# Patient Record
Sex: Female | Born: 1965 | Race: White | Marital: Married
Health system: Southern US, Community
[De-identification: ages and names within clinical notes are randomized; demographics above are authoritative.]

## PROBLEM LIST (undated history)

## (undated) ENCOUNTER — Inpatient Hospital Stay: Admission: EM | Payer: Self-pay | Source: Home / Self Care

## (undated) DIAGNOSIS — M1990 Unspecified osteoarthritis, unspecified site: Secondary | ICD-10-CM

## (undated) DIAGNOSIS — E0500 Thyrotoxicosis with diffuse goiter without thyrotoxic crisis or storm: Secondary | ICD-10-CM

## (undated) DIAGNOSIS — R112 Nausea with vomiting, unspecified: Secondary | ICD-10-CM

## (undated) DIAGNOSIS — F1120 Opioid dependence, uncomplicated: Secondary | ICD-10-CM

## (undated) DIAGNOSIS — G43909 Migraine, unspecified, not intractable, without status migrainosus: Secondary | ICD-10-CM

## (undated) DIAGNOSIS — G709 Myoneural disorder, unspecified: Secondary | ICD-10-CM

## (undated) DIAGNOSIS — F1910 Other psychoactive substance abuse, uncomplicated: Secondary | ICD-10-CM

## (undated) DIAGNOSIS — Z9889 Other specified postprocedural states: Secondary | ICD-10-CM

## (undated) DIAGNOSIS — F1190 Opioid use, unspecified, uncomplicated: Secondary | ICD-10-CM

## (undated) DIAGNOSIS — G8929 Other chronic pain: Secondary | ICD-10-CM

## (undated) DIAGNOSIS — F329 Major depressive disorder, single episode, unspecified: Secondary | ICD-10-CM

## (undated) DIAGNOSIS — C159 Malignant neoplasm of esophagus, unspecified: Secondary | ICD-10-CM

## (undated) DIAGNOSIS — K859 Acute pancreatitis without necrosis or infection, unspecified: Secondary | ICD-10-CM

## (undated) DIAGNOSIS — F32A Depression, unspecified: Secondary | ICD-10-CM

## (undated) DIAGNOSIS — G35 Multiple sclerosis: Secondary | ICD-10-CM

## (undated) DIAGNOSIS — F4310 Post-traumatic stress disorder, unspecified: Secondary | ICD-10-CM

## (undated) DIAGNOSIS — F419 Anxiety disorder, unspecified: Secondary | ICD-10-CM

## (undated) DIAGNOSIS — K589 Irritable bowel syndrome without diarrhea: Secondary | ICD-10-CM

## (undated) HISTORY — DX: Other psychoactive substance abuse, uncomplicated: F19.10

## (undated) HISTORY — PX: HERNIA REPAIR: SHX51

## (undated) HISTORY — DX: Myoneural disorder, unspecified: G70.9

## (undated) HISTORY — PX: CHOLECYSTECTOMY: SHX55

## (undated) HISTORY — PX: SPHINCTEROTOMY: SHX5279

## (undated) NOTE — Progress Notes (Signed)
Formatting of this note is different from the original.  [facility]     Referring Physician: [name]    Date of Consultation: 11/26/2018    Chief Complaint   Patient presents with   ? Follow-up     LOV 11/05/18. F/u to testing- Echo 11/21/2018, MoMe finished 11/25/2018. Pt is asymtomatic of CP.      Assessment and Plan     1. Precordial pain, atypical   Echocardiogram was negative for structural heart disease  Likely non cardiac , resolved   CT calcium score was ordered for risk stratification, not done   Consider stress test if symptoms worsen     2. Palpitations   .  Consider beta blockers/ ILR if symptoms escalate   .  Defer thyroid / obstructive sleep apnea workup to PCP  .  Holter monitor was negative for significant tachyarrhythmias     3. Obesity BMI >30  Encouraged weight loss and increased exercise.   Likely OSA ; respectfully deferred to PCP     4. HTN   Salt cessation discussed   Would advise lisinopril 10 mg   Goal SBP less than 130     5. Familial hyperlipidemia  Premature events in her father   LDL 181  ; no personal vascular events or tendon xanthomas  Goal LDL less than 70 ; likely familial hyperlipidemia  Homozygous     6. Labs   Chem 7 / lft with Dr Addison Bailey 1 month   Lipids with Dr Addison Bailey 3 months     Previous Cardiovascular Testing     Echocardiogram  11/21/18  1. Estimated ejection fraction 60-65%.  2. Normal right ventricular systolic function.  3. Trace to mild mitral regurgitation.  4. Trace to mild tricuspid regurgitation.  5. Normal RVSP    Holter 1/20   The patient was monitored by way of Mobile Cardiac Telemetry for a period of 7 days to assess Precordial Pain. Throughout the monitoring period the patient exhibited Sinus Rhythm. The patient activated the symptoms button 8 times, however, no symptoms diary was given. Symptoms correlated with Sinus Rhythm and Sinus Tachycardia. The minimum/average/maximum heart rates were 58/80/139 BPM. Total PVC Burden was  <1%.    History of Present Illness     59 yr old with considerable anxiety , HTN , atypical CP , LDL 181 obesity     Testing discussed at length     .  At baseline she has a reasonably good exercise tolerance,, however she is not as active as she was last year; NYHA functional class 1-11  With occasional shortness of breath    .  Recent ER visit with chest pain ; left-sided with some radiation down the arm and, nonexertional, pain lasts for hours ; s likely musculoskeletal pain    Father MI at 57 he was a smoker ; GM a fib     Never smoked     Weight : gained 10 lbs      EKG sinus rsr v1 v2 short pr no ST depressions    Chest PA 10/26/18:  No plain film evidence of acute cardiopulmonary disease.     Past Medical History     History reviewed. No pertinent past medical history.  Past Surgical History     History reviewed. No pertinent surgical history.  Social History     Social History     Tobacco Use   ? Smoking status: Never Smoker   ? Smokeless tobacco:  Never Used   Substance Use Topics   ? Alcohol use: Yes     Comment: socially   ? Drug use: Never     Family History     Family History   Problem Relation Age of Onset   ? Hypertension Mother    ? Hyperlipidemia Mother    ? Immunodeficiency Mother    ? Heart attack Father 50        & 44   ? Coronary artery disease Father    ? Hypertension Father    ? Coronary artery disease Maternal Grandfather    ? Heart attack Maternal Grandfather    ? Stroke Neg Hx      Allergies     No Known Allergies  Medications     Current Outpatient Medications   Medication Sig Dispense Refill   ? ALPRAZolam (XANAX) 0.5 mg tablet Take 0.5 mg by mouth as needed.     ? citalopram hydrobromide (CELEXA) 20 mg tablet Take 20 mg by mouth 2 (two) times daily.     ? Multiple Vitamin (MULTIVITAMIN) tablet Take 1 tablet by mouth daily.     ? lisinopril (PRINIVIL,ZESTRIL) 10 mg tablet Take one tablet (10 mg dose) by mouth daily. 30 tablet 5   ? rosuvastatin calcium (CRESTOR) 20 mg tablet Take one  tablet (20 mg dose) by mouth at bedtime. 30 tablet 5     No current facility-administered medications for this visit.      Review of Systems     General: Not Present- Fatigue, Weight Gain and Weight Loss.  Respiratory: Not Present- Bloody sputum, Cough, and Wheezing.  Cardiovascular: Not Present- Calf, thigh or buttock pain with walking, Chest Pain, Difficulty Breathing Lying Down, Difficulty Breathing On Exertion, Edema, Awakening Short of Breath and Palpitations.  Gastrointestinal: Not Present- Bloody Stool, Constipation, Diarrhea, Hematemesis, Indigestion, Nausea and Vomiting.  Neurological: Not Present- Dizziness, Syncope and Weakness.  Musculoskeletal:  No arthritic symptoms  Genitourinary: Negative for dysuria    Otherwise 10 point review of systems is negative.    Physical Exam     Vitals:    11/26/18 0959   BP: 140/90   BP Location: Left arm   Patient Position: Sitting   Pulse: 68   Weight: 203 lb (92.1 kg)   Height: 5\' 5"  (1.651 m)     Body mass index is 33.78 kg/m.    General:  Patient appears their stated age, well-nourished.  Alert and in no apparent distress.  Eyes: No conjunctivitis, no purulent discharge, no lid lag  ENT:  Hearing grossly intact, Nares patent bilaterally, Lips moist, color appropriate for race.  Respiratory: Clear to auscultation throughout. Respiratory effort unlabored, chest expansion symmetric.    Cardio: Regular rate and rhythm. Normal S1/S2 No carotid bruits, no JVD.   Extremities: warm, pulses 2+, no peripheral edema  GI: Soft, nondistended, nontender.  No guarding or rebound.  Skin: Color appropriate for race, Skin warm, dry, and intact  Psychiatric: Good insight and judgment, oriented to person, place, and time    Labs     Lipid Panel No results found for: CHOL, TRIG, HDL  CBC No results found for: WBC, HGB, HCT, MCV, PLT   BMP: No results found for: NA, K, CL, CO2, BUN, GLU  INR No results found for: INR, PROTIME     EKG     I have personally reviewed and interpreted the  resting electrocardiogram.     Follow-Up     Follow up in  about 1 year (around 11/27/2019).    Warmest Regards,  Higinio Roger, MD  [facility]  Electronically signed by Higinio Roger, MD at 11/26/2018 10:34 AM EST

---

## 1998-05-08 ENCOUNTER — Emergency Department (HOSPITAL_COMMUNITY): Admission: EM | Admit: 1998-05-08 | Discharge: 1998-05-08 | Payer: Self-pay | Admitting: Emergency Medicine

## 1998-06-17 ENCOUNTER — Ambulatory Visit (HOSPITAL_COMMUNITY): Admission: RE | Admit: 1998-06-17 | Discharge: 1998-06-17 | Payer: Self-pay | Admitting: Neurology

## 1998-10-26 ENCOUNTER — Ambulatory Visit (HOSPITAL_COMMUNITY): Admission: RE | Admit: 1998-10-26 | Discharge: 1998-10-26 | Payer: Self-pay | Admitting: Obstetrics and Gynecology

## 1998-10-26 ENCOUNTER — Emergency Department (HOSPITAL_COMMUNITY): Admission: EM | Admit: 1998-10-26 | Discharge: 1998-10-26 | Payer: Self-pay | Admitting: Emergency Medicine

## 1998-10-27 ENCOUNTER — Ambulatory Visit (HOSPITAL_COMMUNITY): Admission: RE | Admit: 1998-10-27 | Discharge: 1998-10-27 | Payer: Self-pay | Admitting: Emergency Medicine

## 1998-10-27 ENCOUNTER — Encounter: Payer: Self-pay | Admitting: Emergency Medicine

## 1998-11-12 ENCOUNTER — Ambulatory Visit (HOSPITAL_COMMUNITY): Admission: RE | Admit: 1998-11-12 | Discharge: 1998-11-12 | Payer: Self-pay | Admitting: Gastroenterology

## 1998-11-12 ENCOUNTER — Encounter: Payer: Self-pay | Admitting: Gastroenterology

## 1999-05-24 ENCOUNTER — Encounter: Admission: RE | Admit: 1999-05-24 | Discharge: 1999-08-22 | Payer: Self-pay

## 1999-10-07 ENCOUNTER — Encounter: Payer: Self-pay | Admitting: *Deleted

## 1999-10-07 ENCOUNTER — Ambulatory Visit (HOSPITAL_COMMUNITY): Admission: RE | Admit: 1999-10-07 | Discharge: 1999-10-07 | Payer: Self-pay | Admitting: *Deleted

## 1999-10-19 ENCOUNTER — Inpatient Hospital Stay (HOSPITAL_COMMUNITY): Admission: EM | Admit: 1999-10-19 | Discharge: 1999-10-21 | Payer: Self-pay | Admitting: Emergency Medicine

## 1999-10-19 ENCOUNTER — Encounter: Payer: Self-pay | Admitting: Emergency Medicine

## 1999-11-27 ENCOUNTER — Inpatient Hospital Stay
Admission: RE | Admit: 1999-11-27 | Disposition: A | Discharge status: Home / Self Care | Payer: Self-pay | Source: Ambulatory Visit | Admitting: Independent Medical Examiner

## 1999-12-17 ENCOUNTER — Emergency Department (HOSPITAL_COMMUNITY): Admission: EM | Admit: 1999-12-17 | Discharge: 1999-12-17 | Payer: Self-pay | Admitting: Emergency Medicine

## 2000-02-01 ENCOUNTER — Emergency Department (HOSPITAL_COMMUNITY): Admission: EM | Admit: 2000-02-01 | Discharge: 2000-02-01 | Payer: Self-pay | Admitting: Emergency Medicine

## 2000-02-01 ENCOUNTER — Encounter: Payer: Self-pay | Admitting: Emergency Medicine

## 2000-02-03 ENCOUNTER — Emergency Department (HOSPITAL_COMMUNITY): Admission: EM | Admit: 2000-02-03 | Discharge: 2000-02-03 | Payer: Self-pay | Admitting: Emergency Medicine

## 2000-02-03 ENCOUNTER — Encounter: Payer: Self-pay | Admitting: Emergency Medicine

## 2000-02-04 ENCOUNTER — Encounter: Payer: Self-pay | Admitting: Emergency Medicine

## 2000-02-04 ENCOUNTER — Inpatient Hospital Stay (HOSPITAL_COMMUNITY): Admission: EM | Admit: 2000-02-04 | Discharge: 2000-02-09 | Payer: Self-pay | Admitting: Emergency Medicine

## 2000-02-06 ENCOUNTER — Encounter: Payer: Self-pay | Admitting: Gastroenterology

## 2000-02-09 ENCOUNTER — Encounter: Payer: Self-pay | Admitting: Gastroenterology

## 2000-03-13 ENCOUNTER — Encounter: Payer: Self-pay | Admitting: *Deleted

## 2000-03-13 ENCOUNTER — Ambulatory Visit (HOSPITAL_COMMUNITY): Admission: RE | Admit: 2000-03-13 | Discharge: 2000-03-13 | Payer: Self-pay | Admitting: *Deleted

## 2000-03-30 ENCOUNTER — Observation Stay (HOSPITAL_COMMUNITY): Admission: EM | Admit: 2000-03-30 | Discharge: 2000-03-30 | Payer: Self-pay | Admitting: Specialist

## 2001-10-05 ENCOUNTER — Emergency Department (HOSPITAL_COMMUNITY): Admission: EM | Admit: 2001-10-05 | Discharge: 2001-10-06 | Payer: Self-pay | Admitting: *Deleted

## 2001-10-07 ENCOUNTER — Emergency Department (HOSPITAL_COMMUNITY): Admission: EM | Admit: 2001-10-07 | Discharge: 2001-10-07 | Payer: Self-pay | Admitting: Emergency Medicine

## 2001-10-08 ENCOUNTER — Encounter: Payer: Self-pay | Admitting: Emergency Medicine

## 2001-11-13 ENCOUNTER — Encounter: Payer: Self-pay | Admitting: Gastroenterology

## 2001-11-13 ENCOUNTER — Encounter: Admission: RE | Admit: 2001-11-13 | Discharge: 2001-11-13 | Payer: Self-pay | Admitting: Gastroenterology

## 2001-12-18 ENCOUNTER — Emergency Department (HOSPITAL_COMMUNITY): Admission: EM | Admit: 2001-12-18 | Discharge: 2001-12-18 | Payer: Self-pay

## 2002-10-15 ENCOUNTER — Inpatient Hospital Stay
Admission: RE | Admit: 2002-10-15 | Disposition: A | Discharge status: Home / Self Care | Payer: Self-pay | Source: Ambulatory Visit | Admitting: Independent Medical Examiner

## 2003-02-20 ENCOUNTER — Emergency Department (HOSPITAL_COMMUNITY): Admission: EM | Admit: 2003-02-20 | Discharge: 2003-02-20 | Payer: Self-pay

## 2004-02-05 ENCOUNTER — Emergency Department (HOSPITAL_COMMUNITY): Admission: AD | Admit: 2004-02-05 | Discharge: 2004-02-05 | Payer: Self-pay | Admitting: Family Medicine

## 2004-02-05 ENCOUNTER — Emergency Department (HOSPITAL_COMMUNITY): Admission: EM | Admit: 2004-02-05 | Discharge: 2004-02-05 | Payer: Self-pay | Admitting: Emergency Medicine

## 2004-02-28 ENCOUNTER — Emergency Department (HOSPITAL_COMMUNITY): Admission: EM | Admit: 2004-02-28 | Discharge: 2004-02-28 | Payer: Self-pay | Admitting: Emergency Medicine

## 2004-02-29 ENCOUNTER — Emergency Department (HOSPITAL_COMMUNITY): Admission: EM | Admit: 2004-02-29 | Discharge: 2004-02-29 | Payer: Self-pay | Admitting: *Deleted

## 2004-06-09 ENCOUNTER — Emergency Department (HOSPITAL_COMMUNITY): Admission: EM | Admit: 2004-06-09 | Discharge: 2004-06-10 | Payer: Self-pay | Admitting: Emergency Medicine

## 2005-04-18 ENCOUNTER — Emergency Department (HOSPITAL_COMMUNITY): Admission: EM | Admit: 2005-04-18 | Discharge: 2005-04-18 | Payer: Self-pay | Admitting: Family Medicine

## 2005-04-20 ENCOUNTER — Emergency Department (HOSPITAL_COMMUNITY): Admission: EM | Admit: 2005-04-20 | Discharge: 2005-04-20 | Payer: Self-pay | Admitting: Family Medicine

## 2005-04-22 ENCOUNTER — Emergency Department (HOSPITAL_COMMUNITY): Admission: EM | Admit: 2005-04-22 | Discharge: 2005-04-22 | Payer: Self-pay | Admitting: Emergency Medicine

## 2005-04-22 ENCOUNTER — Ambulatory Visit (HOSPITAL_COMMUNITY): Admission: RE | Admit: 2005-04-22 | Discharge: 2005-04-22 | Payer: Self-pay | Admitting: Family Medicine

## 2005-05-04 ENCOUNTER — Emergency Department (HOSPITAL_COMMUNITY): Admission: EM | Admit: 2005-05-04 | Discharge: 2005-05-04 | Payer: Self-pay | Admitting: Emergency Medicine

## 2005-05-13 ENCOUNTER — Emergency Department (HOSPITAL_COMMUNITY): Admission: EM | Admit: 2005-05-13 | Discharge: 2005-05-13 | Payer: Self-pay | Admitting: Emergency Medicine

## 2005-05-16 ENCOUNTER — Emergency Department (HOSPITAL_COMMUNITY): Admission: EM | Admit: 2005-05-16 | Discharge: 2005-05-17 | Payer: Self-pay | Admitting: Emergency Medicine

## 2005-05-22 ENCOUNTER — Emergency Department (HOSPITAL_COMMUNITY): Admission: EM | Admit: 2005-05-22 | Discharge: 2005-05-22 | Payer: Self-pay | Admitting: Family Medicine

## 2005-05-29 ENCOUNTER — Emergency Department (HOSPITAL_COMMUNITY): Admission: EM | Admit: 2005-05-29 | Discharge: 2005-05-29 | Payer: Self-pay | Admitting: Emergency Medicine

## 2005-06-12 ENCOUNTER — Emergency Department (HOSPITAL_COMMUNITY): Admission: EM | Admit: 2005-06-12 | Discharge: 2005-06-12 | Payer: Self-pay | Admitting: Family Medicine

## 2005-06-16 ENCOUNTER — Emergency Department (HOSPITAL_COMMUNITY): Admission: EM | Admit: 2005-06-16 | Discharge: 2005-06-16 | Payer: Self-pay | Admitting: Emergency Medicine

## 2005-06-20 ENCOUNTER — Emergency Department (HOSPITAL_COMMUNITY): Admission: EM | Admit: 2005-06-20 | Discharge: 2005-06-21 | Payer: Self-pay | Admitting: Emergency Medicine

## 2005-06-22 ENCOUNTER — Emergency Department (HOSPITAL_COMMUNITY): Admission: EM | Admit: 2005-06-22 | Discharge: 2005-06-22 | Payer: Self-pay | Admitting: Family Medicine

## 2005-06-26 ENCOUNTER — Encounter: Admission: RE | Admit: 2005-06-26 | Discharge: 2005-06-26 | Payer: Self-pay | Admitting: Gastroenterology

## 2005-06-30 ENCOUNTER — Emergency Department (HOSPITAL_COMMUNITY): Admission: EM | Admit: 2005-06-30 | Discharge: 2005-06-30 | Payer: Self-pay | Admitting: Emergency Medicine

## 2005-07-04 ENCOUNTER — Ambulatory Visit (HOSPITAL_COMMUNITY): Admission: RE | Admit: 2005-07-04 | Discharge: 2005-07-04 | Payer: Self-pay | Admitting: Gastroenterology

## 2005-07-09 ENCOUNTER — Inpatient Hospital Stay (HOSPITAL_COMMUNITY): Admission: EM | Admit: 2005-07-09 | Discharge: 2005-07-10 | Payer: Self-pay | Admitting: Family Medicine

## 2005-07-11 ENCOUNTER — Inpatient Hospital Stay (HOSPITAL_COMMUNITY): Admission: AD | Admit: 2005-07-11 | Discharge: 2005-07-22 | Payer: Self-pay | Admitting: Gastroenterology

## 2005-07-29 ENCOUNTER — Emergency Department (HOSPITAL_COMMUNITY): Admission: EM | Admit: 2005-07-29 | Discharge: 2005-07-29 | Payer: Self-pay | Admitting: Emergency Medicine

## 2005-08-02 ENCOUNTER — Encounter: Admission: RE | Admit: 2005-08-02 | Discharge: 2005-08-02 | Payer: Self-pay | Admitting: Gastroenterology

## 2005-08-07 ENCOUNTER — Emergency Department (HOSPITAL_COMMUNITY): Admission: EM | Admit: 2005-08-07 | Discharge: 2005-08-07 | Payer: Self-pay | Admitting: Family Medicine

## 2005-08-08 ENCOUNTER — Emergency Department (HOSPITAL_COMMUNITY): Admission: EM | Admit: 2005-08-08 | Discharge: 2005-08-09 | Payer: Self-pay | Admitting: Emergency Medicine

## 2005-09-08 ENCOUNTER — Emergency Department (HOSPITAL_COMMUNITY): Admission: EM | Admit: 2005-09-08 | Discharge: 2005-09-08 | Payer: Self-pay | Admitting: Family Medicine

## 2005-09-09 ENCOUNTER — Emergency Department (HOSPITAL_COMMUNITY): Admission: EM | Admit: 2005-09-09 | Discharge: 2005-09-09 | Payer: Self-pay | Admitting: Emergency Medicine

## 2005-10-05 ENCOUNTER — Emergency Department (HOSPITAL_COMMUNITY): Admission: EM | Admit: 2005-10-05 | Discharge: 2005-10-05 | Payer: Self-pay | Admitting: Family Medicine

## 2005-10-10 ENCOUNTER — Emergency Department (HOSPITAL_COMMUNITY): Admission: EM | Admit: 2005-10-10 | Discharge: 2005-10-10 | Payer: Self-pay | Admitting: Emergency Medicine

## 2005-11-03 ENCOUNTER — Emergency Department (HOSPITAL_COMMUNITY): Admission: EM | Admit: 2005-11-03 | Discharge: 2005-11-04 | Payer: Self-pay | Admitting: Emergency Medicine

## 2005-11-06 ENCOUNTER — Emergency Department (HOSPITAL_COMMUNITY): Admission: EM | Admit: 2005-11-06 | Discharge: 2005-11-07 | Payer: Self-pay | Admitting: Emergency Medicine

## 2005-12-15 ENCOUNTER — Encounter: Admission: RE | Admit: 2005-12-15 | Discharge: 2005-12-15 | Payer: Self-pay | Admitting: *Deleted

## 2005-12-29 ENCOUNTER — Encounter: Admission: RE | Admit: 2005-12-29 | Discharge: 2005-12-29 | Payer: Self-pay | Admitting: Internal Medicine

## 2006-02-04 ENCOUNTER — Emergency Department (HOSPITAL_COMMUNITY): Admission: EM | Admit: 2006-02-04 | Discharge: 2006-02-04 | Payer: Self-pay | Admitting: Family Medicine

## 2006-02-19 ENCOUNTER — Ambulatory Visit (HOSPITAL_COMMUNITY): Admission: RE | Admit: 2006-02-19 | Discharge: 2006-02-19 | Payer: Self-pay | Admitting: Anesthesiology

## 2006-02-22 ENCOUNTER — Emergency Department (HOSPITAL_COMMUNITY): Admission: EM | Admit: 2006-02-22 | Discharge: 2006-02-22 | Payer: Self-pay | Admitting: Emergency Medicine

## 2006-02-28 ENCOUNTER — Emergency Department (HOSPITAL_COMMUNITY): Admission: EM | Admit: 2006-02-28 | Discharge: 2006-03-01 | Payer: Self-pay | Admitting: Emergency Medicine

## 2006-03-03 ENCOUNTER — Emergency Department (HOSPITAL_COMMUNITY): Admission: EM | Admit: 2006-03-03 | Discharge: 2006-03-04 | Payer: Self-pay | Admitting: Emergency Medicine

## 2006-03-05 ENCOUNTER — Inpatient Hospital Stay (HOSPITAL_COMMUNITY): Admission: EM | Admit: 2006-03-05 | Discharge: 2006-03-08 | Payer: Self-pay | Admitting: Emergency Medicine

## 2006-03-25 ENCOUNTER — Emergency Department (HOSPITAL_COMMUNITY): Admission: EM | Admit: 2006-03-25 | Discharge: 2006-03-25 | Payer: Self-pay | Admitting: Family Medicine

## 2006-03-27 ENCOUNTER — Emergency Department (HOSPITAL_COMMUNITY): Admission: EM | Admit: 2006-03-27 | Discharge: 2006-03-27 | Payer: Self-pay | Admitting: Family Medicine

## 2006-04-13 ENCOUNTER — Emergency Department (HOSPITAL_COMMUNITY): Admission: EM | Admit: 2006-04-13 | Discharge: 2006-04-14 | Payer: Self-pay | Admitting: Emergency Medicine

## 2006-06-11 ENCOUNTER — Other Ambulatory Visit: Admission: RE | Admit: 2006-06-11 | Discharge: 2006-06-11 | Payer: Self-pay | Admitting: Obstetrics and Gynecology

## 2006-07-28 ENCOUNTER — Emergency Department (HOSPITAL_COMMUNITY): Admission: EM | Admit: 2006-07-28 | Discharge: 2006-07-28 | Payer: Self-pay | Admitting: Family Medicine

## 2006-07-28 ENCOUNTER — Emergency Department (HOSPITAL_COMMUNITY): Admission: EM | Admit: 2006-07-28 | Discharge: 2006-07-29 | Payer: Self-pay | Admitting: Emergency Medicine

## 2006-07-29 ENCOUNTER — Emergency Department (HOSPITAL_COMMUNITY): Admission: EM | Admit: 2006-07-29 | Discharge: 2006-07-30 | Payer: Self-pay | Admitting: Emergency Medicine

## 2006-07-29 ENCOUNTER — Emergency Department (HOSPITAL_COMMUNITY): Admission: EM | Admit: 2006-07-29 | Discharge: 2006-07-29 | Payer: Self-pay | Admitting: Family Medicine

## 2006-09-10 ENCOUNTER — Emergency Department (HOSPITAL_COMMUNITY): Admission: EM | Admit: 2006-09-10 | Discharge: 2006-09-10 | Payer: Self-pay | Admitting: Emergency Medicine

## 2006-11-26 ENCOUNTER — Emergency Department (HOSPITAL_COMMUNITY): Admission: EM | Admit: 2006-11-26 | Discharge: 2006-11-26 | Payer: Self-pay | Admitting: Emergency Medicine

## 2006-12-08 ENCOUNTER — Emergency Department (HOSPITAL_COMMUNITY): Admission: EM | Admit: 2006-12-08 | Discharge: 2006-12-08 | Payer: Self-pay | Admitting: Emergency Medicine

## 2006-12-08 ENCOUNTER — Emergency Department (HOSPITAL_COMMUNITY): Admission: EM | Admit: 2006-12-08 | Discharge: 2006-12-09 | Payer: Self-pay | Admitting: Emergency Medicine

## 2006-12-28 ENCOUNTER — Emergency Department (HOSPITAL_COMMUNITY): Admission: EM | Admit: 2006-12-28 | Discharge: 2006-12-28 | Payer: Self-pay | Admitting: Emergency Medicine

## 2007-01-05 ENCOUNTER — Emergency Department (HOSPITAL_COMMUNITY): Admission: EM | Admit: 2007-01-05 | Discharge: 2007-01-05 | Payer: Self-pay | Admitting: Emergency Medicine

## 2007-01-09 ENCOUNTER — Inpatient Hospital Stay (HOSPITAL_COMMUNITY): Admission: AD | Admit: 2007-01-09 | Discharge: 2007-01-16 | Payer: Self-pay | Admitting: Gastroenterology

## 2007-01-21 ENCOUNTER — Ambulatory Visit: Payer: Self-pay | Admitting: Internal Medicine

## 2007-02-19 ENCOUNTER — Emergency Department (HOSPITAL_COMMUNITY): Admission: EM | Admit: 2007-02-19 | Discharge: 2007-02-20 | Payer: Self-pay | Admitting: Emergency Medicine

## 2007-03-12 ENCOUNTER — Ambulatory Visit (HOSPITAL_COMMUNITY): Admission: RE | Admit: 2007-03-12 | Discharge: 2007-03-12 | Payer: Self-pay | Admitting: Obstetrics and Gynecology

## 2007-04-18 ENCOUNTER — Emergency Department (HOSPITAL_COMMUNITY): Admission: EM | Admit: 2007-04-18 | Discharge: 2007-04-18 | Payer: Self-pay | Admitting: Emergency Medicine

## 2007-06-13 ENCOUNTER — Ambulatory Visit (HOSPITAL_COMMUNITY): Admission: RE | Admit: 2007-06-13 | Discharge: 2007-06-13 | Payer: Self-pay | Admitting: Obstetrics and Gynecology

## 2007-06-18 ENCOUNTER — Emergency Department (HOSPITAL_COMMUNITY): Admission: EM | Admit: 2007-06-18 | Discharge: 2007-06-19 | Payer: Self-pay | Admitting: Emergency Medicine

## 2007-06-20 ENCOUNTER — Emergency Department (HOSPITAL_COMMUNITY): Admission: EM | Admit: 2007-06-20 | Discharge: 2007-06-21 | Payer: Self-pay | Admitting: Emergency Medicine

## 2007-06-26 ENCOUNTER — Emergency Department (HOSPITAL_COMMUNITY): Admission: EM | Admit: 2007-06-26 | Discharge: 2007-06-26 | Payer: Self-pay | Admitting: Emergency Medicine

## 2007-07-15 ENCOUNTER — Emergency Department (HOSPITAL_COMMUNITY): Admission: EM | Admit: 2007-07-15 | Discharge: 2007-07-16 | Payer: Self-pay | Admitting: Emergency Medicine

## 2007-07-18 ENCOUNTER — Emergency Department (HOSPITAL_COMMUNITY): Admission: EM | Admit: 2007-07-18 | Discharge: 2007-07-19 | Payer: Self-pay | Admitting: Emergency Medicine

## 2007-08-06 ENCOUNTER — Emergency Department (HOSPITAL_COMMUNITY): Admission: EM | Admit: 2007-08-06 | Discharge: 2007-08-07 | Payer: Self-pay | Admitting: Emergency Medicine

## 2007-09-16 ENCOUNTER — Encounter: Admission: RE | Admit: 2007-09-16 | Discharge: 2007-10-18 | Payer: Self-pay | Admitting: Orthopedic Surgery

## 2007-09-23 ENCOUNTER — Emergency Department (HOSPITAL_COMMUNITY): Admission: EM | Admit: 2007-09-23 | Discharge: 2007-09-23 | Payer: Self-pay | Admitting: Emergency Medicine

## 2007-09-27 ENCOUNTER — Emergency Department (HOSPITAL_COMMUNITY): Admission: EM | Admit: 2007-09-27 | Discharge: 2007-09-27 | Payer: Self-pay | Admitting: Emergency Medicine

## 2007-11-30 ENCOUNTER — Emergency Department (HOSPITAL_COMMUNITY): Admission: EM | Admit: 2007-11-30 | Discharge: 2007-11-30 | Payer: Self-pay | Admitting: General Surgery

## 2007-12-20 ENCOUNTER — Inpatient Hospital Stay (HOSPITAL_COMMUNITY): Admission: EM | Admit: 2007-12-20 | Discharge: 2007-12-20 | Payer: Self-pay | Admitting: Internal Medicine

## 2007-12-22 ENCOUNTER — Emergency Department (HOSPITAL_COMMUNITY): Admission: EM | Admit: 2007-12-22 | Discharge: 2007-12-22 | Payer: Self-pay | Admitting: Emergency Medicine

## 2007-12-26 ENCOUNTER — Emergency Department (HOSPITAL_COMMUNITY): Admission: EM | Admit: 2007-12-26 | Discharge: 2007-12-26 | Payer: Self-pay | Admitting: Emergency Medicine

## 2008-03-31 ENCOUNTER — Emergency Department (HOSPITAL_COMMUNITY): Admission: EM | Admit: 2008-03-31 | Discharge: 2008-03-31 | Payer: Self-pay | Admitting: Emergency Medicine

## 2008-05-04 ENCOUNTER — Emergency Department (HOSPITAL_COMMUNITY): Admission: EM | Admit: 2008-05-04 | Discharge: 2008-05-04 | Payer: Self-pay | Admitting: Family Medicine

## 2008-05-05 ENCOUNTER — Emergency Department (HOSPITAL_COMMUNITY): Admission: EM | Admit: 2008-05-05 | Discharge: 2008-05-05 | Payer: Self-pay | Admitting: Internal Medicine

## 2008-05-10 ENCOUNTER — Emergency Department (HOSPITAL_COMMUNITY): Admission: EM | Admit: 2008-05-10 | Discharge: 2008-05-10 | Payer: Self-pay | Admitting: Emergency Medicine

## 2008-08-24 ENCOUNTER — Emergency Department (HOSPITAL_COMMUNITY): Admission: EM | Admit: 2008-08-24 | Discharge: 2008-08-24 | Payer: Self-pay | Admitting: Emergency Medicine

## 2008-11-02 ENCOUNTER — Emergency Department (HOSPITAL_COMMUNITY): Admission: EM | Admit: 2008-11-02 | Discharge: 2008-11-02 | Payer: Self-pay | Admitting: Emergency Medicine

## 2008-11-04 ENCOUNTER — Emergency Department (HOSPITAL_COMMUNITY): Admission: EM | Admit: 2008-11-04 | Discharge: 2008-11-05 | Payer: Self-pay | Admitting: Emergency Medicine

## 2008-11-07 ENCOUNTER — Emergency Department (HOSPITAL_COMMUNITY): Admission: EM | Admit: 2008-11-07 | Discharge: 2008-11-07 | Payer: Self-pay | Admitting: Psychology

## 2008-11-30 ENCOUNTER — Encounter: Admission: RE | Admit: 2008-11-30 | Discharge: 2009-01-01 | Payer: Self-pay | Admitting: Anesthesiology

## 2009-03-09 ENCOUNTER — Emergency Department (HOSPITAL_COMMUNITY): Admission: EM | Admit: 2009-03-09 | Discharge: 2009-03-09 | Payer: Self-pay | Admitting: Emergency Medicine

## 2009-03-10 ENCOUNTER — Emergency Department (HOSPITAL_COMMUNITY): Admission: EM | Admit: 2009-03-10 | Discharge: 2009-03-10 | Payer: Self-pay | Admitting: Emergency Medicine

## 2009-05-06 ENCOUNTER — Emergency Department (HOSPITAL_COMMUNITY): Admission: EM | Admit: 2009-05-06 | Discharge: 2009-05-06 | Payer: Self-pay | Admitting: Emergency Medicine

## 2009-07-04 ENCOUNTER — Emergency Department (HOSPITAL_COMMUNITY): Admission: EM | Admit: 2009-07-04 | Discharge: 2009-07-04 | Payer: Self-pay | Admitting: Emergency Medicine

## 2009-08-25 ENCOUNTER — Ambulatory Visit (HOSPITAL_COMMUNITY): Admission: RE | Admit: 2009-08-25 | Discharge: 2009-08-25 | Payer: Self-pay | Admitting: Obstetrics

## 2009-09-12 ENCOUNTER — Emergency Department (HOSPITAL_COMMUNITY): Admission: EM | Admit: 2009-09-12 | Discharge: 2009-09-13 | Payer: Self-pay | Admitting: Emergency Medicine

## 2009-09-15 ENCOUNTER — Inpatient Hospital Stay (HOSPITAL_COMMUNITY): Admission: EM | Admit: 2009-09-15 | Discharge: 2009-09-19 | Payer: Self-pay | Admitting: Emergency Medicine

## 2009-09-28 ENCOUNTER — Observation Stay (HOSPITAL_COMMUNITY): Admission: EM | Admit: 2009-09-28 | Discharge: 2009-09-28 | Payer: Self-pay | Admitting: Emergency Medicine

## 2009-10-07 ENCOUNTER — Emergency Department (HOSPITAL_COMMUNITY): Admission: EM | Admit: 2009-10-07 | Discharge: 2009-10-07 | Payer: Self-pay | Admitting: Emergency Medicine

## 2009-10-08 ENCOUNTER — Emergency Department (HOSPITAL_COMMUNITY): Admission: EM | Admit: 2009-10-08 | Discharge: 2009-10-09 | Payer: Self-pay | Admitting: Emergency Medicine

## 2009-10-18 ENCOUNTER — Emergency Department (HOSPITAL_COMMUNITY): Admission: EM | Admit: 2009-10-18 | Discharge: 2009-10-18 | Payer: Self-pay | Admitting: Emergency Medicine

## 2009-10-21 ENCOUNTER — Observation Stay (HOSPITAL_COMMUNITY): Admission: EM | Admit: 2009-10-21 | Discharge: 2009-10-23 | Payer: Self-pay | Admitting: Emergency Medicine

## 2009-10-21 ENCOUNTER — Ambulatory Visit: Payer: Self-pay | Admitting: Internal Medicine

## 2009-10-26 ENCOUNTER — Emergency Department (HOSPITAL_COMMUNITY): Admission: EM | Admit: 2009-10-26 | Discharge: 2009-10-26 | Payer: Self-pay | Admitting: Emergency Medicine

## 2009-11-07 ENCOUNTER — Emergency Department (HOSPITAL_COMMUNITY): Admission: EM | Admit: 2009-11-07 | Discharge: 2009-11-07 | Payer: Self-pay | Admitting: Emergency Medicine

## 2009-11-08 ENCOUNTER — Emergency Department (HOSPITAL_COMMUNITY): Admission: EM | Admit: 2009-11-08 | Discharge: 2009-11-08 | Payer: Self-pay | Admitting: Emergency Medicine

## 2009-11-10 ENCOUNTER — Emergency Department (HOSPITAL_COMMUNITY): Admission: EM | Admit: 2009-11-10 | Discharge: 2009-11-10 | Payer: Self-pay | Admitting: Emergency Medicine

## 2009-11-13 ENCOUNTER — Encounter: Payer: Self-pay | Admitting: Internal Medicine

## 2009-11-13 ENCOUNTER — Inpatient Hospital Stay (HOSPITAL_COMMUNITY): Admission: RE | Admit: 2009-11-13 | Discharge: 2009-11-20 | Payer: Self-pay | Admitting: Psychiatry

## 2009-11-14 ENCOUNTER — Ambulatory Visit: Payer: Self-pay | Admitting: Psychiatry

## 2009-11-17 ENCOUNTER — Encounter: Payer: Self-pay | Admitting: Psychiatry

## 2009-12-17 ENCOUNTER — Observation Stay (HOSPITAL_COMMUNITY): Admission: EM | Admit: 2009-12-17 | Discharge: 2009-12-17 | Payer: Self-pay | Admitting: Emergency Medicine

## 2010-01-06 ENCOUNTER — Emergency Department (HOSPITAL_COMMUNITY): Admission: EM | Admit: 2010-01-06 | Discharge: 2010-01-07 | Payer: Self-pay | Admitting: Emergency Medicine

## 2010-01-14 ENCOUNTER — Emergency Department (HOSPITAL_COMMUNITY): Admission: EM | Admit: 2010-01-14 | Discharge: 2010-01-15 | Payer: Self-pay | Admitting: Emergency Medicine

## 2010-01-16 ENCOUNTER — Emergency Department (HOSPITAL_COMMUNITY): Admission: EM | Admit: 2010-01-16 | Discharge: 2010-01-17 | Payer: Self-pay | Admitting: Emergency Medicine

## 2010-01-19 ENCOUNTER — Emergency Department (HOSPITAL_COMMUNITY): Admission: EM | Admit: 2010-01-19 | Discharge: 2010-01-19 | Payer: Self-pay | Admitting: Emergency Medicine

## 2010-02-03 ENCOUNTER — Emergency Department (HOSPITAL_COMMUNITY): Admission: EM | Admit: 2010-02-03 | Discharge: 2010-02-03 | Payer: Self-pay | Admitting: Emergency Medicine

## 2010-02-08 ENCOUNTER — Emergency Department (HOSPITAL_COMMUNITY): Admission: EM | Admit: 2010-02-08 | Discharge: 2010-02-09 | Payer: Self-pay | Admitting: Emergency Medicine

## 2010-03-02 ENCOUNTER — Emergency Department (HOSPITAL_COMMUNITY): Admission: EM | Admit: 2010-03-02 | Discharge: 2010-03-02 | Payer: Self-pay | Admitting: Emergency Medicine

## 2010-03-03 ENCOUNTER — Emergency Department (HOSPITAL_COMMUNITY): Admission: EM | Admit: 2010-03-03 | Discharge: 2010-03-04 | Payer: Self-pay | Admitting: Emergency Medicine

## 2010-03-04 ENCOUNTER — Emergency Department (HOSPITAL_COMMUNITY): Admission: EM | Admit: 2010-03-04 | Discharge: 2010-03-04 | Payer: Self-pay | Admitting: Emergency Medicine

## 2010-03-10 ENCOUNTER — Emergency Department (HOSPITAL_COMMUNITY): Admission: EM | Admit: 2010-03-10 | Discharge: 2010-03-10 | Payer: Self-pay | Admitting: Emergency Medicine

## 2010-03-14 ENCOUNTER — Emergency Department (HOSPITAL_COMMUNITY): Admission: EM | Admit: 2010-03-14 | Discharge: 2010-03-14 | Payer: Self-pay | Admitting: Emergency Medicine

## 2010-03-15 ENCOUNTER — Emergency Department (HOSPITAL_COMMUNITY): Admission: EM | Admit: 2010-03-15 | Discharge: 2010-03-15 | Payer: Self-pay | Admitting: Emergency Medicine

## 2010-04-02 ENCOUNTER — Emergency Department (HOSPITAL_COMMUNITY): Admission: EM | Admit: 2010-04-02 | Discharge: 2010-04-02 | Payer: Self-pay | Admitting: Emergency Medicine

## 2010-04-03 ENCOUNTER — Emergency Department (HOSPITAL_COMMUNITY): Admission: EM | Admit: 2010-04-03 | Discharge: 2010-04-04 | Payer: Self-pay | Admitting: Emergency Medicine

## 2010-04-04 ENCOUNTER — Emergency Department (HOSPITAL_COMMUNITY): Admission: EM | Admit: 2010-04-04 | Discharge: 2010-04-04 | Payer: Self-pay | Admitting: Emergency Medicine

## 2010-04-06 ENCOUNTER — Emergency Department (HOSPITAL_COMMUNITY): Admission: EM | Admit: 2010-04-06 | Discharge: 2010-04-06 | Payer: Self-pay | Admitting: Emergency Medicine

## 2010-04-07 ENCOUNTER — Inpatient Hospital Stay (HOSPITAL_COMMUNITY): Admission: EM | Admit: 2010-04-07 | Discharge: 2010-04-11 | Payer: Self-pay | Admitting: Gastroenterology

## 2010-04-07 ENCOUNTER — Emergency Department (HOSPITAL_COMMUNITY): Admission: EM | Admit: 2010-04-07 | Discharge: 2010-04-07 | Payer: Self-pay | Admitting: Emergency Medicine

## 2010-04-09 ENCOUNTER — Ambulatory Visit: Payer: Self-pay | Admitting: Gastroenterology

## 2010-04-27 ENCOUNTER — Emergency Department (HOSPITAL_COMMUNITY): Admission: EM | Admit: 2010-04-27 | Discharge: 2010-04-27 | Payer: Self-pay | Admitting: Emergency Medicine

## 2010-04-30 ENCOUNTER — Emergency Department (HOSPITAL_COMMUNITY): Admission: EM | Admit: 2010-04-30 | Discharge: 2010-04-30 | Payer: Self-pay | Admitting: Emergency Medicine

## 2010-05-01 ENCOUNTER — Emergency Department (HOSPITAL_COMMUNITY): Admission: EM | Admit: 2010-05-01 | Discharge: 2010-05-01 | Payer: Self-pay | Admitting: Emergency Medicine

## 2010-05-30 ENCOUNTER — Observation Stay (HOSPITAL_COMMUNITY): Admission: EM | Admit: 2010-05-30 | Discharge: 2010-05-31 | Payer: Self-pay | Admitting: Emergency Medicine

## 2010-05-30 ENCOUNTER — Emergency Department (HOSPITAL_COMMUNITY): Admission: EM | Admit: 2010-05-30 | Discharge: 2010-05-31 | Payer: Self-pay | Admitting: Emergency Medicine

## 2010-05-31 ENCOUNTER — Emergency Department (HOSPITAL_COMMUNITY): Admission: EM | Admit: 2010-05-31 | Discharge: 2010-05-31 | Payer: Self-pay | Admitting: Emergency Medicine

## 2010-06-02 ENCOUNTER — Emergency Department (HOSPITAL_COMMUNITY): Admission: EM | Admit: 2010-06-02 | Discharge: 2010-06-02 | Payer: Self-pay | Admitting: Emergency Medicine

## 2010-06-24 ENCOUNTER — Other Ambulatory Visit: Payer: Self-pay

## 2010-06-24 ENCOUNTER — Ambulatory Visit: Payer: Self-pay | Admitting: Psychiatry

## 2010-06-24 ENCOUNTER — Ambulatory Visit (HOSPITAL_COMMUNITY): Admission: RE | Admit: 2010-06-24 | Discharge: 2010-06-24 | Payer: Self-pay | Admitting: Psychiatry

## 2010-06-25 ENCOUNTER — Inpatient Hospital Stay (HOSPITAL_COMMUNITY): Admission: AD | Admit: 2010-06-25 | Discharge: 2010-06-29 | Payer: Self-pay | Admitting: Psychiatry

## 2010-06-26 ENCOUNTER — Other Ambulatory Visit: Payer: Self-pay | Admitting: Emergency Medicine

## 2010-07-01 ENCOUNTER — Emergency Department (HOSPITAL_COMMUNITY): Admission: EM | Admit: 2010-07-01 | Discharge: 2010-07-02 | Payer: Self-pay | Admitting: Emergency Medicine

## 2010-07-01 ENCOUNTER — Emergency Department (HOSPITAL_COMMUNITY): Admission: EM | Admit: 2010-07-01 | Discharge: 2010-07-01 | Payer: Self-pay | Admitting: Emergency Medicine

## 2010-07-06 ENCOUNTER — Emergency Department (HOSPITAL_COMMUNITY): Admission: EM | Admit: 2010-07-06 | Discharge: 2010-07-07 | Payer: Self-pay | Admitting: Emergency Medicine

## 2010-07-15 ENCOUNTER — Emergency Department (HOSPITAL_COMMUNITY): Admission: EM | Admit: 2010-07-15 | Discharge: 2010-07-15 | Payer: Self-pay | Admitting: Emergency Medicine

## 2010-07-18 ENCOUNTER — Emergency Department (HOSPITAL_COMMUNITY): Admission: EM | Admit: 2010-07-18 | Discharge: 2010-07-19 | Payer: Self-pay | Admitting: Emergency Medicine

## 2010-08-02 ENCOUNTER — Emergency Department (HOSPITAL_COMMUNITY): Admission: EM | Admit: 2010-08-02 | Discharge: 2010-08-02 | Payer: Self-pay | Admitting: Emergency Medicine

## 2010-08-12 ENCOUNTER — Emergency Department (HOSPITAL_COMMUNITY): Admission: EM | Admit: 2010-08-12 | Discharge: 2010-08-12 | Payer: Self-pay | Admitting: Family Medicine

## 2010-08-17 ENCOUNTER — Emergency Department (HOSPITAL_COMMUNITY): Admission: EM | Admit: 2010-08-17 | Discharge: 2010-08-17 | Payer: Self-pay | Admitting: Family Medicine

## 2010-08-25 ENCOUNTER — Ambulatory Visit: Payer: Self-pay | Admitting: Cardiovascular Disease

## 2010-08-25 ENCOUNTER — Emergency Department (HOSPITAL_COMMUNITY): Admission: EM | Admit: 2010-08-25 | Discharge: 2010-08-25 | Payer: Self-pay | Admitting: Emergency Medicine

## 2010-08-25 ENCOUNTER — Observation Stay (HOSPITAL_COMMUNITY): Admission: EM | Admit: 2010-08-25 | Discharge: 2010-08-27 | Payer: Self-pay | Admitting: Emergency Medicine

## 2010-08-26 ENCOUNTER — Encounter (INDEPENDENT_AMBULATORY_CARE_PROVIDER_SITE_OTHER): Payer: Self-pay | Admitting: Obstetrics & Gynecology

## 2010-09-27 ENCOUNTER — Encounter: Admission: RE | Admit: 2010-09-27 | Discharge: 2010-09-27 | Payer: Self-pay | Admitting: Internal Medicine

## 2010-09-29 IMAGING — CR DG KNEE COMPLETE 4+V*L*
4 series · 4 of 4 positions shown · non-contrast
Comparison: Left knee 02/19/2006.

CLINICAL DATA: Fall, pain.

LEFT KNEE - COMPLETE 4+ VIEW

[t knee ap left]
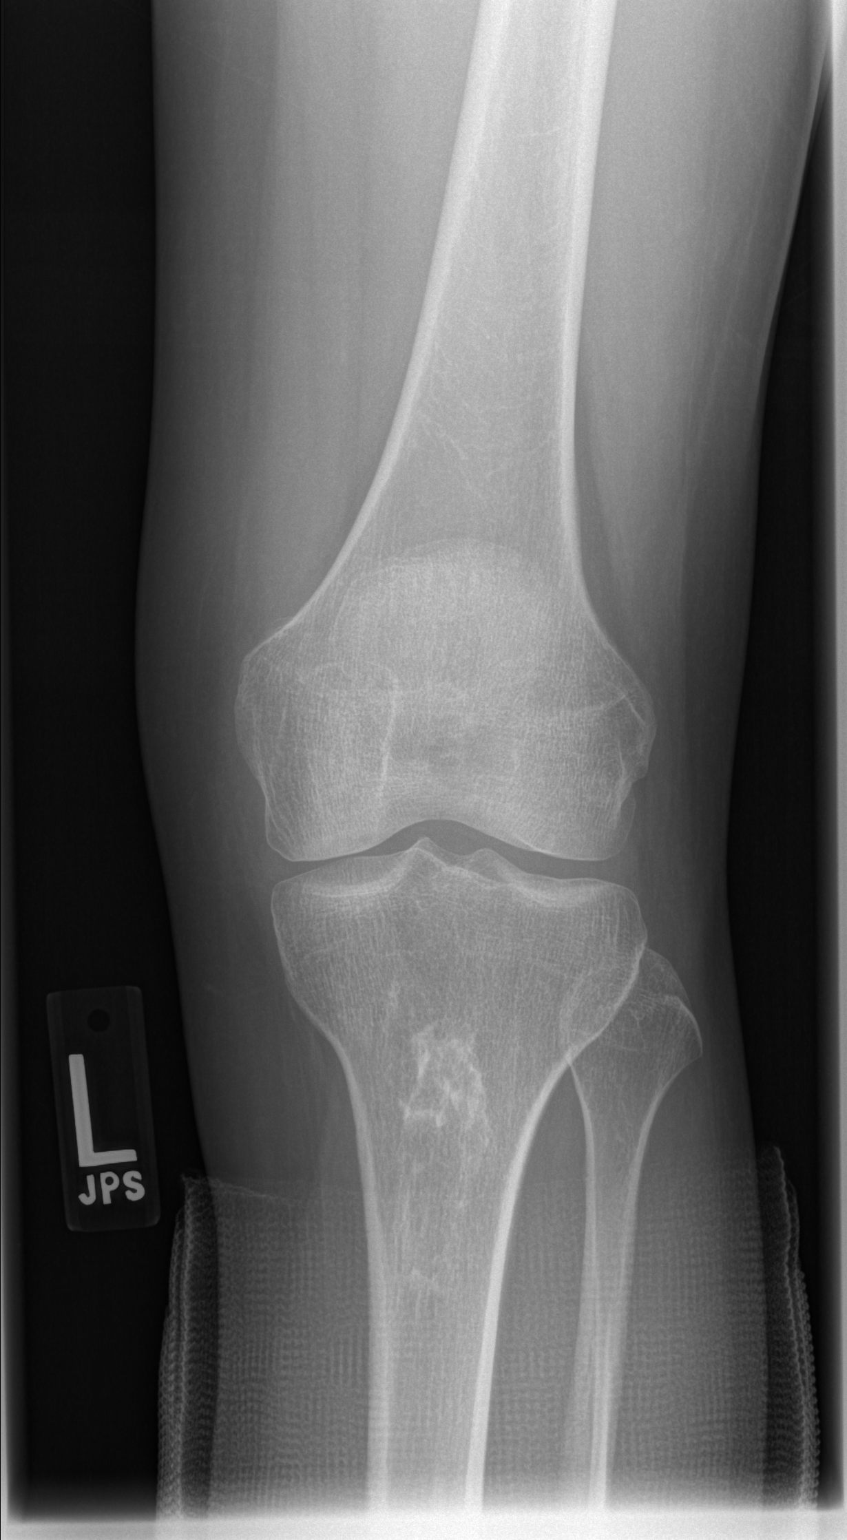

[t knee oblique left (1 of 2)]
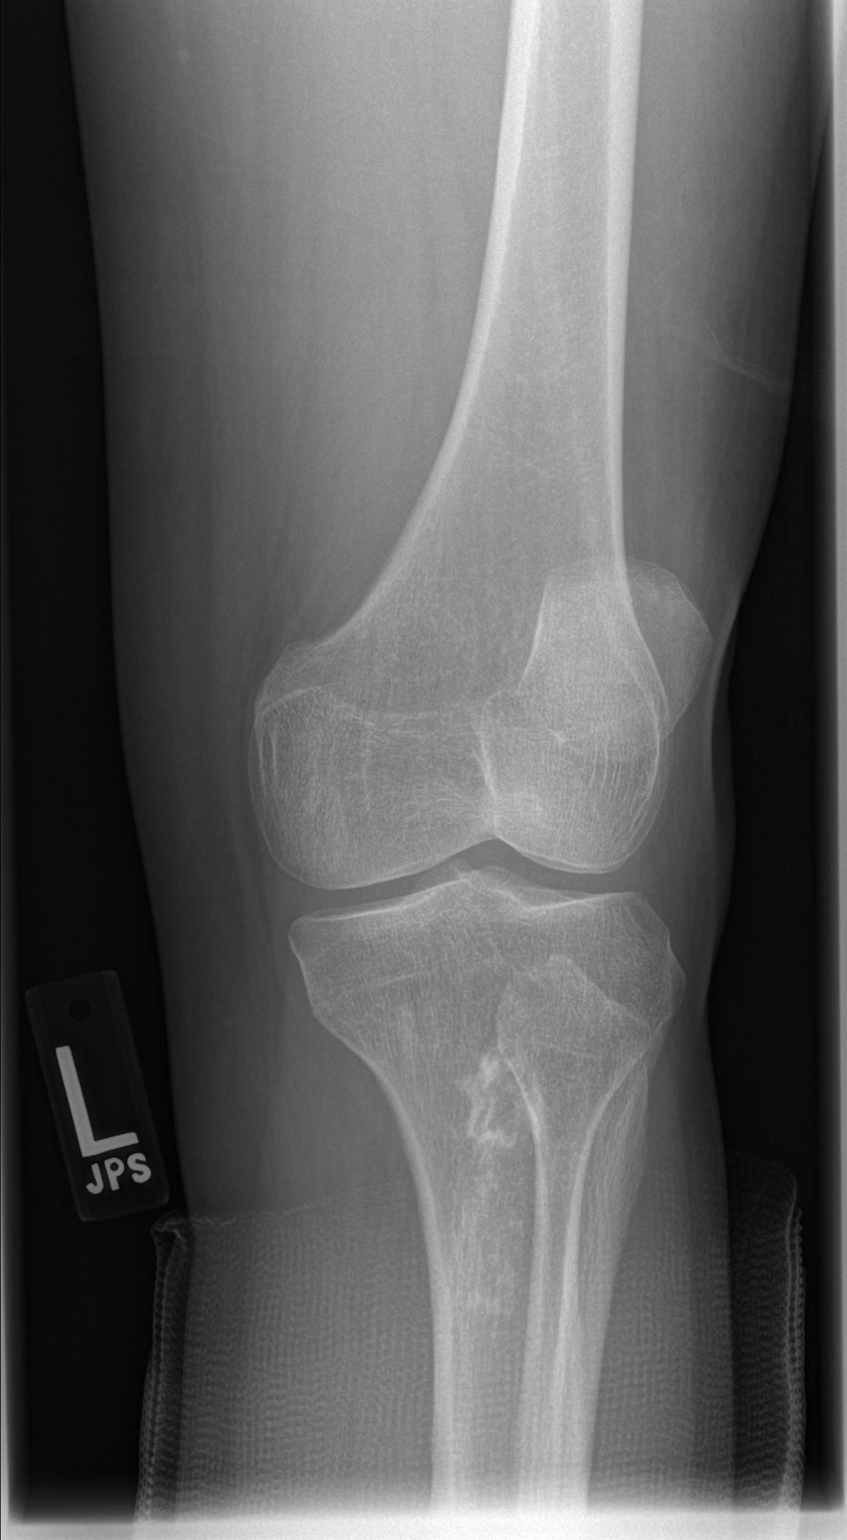

[t knee oblique left (2 of 2)]
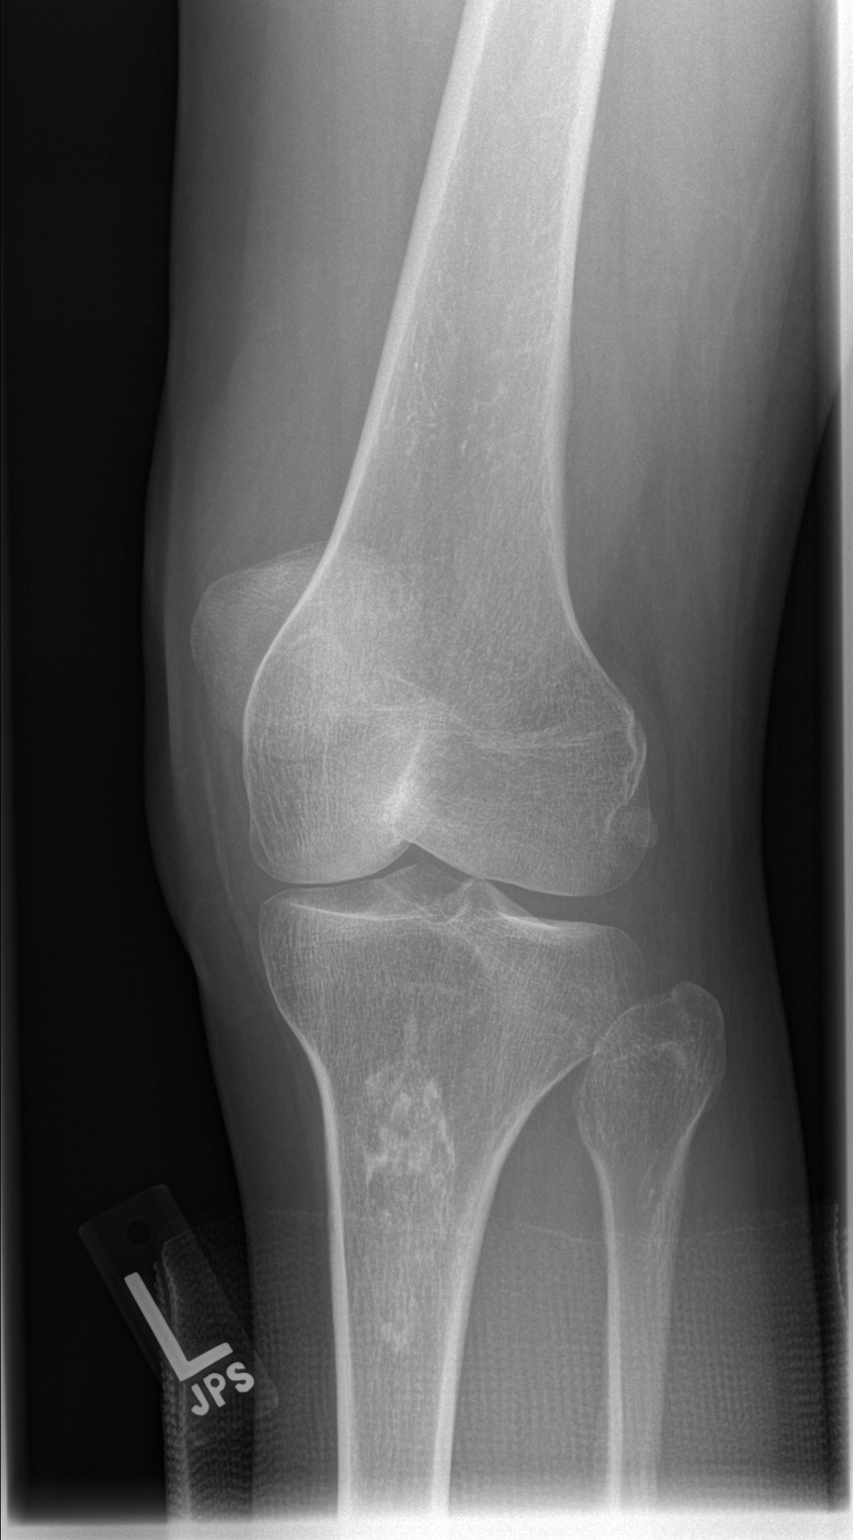

[t knee lat left]
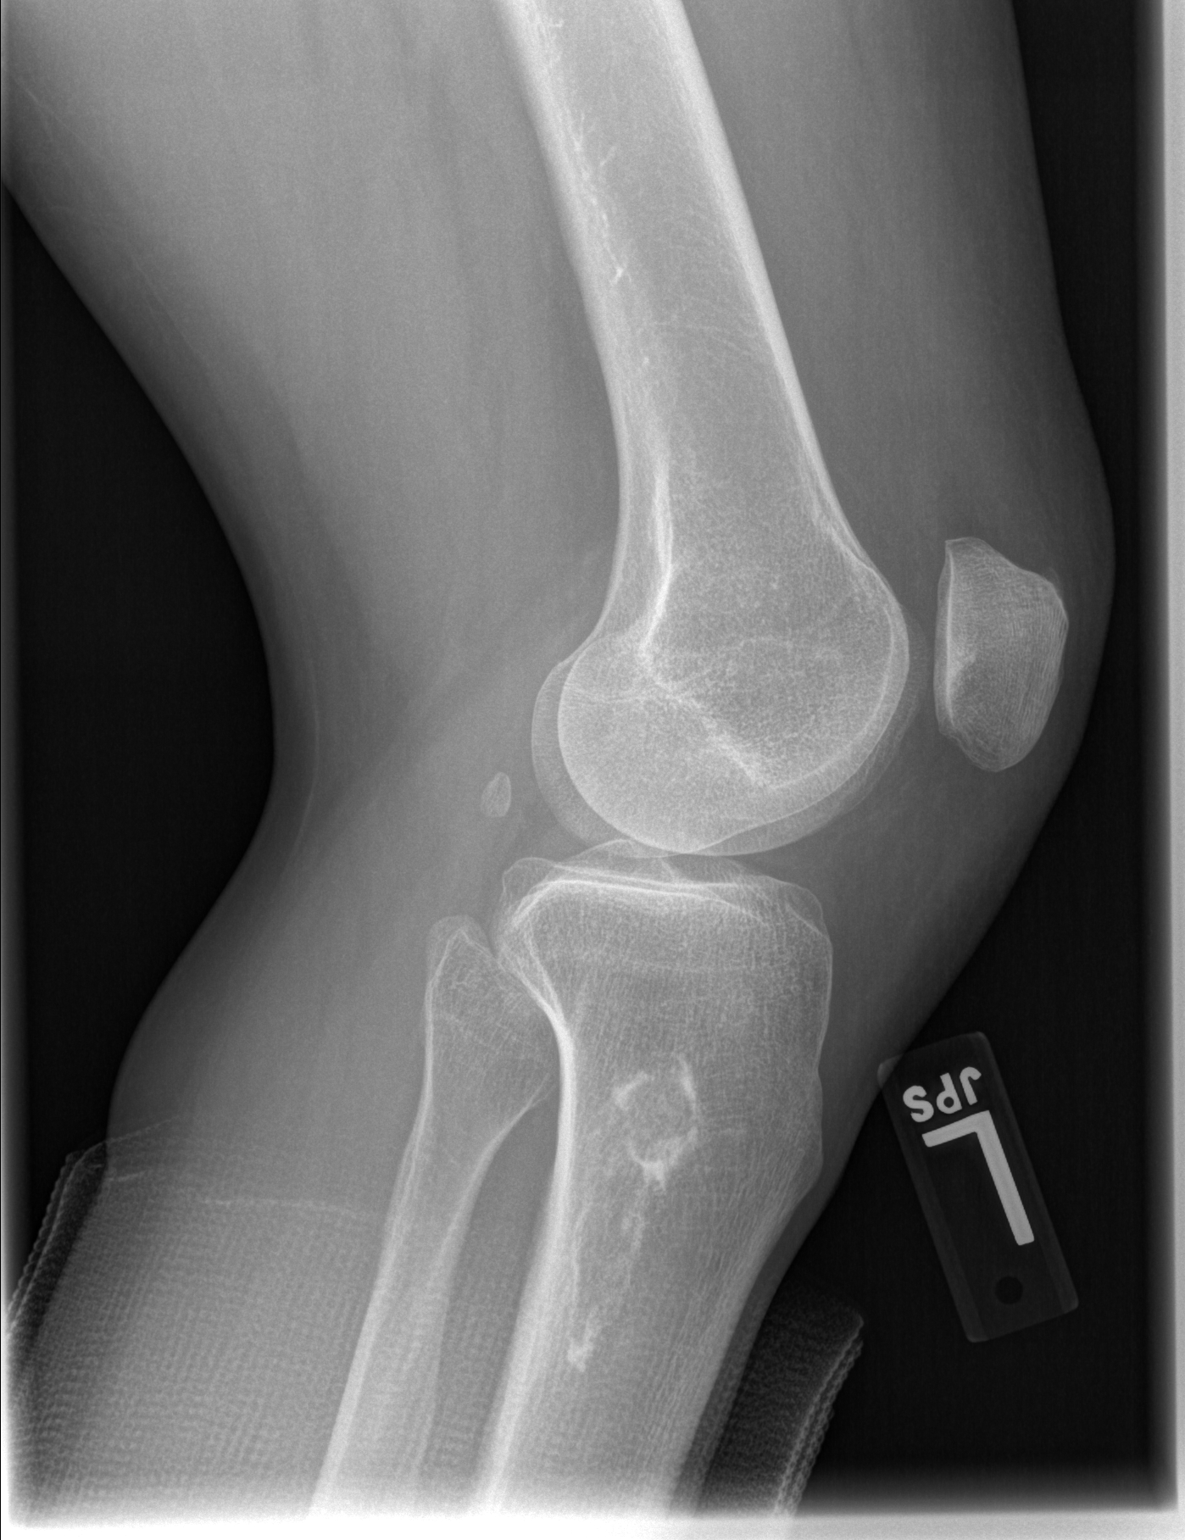

[4 of 4 positions shown; findings below may reference images not displayed]

FINDINGS: There is no acute bony or joint abnormality. Small
sclerotic lesion in the proximal tibia is compatible with an
infarct and unchanged.
IMPRESSION: No acute finding.

## 2010-10-13 ENCOUNTER — Emergency Department (HOSPITAL_COMMUNITY): Admission: EM | Admit: 2010-10-13 | Discharge: 2009-12-09 | Payer: Self-pay | Admitting: Emergency Medicine

## 2010-11-19 ENCOUNTER — Emergency Department (HOSPITAL_COMMUNITY)
Admission: EM | Admit: 2010-11-19 | Discharge: 2010-11-19 | Payer: Self-pay | Source: Home / Self Care | Admitting: Emergency Medicine

## 2010-11-26 ENCOUNTER — Encounter: Payer: Self-pay | Admitting: *Deleted

## 2010-11-26 ENCOUNTER — Encounter: Payer: Self-pay | Admitting: Internal Medicine

## 2010-11-27 ENCOUNTER — Encounter: Payer: Self-pay | Admitting: Internal Medicine

## 2010-11-27 ENCOUNTER — Encounter: Payer: Self-pay | Admitting: Emergency Medicine

## 2010-11-28 ENCOUNTER — Encounter: Payer: Self-pay | Admitting: Obstetrics and Gynecology

## 2010-12-28 ENCOUNTER — Emergency Department (HOSPITAL_COMMUNITY): Payer: Medicare Other

## 2010-12-28 ENCOUNTER — Emergency Department (HOSPITAL_COMMUNITY)
Admission: EM | Admit: 2010-12-28 | Discharge: 2010-12-28 | Discharge status: Against Medical Advice | Payer: Medicare Other | Attending: Emergency Medicine | Admitting: Emergency Medicine

## 2010-12-28 DIAGNOSIS — R0602 Shortness of breath: Secondary | ICD-10-CM | POA: Insufficient documentation

## 2010-12-28 DIAGNOSIS — M7989 Other specified soft tissue disorders: Secondary | ICD-10-CM | POA: Insufficient documentation

## 2010-12-28 LAB — COMPREHENSIVE METABOLIC PANEL
ALT: 44 U/L — ABNORMAL HIGH (ref 0–35)
AST: 58 U/L — ABNORMAL HIGH (ref 0–37)
Albumin: 3.3 g/dL — ABNORMAL LOW (ref 3.5–5.2)
Alkaline Phosphatase: 140 U/L — ABNORMAL HIGH (ref 39–117)
Calcium: 8.7 mg/dL (ref 8.4–10.5)
GFR calc Af Amer: >60 mL/min (ref >60)
Potassium: 3.6 mEq/L (ref 3.5–5.1)
Sodium: 134 mEq/L — ABNORMAL LOW (ref 135–145)
Total Protein: 5.7 g/dL — ABNORMAL LOW (ref 6.0–8.3)

## 2010-12-28 LAB — DIFFERENTIAL
Basophils Absolute: 0 10*3/uL (ref 0.0–0.1)
Basophils Relative: 1 % (ref 0–1)
Eosinophils Relative: 7 % — ABNORMAL HIGH (ref 0–5)
Lymphocytes Relative: 38 % (ref 12–46)
Monocytes Absolute: 0.5 10*3/uL (ref 0.1–1.0)

## 2010-12-28 LAB — CBC
MCHC: 33.7 g/dL (ref 30.0–36.0)
Platelets: 275 10*3/uL (ref 150–400)
RDW: 13 % (ref 11.5–15.5)
WBC: 6.8 10*3/uL (ref 4.0–10.5)

## 2010-12-29 ENCOUNTER — Emergency Department (HOSPITAL_COMMUNITY)
Admission: EM | Admit: 2010-12-29 | Discharge: 2010-12-29 | Disposition: A | Discharge status: Home / Self Care | Payer: Medicare Other | Attending: Emergency Medicine | Admitting: Emergency Medicine

## 2010-12-29 ENCOUNTER — Emergency Department (HOSPITAL_COMMUNITY): Payer: Medicare Other

## 2010-12-29 DIAGNOSIS — R0602 Shortness of breath: Secondary | ICD-10-CM | POA: Insufficient documentation

## 2010-12-29 DIAGNOSIS — R609 Edema, unspecified: Secondary | ICD-10-CM | POA: Insufficient documentation

## 2010-12-29 DIAGNOSIS — G35 Multiple sclerosis: Secondary | ICD-10-CM | POA: Insufficient documentation

## 2010-12-29 DIAGNOSIS — M549 Dorsalgia, unspecified: Secondary | ICD-10-CM | POA: Insufficient documentation

## 2010-12-29 DIAGNOSIS — K589 Irritable bowel syndrome without diarrhea: Secondary | ICD-10-CM | POA: Insufficient documentation

## 2010-12-29 DIAGNOSIS — F329 Major depressive disorder, single episode, unspecified: Secondary | ICD-10-CM | POA: Insufficient documentation

## 2010-12-29 DIAGNOSIS — R0609 Other forms of dyspnea: Secondary | ICD-10-CM | POA: Insufficient documentation

## 2010-12-29 DIAGNOSIS — F3289 Other specified depressive episodes: Secondary | ICD-10-CM | POA: Insufficient documentation

## 2010-12-29 DIAGNOSIS — G8929 Other chronic pain: Secondary | ICD-10-CM | POA: Insufficient documentation

## 2010-12-29 DIAGNOSIS — Z79899 Other long term (current) drug therapy: Secondary | ICD-10-CM | POA: Insufficient documentation

## 2010-12-29 DIAGNOSIS — R0989 Other specified symptoms and signs involving the circulatory and respiratory systems: Secondary | ICD-10-CM | POA: Insufficient documentation

## 2010-12-29 LAB — POCT CARDIAC MARKERS
CKMB, poc: 1.8 ng/mL (ref 1.0–8.0)
Myoglobin, poc: 87.4 ng/mL (ref 12–200)
Troponin i, poc: <0.05 ng/mL (ref 0.00–0.09)

## 2010-12-29 LAB — POCT I-STAT, CHEM 8
BUN: <3 mg/dL — ABNORMAL LOW (ref 6–23)
Calcium, Ion: 1.11 mmol/L — ABNORMAL LOW (ref 1.12–1.32)
Chloride: 99 mEq/L (ref 96–112)
Glucose, Bld: 77 mg/dL (ref 70–99)
TCO2: 28 mmol/L (ref 0–100)

## 2010-12-29 LAB — DIFFERENTIAL
Basophils Relative: 0 % (ref 0–1)
Eosinophils Absolute: 0.5 10*3/uL (ref 0.0–0.7)
Eosinophils Relative: 8 % — ABNORMAL HIGH (ref 0–5)
Lymphs Abs: 2.8 10*3/uL (ref 0.7–4.0)
Monocytes Relative: 9 % (ref 3–12)

## 2010-12-29 LAB — URINALYSIS, ROUTINE W REFLEX MICROSCOPIC
Ketones, ur: NEGATIVE mg/dL
Leukocytes, UA: NEGATIVE
Nitrite: NEGATIVE
Protein, ur: NEGATIVE mg/dL
Urine Glucose, Fasting: NEGATIVE mg/dL
pH: 7 (ref 5.0–8.0)

## 2010-12-29 LAB — CBC
MCH: 31.7 pg (ref 26.0–34.0)
MCHC: 33.4 g/dL (ref 30.0–36.0)
MCV: 94.8 fL (ref 78.0–100.0)
Platelets: 295 10*3/uL (ref 150–400)
RDW: 13 % (ref 11.5–15.5)

## 2010-12-29 LAB — POCT PREGNANCY, URINE: Preg Test, Ur: NEGATIVE

## 2010-12-29 LAB — URINE MICROSCOPIC-ADD ON

## 2011-01-04 ENCOUNTER — Ambulatory Visit: Payer: Medicare Other | Attending: Sports Medicine | Admitting: Rehabilitation

## 2011-01-05 ENCOUNTER — Emergency Department (HOSPITAL_COMMUNITY): Payer: Medicare Other

## 2011-01-05 ENCOUNTER — Emergency Department (HOSPITAL_COMMUNITY)
Admission: EM | Admit: 2011-01-05 | Discharge: 2011-01-05 | Disposition: A | Discharge status: Home / Self Care | Payer: Medicare Other | Attending: Emergency Medicine | Admitting: Emergency Medicine

## 2011-01-05 DIAGNOSIS — M7989 Other specified soft tissue disorders: Secondary | ICD-10-CM | POA: Insufficient documentation

## 2011-01-05 DIAGNOSIS — R609 Edema, unspecified: Secondary | ICD-10-CM | POA: Insufficient documentation

## 2011-01-05 DIAGNOSIS — G8929 Other chronic pain: Secondary | ICD-10-CM | POA: Insufficient documentation

## 2011-01-05 DIAGNOSIS — F3289 Other specified depressive episodes: Secondary | ICD-10-CM | POA: Insufficient documentation

## 2011-01-05 DIAGNOSIS — M129 Arthropathy, unspecified: Secondary | ICD-10-CM | POA: Insufficient documentation

## 2011-01-05 DIAGNOSIS — Z79899 Other long term (current) drug therapy: Secondary | ICD-10-CM | POA: Insufficient documentation

## 2011-01-05 DIAGNOSIS — G35 Multiple sclerosis: Secondary | ICD-10-CM | POA: Insufficient documentation

## 2011-01-05 DIAGNOSIS — R0989 Other specified symptoms and signs involving the circulatory and respiratory systems: Secondary | ICD-10-CM | POA: Insufficient documentation

## 2011-01-05 DIAGNOSIS — R0609 Other forms of dyspnea: Secondary | ICD-10-CM | POA: Insufficient documentation

## 2011-01-05 DIAGNOSIS — K589 Irritable bowel syndrome without diarrhea: Secondary | ICD-10-CM | POA: Insufficient documentation

## 2011-01-05 DIAGNOSIS — R0602 Shortness of breath: Secondary | ICD-10-CM | POA: Insufficient documentation

## 2011-01-05 DIAGNOSIS — M79609 Pain in unspecified limb: Secondary | ICD-10-CM | POA: Insufficient documentation

## 2011-01-05 DIAGNOSIS — IMO0001 Reserved for inherently not codable concepts without codable children: Secondary | ICD-10-CM | POA: Insufficient documentation

## 2011-01-05 DIAGNOSIS — F329 Major depressive disorder, single episode, unspecified: Secondary | ICD-10-CM | POA: Insufficient documentation

## 2011-01-05 LAB — DIFFERENTIAL
Basophils Absolute: 0.1 10*3/uL (ref 0.0–0.1)
Eosinophils Absolute: 0.6 10*3/uL (ref 0.0–0.7)
Lymphocytes Relative: 43 % (ref 12–46)
Neutro Abs: 2.7 10*3/uL (ref 1.7–7.7)
Neutrophils Relative %: 38 % — ABNORMAL LOW (ref 43–77)

## 2011-01-05 LAB — URINALYSIS, ROUTINE W REFLEX MICROSCOPIC
Leukocytes, UA: NEGATIVE
Urine Glucose, Fasting: NEGATIVE mg/dL
pH: 7.5 (ref 5.0–8.0)

## 2011-01-05 LAB — CBC
MCV: 93.3 fL (ref 78.0–100.0)
Platelets: UNDETERMINED 10*3/uL (ref 150–400)
RDW: 12.7 % (ref 11.5–15.5)
WBC: 7.2 10*3/uL (ref 4.0–10.5)

## 2011-01-05 LAB — COMPREHENSIVE METABOLIC PANEL
Albumin: 3.9 g/dL (ref 3.5–5.2)
Alkaline Phosphatase: 157 U/L — ABNORMAL HIGH (ref 39–117)
BUN: 3 mg/dL — ABNORMAL LOW (ref 6–23)
Creatinine, Ser: 0.59 mg/dL (ref 0.4–1.2)
Potassium: 3.8 mEq/L (ref 3.5–5.1)
Total Protein: 6.8 g/dL (ref 6.0–8.3)

## 2011-01-05 LAB — URINE MICROSCOPIC-ADD ON

## 2011-01-18 LAB — DIFFERENTIAL
Basophils Absolute: 0 10*3/uL (ref 0.0–0.1)
Basophils Relative: 0 % (ref 0–1)
Eosinophils Relative: 1 % (ref 0–5)
Lymphocytes Relative: 35 % (ref 12–46)
Neutro Abs: 5.1 10*3/uL (ref 1.7–7.7)

## 2011-01-18 LAB — POCT CARDIAC MARKERS
CKMB, poc: <1 ng/mL — ABNORMAL LOW (ref 1.0–8.0)
CKMB, poc: <1 ng/mL — ABNORMAL LOW (ref 1.0–8.0)
CKMB, poc: <1 ng/mL — ABNORMAL LOW (ref 1.0–8.0)
Myoglobin, poc: 41 ng/mL (ref 12–200)
Troponin i, poc: <0.05 ng/mL (ref 0.00–0.09)

## 2011-01-18 LAB — CBC
Platelets: 268 10*3/uL (ref 150–400)
RDW: 13.5 % (ref 11.5–15.5)
WBC: 8.8 10*3/uL (ref 4.0–10.5)

## 2011-01-18 LAB — BASIC METABOLIC PANEL
BUN: 4 mg/dL — ABNORMAL LOW (ref 6–23)
Creatinine, Ser: 0.62 mg/dL (ref 0.4–1.2)
GFR calc non Af Amer: >60 mL/min (ref >60)

## 2011-01-18 LAB — PROTIME-INR: Prothrombin Time: 14.2 seconds (ref 11.6–15.2)

## 2011-01-18 LAB — CK TOTAL AND CKMB (NOT AT ARMC): Total CK: 59 U/L (ref 7–177)

## 2011-01-18 LAB — APTT: aPTT: 28 seconds (ref 24–37)

## 2011-01-18 LAB — CARDIAC PANEL(CRET KIN+CKTOT+MB+TROPI)
Relative Index: INVALID (ref 0.0–2.5)
Total CK: 124 U/L (ref 7–177)
Total CK: 80 U/L (ref 7–177)
Troponin I: 0.01 ng/mL (ref 0.00–0.06)

## 2011-01-19 LAB — COMPREHENSIVE METABOLIC PANEL
ALT: 13 U/L (ref 0–35)
ALT: 17 U/L (ref 0–35)
AST: 16 U/L (ref 0–37)
Albumin: 3.5 g/dL (ref 3.5–5.2)
Albumin: 4.2 g/dL (ref 3.5–5.2)
BUN: 4 mg/dL — ABNORMAL LOW (ref 6–23)
BUN: 4 mg/dL — ABNORMAL LOW (ref 6–23)
CO2: 24 mEq/L (ref 19–32)
CO2: 28 mEq/L (ref 19–32)
Calcium: 8.9 mg/dL (ref 8.4–10.5)
Calcium: 9.1 mg/dL (ref 8.4–10.5)
Calcium: 9.3 mg/dL (ref 8.4–10.5)
Creatinine, Ser: 0.56 mg/dL (ref 0.4–1.2)
Creatinine, Ser: 0.64 mg/dL (ref 0.4–1.2)
GFR calc Af Amer: >60 mL/min (ref >60)
GFR calc non Af Amer: >60 mL/min (ref >60)
Glucose, Bld: 86 mg/dL (ref 70–99)
Sodium: 139 mEq/L (ref 135–145)
Sodium: 141 mEq/L (ref 135–145)
Total Protein: 6.5 g/dL (ref 6.0–8.3)
Total Protein: 7.2 g/dL (ref 6.0–8.3)
Total Protein: 7.5 g/dL (ref 6.0–8.3)

## 2011-01-19 LAB — CBC
HCT: 44.5 % (ref 36.0–46.0)
Hemoglobin: 15.2 g/dL — ABNORMAL HIGH (ref 12.0–15.0)
MCH: 31.8 pg (ref 26.0–34.0)
MCH: 31.9 pg (ref 26.0–34.0)
MCHC: 34.7 g/dL (ref 30.0–36.0)
MCHC: 34.2 g/dL (ref 30.0–36.0)
MCV: 91.8 fL (ref 78.0–100.0)
MCV: 93.5 fL (ref 78.0–100.0)
Platelets: 249 10*3/uL (ref 150–400)
RDW: 13.7 % (ref 11.5–15.5)
RDW: 13.9 % (ref 11.5–15.5)
WBC: 10 10*3/uL (ref 4.0–10.5)

## 2011-01-19 LAB — URINALYSIS, ROUTINE W REFLEX MICROSCOPIC
Bilirubin Urine: NEGATIVE
Glucose, UA: NEGATIVE mg/dL
Glucose, UA: NEGATIVE mg/dL
Ketones, ur: NEGATIVE mg/dL
Leukocytes, UA: NEGATIVE
Protein, ur: NEGATIVE mg/dL
Specific Gravity, Urine: 1.01 (ref 1.005–1.030)
Urobilinogen, UA: 1 mg/dL (ref 0.0–1.0)

## 2011-01-19 LAB — LIPASE, BLOOD: Lipase: 39 U/L (ref 11–59)

## 2011-01-19 LAB — URINE MICROSCOPIC-ADD ON

## 2011-01-19 LAB — DIFFERENTIAL
Basophils Relative: 1 % (ref 0–1)
Eosinophils Absolute: 0 10*3/uL (ref 0.0–0.7)
Eosinophils Relative: 0 % (ref 0–5)
Lymphocytes Relative: 44 % (ref 12–46)
Lymphocytes Relative: 32 % (ref 12–46)
Monocytes Absolute: 0.6 10*3/uL (ref 0.1–1.0)
Monocytes Absolute: 0.6 10*3/uL (ref 0.1–1.0)
Monocytes Relative: 6 % (ref 3–12)
Neutro Abs: 4.9 10*3/uL (ref 1.7–7.7)
Neutrophils Relative %: 61 % (ref 43–77)

## 2011-01-19 LAB — POCT PREGNANCY, URINE: Preg Test, Ur: NEGATIVE

## 2011-01-20 LAB — RAPID URINE DRUG SCREEN, HOSP PERFORMED
Amphetamines: NOT DETECTED
Barbiturates: NOT DETECTED
Benzodiazepines: POSITIVE — AB
Cocaine: NOT DETECTED
Opiates: POSITIVE — AB

## 2011-01-20 LAB — URINALYSIS, ROUTINE W REFLEX MICROSCOPIC
Bilirubin Urine: NEGATIVE
Glucose, UA: NEGATIVE mg/dL
Glucose, UA: NEGATIVE mg/dL
Glucose, UA: NEGATIVE mg/dL
Ketones, ur: NEGATIVE mg/dL
Ketones, ur: NEGATIVE mg/dL
Ketones, ur: NEGATIVE mg/dL
Leukocytes, UA: NEGATIVE
Nitrite: NEGATIVE
Protein, ur: NEGATIVE mg/dL
Protein, ur: NEGATIVE mg/dL
Protein, ur: NEGATIVE mg/dL
Urobilinogen, UA: 0.2 mg/dL (ref 0.0–1.0)
pH: 6.5 (ref 5.0–8.0)

## 2011-01-20 LAB — COMPREHENSIVE METABOLIC PANEL
ALT: 12 U/L (ref 0–35)
ALT: 13 U/L (ref 0–35)
AST: 16 U/L (ref 0–37)
AST: 17 U/L (ref 0–37)
Albumin: 3.8 g/dL (ref 3.5–5.2)
BUN: 5 mg/dL — ABNORMAL LOW (ref 6–23)
CO2: 27 mEq/L (ref 19–32)
CO2: 28 mEq/L (ref 19–32)
CO2: 28 mEq/L (ref 19–32)
Calcium: 9 mg/dL (ref 8.4–10.5)
Calcium: 9 mg/dL (ref 8.4–10.5)
Chloride: 105 mEq/L (ref 96–112)
Creatinine, Ser: 0.63 mg/dL (ref 0.4–1.2)
Creatinine, Ser: 0.69 mg/dL (ref 0.4–1.2)
GFR calc Af Amer: >60 mL/min (ref >60)
GFR calc Af Amer: >60 mL/min (ref >60)
GFR calc non Af Amer: >60 mL/min (ref >60)
GFR calc non Af Amer: >60 mL/min (ref >60)
GFR calc non Af Amer: >60 mL/min (ref >60)
Glucose, Bld: 101 mg/dL — ABNORMAL HIGH (ref 70–99)
Sodium: 138 mEq/L (ref 135–145)
Total Bilirubin: 0.4 mg/dL (ref 0.3–1.2)
Total Protein: 6 g/dL (ref 6.0–8.3)

## 2011-01-20 LAB — CBC
HCT: 39.8 % (ref 36.0–46.0)
HCT: 40.5 % (ref 36.0–46.0)
Hemoglobin: 13.6 g/dL (ref 12.0–15.0)
Hemoglobin: 13.9 g/dL (ref 12.0–15.0)
MCH: 32.2 pg (ref 26.0–34.0)
MCH: 32.2 pg (ref 26.0–34.0)
MCH: 32.3 pg (ref 26.0–34.0)
MCHC: 34.2 g/dL (ref 30.0–36.0)
MCHC: 34.2 g/dL (ref 30.0–36.0)
MCV: 94.3 fL (ref 78.0–100.0)
Platelets: 299 10*3/uL (ref 150–400)
RBC: 4.32 MIL/uL (ref 3.87–5.11)
RDW: 13.8 % (ref 11.5–15.5)

## 2011-01-20 LAB — POCT I-STAT, CHEM 8
BUN: 5 mg/dL — ABNORMAL LOW (ref 6–23)
Chloride: 104 mEq/L (ref 96–112)
HCT: 40 % (ref 36.0–46.0)
Potassium: 4.2 mEq/L (ref 3.5–5.1)
Sodium: 137 mEq/L (ref 135–145)

## 2011-01-20 LAB — DIFFERENTIAL
Basophils Absolute: 0.1 10*3/uL (ref 0.0–0.1)
Basophils Relative: 0 % (ref 0–1)
Eosinophils Absolute: 0.1 10*3/uL (ref 0.0–0.7)
Eosinophils Absolute: 0.1 10*3/uL (ref 0.0–0.7)
Eosinophils Relative: 1 % (ref 0–5)
Eosinophils Relative: 1 % (ref 0–5)
Lymphocytes Relative: 34 % (ref 12–46)
Lymphocytes Relative: 38 % (ref 12–46)
Lymphs Abs: 3.1 10*3/uL (ref 0.7–4.0)
Lymphs Abs: 3.7 10*3/uL (ref 0.7–4.0)
Neutro Abs: 5.2 10*3/uL (ref 1.7–7.7)
Neutrophils Relative %: 57 % (ref 43–77)

## 2011-01-20 LAB — LIPASE, BLOOD
Lipase: 39 U/L (ref 11–59)
Lipase: 47 U/L (ref 11–59)

## 2011-01-20 LAB — URINE MICROSCOPIC-ADD ON

## 2011-01-20 LAB — POCT PREGNANCY, URINE: Preg Test, Ur: NEGATIVE

## 2011-01-21 LAB — COMPREHENSIVE METABOLIC PANEL
AST: 17 U/L (ref 0–37)
Alkaline Phosphatase: 85 U/L (ref 39–117)
BUN: 2 mg/dL — ABNORMAL LOW (ref 6–23)
CO2: 28 mEq/L (ref 19–32)
CO2: 28 mEq/L (ref 19–32)
Calcium: 8.9 mg/dL (ref 8.4–10.5)
Chloride: 108 mEq/L (ref 96–112)
Creatinine, Ser: 0.65 mg/dL (ref 0.4–1.2)
GFR calc Af Amer: >60 mL/min (ref >60)
GFR calc non Af Amer: >60 mL/min (ref >60)
GFR calc non Af Amer: >60 mL/min (ref >60)
Glucose, Bld: 102 mg/dL — ABNORMAL HIGH (ref 70–99)
Glucose, Bld: 90 mg/dL (ref 70–99)
Total Bilirubin: 0.4 mg/dL (ref 0.3–1.2)
Total Protein: 6.7 g/dL (ref 6.0–8.3)

## 2011-01-21 LAB — LIPID PANEL
Cholesterol: 187 mg/dL (ref 0–200)
HDL: 42 mg/dL (ref >39)
LDL Cholesterol: 117 mg/dL — ABNORMAL HIGH (ref 0–99)
Total CHOL/HDL Ratio: 4.5 RATIO

## 2011-01-21 LAB — POCT I-STAT, CHEM 8
BUN: 3 mg/dL — ABNORMAL LOW (ref 6–23)
Calcium, Ion: 1.09 mmol/L — ABNORMAL LOW (ref 1.12–1.32)
Chloride: 104 mEq/L (ref 96–112)
Creatinine, Ser: 0.6 mg/dL (ref 0.4–1.2)
Glucose, Bld: 84 mg/dL (ref 70–99)
TCO2: 25 mmol/L (ref 0–100)

## 2011-01-21 LAB — DIFFERENTIAL
Basophils Absolute: 0 10*3/uL (ref 0.0–0.1)
Eosinophils Absolute: 0.1 10*3/uL (ref 0.0–0.7)
Eosinophils Absolute: 0.1 10*3/uL (ref 0.0–0.7)
Eosinophils Relative: 1 % (ref 0–5)
Lymphocytes Relative: 31 % (ref 12–46)
Lymphocytes Relative: 40 % (ref 12–46)
Lymphs Abs: 3.1 10*3/uL (ref 0.7–4.0)
Lymphs Abs: 4 10*3/uL (ref 0.7–4.0)
Monocytes Absolute: 0.3 10*3/uL (ref 0.1–1.0)
Monocytes Relative: 3 % (ref 3–12)
Neutro Abs: 6.6 10*3/uL (ref 1.7–7.7)

## 2011-01-21 LAB — URINALYSIS, ROUTINE W REFLEX MICROSCOPIC
Bilirubin Urine: NEGATIVE
Nitrite: NEGATIVE
Protein, ur: NEGATIVE mg/dL
Specific Gravity, Urine: 1.002 — ABNORMAL LOW (ref 1.005–1.030)
Urobilinogen, UA: 0.2 mg/dL (ref 0.0–1.0)

## 2011-01-21 LAB — CBC
HCT: 40.7 % (ref 36.0–46.0)
HCT: 41 % (ref 36.0–46.0)
Hemoglobin: 14.1 g/dL (ref 12.0–15.0)
Hemoglobin: 14.3 g/dL (ref 12.0–15.0)
MCH: 32.7 pg (ref 26.0–34.0)
MCH: 33.2 pg (ref 26.0–34.0)
MCV: 94.5 fL (ref 78.0–100.0)
MCV: 95 fL (ref 78.0–100.0)
Platelets: 341 10*3/uL (ref 150–400)
RBC: 4.31 MIL/uL (ref 3.87–5.11)
RBC: 4.32 MIL/uL (ref 3.87–5.11)
WBC: 10 10*3/uL (ref 4.0–10.5)

## 2011-01-21 LAB — MAGNESIUM: Magnesium: 2.1 mg/dL (ref 1.5–2.5)

## 2011-01-21 LAB — LIPASE, BLOOD: Lipase: 16 U/L (ref 11–59)

## 2011-01-21 LAB — URINE MICROSCOPIC-ADD ON

## 2011-01-21 LAB — GLUCOSE, CAPILLARY: Glucose-Capillary: 88 mg/dL (ref 70–99)

## 2011-01-22 LAB — COMPREHENSIVE METABOLIC PANEL
AST: 21 U/L (ref 0–37)
Albumin: 4.2 g/dL (ref 3.5–5.2)
BUN: 4 mg/dL — ABNORMAL LOW (ref 6–23)
Calcium: 8.7 mg/dL (ref 8.4–10.5)
Chloride: 99 mEq/L (ref 96–112)
Creatinine, Ser: 0.58 mg/dL (ref 0.4–1.2)
Creatinine, Ser: 0.63 mg/dL (ref 0.4–1.2)
GFR calc Af Amer: >60 mL/min (ref >60)
GFR calc non Af Amer: >60 mL/min (ref >60)
Glucose, Bld: 96 mg/dL (ref 70–99)
Potassium: 3.6 mEq/L (ref 3.5–5.1)
Sodium: 135 mEq/L (ref 135–145)
Total Bilirubin: 0.7 mg/dL (ref 0.3–1.2)
Total Protein: 6.4 g/dL (ref 6.0–8.3)

## 2011-01-22 LAB — DIFFERENTIAL
Basophils Relative: 1 % (ref 0–1)
Lymphocytes Relative: 40 % (ref 12–46)
Lymphs Abs: 3.2 10*3/uL (ref 0.7–4.0)
Lymphs Abs: 4.5 10*3/uL — ABNORMAL HIGH (ref 0.7–4.0)
Monocytes Relative: 4 % (ref 3–12)
Monocytes Relative: 5 % (ref 3–12)
Neutro Abs: 4.4 10*3/uL (ref 1.7–7.7)
Neutro Abs: 6.9 10*3/uL (ref 1.7–7.7)
Neutrophils Relative %: 54 % (ref 43–77)
Neutrophils Relative %: 56 % (ref 43–77)

## 2011-01-22 LAB — RAPID URINE DRUG SCREEN, HOSP PERFORMED
Amphetamines: NOT DETECTED
Barbiturates: NOT DETECTED
Benzodiazepines: POSITIVE — AB
Opiates: NOT DETECTED

## 2011-01-22 LAB — CBC
HCT: 36.2 % (ref 36.0–46.0)
HCT: 40.4 % (ref 36.0–46.0)
Hemoglobin: 12.1 g/dL (ref 12.0–15.0)
Hemoglobin: 13.7 g/dL (ref 12.0–15.0)
MCHC: 33.5 g/dL (ref 30.0–36.0)
MCHC: 33.8 g/dL (ref 30.0–36.0)
MCV: 97.1 fL (ref 78.0–100.0)
MCV: 97.4 fL (ref 78.0–100.0)
Platelets: 183 10*3/uL (ref 150–400)
RBC: 4.44 MIL/uL (ref 3.87–5.11)
RDW: 14.4 % (ref 11.5–15.5)
RDW: 14.7 % (ref 11.5–15.5)
WBC: 12.2 10*3/uL — ABNORMAL HIGH (ref 4.0–10.5)

## 2011-01-22 LAB — URINE MICROSCOPIC-ADD ON

## 2011-01-22 LAB — URINALYSIS, ROUTINE W REFLEX MICROSCOPIC
Bilirubin Urine: NEGATIVE
Glucose, UA: NEGATIVE mg/dL
Protein, ur: NEGATIVE mg/dL
Urobilinogen, UA: 0.2 mg/dL (ref 0.0–1.0)

## 2011-01-22 LAB — POCT I-STAT, CHEM 8
Calcium, Ion: 1.01 mmol/L — ABNORMAL LOW (ref 1.12–1.32)
Calcium, Ion: 1.13 mmol/L (ref 1.12–1.32)
Chloride: 104 mEq/L (ref 96–112)
Creatinine, Ser: 0.6 mg/dL (ref 0.4–1.2)
Glucose, Bld: 70 mg/dL (ref 70–99)
Glucose, Bld: 82 mg/dL (ref 70–99)
HCT: 44 % (ref 36.0–46.0)
HCT: 44 % (ref 36.0–46.0)
Hemoglobin: 15 g/dL (ref 12.0–15.0)
Potassium: 3.6 mEq/L (ref 3.5–5.1)

## 2011-01-22 LAB — BASIC METABOLIC PANEL
BUN: 5 mg/dL — ABNORMAL LOW (ref 6–23)
CO2: 20 mEq/L (ref 19–32)
GFR calc non Af Amer: >60 mL/min (ref >60)
Glucose, Bld: 91 mg/dL (ref 70–99)
Potassium: 3.8 mEq/L (ref 3.5–5.1)
Sodium: 139 mEq/L (ref 135–145)

## 2011-01-22 LAB — URINE CULTURE

## 2011-01-22 LAB — PROTIME-INR: INR: 1.14 (ref 0.00–1.49)

## 2011-01-22 LAB — POCT CARDIAC MARKERS
CKMB, poc: <1 ng/mL — ABNORMAL LOW (ref 1.0–8.0)
Troponin i, poc: <0.05 ng/mL (ref 0.00–0.09)

## 2011-01-22 LAB — SALICYLATE LEVEL: Salicylate Lvl: <4 mg/dL (ref 2.8–20.0)

## 2011-01-23 LAB — POCT I-STAT, CHEM 8
BUN: <3 mg/dL — ABNORMAL LOW (ref 6–23)
BUN: <3 mg/dL — ABNORMAL LOW (ref 6–23)
Calcium, Ion: 1.09 mmol/L — ABNORMAL LOW (ref 1.12–1.32)
Chloride: 103 mEq/L (ref 96–112)
Chloride: 107 mEq/L (ref 96–112)
Creatinine, Ser: 0.6 mg/dL (ref 0.4–1.2)
Creatinine, Ser: 0.6 mg/dL (ref 0.4–1.2)
Glucose, Bld: 92 mg/dL (ref 70–99)
HCT: 46 % (ref 36.0–46.0)
Hemoglobin: 15.6 g/dL — ABNORMAL HIGH (ref 12.0–15.0)
Potassium: 3.7 mEq/L (ref 3.5–5.1)
Potassium: 4 mEq/L (ref 3.5–5.1)
Sodium: 141 mEq/L (ref 135–145)
Sodium: 140 mEq/L (ref 135–145)
TCO2: 26 mmol/L (ref 0–100)
TCO2: 29 mmol/L (ref 0–100)

## 2011-01-23 LAB — DIFFERENTIAL
Basophils Absolute: 0 10*3/uL (ref 0.0–0.1)
Basophils Relative: 1 % (ref 0–1)
Basophils Relative: 1 % (ref 0–1)
Basophils Relative: 2 % — ABNORMAL HIGH (ref 0–1)
Eosinophils Absolute: 0.1 10*3/uL (ref 0.0–0.7)
Eosinophils Absolute: 0.1 10*3/uL (ref 0.0–0.7)
Eosinophils Absolute: 0.2 10*3/uL (ref 0.0–0.7)
Eosinophils Relative: 1 % (ref 0–5)
Eosinophils Relative: 1 % (ref 0–5)
Lymphocytes Relative: 52 % — ABNORMAL HIGH (ref 12–46)
Lymphs Abs: 3.7 10*3/uL (ref 0.7–4.0)
Monocytes Absolute: 0.2 10*3/uL (ref 0.1–1.0)
Monocytes Absolute: 0.3 10*3/uL (ref 0.1–1.0)
Monocytes Relative: 3 % (ref 3–12)
Monocytes Relative: 4 % (ref 3–12)
Monocytes Relative: 4 % (ref 3–12)
Neutrophils Relative %: 49 % (ref 43–77)
Neutrophils Relative %: 62 % (ref 43–77)

## 2011-01-23 LAB — CBC
HCT: 37.8 % (ref 36.0–46.0)
Hemoglobin: 12.5 g/dL (ref 12.0–15.0)
Hemoglobin: 13.1 g/dL (ref 12.0–15.0)
Hemoglobin: 14 g/dL (ref 12.0–15.0)
MCH: 32.8 pg (ref 26.0–34.0)
MCHC: 34.6 g/dL (ref 30.0–36.0)
MCHC: 34.9 g/dL (ref 30.0–36.0)
MCHC: 35.6 g/dL (ref 30.0–36.0)
MCV: 94.4 fL (ref 78.0–100.0)
MCV: 95 fL (ref 78.0–100.0)
Platelets: 216 10*3/uL (ref 150–400)
Platelets: 259 10*3/uL (ref 150–400)
Platelets: 266 10*3/uL (ref 150–400)
RBC: 3.98 MIL/uL (ref 3.87–5.11)
RBC: 4.24 MIL/uL (ref 3.87–5.11)
RDW: 12.6 % (ref 11.5–15.5)
RDW: 13.4 % (ref 11.5–15.5)
RDW: 13.6 % (ref 11.5–15.5)
RDW: 14 % (ref 11.5–15.5)
WBC: 6.9 10*3/uL (ref 4.0–10.5)
WBC: 7.2 10*3/uL (ref 4.0–10.5)

## 2011-01-23 LAB — URINALYSIS, ROUTINE W REFLEX MICROSCOPIC
Bilirubin Urine: NEGATIVE
Bilirubin Urine: NEGATIVE
Bilirubin Urine: NEGATIVE
Bilirubin Urine: NEGATIVE
Glucose, UA: NEGATIVE mg/dL
Glucose, UA: NEGATIVE mg/dL
Glucose, UA: NEGATIVE mg/dL
Glucose, UA: NEGATIVE mg/dL
Ketones, ur: NEGATIVE mg/dL
Ketones, ur: NEGATIVE mg/dL
Leukocytes, UA: NEGATIVE
Leukocytes, UA: NEGATIVE
Leukocytes, UA: NEGATIVE
Nitrite: NEGATIVE
Nitrite: NEGATIVE
Protein, ur: NEGATIVE mg/dL
Protein, ur: NEGATIVE mg/dL
Protein, ur: NEGATIVE mg/dL
Specific Gravity, Urine: 1.005 (ref 1.005–1.030)
Specific Gravity, Urine: 1.006 (ref 1.005–1.030)
Specific Gravity, Urine: 1.006 (ref 1.005–1.030)
pH: 6.5 (ref 5.0–8.0)
pH: 6.5 (ref 5.0–8.0)

## 2011-01-23 LAB — COMPREHENSIVE METABOLIC PANEL
ALT: 10 U/L (ref 0–35)
ALT: <8 U/L (ref 0–35)
ALT: <8 U/L (ref 0–35)
AST: 14 U/L (ref 0–37)
Albumin: 3.7 g/dL (ref 3.5–5.2)
Alkaline Phosphatase: 66 U/L (ref 39–117)
Alkaline Phosphatase: 96 U/L (ref 39–117)
CO2: 23 mEq/L (ref 19–32)
CO2: 27 mEq/L (ref 19–32)
Calcium: 8.8 mg/dL (ref 8.4–10.5)
Chloride: 108 mEq/L (ref 96–112)
Creatinine, Ser: 0.63 mg/dL (ref 0.4–1.2)
GFR calc Af Amer: >60 mL/min (ref >60)
GFR calc non Af Amer: >60 mL/min (ref >60)
GFR calc non Af Amer: >60 mL/min (ref >60)
Glucose, Bld: 94 mg/dL (ref 70–99)
Potassium: 3.6 mEq/L (ref 3.5–5.1)
Potassium: 4.5 mEq/L (ref 3.5–5.1)
Sodium: 136 mEq/L (ref 135–145)
Sodium: 140 mEq/L (ref 135–145)
Sodium: 140 mEq/L (ref 135–145)
Total Bilirubin: 0.6 mg/dL (ref 0.3–1.2)
Total Bilirubin: 1.1 mg/dL (ref 0.3–1.2)
Total Protein: 6 g/dL (ref 6.0–8.3)
Total Protein: 6.1 g/dL (ref 6.0–8.3)

## 2011-01-23 LAB — LIPASE, BLOOD
Lipase: 20 U/L (ref 11–59)
Lipase: 20 U/L (ref 11–59)
Lipase: 24 U/L (ref 11–59)

## 2011-01-23 LAB — BASIC METABOLIC PANEL
BUN: <1 mg/dL — ABNORMAL LOW (ref 6–23)
CO2: 31 mEq/L (ref 19–32)
Calcium: 8.3 mg/dL — ABNORMAL LOW (ref 8.4–10.5)
Chloride: 103 mEq/L (ref 96–112)
Chloride: 105 mEq/L (ref 96–112)
Creatinine, Ser: 0.64 mg/dL (ref 0.4–1.2)
GFR calc Af Amer: >60 mL/min (ref >60)
GFR calc Af Amer: >60 mL/min (ref >60)
GFR calc non Af Amer: >60 mL/min (ref >60)
Potassium: 3.4 mEq/L — ABNORMAL LOW (ref 3.5–5.1)
Sodium: 139 mEq/L (ref 135–145)
Sodium: 139 mEq/L (ref 135–145)

## 2011-01-23 LAB — HEPATIC FUNCTION PANEL
AST: 17 U/L (ref 0–37)
Albumin: 3 g/dL — ABNORMAL LOW (ref 3.5–5.2)
Alkaline Phosphatase: 83 U/L (ref 39–117)
Alkaline Phosphatase: 85 U/L (ref 39–117)
Indirect Bilirubin: 0.6 mg/dL (ref 0.3–0.9)
Total Bilirubin: 0.5 mg/dL (ref 0.3–1.2)
Total Bilirubin: 0.8 mg/dL (ref 0.3–1.2)
Total Protein: 5.6 g/dL — ABNORMAL LOW (ref 6.0–8.3)

## 2011-01-23 LAB — URINE MICROSCOPIC-ADD ON

## 2011-01-23 LAB — CORTISOL: Cortisol, Plasma: 0.6 ug/dL

## 2011-01-23 LAB — POCT PREGNANCY, URINE
Preg Test, Ur: NEGATIVE
Preg Test, Ur: NEGATIVE
Preg Test, Ur: NEGATIVE

## 2011-01-24 LAB — URINALYSIS, ROUTINE W REFLEX MICROSCOPIC
Bilirubin Urine: NEGATIVE
Bilirubin Urine: NEGATIVE
Glucose, UA: NEGATIVE mg/dL
Ketones, ur: NEGATIVE mg/dL
Ketones, ur: NEGATIVE mg/dL
Leukocytes, UA: NEGATIVE
Leukocytes, UA: NEGATIVE
Nitrite: NEGATIVE
Nitrite: NEGATIVE
Protein, ur: NEGATIVE mg/dL
Protein, ur: NEGATIVE mg/dL
Specific Gravity, Urine: 1.019 (ref 1.005–1.030)
Urobilinogen, UA: 0.2 mg/dL (ref 0.0–1.0)
Urobilinogen, UA: 0.2 mg/dL (ref 0.0–1.0)
pH: 6 (ref 5.0–8.0)
pH: 7 (ref 5.0–8.0)

## 2011-01-24 LAB — POCT CARDIAC MARKERS
CKMB, poc: <1 ng/mL — ABNORMAL LOW (ref 1.0–8.0)
Myoglobin, poc: 40.2 ng/mL (ref 12–200)
Troponin i, poc: <0.05 ng/mL (ref 0.00–0.09)

## 2011-01-24 LAB — POCT PREGNANCY, URINE: Preg Test, Ur: NEGATIVE

## 2011-01-24 LAB — CBC
HCT: 41.3 % (ref 36.0–46.0)
HCT: 43.5 % (ref 36.0–46.0)
HCT: 38.6 % (ref 36.0–46.0)
Hemoglobin: 14.6 g/dL (ref 12.0–15.0)
Hemoglobin: 15 g/dL (ref 12.0–15.0)
Hemoglobin: 13.5 g/dL (ref 12.0–15.0)
MCHC: 34.5 g/dL (ref 30.0–36.0)
MCHC: 35.3 g/dL (ref 30.0–36.0)
MCHC: 35 g/dL (ref 30.0–36.0)
MCV: 96 fL (ref 78.0–100.0)
MCV: 96.6 fL (ref 78.0–100.0)
Platelets: ADEQUATE 10*3/uL (ref 150–400)
Platelets: 314 10*3/uL (ref 150–400)
Platelets: ADEQUATE 10*3/uL (ref 150–400)
RBC: 4.51 MIL/uL (ref 3.87–5.11)
RBC: 4.08 MIL/uL (ref 3.87–5.11)
RDW: 13.6 % (ref 11.5–15.5)
RDW: 13.7 % (ref 11.5–15.5)
WBC: 9.9 10*3/uL (ref 4.0–10.5)

## 2011-01-24 LAB — COMPREHENSIVE METABOLIC PANEL
ALT: 10 U/L (ref 0–35)
AST: 18 U/L (ref 0–37)
Albumin: 3.7 g/dL (ref 3.5–5.2)
Albumin: 3.4 g/dL — ABNORMAL LOW (ref 3.5–5.2)
Alkaline Phosphatase: 104 U/L (ref 39–117)
Alkaline Phosphatase: 99 U/L (ref 39–117)
BUN: 2 mg/dL — ABNORMAL LOW (ref 6–23)
CO2: 26 mEq/L (ref 19–32)
CO2: 25 mEq/L (ref 19–32)
Calcium: 9 mg/dL (ref 8.4–10.5)
Calcium: 9 mg/dL (ref 8.4–10.5)
Chloride: 106 mEq/L (ref 96–112)
Chloride: 108 mEq/L (ref 96–112)
Creatinine, Ser: 0.58 mg/dL (ref 0.4–1.2)
Creatinine, Ser: 0.64 mg/dL (ref 0.4–1.2)
GFR calc Af Amer: >60 mL/min (ref >60)
GFR calc non Af Amer: >60 mL/min (ref >60)
GFR calc non Af Amer: >60 mL/min (ref >60)
Glucose, Bld: 87 mg/dL (ref 70–99)
Glucose, Bld: 91 mg/dL (ref 70–99)
Potassium: 4.4 mEq/L (ref 3.5–5.1)
Potassium: 3.7 mEq/L (ref 3.5–5.1)
Sodium: 138 mEq/L (ref 135–145)
Total Bilirubin: 1.3 mg/dL — ABNORMAL HIGH (ref 0.3–1.2)
Total Bilirubin: 0.5 mg/dL (ref 0.3–1.2)
Total Protein: 6.5 g/dL (ref 6.0–8.3)

## 2011-01-24 LAB — URINE MICROSCOPIC-ADD ON

## 2011-01-24 LAB — PREGNANCY, URINE: Preg Test, Ur: NEGATIVE

## 2011-01-24 LAB — LIPASE, BLOOD
Lipase: 17 U/L (ref 11–59)
Lipase: 17 U/L (ref 11–59)

## 2011-01-24 LAB — DIFFERENTIAL
Basophils Absolute: 0.1 10*3/uL (ref 0.0–0.1)
Basophils Absolute: 0.1 10*3/uL (ref 0.0–0.1)
Basophils Absolute: 0.1 10*3/uL (ref 0.0–0.1)
Basophils Relative: 1 % (ref 0–1)
Basophils Relative: 1 % (ref 0–1)
Eosinophils Absolute: 0.1 10*3/uL (ref 0.0–0.7)
Eosinophils Absolute: 0.2 10*3/uL (ref 0.0–0.7)
Eosinophils Relative: 1 % (ref 0–5)
Eosinophils Relative: 2 % (ref 0–5)
Lymphocytes Relative: 28 % (ref 12–46)
Lymphocytes Relative: 38 % (ref 12–46)
Lymphs Abs: 2.4 10*3/uL (ref 0.7–4.0)
Lymphs Abs: 3.9 10*3/uL (ref 0.7–4.0)
Monocytes Absolute: 0.5 10*3/uL (ref 0.1–1.0)
Monocytes Absolute: 0.6 10*3/uL (ref 0.1–1.0)
Monocytes Relative: 5 % (ref 3–12)
Monocytes Relative: 5 % (ref 3–12)
Neutro Abs: 6.1 10*3/uL (ref 1.7–7.7)
Neutro Abs: 4.7 10*3/uL (ref 1.7–7.7)
Neutro Abs: 5.2 10*3/uL (ref 1.7–7.7)
Neutrophils Relative %: 52 % (ref 43–77)
Neutrophils Relative %: 65 % (ref 43–77)

## 2011-01-24 LAB — BASIC METABOLIC PANEL
BUN: 1 mg/dL — ABNORMAL LOW (ref 6–23)
CO2: 31 mEq/L (ref 19–32)
Calcium: 8.6 mg/dL (ref 8.4–10.5)
Glucose, Bld: 84 mg/dL (ref 70–99)
Sodium: 139 mEq/L (ref 135–145)

## 2011-01-24 LAB — PROTIME-INR: Prothrombin Time: 13.4 seconds (ref 11.6–15.2)

## 2011-01-25 LAB — COMPREHENSIVE METABOLIC PANEL
ALT: 35 U/L (ref 0–35)
CO2: 31 mEq/L (ref 19–32)
Calcium: 9 mg/dL (ref 8.4–10.5)
GFR calc non Af Amer: >60 mL/min (ref >60)
Glucose, Bld: 94 mg/dL (ref 70–99)
Sodium: 139 mEq/L (ref 135–145)

## 2011-01-25 LAB — CBC
Hemoglobin: 14.4 g/dL (ref 12.0–15.0)
MCHC: 34.1 g/dL (ref 30.0–36.0)
MCV: 96.6 fL (ref 78.0–100.0)
RBC: 4.39 MIL/uL (ref 3.87–5.11)

## 2011-01-25 LAB — POCT CARDIAC MARKERS: Troponin i, poc: <0.05 ng/mL (ref 0.00–0.09)

## 2011-01-25 LAB — URINALYSIS, ROUTINE W REFLEX MICROSCOPIC
Ketones, ur: NEGATIVE mg/dL
Leukocytes, UA: NEGATIVE
Nitrite: NEGATIVE
Protein, ur: NEGATIVE mg/dL
Urobilinogen, UA: 0.2 mg/dL (ref 0.0–1.0)

## 2011-01-25 LAB — DIFFERENTIAL
Basophils Absolute: 0.1 10*3/uL (ref 0.0–0.1)
Basophils Relative: 1 % (ref 0–1)
Eosinophils Absolute: 0.2 10*3/uL (ref 0.0–0.7)
Lymphs Abs: 3.5 10*3/uL (ref 0.7–4.0)
Neutrophils Relative %: 61 % (ref 43–77)

## 2011-01-25 LAB — GC/CHLAMYDIA PROBE AMP, GENITAL
Chlamydia, DNA Probe: NEGATIVE
GC Probe Amp, Genital: NEGATIVE

## 2011-01-25 LAB — WET PREP, GENITAL

## 2011-01-25 LAB — D-DIMER, QUANTITATIVE: D-Dimer, Quant: <0.22 ug/mL-FEU (ref 0.00–0.48)

## 2011-01-27 LAB — POCT I-STAT, CHEM 8
BUN: <3 mg/dL — ABNORMAL LOW (ref 6–23)
Calcium, Ion: 1.08 mmol/L — ABNORMAL LOW (ref 1.12–1.32)
Chloride: 105 meq/L (ref 96–112)
Creatinine, Ser: 0.7 mg/dL (ref 0.4–1.2)
Glucose, Bld: 92 mg/dL (ref 70–99)
HCT: 42 % (ref 36.0–46.0)
Hemoglobin: 14.3 g/dL (ref 12.0–15.0)
Potassium: 2.9 meq/L — ABNORMAL LOW (ref 3.5–5.1)
Sodium: 142 meq/L (ref 135–145)
TCO2: 28 mmol/L (ref 0–100)

## 2011-01-27 LAB — BASIC METABOLIC PANEL WITH GFR
BUN: 2 mg/dL — ABNORMAL LOW (ref 6–23)
BUN: 4 mg/dL — ABNORMAL LOW (ref 6–23)
CO2: 23 meq/L (ref 19–32)
CO2: 27 meq/L (ref 19–32)
Calcium: 7.8 mg/dL — ABNORMAL LOW (ref 8.4–10.5)
Calcium: 8.9 mg/dL (ref 8.4–10.5)
Chloride: 104 meq/L (ref 96–112)
Chloride: 111 meq/L (ref 96–112)
Creatinine, Ser: 0.49 mg/dL (ref 0.4–1.2)
Creatinine, Ser: 0.61 mg/dL (ref 0.4–1.2)
GFR calc non Af Amer: >60 mL/min
GFR calc non Af Amer: >60 mL/min
Glucose, Bld: 74 mg/dL (ref 70–99)
Glucose, Bld: 94 mg/dL (ref 70–99)
Potassium: 2.5 meq/L — CL (ref 3.5–5.1)
Potassium: 4.2 meq/L (ref 3.5–5.1)
Sodium: 138 meq/L (ref 135–145)
Sodium: 140 meq/L (ref 135–145)

## 2011-01-27 LAB — DIFFERENTIAL
Basophils Absolute: 0 10*3/uL (ref 0.0–0.1)
Basophils Relative: 1 % (ref 0–1)
Eosinophils Absolute: 0.1 10*3/uL (ref 0.0–0.7)
Neutro Abs: 5 10*3/uL (ref 1.7–7.7)
Neutrophils Relative %: 53 % (ref 43–77)

## 2011-01-27 LAB — COMPREHENSIVE METABOLIC PANEL
ALT: 13 U/L (ref 0–35)
Alkaline Phosphatase: 74 U/L (ref 39–117)
BUN: 6 mg/dL (ref 6–23)
CO2: 26 mEq/L (ref 19–32)
Chloride: 104 mEq/L (ref 96–112)
Glucose, Bld: 95 mg/dL (ref 70–99)
Potassium: 3.5 mEq/L (ref 3.5–5.1)
Total Bilirubin: 0.3 mg/dL (ref 0.3–1.2)

## 2011-01-27 LAB — POCT PREGNANCY, URINE: Preg Test, Ur: NEGATIVE

## 2011-01-27 LAB — CBC
HCT: 39.9 % (ref 36.0–46.0)
Hemoglobin: 13.8 g/dL (ref 12.0–15.0)
MCV: 99 fL (ref 78.0–100.0)
Platelets: 222 10*3/uL (ref 150–400)
RDW: 13.6 % (ref 11.5–15.5)

## 2011-01-27 LAB — URINALYSIS, ROUTINE W REFLEX MICROSCOPIC
Leukocytes, UA: NEGATIVE
Nitrite: NEGATIVE
Protein, ur: NEGATIVE mg/dL
Urobilinogen, UA: 0.2 mg/dL (ref 0.0–1.0)

## 2011-01-27 LAB — URINE MICROSCOPIC-ADD ON

## 2011-01-30 LAB — DIFFERENTIAL
Basophils Absolute: 0.1 10*3/uL (ref 0.0–0.1)
Basophils Relative: 1 % (ref 0–1)
Basophils Relative: 1 % (ref 0–1)
Eosinophils Absolute: 0.1 10*3/uL (ref 0.0–0.7)
Eosinophils Absolute: 0.2 10*3/uL (ref 0.0–0.7)
Eosinophils Relative: 2 % (ref 0–5)
Lymphocytes Relative: 46 % (ref 12–46)
Lymphocytes Relative: 50 % — ABNORMAL HIGH (ref 12–46)
Lymphs Abs: 3.8 10*3/uL (ref 0.7–4.0)
Lymphs Abs: 4.2 10*3/uL — ABNORMAL HIGH (ref 0.7–4.0)
Monocytes Relative: 6 % (ref 3–12)
Neutro Abs: 3.5 10*3/uL (ref 1.7–7.7)
Neutro Abs: 4.3 10*3/uL (ref 1.7–7.7)
Neutrophils Relative %: 42 % — ABNORMAL LOW (ref 43–77)
Neutrophils Relative %: 49 % (ref 43–77)

## 2011-01-30 LAB — URINALYSIS, ROUTINE W REFLEX MICROSCOPIC
Bilirubin Urine: NEGATIVE
Bilirubin Urine: NEGATIVE
Bilirubin Urine: NEGATIVE
Bilirubin Urine: NEGATIVE
Glucose, UA: NEGATIVE mg/dL
Ketones, ur: NEGATIVE mg/dL
Ketones, ur: NEGATIVE mg/dL
Ketones, ur: NEGATIVE mg/dL
Leukocytes, UA: NEGATIVE
Leukocytes, UA: NEGATIVE
Nitrite: NEGATIVE
Nitrite: NEGATIVE
Nitrite: NEGATIVE
Nitrite: NEGATIVE
Protein, ur: NEGATIVE mg/dL
Protein, ur: NEGATIVE mg/dL
Specific Gravity, Urine: 1.003 — ABNORMAL LOW (ref 1.005–1.030)
Specific Gravity, Urine: 1.004 — ABNORMAL LOW (ref 1.005–1.030)
Urobilinogen, UA: 0.2 mg/dL (ref 0.0–1.0)
Urobilinogen, UA: 0.2 mg/dL (ref 0.0–1.0)
Urobilinogen, UA: 1 mg/dL (ref 0.0–1.0)
pH: 6 (ref 5.0–8.0)
pH: 6.5 (ref 5.0–8.0)

## 2011-01-30 LAB — CBC
HCT: 40 % (ref 36.0–46.0)
HCT: 40.2 % (ref 36.0–46.0)
HCT: 41.9 % (ref 36.0–46.0)
Hemoglobin: 13.9 g/dL (ref 12.0–15.0)
Hemoglobin: 14 g/dL (ref 12.0–15.0)
Hemoglobin: 14.3 g/dL (ref 12.0–15.0)
MCHC: 34.1 g/dL (ref 30.0–36.0)
MCHC: 34.8 g/dL (ref 30.0–36.0)
MCV: 96.7 fL (ref 78.0–100.0)
MCV: 96.9 fL (ref 78.0–100.0)
RBC: 4.14 MIL/uL (ref 3.87–5.11)
RBC: 4.33 MIL/uL (ref 3.87–5.11)
RDW: 13.5 % (ref 11.5–15.5)
RDW: 13.6 % (ref 11.5–15.5)
WBC: 9.5 10*3/uL (ref 4.0–10.5)

## 2011-01-30 LAB — COMPREHENSIVE METABOLIC PANEL
ALT: 14 U/L (ref 0–35)
ALT: 14 U/L (ref 0–35)
ALT: 17 U/L (ref 0–35)
AST: 18 U/L (ref 0–37)
Albumin: 3.3 g/dL — ABNORMAL LOW (ref 3.5–5.2)
Albumin: 3.4 g/dL — ABNORMAL LOW (ref 3.5–5.2)
Alkaline Phosphatase: 98 U/L (ref 39–117)
BUN: 3 mg/dL — ABNORMAL LOW (ref 6–23)
BUN: <1 mg/dL — ABNORMAL LOW (ref 6–23)
CO2: 27 mEq/L (ref 19–32)
CO2: 30 mEq/L (ref 19–32)
Calcium: 8.7 mg/dL (ref 8.4–10.5)
Calcium: 8.8 mg/dL (ref 8.4–10.5)
Calcium: 8.8 mg/dL (ref 8.4–10.5)
Chloride: 102 mEq/L (ref 96–112)
Creatinine, Ser: 0.56 mg/dL (ref 0.4–1.2)
Creatinine, Ser: 0.59 mg/dL (ref 0.4–1.2)
Creatinine, Ser: 0.75 mg/dL (ref 0.4–1.2)
GFR calc Af Amer: >60 mL/min (ref >60)
GFR calc Af Amer: >60 mL/min (ref >60)
GFR calc non Af Amer: >60 mL/min (ref >60)
GFR calc non Af Amer: >60 mL/min (ref >60)
Glucose, Bld: 69 mg/dL — ABNORMAL LOW (ref 70–99)
Glucose, Bld: 86 mg/dL (ref 70–99)
Glucose, Bld: 87 mg/dL (ref 70–99)
Glucose, Bld: 93 mg/dL (ref 70–99)
Potassium: 3.3 mEq/L — ABNORMAL LOW (ref 3.5–5.1)
Sodium: 138 mEq/L (ref 135–145)
Sodium: 140 mEq/L (ref 135–145)
Sodium: 141 mEq/L (ref 135–145)
Total Bilirubin: 0.5 mg/dL (ref 0.3–1.2)
Total Protein: 5.6 g/dL — ABNORMAL LOW (ref 6.0–8.3)
Total Protein: 6.2 g/dL (ref 6.0–8.3)
Total Protein: 6.3 g/dL (ref 6.0–8.3)
Total Protein: 6.3 g/dL (ref 6.0–8.3)

## 2011-01-30 LAB — LIPASE, BLOOD
Lipase: 22 U/L (ref 11–59)
Lipase: 28 U/L (ref 11–59)
Lipase: 99 U/L — ABNORMAL HIGH (ref 11–59)

## 2011-01-30 LAB — GLUCOSE, CAPILLARY: Glucose-Capillary: 70 mg/dL (ref 70–99)

## 2011-02-06 LAB — DIFFERENTIAL
Basophils Relative: 1 % (ref 0–1)
Basophils Relative: 1 % (ref 0–1)
Eosinophils Absolute: 0.1 10*3/uL (ref 0.0–0.7)
Lymphocytes Relative: 50 % — ABNORMAL HIGH (ref 12–46)
Monocytes Absolute: 0.5 10*3/uL (ref 0.1–1.0)
Monocytes Relative: 4 % (ref 3–12)
Monocytes Relative: 5 % (ref 3–12)
Neutro Abs: 3.4 10*3/uL (ref 1.7–7.7)
Neutrophils Relative %: 43 % (ref 43–77)

## 2011-02-06 LAB — COMPREHENSIVE METABOLIC PANEL
AST: 26 U/L (ref 0–37)
Albumin: 4 g/dL (ref 3.5–5.2)
Calcium: 9.1 mg/dL (ref 8.4–10.5)
Creatinine, Ser: 0.58 mg/dL (ref 0.4–1.2)
GFR calc Af Amer: >60 mL/min (ref >60)
Total Protein: 6.8 g/dL (ref 6.0–8.3)

## 2011-02-06 LAB — BASIC METABOLIC PANEL
BUN: 2 mg/dL — ABNORMAL LOW (ref 6–23)
BUN: <1 mg/dL — ABNORMAL LOW (ref 6–23)
CO2: 29 mEq/L (ref 19–32)
CO2: 29 mEq/L (ref 19–32)
Chloride: 103 mEq/L (ref 96–112)
Chloride: 105 mEq/L (ref 96–112)
Creatinine, Ser: 0.55 mg/dL (ref 0.4–1.2)
Creatinine, Ser: 0.58 mg/dL (ref 0.4–1.2)
Glucose, Bld: 84 mg/dL (ref 70–99)

## 2011-02-06 LAB — LIPASE, BLOOD: Lipase: 17 U/L (ref 11–59)

## 2011-02-06 LAB — CBC
MCHC: 34.3 g/dL (ref 30.0–36.0)
MCHC: 34.8 g/dL (ref 30.0–36.0)
MCV: 95.9 fL (ref 78.0–100.0)
MCV: 96.4 fL (ref 78.0–100.0)
Platelets: 210 10*3/uL (ref 150–400)
Platelets: ADEQUATE 10*3/uL (ref 150–400)
RDW: 14.7 % (ref 11.5–15.5)
RDW: 14.9 % (ref 11.5–15.5)
WBC: 7.9 10*3/uL (ref 4.0–10.5)

## 2011-02-06 LAB — POCT CARDIAC MARKERS
CKMB, poc: <1 ng/mL — ABNORMAL LOW (ref 1.0–8.0)
Myoglobin, poc: 41.6 ng/mL (ref 12–200)
Troponin i, poc: <0.05 ng/mL (ref 0.00–0.09)

## 2011-02-06 LAB — CARDIAC PANEL(CRET KIN+CKTOT+MB+TROPI)
CK, MB: 0.5 ng/mL (ref 0.3–4.0)
Total CK: 40 U/L (ref 7–177)
Total CK: 59 U/L (ref 7–177)
Troponin I: 0.01 ng/mL (ref 0.00–0.06)

## 2011-02-06 LAB — LIPID PANEL
LDL Cholesterol: 85 mg/dL (ref 0–99)
Triglycerides: 68 mg/dL (ref <150)
VLDL: 14 mg/dL (ref 0–40)

## 2011-02-06 LAB — D-DIMER, QUANTITATIVE: D-Dimer, Quant: <0.22 ug/mL-FEU (ref 0.00–0.48)

## 2011-02-06 LAB — CK TOTAL AND CKMB (NOT AT ARMC): Relative Index: INVALID (ref 0.0–2.5)

## 2011-02-06 LAB — TSH: TSH: 4.936 u[IU]/mL — ABNORMAL HIGH (ref 0.350–4.500)

## 2011-02-07 LAB — URINALYSIS, ROUTINE W REFLEX MICROSCOPIC
Bilirubin Urine: NEGATIVE
Bilirubin Urine: NEGATIVE
Ketones, ur: NEGATIVE mg/dL
Ketones, ur: NEGATIVE mg/dL
Nitrite: NEGATIVE
Protein, ur: NEGATIVE mg/dL
Protein, ur: NEGATIVE mg/dL
Specific Gravity, Urine: 1.011 (ref 1.005–1.030)
Urobilinogen, UA: 1 mg/dL (ref 0.0–1.0)
Urobilinogen, UA: 1 mg/dL (ref 0.0–1.0)

## 2011-02-07 LAB — COMPREHENSIVE METABOLIC PANEL
Albumin: 3.5 g/dL (ref 3.5–5.2)
Alkaline Phosphatase: 67 U/L (ref 39–117)
BUN: 6 mg/dL (ref 6–23)
Calcium: 9 mg/dL (ref 8.4–10.5)
Glucose, Bld: 94 mg/dL (ref 70–99)
Potassium: 3.5 mEq/L (ref 3.5–5.1)
Sodium: 141 mEq/L (ref 135–145)
Total Protein: 6 g/dL (ref 6.0–8.3)

## 2011-02-07 LAB — CBC
HCT: 39.1 % (ref 36.0–46.0)
Hemoglobin: 14.7 g/dL (ref 12.0–15.0)
MCHC: 35.2 g/dL (ref 30.0–36.0)
MCV: 95.3 fL (ref 78.0–100.0)
Platelets: 248 10*3/uL (ref 150–400)
RBC: 4.44 MIL/uL (ref 3.87–5.11)
RDW: 14.3 % (ref 11.5–15.5)
WBC: 8.6 10*3/uL (ref 4.0–10.5)

## 2011-02-07 LAB — DIFFERENTIAL
Basophils Absolute: 0 10*3/uL (ref 0.0–0.1)
Eosinophils Absolute: 0 10*3/uL (ref 0.0–0.7)
Eosinophils Absolute: 0.1 10*3/uL (ref 0.0–0.7)
Eosinophils Relative: 1 % (ref 0–5)
Lymphs Abs: 3.2 10*3/uL (ref 0.7–4.0)
Monocytes Relative: 5 % (ref 3–12)
Neutro Abs: 4.8 10*3/uL (ref 1.7–7.7)
Neutrophils Relative %: 55 % (ref 43–77)
Neutrophils Relative %: 56 % (ref 43–77)

## 2011-02-07 LAB — HEPATIC FUNCTION PANEL
AST: 20 U/L (ref 0–37)
Albumin: 4 g/dL (ref 3.5–5.2)
Alkaline Phosphatase: 79 U/L (ref 39–117)
Total Bilirubin: 1.5 mg/dL — ABNORMAL HIGH (ref 0.3–1.2)

## 2011-02-07 LAB — LIPASE, BLOOD: Lipase: 23 U/L (ref 11–59)

## 2011-02-07 LAB — BASIC METABOLIC PANEL
Chloride: 102 mEq/L (ref 96–112)
Creatinine, Ser: 0.6 mg/dL (ref 0.4–1.2)
GFR calc Af Amer: >60 mL/min (ref >60)
Sodium: 139 mEq/L (ref 135–145)

## 2011-02-07 LAB — URINE MICROSCOPIC-ADD ON

## 2011-02-08 LAB — DIFFERENTIAL
Basophils Absolute: 0.1 10*3/uL (ref 0.0–0.1)
Eosinophils Absolute: 0.1 10*3/uL (ref 0.0–0.7)
Eosinophils Relative: 1 % (ref 0–5)
Lymphocytes Relative: 21 % (ref 12–46)
Lymphs Abs: 2.6 10*3/uL (ref 0.7–4.0)
Monocytes Relative: 3 % (ref 3–12)
Neutro Abs: 9.4 10*3/uL — ABNORMAL HIGH (ref 1.7–7.7)
Neutrophils Relative %: 75 % (ref 43–77)

## 2011-02-08 LAB — BASIC METABOLIC PANEL
Calcium: 8 mg/dL — ABNORMAL LOW (ref 8.4–10.5)
Calcium: 8.3 mg/dL — ABNORMAL LOW (ref 8.4–10.5)
Calcium: 8.4 mg/dL (ref 8.4–10.5)
Creatinine, Ser: 0.74 mg/dL (ref 0.4–1.2)
GFR calc Af Amer: >60 mL/min (ref >60)
GFR calc Af Amer: >60 mL/min (ref >60)
GFR calc non Af Amer: >60 mL/min (ref >60)
GFR calc non Af Amer: >60 mL/min (ref >60)
GFR calc non Af Amer: >60 mL/min (ref >60)
Glucose, Bld: 133 mg/dL — ABNORMAL HIGH (ref 70–99)
Glucose, Bld: 98 mg/dL (ref 70–99)
Potassium: 2.9 mEq/L — ABNORMAL LOW (ref 3.5–5.1)
Potassium: 3.2 mEq/L — ABNORMAL LOW (ref 3.5–5.1)
Sodium: 138 mEq/L (ref 135–145)
Sodium: 140 mEq/L (ref 135–145)
Sodium: 141 mEq/L (ref 135–145)

## 2011-02-08 LAB — CBC
HCT: 38.2 % (ref 36.0–46.0)
HCT: 48.3 % — ABNORMAL HIGH (ref 36.0–46.0)
HCT: 34.7 % — ABNORMAL LOW (ref 36.0–46.0)
HCT: 41.7 % (ref 36.0–46.0)
Hemoglobin: 12.1 g/dL (ref 12.0–15.0)
Hemoglobin: 12.8 g/dL (ref 12.0–15.0)
Hemoglobin: 14.4 g/dL (ref 12.0–15.0)
Hemoglobin: 14.9 g/dL (ref 12.0–15.0)
MCHC: 34.3 g/dL (ref 30.0–36.0)
MCV: 96.5 fL (ref 78.0–100.0)
Platelets: 316 10*3/uL (ref 150–400)
Platelets: 369 10*3/uL (ref 150–400)
Platelets: 219 10*3/uL (ref 150–400)
RBC: 3.66 MIL/uL — ABNORMAL LOW (ref 3.87–5.11)
RBC: 4.52 MIL/uL (ref 3.87–5.11)
RDW: 14.1 % (ref 11.5–15.5)
RDW: 13.4 % (ref 11.5–15.5)
RDW: 13.5 % (ref 11.5–15.5)
WBC: 9.4 10*3/uL (ref 4.0–10.5)
WBC: 10 10*3/uL (ref 4.0–10.5)
WBC: 6.7 10*3/uL (ref 4.0–10.5)

## 2011-02-08 LAB — COMPREHENSIVE METABOLIC PANEL
ALT: 10 U/L (ref 0–35)
ALT: 14 U/L (ref 0–35)
ALT: 16 U/L (ref 0–35)
AST: 21 U/L (ref 0–37)
AST: 20 U/L (ref 0–37)
Albumin: 3.5 g/dL (ref 3.5–5.2)
Albumin: 3.6 g/dL (ref 3.5–5.2)
Albumin: 3.8 g/dL (ref 3.5–5.2)
Alkaline Phosphatase: 67 U/L (ref 39–117)
Alkaline Phosphatase: 81 U/L (ref 39–117)
Alkaline Phosphatase: 86 U/L (ref 39–117)
BUN: 3 mg/dL — ABNORMAL LOW (ref 6–23)
BUN: 6 mg/dL (ref 6–23)
BUN: 8 mg/dL (ref 6–23)
Calcium: 7.8 mg/dL — ABNORMAL LOW (ref 8.4–10.5)
Calcium: 9 mg/dL (ref 8.4–10.5)
Chloride: 105 mEq/L (ref 96–112)
Chloride: 103 mEq/L (ref 96–112)
Creatinine, Ser: 0.55 mg/dL (ref 0.4–1.2)
GFR calc Af Amer: >60 mL/min (ref >60)
GFR calc non Af Amer: >60 mL/min (ref >60)
Glucose, Bld: 104 mg/dL — ABNORMAL HIGH (ref 70–99)
Glucose, Bld: 122 mg/dL — ABNORMAL HIGH (ref 70–99)
Glucose, Bld: 96 mg/dL (ref 70–99)
Potassium: 3.4 mEq/L — ABNORMAL LOW (ref 3.5–5.1)
Potassium: 3.1 mEq/L — ABNORMAL LOW (ref 3.5–5.1)
Potassium: 3.2 mEq/L — ABNORMAL LOW (ref 3.5–5.1)
Sodium: 139 mEq/L (ref 135–145)
Sodium: 135 mEq/L (ref 135–145)
Sodium: 136 mEq/L (ref 135–145)
Total Bilirubin: 0.7 mg/dL (ref 0.3–1.2)
Total Bilirubin: 1 mg/dL (ref 0.3–1.2)
Total Protein: 5.6 g/dL — ABNORMAL LOW (ref 6.0–8.3)
Total Protein: 6.8 g/dL (ref 6.0–8.3)

## 2011-02-08 LAB — CLOSTRIDIUM DIFFICILE EIA: C difficile Toxins A+B, EIA: NEGATIVE

## 2011-02-08 LAB — URINALYSIS, ROUTINE W REFLEX MICROSCOPIC
Glucose, UA: NEGATIVE mg/dL
Ketones, ur: >80 mg/dL — AB
Nitrite: NEGATIVE
Specific Gravity, Urine: 1.03 (ref 1.005–1.030)
Specific Gravity, Urine: 1.031 — ABNORMAL HIGH (ref 1.005–1.030)
Urobilinogen, UA: 1 mg/dL (ref 0.0–1.0)
pH: 7 (ref 5.0–8.0)
pH: 8.5 — ABNORMAL HIGH (ref 5.0–8.0)

## 2011-02-08 LAB — URINE MICROSCOPIC-ADD ON

## 2011-02-08 LAB — POCT I-STAT, CHEM 8
Hemoglobin: 17.7 g/dL — ABNORMAL HIGH (ref 12.0–15.0)
Sodium: 142 mEq/L (ref 135–145)
TCO2: 31 mmol/L (ref 0–100)

## 2011-02-08 LAB — LIPASE, BLOOD
Lipase: 25 U/L (ref 11–59)
Lipase: 14 U/L (ref 11–59)

## 2011-02-08 LAB — POCT PREGNANCY, URINE: Preg Test, Ur: NEGATIVE

## 2011-02-08 LAB — HEPATIC FUNCTION PANEL
Albumin: 3.8 g/dL (ref 3.5–5.2)
Total Bilirubin: 0.6 mg/dL (ref 0.3–1.2)
Total Protein: 6.8 g/dL (ref 6.0–8.3)

## 2011-02-11 LAB — POCT PREGNANCY, URINE: Preg Test, Ur: NEGATIVE

## 2011-02-14 LAB — COMPREHENSIVE METABOLIC PANEL
AST: 17 U/L (ref 0–37)
Albumin: 3.8 g/dL (ref 3.5–5.2)
Alkaline Phosphatase: 89 U/L (ref 39–117)
BUN: 9 mg/dL (ref 6–23)
Chloride: 107 mEq/L (ref 96–112)
Creatinine, Ser: 0.68 mg/dL (ref 0.4–1.2)
GFR calc Af Amer: >60 mL/min (ref >60)
Potassium: 3.1 mEq/L — ABNORMAL LOW (ref 3.5–5.1)
Total Bilirubin: 0.8 mg/dL (ref 0.3–1.2)
Total Protein: 6 g/dL (ref 6.0–8.3)

## 2011-02-14 LAB — URINE CULTURE: Colony Count: >=100000

## 2011-02-14 LAB — ETHANOL: Alcohol, Ethyl (B): <5 mg/dL (ref 0–10)

## 2011-02-14 LAB — URINALYSIS, ROUTINE W REFLEX MICROSCOPIC
Ketones, ur: 15 mg/dL — AB
Nitrite: NEGATIVE
Protein, ur: 30 mg/dL — AB
Urobilinogen, UA: 1 mg/dL (ref 0.0–1.0)

## 2011-02-14 LAB — DIFFERENTIAL
Eosinophils Relative: 0 % (ref 0–5)
Lymphocytes Relative: 19 % (ref 12–46)
Monocytes Absolute: 0.4 10*3/uL (ref 0.1–1.0)
Monocytes Relative: 4 % (ref 3–12)
Neutro Abs: 8.9 10*3/uL — ABNORMAL HIGH (ref 1.7–7.7)

## 2011-02-14 LAB — URINE MICROSCOPIC-ADD ON

## 2011-02-14 LAB — CBC
HCT: 43 % (ref 36.0–46.0)
Platelets: 217 10*3/uL (ref 150–400)
RDW: 12.9 % (ref 11.5–15.5)
WBC: 11.6 10*3/uL — ABNORMAL HIGH (ref 4.0–10.5)

## 2011-03-21 NOTE — Assessment & Plan Note (Signed)
[facility]                                 ON-CALL NOTE   [name], [name]                           MRN:          [id number]  DATE:01/14/2010                            DOB:          [birth year]    This is a patient of Dr. Jeani Hawking, who I am covering for this  weekend.   [name] called today.  She had a colonoscopy with Dr. [name] yesterday,  and she tells me that a small polyp was removed.  She has chronic  abdominal pains dating back many months and possibly many years.  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  She mentioned she  may have pancreatitis problems in the past as well.  She told me that 5  years ago she has suffered a perforation following a colonoscopy with a  different gastroenterologist.  Her pains she describes now are needle-  like razor blade-type pains after she eats in her mid abdomen.  These  are pretty much the same as they have always been for her.  However, she  feels they maybe a bit worse and she thinks she may be tender when she  pushes on her abdomen.  She has had no fevers, but did feel a little  chilled today.   I doubt that anything serious has happened, such as a perforation from  her colonoscopy; however, she told me at least that she had one in the  past and I recommended that she present for back to the emergency room  for evaluation to see if she has indeed suffered a complication from the  colonoscopy.  I told her that if there was no clear free air or  complication, then this is simply her same chronic abdominal pain that  she has been bothered by previously and that she continue her  intermittent antispasmodics and proton pump inhibitors for them.     Rachael Fee, MD     DPJ/MedQ  DD: 01/14/2010  DT: 01/15/2010  Job #: [id number]

## 2011-03-24 NOTE — Discharge Summary (Signed)
NAMESHANDA, CADOTTE                  ACCOUNT NO.:  [id number]   MEDICAL RECORD NO.:  [id number]          PATIENT TYPE:  INP   LOCATION:  3030                         FACILITY:  [facility]   PHYSICIAN:  Fleet Contras, M.D.    DATE OF BIRTH:  11/07/1965   DATE OF ADMISSION:  03/05/2006  DATE OF DISCHARGE:  03/08/2006                                 DISCHARGE SUMMARY   HISTORY OF PRESENTING ILLNESS:  Ms. Reily is a 45 year old Caucasian lady  with past medical history significant for chronic pancreatitis secondary to  cholelithiasis and choledocholithiasis, gastroesophageal reflux disease,  polycystic ovarian syndrome, multiple sclerosis, and depression.  She was  admitted via the emergency room at [facility] with one-week history  of progressively worsening abdominal pain associated with nausea, vomiting,  and watery diarrhea.  She has had similar episodes in the past and  apparently resolved with antibiotic therapy.  She had been evaluated at the  previous hospital, and diagnosed with diverticulosis and diverticulitis, but  this has not been confirmed here.  She was unable to keep any of her food or  drinks down, but there was no blood in her stools or in her vomitus.  She  had no fevers or chills.  The abdominal pain was in the lower abdomen and  mostly crampy in nature and nonradiating.  She denied any use of alcohol or  nonsteroidal antiinflammatory drugs or other illicit drugs.  She is on  oxymorphone for chronic leg pains related to multiple sclerosis, but she  says she was unable to keep her oral medications down due to the nausea and  vomiting.  In the emergency room, she was noted to be in stable condition,  but her initial laboratory data shows some pus cells in her urine.  Her  potassium was 2.6, and she was started on intravenous ciprofloxacin as well  as intravenous fluids.   HOSPITAL COURSE:  She required multiple doses of intravenous analgesic  therapy with IV Dilaudid  for pain relief.  She was also on IV Zofran for  nausea and vomiting.  Her symptoms did improve on this therapeutic plan.  Stools for clostridium difficile toxin were negative x2.  Stool for  Hemoccult blood was also negative.  A CT scan of her abdomen and pelvis was  performed, and except for some mild thickening of the proximal colon without  distention, there was no significant finding and no documentation of  diverticulosis.  A Gastroenterology consult was requested, and the patient  was seen by Dr. Bernette Redbird who suggested that the patient's recurrent  symptoms similarly responsive to antibiotic therapy was suggestive of either  bacterial overgrowth or possibility of celiac disease or microscopic  colitis.  He suggested a trial of hyoscyamine as well as further workup to  include tissue transglutaminase levels to rule out celiac disease.  He also  suggested that if her symptoms improved, a flexible sigmoidoscopy or  colonoscopy could be performed in the outpatient setting.  The patient's  symptoms did improve on conservative management.  Her potassium was repleted  both intravenously  as well as orally.  Today she had an episode of migraine  headache, otherwise her abdomen feels better.  She has less loose stools,  also less frequent.  She only had one episode of vomiting in the last 24  hours.  She is tolerating full diet.   PHYSICAL EXAMINATION:  VITAL SIGNS:  She is afebrile, she is well hydrated,  and her vital signs are stable.  CHEST:  Clear to auscultation.  HEART:  Heart sounds 1 and 2 are heard with no murmurs.  ABDOMEN:  Her abdomen is full, soft, with vague tenderness with no guarding  or rebound tenderness.  Bowel sounds are present.  EXTREMITIES:  No edema,  no calf tenderness.  CNS:  She is alert and oriented x3, with no focal chronological deficits.   Her latest laboratory data shows sodium of 140, potassium 4.1, chloride 107,  bicarbonate of 26, BUN 11.1,  creatinine 0.7, glucose of 87.  White count is  5.5, hemoglobin 12.4, hematocrit 36.2, and platelet count of 225.  AST was  68, ALT 14, alkaline phosphatase 79, total bilirubin 0.5, albumin was 3.  Urine cultures x2 were negative.  A urine pregnancy test was also negative.   ASSESSMENT:  Ms. Michaiah Maiden is a 45 year old Caucasian lady with recurrent  diarrhea illnesses and abdominal pain, seemingly  responsive to antibiotic  therapy.  At this time she has improved on conservative management and is  considered stable for discharge home.   DISCHARGE DIAGNOSES:  1.  Recurrent abdominal pain, vomiting, and diarrhea.  Etiology unclear,      possibilities are bacterial overgrowth in the small bowel, microscopic      colitis, or celiac disease.  2.  Dehydration, resolved.  3.  Hypokalemia, resolved.  4.  Urinary tract infection, mild.  5.  Depression.  This is stable and will require outpatient psychiatric      followup.   FOLLOWUP:  Followup will be with me in two weeks and with Dr. Matthias Hughs or Dr.  Randa Evens in one week to schedule colonoscopy/flexible sigmoidoscopy.  Dr.  Matthias Hughs has suggested that rifaximin should be tried for her next episode as  this is a nonsystemic antibiotic.  Therapy will be effective for bacterial  overgrowth as well as C. diff infection.   DISCHARGE MEDICATIONS:  1.  Nicotine patch 21 mg per day.  2.  Copaxone injection daily.  3.  Zoloft 200 mg q.h.s.  4.  Topamax 100 mg q.h.s.  5.  Protonix 40 mg daily.  6.  Opana ER 20 mg 1 p.o. b.i.d.  7.  Vicodin 5/500 one to two p.o. q.6h. p.r.n.  8.  Xanax 4 mg 1 p.o. q.6h. p.r.n.  9.  Zanaflex 2 mg 1 to 2 p.o. q.6h. p.r.n.  10. Hyoscyamine 0.125 mg sublingual q.6h. p.r.n., #30.  11. Phenergan 25 mg 1 p.o. q.6h. p.r.n., #20.  12. Ciprofloxacin 500 mg b.i.d., #4, to complete five days of treatment.  DISCHARGE INSTRUCTIONS:  She has also been advised to stay on a low residue  diet and to continue with her smoking  cessation effort and to attend mental  health for her depression.   This plan of care has been discussed with her and all her questions have  been answered.      Fleet Contras, M.D.  Electronically Signed     EA/MEDQ  D:  03/08/2006  T:  03/08/2006  Job:  [id number]

## 2011-03-24 NOTE — Discharge Summary (Signed)
[name], [name]                  ACCOUNT NO.:  [id number]   MEDICAL RECORD NO.:  [id number]          PATIENT TYPE:  INP   LOCATION:  6741                         FACILITY:  [facility]   PHYSICIAN:  [name], M.D.DATE OF BIRTH:  Sep 16, 1966   DATE OF ADMISSION:  07/11/2005  DATE OF DISCHARGE:  07/22/2005                                 DISCHARGE SUMMARY   DISCHARGE DIAGNOSES:  1.  Abdominal pain possibly secondary to retroperitoneal perforation at      endoscopic retrograde cholangiopancreatography with retroperitoneal air.  2.  History of cholelithiasis and choledocholithiasis status post multiple      endoscopic retrograde cholangiopancreatography.  3.  Status post cholecystectomy at [facility] three years ago.  4.  Multiple sclerosis.  5.  Neurogenic bladder.  6.  Reflux.  7.  Migraines.  8.  Question of chronic pancreatitis.  9.  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   HISTORY OF PRESENT ILLNESS:  [name] is a 45 year old female who presented  to the hospital with abdominal pain, status post cholecystectomy several  years ago.  She had an ERCP performed by [name] on August 2006 and  developed increasing abdominal pain.  A sphincterotomy was performed at that  time and occlusion cholangiogram and balloon pull-through.  Apparently no  large stones were discovered.  In the emergency room, she had a CT scan  showed retroperitoneal air around her right kidney and right duodenum.  She  was admitted to the hospital July 09, 2005, received antibiotics and was  sent home and readmitted with worsening abdominal pain, nausea and vomiting.   PHYSICAL EXAMINATION:  On admission, she was in no acute distress with  stable vital signs, anicteric sclerae.  Abdomen demonstrated distension and  mild epigastric tenderness, but bowel sounds were present.   HOSPITAL COURSE:  PROBLEM #1 - RETROPERITONEAL PERFORATION:  She received  initially IV  Unasyn.  Her white count was 11.2, but it rapidly came down to  normal.  Abdominal CT scan on July 13, 2005, demonstrated a decrease in  the amount of free air surrounding the right kidney.  The patient was  started on TNA for a few days but described hunger and wished to eat and  with her benign abdomen, clear fluid and then a low fat diet were free air  surrounding the right kidney. The patient was started on TEN a for a few  days but described collar and wished 59 and with a benign abdomen, clear  fluids and then a low fat diet were started without significant problems.  She did have mild intermittent nausea and vomiting but basically was  tolerating her diet.   PROBLEM #2:  In the hospital, she developed  she developed psychosis with  combative behavior, particularly at night.  Neurologic consultation was  obtained with [name] who felt this was not related to her MS.  An EEG  was done which initially suggested possible epileptiform spikes, however, a  repeat EEG was normal.  Dilaudid and Ambien were discontinued, and  the  patient's mental status changes resolved.  An MRI of her brain was normal.  Discharge laboratory tests included a white count of 7.9 a hemoglobin of  11.4.  It is noted that her MCV is 103.  Electrolytes were within normal  limits.  Blood sugar 154.  On discharge creatinine 0.5, vitamin B12 level  1019, prealbumin 19.7.  Urine toxicology at the time of her psychosis was  negative.  Amylase was 33, lipase 97.   DISCHARGE MEDICATIONS:  1.  Lexapro 40 mg every day.  2.  Verapamil 240 mg every day.  3.  Copaxone injections 20 mg every day.  4.  Aciphex 20 mg every day.  5.  Amitriptyline 25 mg q.h.s.  6.  Zanaflex 2 mg three per day p.r.n.  7.  Lortab 10/605 one q.i.d. p.r.n.  8.  Lidocaine patches as previously directed.   DISCHARGE CONDITION:  Improved.   DISCHARGE INSTRUCTIONS:  Follow up with [name] within 7-10 days and with  [name],  neurologist, at the patient's discretion.      [name], M.D.  Electronically Signed     PJS/MEDQ  D:  07/22/2005  T:  07/22/2005  Job:  [id number]

## 2011-03-24 NOTE — Discharge Summary (Signed)
NAMESHAMIRA, TOUTANT                  ACCOUNT NO.:  [id number]   MEDICAL RECORD NO.:  [id number]          PATIENT TYPE:  INP   LOCATION:  5733                         FACILITY:  [facility]   PHYSICIAN:  John C. Madilyn Fireman, M.D.    DATE OF BIRTH:  [birth year]   DATE OF ADMISSION:  07/09/2005  DATE OF DISCHARGE:  07/10/2005                                 DISCHARGE SUMMARY   HISTORY OF ILLNESS:  The patient is a 45 year old white female who underwent  ERCP with sphincterotomy 4 days prior to admission.  She had persistent  abdominal soreness and an episode of nausea and vomiting on the day of  admission.  she came to the emergency room, and was found to have free air  on the KUB between her duodenum and right kidney with no peritoneal signs,  normal vital signs, and normal white blood cell count, and decreased liver  function tests from values prior to her ERCP.  For details, please see  admission history and physical.   COURSE IN THE HOSPITAL:  The patient was started on IV Unasyn, kept NPO.  She had an abdominal CT scan which I reviewed with the radiologist which  showed retroperitoneal air around the right kidney and duodenum with  absolutely no fluid collection or abscess.  I discussed her case with the  general surgeon, and we all felt that she had no persistent duodenal  perforation in light of the CT scans and her stable clinical status and  absence of signs of peritonitis.  It was therefore decided that she would be  safe for discharged on antibiotics with close follow up.   DISCHARGE MEDICATIONS:  1.  Augmentin 875 mg b.i.d.  2.  Dilaudid 4 mg q.6h. for pain.   FOLLOW UP:  She has a lab appointment with Dr. [name] tomorrow, and will also  make a follow up appointment with Dr. [name] this week.  She was instructed  to call immediately if she has increasing abdominal pain, nausea, vomiting,  fever, chills, or abdominal distention.   DISCHARGE DIAGNOSIS:  Possible confined duodenal  procedure-related  perforation without frank abscess or peritonitis.   CONDITION ON DISCHARGE:  Improved.           ______________________________  Everardo All Madilyn Fireman, M.D.     JCH/MEDQ  D:  07/10/2005  T:  07/10/2005  Job:  [id number]   cc:   Petra Kuba, M.D.  [address], [address]  [place]  [place] [address]  Fax: [phone]

## 2011-03-24 NOTE — Consult Note (Signed)
NAMEMICHIKO, [name]                  ACCOUNT NO.:  [id number]   MEDICAL RECORD NO.:  [id number]          PATIENT TYPE:  INP   LOCATION:  6741                         FACILITY:  [facility]   PHYSICIAN:  [name], M.D.     DATE OF BIRTH:  [birth year]   DATE OF CONSULTATION:  07/11/2005  DATE OF DISCHARGE:                                   CONSULTATION   HISTORY OF PRESENT ILLNESS:  [name] is a 45 year old female with a remote  history of cholecystectomy which she states was performed in [place]  years ago.  She has had right upper quadrant pain intermittently since that  time and has required multiple ERCPs which have been performed at [facility] more  than 3 years ago.  Over the past month she has had an increasing pain and  underwent an ERCP on July 04, 2005, by Dr. [name].  There were some  questionable common bile duct stones and a sphincterotomy was performed.  Since the time of that procedure she has had more problems, including right  upper quadrant pain, nausea and vomiting.  She was admitted to the hospital  on September 3 through July 10, 2005.  A CT scan showed retroperitoneal  air around the right kidney and this was felt to have been procedure related  (duodenal).  She was discharged home on antibiotics.  Unfortunately, she  awoke again on the date of admission with severe abdominal pain and she was  readmitted.  She has been placed on IV Unasyn, IV fluids, kept n.p.o. and  has IV pain control ordered.  We are asked to follow this patient with Dr.  Randa Evens.   ALLERGIES:  ERYTHROMYCIN CAUSES GI UPSET.   MEDICATIONS:  Dilaudid, Unasyn, Lexapro, verapamil, Ambien and Caparone.   SOCIAL HISTORY:  She has 5 children ages 62 and under.  She is a former  Engineer, civil (consulting) but is currently on disability secondary to her multiple sclerosis.  She lives in [place].  She does have a history of smoking but no alcohol  or illicit drug use.   FAMILY HISTORY:  [name] has ALS, Mom has  breast cancer.   PAST MEDICAL HISTORY:  1.  Multiple sclerosis.  2.  Neurogenic bladder.  3.  Chronic pancreatitis.  4.  She has undergone a cholecystectomy and inguinal hernia repair.  5.  She has migraines as well as reflux.   VITAL SIGNS:  Tmax 99.4, pulse 90, respirations 18, blood pressure 128/77,  O2 saturations 98% on room air.   REVIEW OF SYSTEMS:  As above, otherwise negative.   HEENT:  Grossly normal, no carotid or subclavian bruits.  No JVD or  thyromegaly.  Sclera clear, conjunctiva normal.  Nares without drainage.  HEART:  Regular rate and rhythm, no gross murmur.  CHEST:  Clear to auscultation bilaterally.  ABDOMEN:  Tender in the right upper quadrant but she complains of soreness  all over.  EXTREMITIES:  No peripheral edema.  Good lower extremity perfusion.  NEURO:  Grossly intact.  SKIN:  Warm and dry.   LABORATORY STUDIES AT TIME  OF EXAM:  Show an elevated white count of 11,200.  CMET, amylase and lipase are currently pending.   ASSESSMENT AND PLAN:  1.  Abdominal pain, recent endoscopic retrograde cholangiopancreatography      and sphincterotomy now with retroperitoneal at the right kidney      (duodenum).  Will continue to watch clinically.  She will be kept on IV      fluids, IV pain medications, IV antibiotics and kept n.p.o.  Will      continue to follow the patient for  symptoms and we will be available      for surgical intervention if needed.  Will continue to follow her white      blood cell count and I have recommended a repeat CT scan of her abdomen      in the next 1-2 days.  2.  Multiple sclerosis.  3.  Neurogenic bladder.  4.  Migraines.      Guy Franco, P.A.      ______________________________  [name], M.D.    LB/MEDQ  D:  07/13/2005  T:  07/13/2005  Job:  [id number]   cc:   Alicia Fearing L. Malon Kindle., M.D.  Fax: [phone]   [name], M.D.  [address]  [place]  [place] [address]

## 2011-03-24 NOTE — Discharge Summary (Signed)
NAMEKUULEI, KLEIER                  ACCOUNT NO.:  [id number]   MEDICAL RECORD NO.:  [id number]          PATIENT TYPE:  INP   LOCATION:  1429                         FACILITY:  [facility]   PHYSICIAN:  Fleet Contras, M.D.    DATE OF BIRTH:  1966-07-02   DATE OF ADMISSION:  12/20/2007  DATE OF DISCHARGE:  12/21/2007                               DISCHARGE SUMMARY   HISTORY OF PRESENT ILLNESS:  Ms. Dlugosz is a 46 year old Caucasian lady  with a past medical history significant for migraine headaches,  gastroesophageal reflux disease, anxiety disorder, fibromyalgia and  chronic pain syndrome.  She came to the emergency room with 2 days  history of chest pain associated with nervousness palpitations, anxiety  attacks on and off for about 2 days.  She stated that she had been under  enormous stress lately.  She had no radiation of the chest pain.  She  had no shortness of breath and no vomiting.  She also had some pain in  the head, arm and legs. In the emergency room, her vital signs were  stable.  She stated her chest pain was relieved by sublingual  nitroglycerin but EKG and point of care troponin were negative.  She was  admitted to rule out acute coronary syndrome.   HOSPITAL COURSE:  On admission, the patient was started on intravenous  fluids, analgesic pain medications with IV morphine, anxiolytic agents  and serial CKs and troponin were obtained to rule out acute coronary  syndrome.  The patient did not have any further chest pain but she did  have some headaches and pain in her arms and legs which were not cardiac  related. As soon as her serial CKs were negative, she was considered  stable for discharge home.   ADMISSION DIAGNOSES:  1. Chest pain atypical.  2. Anxiety disorder.  3. Chronic pain syndrome.   DISCHARGE MEDICATIONS:  1. Actonel 1 p.o. monthly.  2. Lexapro 10 mg daily.  3. Prevacid 30 mg daily.  4. Calcium with vitamin D one p.o. daily.  5. Kadian 30 mg every 12  hours.  6. MSIR 50 mg every 6 hours p.r.n.  7. Xanax 1 mg q.h.s. p.r.n.  8. Relpax 40 mg p.r.n. for migraine headaches.  9. Nicotine patch 14 mcg daily.  10.Metanx 1 p.o. daily.  11.Limbrel 1 p.o. daily.   FOLLOWUP:  She was to follow-up with me in the office as previously  arranged.   DISPOSITION:  Home.   CONDITION ON DISCHARGE:  Stable.      Fleet Contras, M.D.  Electronically Signed     EA/MEDQ  D:  01/14/2008  T:  01/16/2008  Job:  [id number]

## 2011-03-24 NOTE — H&P (Signed)
[name], [name]                  ACCOUNT NO.:  [id number]   MEDICAL RECORD NO.:  [id number]          PATIENT TYPE:  AMB   LOCATION:  ENDO                         FACILITY:  [facility]   PHYSICIAN:  Petra Kuba, M.D.    DATE OF BIRTH:  [birth year]   DATE OF ADMISSION:  07/04/2005  DATE OF DISCHARGE:                                HISTORY & PHYSICAL   HISTORY:  Patient is admitted after ERCP/sphincterotomy for observation.  She had a 10 day history of abdominal pain, nausea and vomiting with two ER  visits with significantly elevated liver tests and then seen in the office  both times afterwards, feeling somewhat better with decreasing liver tests.  She has a complicated medical history with a cholecystectomy three years  ago, complicated with some pancreatitis and three ERCP's, the last two  requiring a visit to [facility], where she did have a sphincterotomy and I believe  manometry as well.  She had been asymptomatic for three years, supposedly,  up until recurrence of these symptoms.  She did have a CT scan done, which  was nondiagnostic.   PAST MEDICAL HISTORY:  Pertinent for multiple sclerosis, reflux, migraines,  with a cholecystectomy and ERCP/sphincterotomy, as above, as well as an  inguinal hernia repair.   ALLERGIES:  ERYTHROMYCIN.   PERTINENT MEDICATIONS:  Apazone, AcipHex, Lexapro, verapamil, Zanaflex,  __________, Ultram, Aldactone, and Dilaudid p.r.n. as well as a fentanyl  patch.   FAMILY HISTORY:  Negative for any obvious GI problems.   SOCIAL HISTORY:  She smokes but does not drink.   REVIEW OF SYSTEMS:  Negative except above.   PHYSICAL EXAMINATION:  GENERAL:  No acute distress.  VITAL SIGNS:  Essentially normal.  HEENT:  Sclerae anicteric.  NECK:  Supple without obvious adenopathy.  LUNGS:  Clear.  HEART:  Regular rate and rhythm.  ABDOMEN:  Soft.  She does have some right upper quadrant with good bowel  sounds.  Mild guarding with no rebound.   Labs today  pertinent for a normal CBC with decreasing liver tests.  Slightly  decreased potassium.  Normal coags.  Normal amylase.  She also had normal  amylase and lipase x3 in the ER.   Status post ERCP, please see x-rays and dictation for details.   ASSESSMENT:  1.  Abdominal pain, nausea and vomiting, increased liver tests,      episodically.  2.  Multiple sclerosis.  3.  History of cholecystectomy with previous sphincterotomy.  4.  Migraines.  5.  Status post repeat endoscopic retrograde cholangiopancreatography and      sphincterotomy with minimal debris removed.   PLAN:  Observe for signs of delayed complications.  If none, can go home and  follow up with Dr. Randa Evens in one week to recheck liver tests and make sure  no further workup plans are needed.           ______________________________  Petra Kuba, M.D.     MEM/MEDQ  D:  07/04/2005  T:  07/04/2005  Job:  [id number]   cc:   Fayrene Fearing L. Malon Kindle.,  M.D.  [address]  [place]  [place] [address]  Fax: [phone]

## 2011-03-24 NOTE — Consult Note (Signed)
[name], [name]                  ACCOUNT NO.:  [id number]   MEDICAL RECORD NO.:  [id number]          PATIENT TYPE:  INP   LOCATION:  3030                         FACILITY:  [facility]   PHYSICIAN:  Bernette Redbird, M.D.   DATE OF BIRTH:  July 03, 1966   DATE OF CONSULTATION:  03/06/2006  DATE OF DISCHARGE:                                   CONSULTATION   Dr. Concepcion Elk asked [place] to see this 45 year old female because of severe  diarrhea.   [name], previously seen by Dr. Wandra Mannan, is now followed by Dr.  Carman Ching in our office.   She was admitted yesterday because of a roughly 1-week history of severe  diarrhea up to 20 bowel movements a day, watery in character, nonbloody,  with associated severe low abdominal crampy pain which felt like razor  blades when she had peristalsis.  She has been essentially unable to eat  for the past week but has been taking fluids without any obvious  exacerbation of symptoms.  However, there has been some nausea and vomiting,  typically a couple of times a day.   Interestingly, the patient has had several episodes of this type of problem  since December 2006 when she was treated at [facility] for what was  thought to be diverticulitis.  Each time she has been treated with  antibiotics and has gotten better with the symptoms.  Two months ago, she  was found to be C difficile positive and apparently responded to  metronidazole therapy in that a subsequent toxin assay came back negative.  However, when the symptoms started last week and Flagyl was phoned in for  her, her symptoms did not improve, prompting admission by Dr. Concepcion Elk to the  hospital yesterday.   Since being in the hospital and being treated with IV Cipro, the patient's  diarrhea is about 50% better, the cramps are also somewhat better, and her  emesis has essentially resolved.   A CT scan on admission was stable, although there is a question of some mild  thickening of the  proximal colon versus under-distension.   The patient apparently had colonoscopy several years ago in another city,  apparently negative, although we have no records of that.   The patient's presentation is made more complex by virtue of the fact that  she has underlying conditions including multiple sclerosis with severe  chronic lower extremity pain for which she is followed in the pain clinic  and treated with chronic narcotics; also, a history of migraines, depression  and polycystic ovarian syndrome.   She also has a history of cholecystectomy with choledocholithiasis leading  to recurrent pancreatitis and numerous ERCPs, one of which, roughly 8 months  ago, was complicated by retroperitoneal perforation.   PAST MEDICAL HISTORY:  See above discussion.   OUTPATIENT MEDICATIONS:  Include Topamax, Copaxone injections, Zoloft,  Mirapex, Prevacid, and oxymorphone.  There are no known drug allergies.   HABITS:  Smokes about one pack per day, nondrinker.   FAMILY HISTORY:  Alcohol abuse in her father, thyroid disease and rectal  cancer  in her mother.   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   REVIEW OF SYSTEMS:  See HPI.   PHYSICAL EXAMINATION:  GENERAL:  The patient was tearful when we came in  because of her current problems.  She is anicteric without pallor and really  not in acute distress apart from being tearful.  CHEST AND HEART:  Unremarkable.  ABDOMEN:  Has active bowel sounds, is nondistended, no bruits, and rather  diffuse guarding and tenderness, not highly impressive; certainly no  peritoneal findings or rebound.  RECTAL:  Exam by the medical resident showed stool that was without any  frank blood.  Hemoccult assay on that was sent to the lab and is pending at  the present time.   LABORATORIES:  Pertinent for the fact that the patient's potassium was 2.6  when she was admitted  yesterday.  Hemoglobin 13.3 on admission, not yet  repeated.  This was down slightly from 14.6 about 5 days earlier.  White  count on admission was 6900 and was basically stable from several days  earlier.  Platelets 273,000.  Differential is somewhat atypical in that the  patient had a 100% lymphocytes with atypical lymphocytes noted 5 days ago as  an outpatient, and as of yesterday still had 51% lymphocytes with 42 polys.  Admission potassium yesterday was 3.2, although a couple of days earlier,  apparently in the emergency room, it was 2.6.  BUN was 4 on admission with  creatinine of 0.7.  Liver chemistries are within normal limits.  Serum  albumin following hydration is 3.0.   IMPRESSION:  1.  Recurring diarrheal illness over the past 6 months, seemingly responsive      to antibiotic therapy.  2.  Clostridium difficile infection approximately 2 months ago with      subsequent negative toxin assay following treatment.  3.  History of pancreatitis secondary to choledocholithiasis, status post      multiple endoscopic retrograde cholangiopancreatographies.  4.  Complex medical background including multiple sclerosis, chronic pain,      chronic narcotics and depression.  5.  Atypical lymphocytosis, of uncertain relevance to current clinical      presentation.   DISCUSSION:  I think the differential diagnosis would include small bowel  bacterial overgrowth for which the patient is at risk by virtue of her  chronic narcotic therapy, and which would be consistent with her response to  antibiotics.  Microscopic colitis and celiac disease would be other  possibilities, although the response to antibiotics would be atypical.   RECOMMENDATIONS:  1.  Continue Cipro for a short course (perhaps 2-3 more days) since the      patient seems to be improving on it, as is typical for her over the past     6 months.  Note that she is about 50% improved over the past 24 hours.  2.  Try hyoscyamine  (NuLev) as an antispasmodic agent for cramps.  3.  Check for celiac disease with a tissue transglutaminase level.  4.  Consider colonoscopy as an inpatient if her symptoms do not improve;      otherwise, I do think she would benefit from it as an outpatient,      particularly at the time of a subsequent recurrence.  She should have      random mucosal biopsies to rule out microscopic colitis at the time of  such colonoscopic evaluation.  5.  I realistically do not think this patient has diverticulitis based on      the CT findings and the severity of the diarrhea, which would be      atypical with that condition.  As further confirmation of this, the next      episode might be treated with a trial of rifaximin rather than Cipro.      Rifaximin is nonsystemic and therefore would not treat diverticulitis,      whereas it is effective treatment for both bacterial overgrowth and C.      difficile infection, so a      prompt response to it would imply the presence of one or the other of      those conditions, with the C. difficile been further diagnosable by      sigmoidoscopic or colonoscopic evaluation.  6.  Repeat CBC and differential count to see if the atypical leukocytosis is      improving.           ______________________________  Bernette Redbird, M.D.     RB/MEDQ  D:  03/06/2006  T:  03/07/2006  Job:  [id number]   cc:   Fayrene Fearing L. Malon Kindle., M.D.  Fax: [phone]

## 2011-03-24 NOTE — Consult Note (Signed)
[name], [name]                  ACCOUNT NO.:  [id number]   MEDICAL RECORD NO.:  [id number]          PATIENT TYPE:  INP   LOCATION:  6741                         FACILITY:  [facility]   PHYSICIAN:  Bevelyn Buckles. Cham[place], M.D.DATE OF BIRTH:  13-Nov-1965   DATE OF CONSULTATION:  07/19/2005  DATE OF DISCHARGE:                                   CONSULTATION   CHIEF COMPLAINT:  Psychosis.   HISTORY OF PRESENT ILLNESS:  [name] is 45 year old Caucasian female  with past medical history of MS and migraines who initially presented with  increased abdominal pain and found to have a duodenal perforation.  The  patient states she has been in a tremendous amount of pain grading it 7 out  of 10 and states that Dilaudid helps.  Last evening, the patient had an  episode of not knowing where she was, thinking she was at the beach, and  being very combative.  She was given IV Haldol and has been sleeping most of  the morning.  The patient is back to baseline, she has not had any further  episodes.  The patient denies remembering the event.  The patient's baseline  MS consists of numbness and unsteadiness.  The patient thought she was  dreaming during this time.  The patient is going to be followed by Dr.  Lin [name] for her MS and has been on Copaxone for the past two years without  any problems.  She denies any new weakness, vision changes, speech changes,  dizziness, vertigo, or loss of consciousness.   PAST MEDICAL HISTORY:  Positive for MS, chronic pancreatitis, and migraine  headaches.   MEDICATIONS:  Lexapro, Verapamil, Protonix, Phenergan, Ambien, Haldol  p.r.n., and Copaxone.   ALLERGIES:  The patient has drug allergies to erythromycin which causes  nausea and vomiting.   FAMILY HISTORY:  Noncontributory.   SOCIAL HISTORY:  The patient currently lives with her kids and mother.  She  denies any smoking, alcohol, or drug use.   REVIEW OF SYSTEMS:  Positive for an episode of  agitation/combativeness and  disorientation, muscle and joint pain, unsteadiness, tingling, and abdominal  pain.  Review negative other than as HPI and also negative for recent  numbness, fever, chest pain, shortness of breath, or depression.   PHYSICAL EXAMINATION:  VITAL SIGNS:  Temperature 98.5, pulse 91, blood pressure 110/59,  respirations 18, oxygen saturation 97% on room air.  HEENT:  Normocephalic, atraumatic, extraocular movements intact, pupils  equal, round, reactive to light.  NECK:  Supple, no carotid bruits.  HEART:  Regular.  LUNGS:  Clear.  ABDOMEN:  Soft, nontender.  EXTREMITIES:  No edema, good pulses.  NEUROLOGICAL:  The patient is alert and oriented x 3.  Language, memory, and  knowledge within normal limits.  Cranial nerves 2-12 are grossly intact.  Motor examination shows 5/5 strength throughout except for right upper  extremity where the patient has slight weakness at 4+/5 strength.  The  patient has a questionable slight drift, as well, which is not consistent on  multiple testing.  The patient has normal to  slowly increased tone  throughout.  Sensory examination is within normal limits to light touch  throughout.  Reflexes are 2+ in the upper extremities and 3+ in the lower  extremities.  Toes are neutral bilaterally.  Cerebellar functioning is  within normal limits  with finger-to-nose and rapid alternating movements.  Gait is steady.  The patient has a slight Romberg sign with swaying with her  eyes closed.   LABORATORY DATA:  WBC 7.9, hemoglobin 11.4, hematocrit 33.2, platelets 322.  Sodium 142, potassium 4, chloride 106, bicarb 30, BUN 3, creatinine 0.7,  glucose 124.  LFTs were unremarkable.  Calcium 9, magnesium 2.1, phosphorous  2.8.   IMPRESSION:  [name] is 45 year old Caucasian female with past medical  history of MS and migraines who presented with abdominal pain and last  evening had an episode of disorientation and combativeness without   recollection of event.  I would suggest obtaining an EEG at this time to  rule out seizure activity and an MRI of the brain to evaluate for any new MS  lesion or other lesions that could be contributing to her episode.  I would  also check a B12, TSH, RPR, and B1.  Other thoughts in differential include  withdrawal or intoxication or hospital induced delirium.  I agree with using  Haldol p.r.n. for psychosis.  We will follow the patient.           ______________________________  Bevelyn Buckles. Nash Shearer, M.D.     DRC/MEDQ  D:  07/19/2005  T:  07/19/2005  Job:  [id number]
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
## 2011-03-24 NOTE — Discharge Summary (Signed)
NAMEOGECHI, KUEHNEL                  ACCOUNT NO.:  [id number]   MEDICAL RECORD NO.:  [id number]          PATIENT TYPE:  INP   LOCATION:  6734                         FACILITY:  [facility]   PHYSICIAN:  [name]. [name], MD    DATE OF BIRTH:  1966-03-19   DATE OF ADMISSION:  01/09/2007  DATE OF DISCHARGE:  01/16/2007                               DISCHARGE SUMMARY   ADMISSION DIAGNOSES:  1. Acute pancreatitis.  2. Nausea and vomiting.  3. Right upper quadrant abdominal pain.   DISCHARGE DIAGNOSES:  1. Acute pancreatitis.  2. Nausea and vomiting.  3. Right upper quadrant abdominal pain.   HISTORY OF PRESENT ILLNESS:  Please see  H&P for full details.   HOSPITAL COURSE:  The patient was admitted over to the hospital for her  complaints of nausea, vomiting and abdominal pain which was suspected be  secondary to acute pancreatitis.  Over the intervening month, the  patient reports feeling an increasing dizziness and weakness and she was  felt to be dehydrated at the time of admission.  Subsequently she was  started on IV fluids at 150 cc/hour and strictly made NPO.  NPO.  Unfortunately, she had difficulty with losing IV sites and she  subsequently underwent a PICC line insertion without any further  difficulties of IV access.  She was also started on a Dilaudid PCA which  over the course of 2-3 days eventually helped to control her pain.  On  hospital day #3 she was advanced to clear liquid diet which she was able  to tolerate.  However, she did seem to exacerbate her nausea somewhat  and then she was advanced to a low-fat diet.  Unfortunately the low-fat  diet did result in a worsening of her abdominal pain after p.o. intake.  She was nauseous, but no vomiting occurred.  She did require the use of  the Dilaudid loaded PCA throughout the entire duration of the  hospitalization with acute exacerbations of her pain  She did undergo  repeat CT scan of her abdomen and pelvis and there was no  evidence of  any biliary ductal dilation or acute pancreatitis.  Interestingly  enough, prior to her admission she was found to have a lipase in the  1000 range.  The patient was treated with Zofran for the course of her  duration.  Also provided with Diflucan for a yeast infection that was  precipitated secondary to the use of ciprofloxacin for a urinary tract  infection.  This resolved without any difficulties.  The patient was  able to tolerate p.o. at time of discharge.  Unfortunately her pain had  not resolved, but there is no significant change in regards to the  improvement of her pain  in the hospitalization versus an outpatient  basis as felt that she could be discharged home with p.o. pain  medications and further evaluation workup would be done on an outpatient  basis.  On the day of discharge she was undergoing a gastric emptying  scan at the possibility for her abdominal pain and the results are  pending at this time.   DISCHARGE MEDICATIONS:  Plan is to discharge the patient home with:  1. Dilaudid 2 mg q.4-6 h p.r.n. pain.  2. Zofran 4 mg q.6 h p.r.n. nausea.   Additionally a [facility] consultation with Dr. Marilynne Drivers  will be  made for the patient as she was previously evaluated by Dr. Marilynne Drivers in  the past Shawnie Dapper thank you      [name]. [name], MD  Electronically Signed     PDH/MEDQ  D:  01/16/2007  T:  01/18/2007  Job:  [id number]

## 2011-03-24 NOTE — Procedures (Signed)
EEG NUMBER:  [id number]   COMMENT:  This is a patient admitted to the service of Dr. Williams Che  who names Dr. Deneen Harts as his consulting neurologist.  The EEG was  recommended and performed on 07/20/2005 for a lethargic 45 year old right-  handed female that complains of muscle spasms and restlessness with a  question if this could be a focal seizure.   PROTOCOL:  Activating procedures were involved in the 16 channel EEG was 1  channel representing heart rate and rhythm exclusively.  Hyperventilation  and photic stimulation were performed.  The patient is already treated on  Dilantin.   DESCRIPTION:  Ms. [name] has a posterior dominant background that emit  on both posterior hemispheres at a frequency of 9 Hz.  There is frequent  beta fast activity noticed.  Within the first 3 minutes of the study  recording, I was able to identify at least 2 bursts of central parietal  sharp waves that appear fairly symmetric and synchronous, and could  represent early stages of sleep.  There are also frequent eye movements  recorded.  The patient is described as drowsy and some of the motion  artifact were named as swallowing artifact, yawning artifact or head-moving  artifact.  Review of the study, referential and in transverse montages, was  performed and finally return to the [facility] montage was used to  identify the named sharp wave formations as not related to the vertex.  This  therefore will likely represents epileptiform discharges, but the patient is  also asleep and/or drowsy when these discharges appear.  A clinical seizure  activity was not noticed by the technician.   CONCLUSION:  Although this patient is described as having a progressed form  of mental status change, I cannot truly identify a clear seizure focus.  There seem to be bilaterally possible temporal epileptiform discharges  occurring in drowsiness and sleep, and I am hard-pressed to identify them  any  further.  I also felt that photic stimulation further accentuated these  discharges and I would therefore consider them epileptiform.  This is an  abnormal EEG abnormal EEG with a generalized encephalopathic background and  generalized epileptiform discharges, although they do not progress into  clinical seizures.           ______________________________  Melvyn Novas, M.D.     ZO:XWRU  D:  07/20/2005 18:36:35  T:  07/21/2005 08:20:35  Job #:  [id number]   cc:   Bevelyn Buckles. Nash Shearer, M.D.  Fax: [phone]

## 2011-03-24 NOTE — H&P (Signed)
[name], [name]                  ACCOUNT NO.:  [id number]   MEDICAL RECORD NO.:  [id number]          PATIENT TYPE:  INP   LOCATION:  3030                         FACILITY:  [facility]   PHYSICIAN:  Fleet Contras, M.D.    DATE OF BIRTH:  04/17/1966   DATE OF ADMISSION:  03/04/2006  DATE OF DISCHARGE:                                HISTORY & PHYSICAL   PRESENTING COMPLAINTS:  1.  Abdominal pain.  2.  Nausea and vomiting.  3.  Diarrhea.   HISTORY OF PRESENT ILLNESS:  [name] is a 45 year old Caucasian lady with  past medical history significant for chronic pancreatitis, diverticulosis of  the colon, gastroesophageal reflux disease, history of  biliary stenosis,  polycystic ovarian syndrome, and multiple sclerosis. She presented to the  emergency room of [facility] with a few days of progressively  worsening abdominal pain associated with nausea, vomiting, and diarrhea.  She states she was unable to keep any food or drinks down, but she did not  have any blood in her vomitus or in her urine.  She did not have any fevers  or chills.  The abdominal pain was mostly in the lower abdomen but sometimes  diffuse.  It is crampy in nature and nonradiating.  She denied taking any  nonsteroidal anti-inflammatory drugs, no use of alcohol or illicit drugs.  She is on oxymorphone for her chronic leg pain related to multiple  sclerosis, but she says this does not relieve her pain, and she could not  keep most of her oral medications down due to the nausea and vomiting.  She,  therefore, decided to come to the emergency room.   She was evaluated in the emergency room and noted to be in stable condition  with stable vital signs.  Her initial laboratory data did show positive pus  cells in her urine.  Her potassium was low at 2.6. She received intravenous  fluids as well as started on IV ciprofloxacin by my colleague, Dr. Bascom Levels  who was on call, and she was hospitalized for further evaluation  and  appropriate therapy.   PAST MEDICAL HISTORY:  As stated above:  1.  Chronic pancreatitis.  2.  Diverticulosis of the colon.  3.  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  4.  Multiple sclerosis.  5.  Gastroesophageal reflux disease.  6.  Polycystic ovarian syndrome.   PAST SURGICAL HISTORY:  1.  ERCP.  2.  Open cholecystectomy.  3.  Colonoscopy some years ago.  She has been seen by Dr. Carman Ching of      [facility] recently and was being evaluated for repeat colonoscopy.   CURRENT MEDICATIONS:  1.  Copaxone injection daily.  2.  Topamax 100 mg daily.  3.  Zoloft 100 mg 2 daily.  4.  Mirapex 20 mg once p.r.n. as needed.  5.  Prevacid 30 mg daily.  6.  Oxymorphone 20 mg twice daily.   ALLERGIES:  No known drug allergies.   FAMILY AND SOCIAL HISTORY:  She has 4 healthy children.  She has one brother  with migraine.  Her father has hypertension and alcohol abuse.  Her mother  has some thyroid problems. She smokes about 10 cigarettes per day and has  done for about 10 years.  She denies any use of alcohol or illicit drugs.   REVIEW OF SYSTEMS:  GENERAL:  She does have some weakness and fatigue but  denies any fevers or chills. SKIN: She has no skin rash or lumps.  GI: As  above. GU: She has no dysuria, frequency, or hematuria.  MUSCULOSKELETAL:  She does have some pain in her lower back and legs. NEUROLOGIC:  She has no  headaches, dizziness, seizures, syncope, or focal weakness.  CARDIOVASCULAR:  She has history of chest pain, shortness of breath, orthopnea, or PND.   PHYSICAL EXAMINATION:  GENERAL:  She is lying comfortably in bed, not in  acute respiratory or painful distress.  She is not pale.  She is not  icteric. She is mildly dehydrated.  VITAL SIGNS:  Her initial vital signs show blood pressure 118/76, heart rate  66 and regular, respiratory rate 18, temperature 98.3, O2 saturation on room  air 98%.  NECK:   Supple with no elevated JVD, no cervical lymphadenopathy, no carotid  bruit.  CHEST:  Good air entry bilaterally with no rales, rhonchi, or wheezes.  ABDOMEN:  Full, soft.  There is tenderness diffusely without guarding or  rebound tenderness.  Bowel sounds are present.  EXTREMITIES:  No edema, no calf tenderness or swelling.  CNS: Alert and oriented with no focal neurological deficits.   LABORATORY DATA:  White count 8.3, hemoglobin 13.6, hematocrit 40, platelet  count 357.  Sodium 142, potassium 2.6, chloride 109, bicarbonate 24, BUN 4,  creatinine 0.7, glucose 112, AST 27, ALT 22, alkaline phosphatase 119, total  bilirubin 0.5, lipase 23, albumin 3.7.  Urinalysis is positive for 3 to 6  wbc's and trace leukocytes.  Pregnancy test negative.   ASSESSMENT:  [name] is a 45 year old Caucasian lady with past medical  history significant for chronic pancreatitis and diverticular disease.  She  presents with acute onset of abdominal pain progressing, associated with  nausea, vomiting, and diarrhea.  She has been admitted to the hospital for  mild dehydration and possibility of diverticulitis.   ADMISSION DIAGNOSES:  1.  Diverticulitis.  2.  Urinary tract infection.  3.  Dehydration.  4.  Hypokalemia.   PLAN OF CARE:  1.  She will be admitted to a medical bed.  2.  She will be started on IV fluids with D5 half normal saline at 75 mL an      hour with potassium replacement.  3.  IV ciprofloxacin 400 mg twice daily.  4.  CT scan of her abdomen and pelvis will be performed.  5.  She will be on clear liquid diet for now, and this will be advanced as      tolerated.   This Plan of Care has been discussed with her, and her questions have been  answered.  Gastroenterology consult will be requested if her condition does  not improve or CT scan shows some significant findings. Urine cultures are  still pending.      Fleet Contras, M.D. Electronically Signed     EA/MEDQ  D:   03/05/2006  T:  03/05/2006  Job:  [id number]

## 2011-03-24 NOTE — H&P (Signed)
[name], [name]                  ACCOUNT NO.:  [id number]   MEDICAL RECORD NO.:  [id number]          PATIENT TYPE:  INP   LOCATION:  6734                         FACILITY:  [facility]   PHYSICIAN:  [name]. [name], MD    DATE OF BIRTH:  28-Jan-1966   DATE OF ADMISSION:  01/09/2007  DATE OF DISCHARGE:                              HISTORY & PHYSICAL   REASON FOR ADMISSION:  Abdominal pain and history of pancreatitis.   REFERRING PHYSICIAN:  __________   HISTORY OF PRESENT ILLNESS:  This is a 45 year old female with a past  medical history of pancreatitis presumably secondary to gallstones,  Topamax, gastroesophageal reflux disease, and of multiple sclerosis, who  was admitted to the hospital for further evaluation of her pancreatitis.  The patient has a long and complicated history.  Apparently she had had  progressive abdominal pain for the past month.  Previous to this time  she denied having any pains but she does have a recurrent history of  pancreatitis in the past.  Initially she was seen several years pack by  her gastroenterologist in her hometown.  Unfortunately, he was not able  to perform an ERCP as there were some there were some technical  difficulties.  She was subsequently referred to Dr. [name], who was at  [facility] at the time, and she underwent an ERCP at that time.  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  Subsequently a biliary sphincterotomy was performed at that time and  additionally, she has had 2-3 other ERCPs for similar types of  complaints of her pancreatitis.  While in [place] she was previously  evaluated by Dr. [name] for her abdominal pain.  He had performed an ERCP  in the past and I am uncertain about the findings at this time.  The  patient was scheduled to see one of the [facility] physicians.  Unfortunately, because of certain circumstances she was not able to make  the appointment.  Subsequently, it is difficult for  her to obtain an  additional follow-up appointment.  Her abdominal pain continued to  increase and she did present to the [facility] emergency room for  further evaluation at that time.  There are no significant abnormalities  in regards to her biliary tract, and her transaminases as well as her  lipase were noted to be normal in early February.  Subsequently she  presented to Urgent Care at [facility] for further evaluation of her  abdominal pain as she felt it had increased.  A repeat evaluation of her  lipase was noted to be at 1038.  She was subsequently referred to Dr.  [name] for further evaluation and treatment of her pancreatitis.  She  states at this time that she is feeling dizzy and lightheaded and unable  to function at this time as a result of the pain.  Any types of p.o.  intake will worsen her abdominal pain.  She feels dehydrated and has a  significant amount of vomiting as a result and has a significant amount  of nausea as a result.   PAST MEDICAL HISTORY PAST SURGICAL HISTORY:  As stated above.   FAMILY HISTORY:  Noncontributory.   SOCIAL HISTORY:  The patient is single, has 4 children.  She is  disabled.  Positive history of smoking half-pack per day for the past 7  years.  No alcohol or illicit drug use.   ALLERGIES:  ERYTHROMYCIN, and she has intolerance to this medication.   MEDICATIONS:  Topamax, Prevacid, Zoloft, oxycodone, Xanax, Viokase and  Relpax.  Heart.   REVIEW OF SYSTEMS:  Significant for a mild amount of weight loss.  Positive for nausea, vomiting, dizziness.  No shortness of breath or  chest pain.  No dysuria or dysarthria.  No diarrhea or constipation.  No  arthritis, arthralgias, no myalgias.  No new neurologic changes.  No  skin rashes.   PHYSICAL EXAMINATION:  VITAL SIGNS:  Blood pressure is 130/80, heart  rate is 64, weight is 117.  Temperature is 97.8.  GENERAL:  She is in no acute distress.  However, she appears to be weak.  HEENT:  Normocephalic, atraumatic.  Extraocular muscles intact.  NECK:  Supple.  No lymphadenopathy.  LUNGS:  Clear to auscultation bilaterally.  CARDIOVASCULAR:  Regular rate and rhythm.  ABDOMEN:  Flat, soft, is tender in the epigastric region, no rebound or  rigidity.  There is some right upper quadrant tenderness.  EXTREMITIES:  No clubbing, cyanosis or edema.   LABORATORY VALUES:  Pending at this time.   IMPRESSION:  1. Acute pancreatitis.  2. Nausea and vomiting,  3. Weight loss.  4. Multiple sclerosis.   After evaluation OF the patient in the office, IT WAS  felt that she  required A hospital admission for further evaluation and treatment of  her pancreatitis.  She is not able to tolerate p.o. well at this time  and there is no evidence of any orthostasis, but clinically it appears  that she require further hospitalization.  I do not believe she requires  an ERCP at this time, and further evaluation of her prior records will  be very beneficial in regards to guide further management of her current  therapy.   PLAN:  1. Pain control.  2. IV hydration.  3. Strict n.p.o. status.  4. Repeat laboratory values.  5. Perform a CT scan of the abdomen and pelvis to evaluate for the      pancreas and to rule out any a development of a pseudocyst.      [name]. [name], MD  Electronically Signed     PDH/MEDQ  D:  01/09/2007  T:  01/10/2007  Job:  [id number]

## 2011-03-24 NOTE — H&P (Signed)
NAMEELLIOTT, QUADE                  ACCOUNT NO.:  [id number]   MEDICAL RECORD NO.:  [id number]          PATIENT TYPE:  INP   LOCATION:  1829                         FACILITY:  [facility]   PHYSICIAN:  John C. Madilyn Fireman, M.D.    DATE OF BIRTH:  12/10/1965   DATE OF ADMISSION:  07/09/2005  DATE OF DISCHARGE:                                HISTORY & PHYSICAL   CHIEF COMPLAINT:  Abdominal pain, nausea, and vomiting.   HISTORY OF ILLNESS:  The patient is a 45 year old white female who presents  with continued abdominal pain with nausea and vomiting 4 days after an ERCP  with sphincterotomy.  She had presented with abdominal pain, nausea, and  vomiting at that time with elevated liver function tests, which had fallen  between 825 and 829 on the day of her ERCP.  This showed evidence of  previous sphincterotomy, and otherwise normal common bile duct with  extension of the sphincterotomy and possible minimal debris seemed to be  removed with a balloon pull through.  She has had improvement in nausea, but  persistence of pain with more soreness and a most postprandial component to  the pain she had prior to the procedure.  She has not had any fever, and has  been having normal bowel movements.  She did experience a chill last night.  She came to the emergency room, and she was found to have retroperitoneal  air between her right kidney and her psoas muscle.  Her WBC count was  10,100, bilirubin normal, alkaline phosphatase 124, SGOT 21, SGPT 103, all  improved from August 29, 296.  Her amylase and lipase were normal.  She is  admitted for suspected confined perforation, presumably related to recent  ERCP.   PAST MEDICAL HISTORY:  1.  Multiple sclerosis.  2.  Gastroesophageal reflux.  3.  History of migraine headaches.  4.  History of cholecystitis and biliary pancreatitis.  She had a      cholecystectomy at [facility] 3 years ago, and apparently her course was      complicated by pancreatitis, and she  reported approximately 3 ERCPs,      although I do not have detailed records.   SURGERIES:  1.  Cholecystectomy.  2.  Inguinal hernia repair.   ALLERGIES:  ERYTHROMYCIN.   MEDICATIONS:  1.  AcipHex 20 mg daily.  2.  Lexapro 40 mg daily.  3.  Verapamil SR 240 mg daily.  4.  Copaxone 20 mg subcutaneous daily.  5.  Zanaflex p.r.n. for leg cramps.  6.  Dilaudid p.r.n. for pain.   SOCIAL HISTORY:  The patient is a Engineer, civil (consulting).  She is engaged to be married.  She  smokes a half pack of cigarettes a day.  Denies alcohol use.   FAMILY HISTORY:  Noncontributory.   PHYSICAL EXAMINATION:  GENERAL:  Well-developed, well-nourished, white  female in no acute distress.  VITAL SIGNS:  Temperature 98.9, pulse 78, blood pressure 132/80,  respirations 16.  HEENT:  Unremarkable.  No scleral icterus.  LUNGS:  Clear.  HEART:  Regular rate and rhythm  without murmur.  ABDOMEN:  Soft, slightly distended with normoactive bowel sounds.  There is  diffuse epigastric and bilateral upper quadrant pain to deep palpation with  no rebound.   LABORATORY DATA:  Hemoglobin 13.1, WBC 10,100, platelets 332,000.  Liver  function tests, amylase and lipase as mentioned above.   IMPRESSION:  Abdominal pain with free retroperitoneal air after an ERCP.  Suspect confined leakage of air without frank perforation or peritonitis.   PLAN:  Will start on IV antibiotics, and will obtain abdominal CT scan for  better assessment of location of free air and to rule out any abscess or  fluid collection.           ______________________________  Everardo All Madilyn Fireman, M.D.     J[facility]/MEDQ  D:  07/09/2005  T:  07/10/2005  Job:  [id number]   cc:   Petra Kuba, M.D.  [address]  [place]  [place] [address]  Fax: [phone]

## 2011-03-24 NOTE — H&P (Signed)
NAMEELLISE, KOVACK                  ACCOUNT NO.:  [id number]   MEDICAL RECORD NO.:  [id number]          PATIENT TYPE:  INP   LOCATION:  6741                         FACILITY:  [facility]   PHYSICIAN:  James L. Malon Kindle., M.D.DATE OF BIRTH:  [birth year]   DATE OF ADMISSION:  07/10/2005  DATE OF DISCHARGE:                                HISTORY & PHYSICAL   REASON FOR ADMISSION:  Abdominal pain with possible duodenal perforation.   HISTORY:  A 45 year old white female who has had a previous history of  cholecystectomy done at [facility] a number of years ago, approximately 3 years  ago, and apparently she had pancreatitis and probably a common duct stone at  that time.  She apparently underwent an ERCP and sphincterotomy at [facility] 3  years ago.  She has been lost to follow up, but she came back to [facility]  with nausea, vomiting, right upper quadrant pain, elevated liver tests, came  to the emergency room, and was felt to possibly have a retained common duct  stone.  She underwent an ERCP 1 week ago today, on July 04, 2005, by Dr.  [name].  According to the operative note, she did have evidence of a previous  sphincterotomy, and the site appeared to be scarred down.  Dr. [name]  received deep cannulation of the common bile duct.  It extended to her  sphincterotomy and did a pull-through with an 8.5 mm balloon with no clear  stone material and no resistance.  Occlusion cholangiogram was negative for  stones or any kind of leakage.  The patient went home and apparently did  well for a few days, but developed nausea, vomiting, abdominal pain, and had  to come back to the hospital on July 09, 2005, just 2 days ago.  She got  a chill, and came back to the emergency room, and was found to have  retroperitoneal air around her right kidney and right duodenum.  This was  done on the CT scan.  She was admitted to the hospital on July 09, 2005,  sent home yesterday.  She received IV Unasyn and was  sent home on oral  antibiotics.  She had a laboratory appointment to come back today to repeat  her laboratory tests, but woke up during the night with worsening pain and  nausea.  Every time she eats, she has severe upper abdominal pain and  nausea, and it was felt that she needed to come back to the hospital, in  view of her known leak of air through her duodenum following her ERCP.   MEDICATIONS ON ADMISSION:  1.  AcipHex 20 mg daily.  2.  Lexapro 40 mg daily.  3.  Verapamil XR 240 mg daily.  4.  Copaxone 20 mg subcutaneous daily.  5.  Amoxicillin 875/125 p.o. daily.  6.  Zanaflex 4 mg q.6h. p.r.n.  7.  Compazine p.r.n.  8.  Relpax one tablet p.r.n. for headache.  9.  Lidocaine patches 5% q.12h. p.r.n. for problems with MS.   ALLERGIES:  ERYTHROMYCIN.   MEDICAL HISTORY:  1.  MS followed by a number of people with multiple symptoms.  She does have      a neurogenic bladder apparently associated with this.  2.  Chronic esophageal reflux.  3.  Migraine headaches.   SURGICAL HISTORY:  1.  Cholecystectomy at [facility] 3 years ago.  2.  Inguinal hernia repair.   FAMILY HISTORY:  Noncontributory.   SOCIAL HISTORY:  She is a Engineer, civil (consulting).  She is engaged to be married.  She does  not drink.  She smokes a half a pack a day.   REVIEW OF SYSTEMS:  Primarily as mentioned in the present illness.  She is  passing urine and having bowel movements, in spite of her abdominal  distention.  Postprandial nausea.   PHYSICAL EXAMINATION:  VITAL SIGNS:  Still pending.  GENERAL:  A pleasant young female who is walking around the room and does  not appear to be in any obvious distress.  HEENT:  Eyes - sclerae are nonicteric.  Extraocular movements intact.  Throat is normal.  There is no evidence of oral thrush.  NECK:  Supple.  LUNGS:  Clear anteriorly and posteriorly.  HEART:  Regular rate and rhythm without murmurs or gallops.  ABDOMEN:  Distended with mild epigastric tenderness, but bowel sounds  are  present.   LABORATORY DATA:  All pending currently.   ASSESSMENT:  Recurrent abdominal pain, status post ERCP a week ago with  apparent breach in the duodenal wall and passage of large quantities of  retroperitoneal air.  The fortunate thing is that careful review of the CT  today with the radiologist did not show any obvious leak of oral contrast,  which was given with the CT yesterday.  I am quite hesitant to place an  endoscope down and look and insufflate the upper GI tract with air.  I think  that may worsen any ongoing leakage.   PLAN:  Will go ahead and place her back on IV antibiotics and pain  medicines.  Will ask Dr. Carolynne Edouard to see her consultation.  Hopefully,  supportive therapy for the next several days will avoid the need for  surgery.           ______________________________  Llana Aliment Malon Kindle., M.D.     Waldron Session  D:  07/11/2005  T:  07/11/2005  Job:  [id number]   cc:   Lucky Cowboy, M.D.  [address], [address]  [place], [place] [address]  Fax: [phone]   P.J. Carolynne Edouard, M.D.

## 2011-03-24 NOTE — Procedures (Signed)
EEG NUMBER:  [id number].   REFERRING PHYSICIAN:  Bevelyn Buckles. Nash Shearer, M.D.   CLINICAL HISTORY:  A 45 year old lady with combativeness and agitation. EEG  is being done to evaluate for seizure activity.   MEDICATIONS:  Not listed.   This is a 17-channel EEG performed with the patient in the wakeful state  using standard 10/20 electrode placement.   Background awake rhythm consists of 9 to 10 hertz alpha which is of moderate  amplitude, synchronous and reactive to eye opening and closure. Muscle  artifact is noted throughout most of the tracing which compromises the  quality. No paroxysmal epileptiform activities, spikes, or sharp waves are  seen. Sleep stages are not seen except for drowsiness which shows mild theta  slowing in the central and parietal regions. Length of the tracing is 27.2  minutes. Technical component is average. EKG tracing reveals regular sinus  rhythm. Hyperventilation and photic stimulation result in no significant  abnormalities.   IMPRESSION:  This EEG performed in the wakeful states is within normal  limits. No definite epileptiform activities are identified.           ______________________________  Sunny Schlein. Pearlean Brownie, MD     EAV:WUJW  D:  07/21/2005 15:18:03  T:  07/21/2005 15:50:35  Job #:  [id number]

## 2011-03-24 NOTE — Op Note (Signed)
[name], [name]                  ACCOUNT NO.:  [id number]   MEDICAL RECORD NO.:  [id number]          PATIENT TYPE:  AMB   LOCATION:  ENDO                         FACILITY:  [facility]   PHYSICIAN:  Petra Kuba, M.D.    DATE OF BIRTH:  1966-05-16   DATE OF PROCEDURE:  07/04/2005  DATE OF DISCHARGE:                                 OPERATIVE REPORT   PROCEDURE:  ERCP sphincterotomy, balloon pull-through.   INDICATIONS:  Patient with probable recurrent CBD stones with nausea,  vomiting, right upper quadrant abdominal pain, increasing liver tests on two  occasions.  Consent was signed after risks, benefits, methods, options  thoroughly discussed with both the patient and her mother.   MEDICINES USED:  1.  Demerol 80.  2.  Versed 7.  3.  Glucagon 0.5.   PROCEDURE:  The side-viewing therapeutic video duodenoscope was inserted by  indirect vision into the stomach, easily advanced into the duodenum.  Periampullary region was brought into view.  She had a normal ampulla,  seemed to have a scarred sphincterotomy site.  Bile seemed to be draining  occasionally from just above the papilla.  We had difficulty cannulating  this area, probably based on position and based on movement.  She had  significant respiratory variation.  We did try the native ostia which one  brief injection the brief PD was injected, and we stopped as soon as we saw  a whiff of PD dye.  We then rolled her back on her left side, and we were  much more easily able to advance through the previous sphincterotomy site.  The Jag wire and deep selective cannulation was obtained.  On the initial  cannulation, we were not sure if  we saw stones versus air bubbles.  We went  ahead and proceeded with increasing her sphincterotomy site until we were  able to get the fully bowed sphincterotome easily in and out of the duct,  and there was excellent biliary drainage.  We went ahead and exchanged the  sphincterotome for the 8.5 mm  balloon and proceeded with two balloon pull-  throughs.  There was no resistance on this sphincterotomy site with pulling  the balloon through.  We then proceeded with an occlusion cholangiogram  which did not reveal any residual abnormalities.  On pulling the two  balloons through, a few flecks of debris were seen but no frank stones.  After the negative occlusion cholangiogram and pulling the balloon through  without difficulty or resistance, we elected to stop the procedure at that  junction.  There was excellent biliary drainage.  The scope was slowly  withdrawn.  Air and bile and contrast was suctioned, scope removed.  The  patient tolerated the procedure well.  There was no obvious immediate  complication.   ENDOSCOPIC DIAGNOSES:  1.  Normal ampulla with a probable scarred previous sphincterotomy site.  2.  One minimal pancreatic duct injection through her native ostia.  3.  Common bile duct with questionable stones versus air on initial      injection, status post medium size  sphincterotomy.  4.  Minimal debris removed on three balloon pull-throughs.  5.  Negative occlusion cholangiogram, normal bifurcation of her      intrahepatics, and adequate biliary drainage at the end of the      procedure.   PLAN:  We will observe for delayed complications.  There are no __________  currently, so she might to be watched overnight in the emergency room but if  doing well, will have offer of going home and then follow up next week with  Dr. Randa Evens to repeat liver test, symptoms, and make sure no further workup  plans are needed.           ______________________________  Petra Kuba, M.D.     MEM/MEDQ  D:  07/04/2005  T:  07/04/2005  Job:  [id number]   cc:   Fayrene Fearing L. Malon Kindle., M.D.  [address]  [place]  [place] [address]  Fax: [phone]

## 2011-03-28 ENCOUNTER — Emergency Department (HOSPITAL_COMMUNITY)
Admission: EM | Admit: 2011-03-28 | Discharge: 2011-03-29 | Disposition: A | Discharge status: Home / Self Care | Payer: Medicare Other | Attending: Emergency Medicine | Admitting: Emergency Medicine

## 2011-03-28 DIAGNOSIS — M129 Arthropathy, unspecified: Secondary | ICD-10-CM | POA: Insufficient documentation

## 2011-03-28 DIAGNOSIS — R51 Headache: Secondary | ICD-10-CM | POA: Insufficient documentation

## 2011-03-28 DIAGNOSIS — G35 Multiple sclerosis: Secondary | ICD-10-CM | POA: Insufficient documentation

## 2011-03-28 DIAGNOSIS — R110 Nausea: Secondary | ICD-10-CM | POA: Insufficient documentation

## 2011-03-28 DIAGNOSIS — F341 Dysthymic disorder: Secondary | ICD-10-CM | POA: Insufficient documentation

## 2011-03-28 DIAGNOSIS — Z79899 Other long term (current) drug therapy: Secondary | ICD-10-CM | POA: Insufficient documentation

## 2011-03-28 DIAGNOSIS — K589 Irritable bowel syndrome without diarrhea: Secondary | ICD-10-CM | POA: Insufficient documentation

## 2011-03-28 DIAGNOSIS — G8929 Other chronic pain: Secondary | ICD-10-CM | POA: Insufficient documentation

## 2011-03-28 DIAGNOSIS — R109 Unspecified abdominal pain: Secondary | ICD-10-CM | POA: Insufficient documentation

## 2011-03-28 LAB — CBC
HCT: 40.5 % (ref 36.0–46.0)
MCH: 31.2 pg (ref 26.0–34.0)
MCV: 90.8 fL (ref 78.0–100.0)
RDW: 12.5 % (ref 11.5–15.5)
WBC: 6.8 10*3/uL (ref 4.0–10.5)

## 2011-03-28 LAB — BASIC METABOLIC PANEL
BUN: 3 mg/dL — ABNORMAL LOW (ref 6–23)
Creatinine, Ser: 0.54 mg/dL (ref 0.4–1.2)
GFR calc non Af Amer: >60 mL/min (ref >60)
Glucose, Bld: 82 mg/dL (ref 70–99)
Potassium: 4.1 mEq/L (ref 3.5–5.1)

## 2011-03-28 LAB — DIFFERENTIAL
Eosinophils Absolute: 0.6 10*3/uL (ref 0.0–0.7)
Eosinophils Relative: 9 % — ABNORMAL HIGH (ref 0–5)
Lymphocytes Relative: 43 % (ref 12–46)
Lymphs Abs: 3 10*3/uL (ref 0.7–4.0)
Monocytes Absolute: 0.6 10*3/uL (ref 0.1–1.0)
Monocytes Relative: 9 % (ref 3–12)

## 2011-03-29 LAB — URINALYSIS, ROUTINE W REFLEX MICROSCOPIC
Glucose, UA: NEGATIVE mg/dL
Leukocytes, UA: NEGATIVE
Nitrite: NEGATIVE
Protein, ur: NEGATIVE mg/dL
pH: 7.5 (ref 5.0–8.0)

## 2011-03-29 LAB — URINE MICROSCOPIC-ADD ON

## 2011-04-26 ENCOUNTER — Other Ambulatory Visit: Payer: Self-pay | Admitting: Obstetrics

## 2011-04-26 DIAGNOSIS — R52 Pain, unspecified: Secondary | ICD-10-CM

## 2011-04-27 ENCOUNTER — Other Ambulatory Visit: Payer: Self-pay | Admitting: Obstetrics

## 2011-04-27 ENCOUNTER — Ambulatory Visit (HOSPITAL_COMMUNITY)
Admission: RE | Admit: 2011-04-27 | Discharge: 2011-04-27 | Disposition: A | Discharge status: Home / Self Care | Payer: Medicare Other | Source: Ambulatory Visit | Attending: Obstetrics | Admitting: Obstetrics

## 2011-04-27 ENCOUNTER — Inpatient Hospital Stay (HOSPITAL_COMMUNITY)
Admission: RE | Admit: 2011-04-27 | Discharge: 2011-04-27 | Payer: Medicare Other | Source: Ambulatory Visit | Attending: Obstetrics | Admitting: Obstetrics

## 2011-04-27 DIAGNOSIS — N949 Unspecified condition associated with female genital organs and menstrual cycle: Secondary | ICD-10-CM | POA: Insufficient documentation

## 2011-04-27 DIAGNOSIS — R52 Pain, unspecified: Secondary | ICD-10-CM

## 2011-05-08 ENCOUNTER — Emergency Department (HOSPITAL_COMMUNITY)
Admission: EM | Admit: 2011-05-08 | Discharge: 2011-05-09 | Disposition: A | Discharge status: Home / Self Care | Payer: Medicare Other | Attending: Emergency Medicine | Admitting: Emergency Medicine

## 2011-05-08 DIAGNOSIS — R51 Headache: Secondary | ICD-10-CM | POA: Insufficient documentation

## 2011-05-08 DIAGNOSIS — Z79899 Other long term (current) drug therapy: Secondary | ICD-10-CM | POA: Insufficient documentation

## 2011-05-08 DIAGNOSIS — R062 Wheezing: Secondary | ICD-10-CM | POA: Insufficient documentation

## 2011-05-08 DIAGNOSIS — F341 Dysthymic disorder: Secondary | ICD-10-CM | POA: Insufficient documentation

## 2011-05-08 DIAGNOSIS — R112 Nausea with vomiting, unspecified: Secondary | ICD-10-CM | POA: Insufficient documentation

## 2011-05-20 ENCOUNTER — Emergency Department (HOSPITAL_COMMUNITY)
Admission: EM | Admit: 2011-05-20 | Discharge: 2011-05-20 | Disposition: A | Discharge status: Home / Self Care | Payer: Medicare Other | Attending: Emergency Medicine | Admitting: Emergency Medicine

## 2011-05-20 DIAGNOSIS — F341 Dysthymic disorder: Secondary | ICD-10-CM | POA: Insufficient documentation

## 2011-05-20 DIAGNOSIS — Z79899 Other long term (current) drug therapy: Secondary | ICD-10-CM | POA: Insufficient documentation

## 2011-05-20 DIAGNOSIS — G43909 Migraine, unspecified, not intractable, without status migrainosus: Secondary | ICD-10-CM | POA: Insufficient documentation

## 2011-05-20 DIAGNOSIS — H5710 Ocular pain, unspecified eye: Secondary | ICD-10-CM | POA: Insufficient documentation

## 2011-05-20 DIAGNOSIS — R209 Unspecified disturbances of skin sensation: Secondary | ICD-10-CM | POA: Insufficient documentation

## 2011-05-20 DIAGNOSIS — H53149 Visual discomfort, unspecified: Secondary | ICD-10-CM | POA: Insufficient documentation

## 2011-06-06 ENCOUNTER — Inpatient Hospital Stay (INDEPENDENT_AMBULATORY_CARE_PROVIDER_SITE_OTHER)
Admission: RE | Admit: 2011-06-06 | Discharge: 2011-06-06 | Disposition: A | Discharge status: Home / Self Care | Payer: Medicare Other | Source: Ambulatory Visit | Attending: Family Medicine | Admitting: Family Medicine

## 2011-06-06 DIAGNOSIS — M47812 Spondylosis without myelopathy or radiculopathy, cervical region: Secondary | ICD-10-CM

## 2011-07-28 LAB — CARDIAC PANEL(CRET KIN+CKTOT+MB+TROPI)
Relative Index: INVALID
Relative Index: INVALID
Total CK: 80

## 2011-07-28 LAB — I-STAT 8, (EC8 V) (CONVERTED LAB)
Acid-Base Excess: 1
Acid-base deficit: 1
BUN: <3 — ABNORMAL LOW
Bicarbonate: 21.9
Chloride: 108
Glucose, Bld: 95
HCT: 45
HCT: 44
Hemoglobin: 15
Operator id: 196461
Operator id: 161631
Potassium: 3.7
Potassium: 4.2
Sodium: 134 — ABNORMAL LOW
TCO2: 26
TCO2: 23
pCO2, Ven: 37.1 — ABNORMAL LOW
pCO2, Ven: 30.2 — ABNORMAL LOW
pH, Ven: 7.434 — ABNORMAL HIGH
pH, Ven: 7.469 — ABNORMAL HIGH

## 2011-07-28 LAB — CBC
HCT: 44.1
MCV: 93.5
MCV: 94.1
Platelets: 248
Platelets: 287
RDW: 13.3
WBC: 9.2

## 2011-07-28 LAB — POCT I-STAT CREATININE
Creatinine, Ser: 0.7
Creatinine, Ser: 0.8
Operator id: 196461
Operator id: 161631

## 2011-07-28 LAB — POCT CARDIAC MARKERS
CKMB, poc: <1 — ABNORMAL LOW
Myoglobin, poc: 130
Operator id: 161631
Troponin i, poc: <0.05

## 2011-07-28 LAB — DIFFERENTIAL
Basophils Absolute: 0.1
Basophils Relative: 1
Eosinophils Absolute: 0.1
Eosinophils Absolute: 0.2
Eosinophils Relative: 2
Lymphocytes Relative: 43
Lymphs Abs: 4
Neutro Abs: 4.6
Neutrophils Relative %: 50

## 2011-07-28 LAB — CK TOTAL AND CKMB (NOT AT ARMC)
CK, MB: 0.8
Total CK: 63

## 2011-07-28 LAB — D-DIMER, QUANTITATIVE: D-Dimer, Quant: 0.22

## 2011-08-02 LAB — URINALYSIS, ROUTINE W REFLEX MICROSCOPIC
Leukocytes, UA: NEGATIVE
Nitrite: NEGATIVE
Protein, ur: NEGATIVE
Urobilinogen, UA: 1

## 2011-08-02 LAB — URINE MICROSCOPIC-ADD ON

## 2011-08-02 LAB — POCT PREGNANCY, URINE
Operator id: 277751
Preg Test, Ur: NEGATIVE

## 2011-08-03 LAB — URINE MICROSCOPIC-ADD ON

## 2011-08-03 LAB — DIFFERENTIAL
Eosinophils Relative: 2
Lymphocytes Relative: 36
Lymphs Abs: 5.6 — ABNORMAL HIGH
Monocytes Absolute: 0.2
Monocytes Relative: 1 — ABNORMAL LOW

## 2011-08-03 LAB — URINALYSIS, ROUTINE W REFLEX MICROSCOPIC
Glucose, UA: NEGATIVE
Specific Gravity, Urine: 1.005
Urobilinogen, UA: 1

## 2011-08-03 LAB — COMPREHENSIVE METABOLIC PANEL
Alkaline Phosphatase: 86
BUN: 4 — ABNORMAL LOW
CO2: 25
GFR calc non Af Amer: >60
Glucose, Bld: 96
Potassium: 3.2 — ABNORMAL LOW
Total Protein: 6.5

## 2011-08-03 LAB — POCT PREGNANCY, URINE: Preg Test, Ur: NEGATIVE

## 2011-08-03 LAB — LIPASE, BLOOD: Lipase: 18

## 2011-08-03 LAB — CBC
HCT: 42.4
Hemoglobin: 15
WBC: 15.5 — ABNORMAL HIGH

## 2011-08-08 LAB — URINALYSIS, ROUTINE W REFLEX MICROSCOPIC
Nitrite: NEGATIVE
Specific Gravity, Urine: 1.003 — ABNORMAL LOW
Urobilinogen, UA: 1

## 2011-08-08 LAB — DIFFERENTIAL
Basophils Absolute: 0.1
Lymphocytes Relative: 39
Monocytes Absolute: 0.4
Neutro Abs: 5.5

## 2011-08-08 LAB — CBC
HCT: 47.7 — ABNORMAL HIGH
MCV: 94.3
Platelets: 234
RDW: 13.4

## 2011-08-08 LAB — COMPREHENSIVE METABOLIC PANEL
Albumin: 4.1
BUN: 2 — ABNORMAL LOW
Chloride: 104
Creatinine, Ser: 0.55
Total Bilirubin: 0.7

## 2011-08-08 LAB — URINE MICROSCOPIC-ADD ON

## 2011-08-08 LAB — LIPASE, BLOOD: Lipase: 13

## 2011-08-15 LAB — POCT I-STAT CREATININE
Creatinine, Ser: 0.7
Operator id: 151321

## 2011-08-15 LAB — URINALYSIS, ROUTINE W REFLEX MICROSCOPIC
Glucose, UA: NEGATIVE
Leukocytes, UA: NEGATIVE
Nitrite: NEGATIVE
Specific Gravity, Urine: 1.013
pH: 6

## 2011-08-15 LAB — DIFFERENTIAL
Basophils Absolute: 0
Eosinophils Relative: 2
Lymphocytes Relative: 42
Lymphs Abs: 3.2
Monocytes Absolute: 0.4
Neutro Abs: 3.8

## 2011-08-15 LAB — I-STAT 8, (EC8 V) (CONVERTED LAB)
Acid-base deficit: 4 — ABNORMAL HIGH
BUN: 6
Chloride: 107
Glucose, Bld: 94
Potassium: 3.2 — ABNORMAL LOW
pCO2, Ven: 44.4 — ABNORMAL LOW
pH, Ven: 7.304 — ABNORMAL HIGH

## 2011-08-15 LAB — RAPID URINE DRUG SCREEN, HOSP PERFORMED: Tetrahydrocannabinol: NOT DETECTED

## 2011-08-15 LAB — HEPATIC FUNCTION PANEL
ALT: 24
AST: 22
Alkaline Phosphatase: 140 — ABNORMAL HIGH
Total Protein: 6.8

## 2011-08-15 LAB — CBC
HCT: 43.7
Hemoglobin: 14.9
Platelets: 264
WBC: 7.4

## 2011-08-15 LAB — ETHANOL: Alcohol, Ethyl (B): <5

## 2011-08-15 LAB — URINE MICROSCOPIC-ADD ON

## 2011-08-17 LAB — COMPREHENSIVE METABOLIC PANEL
AST: 16
Albumin: 3.5
Alkaline Phosphatase: 113
BUN: 2 — ABNORMAL LOW
Chloride: 105
GFR calc Af Amer: >60
Potassium: 3.2 — ABNORMAL LOW
Total Protein: 6.1

## 2011-08-17 LAB — DIFFERENTIAL
Basophils Relative: 1
Eosinophils Relative: 2
Lymphocytes Relative: 43
Monocytes Absolute: 0.4
Monocytes Relative: 5
Neutro Abs: 3.7

## 2011-08-17 LAB — CBC
HCT: 42.2
Platelets: 233
RDW: 13.2
WBC: 7.6

## 2011-08-17 LAB — URINALYSIS, ROUTINE W REFLEX MICROSCOPIC
Hgb urine dipstick: NEGATIVE
Nitrite: NEGATIVE
Protein, ur: NEGATIVE
Specific Gravity, Urine: 1.005
Urobilinogen, UA: 1

## 2011-08-17 LAB — PREGNANCY, URINE: Preg Test, Ur: NEGATIVE

## 2011-08-18 LAB — CBC
HCT: 36.1
Hemoglobin: 12.6
MCHC: 34.8
MCV: 92.4
Platelets: 218
Platelets: 231
RBC: 3.9
RDW: 14
WBC: 7.1
WBC: 8

## 2011-08-18 LAB — DIFFERENTIAL
Basophils Absolute: 0
Basophils Absolute: 0.1
Basophils Relative: 1
Eosinophils Absolute: 0.2
Eosinophils Relative: 3
Eosinophils Relative: 4
Lymphocytes Relative: 42
Lymphocytes Relative: 45
Lymphs Abs: 3.2
Monocytes Absolute: 0.4
Monocytes Absolute: 0.5
Monocytes Relative: 6
Monocytes Relative: 6
Neutro Abs: 3.2
Neutrophils Relative %: 46

## 2011-08-18 LAB — COMPREHENSIVE METABOLIC PANEL WITH GFR
ALT: 13
AST: 13
Alkaline Phosphatase: 113
CO2: 25
Calcium: 8.5
GFR calc Af Amer: >60
Glucose, Bld: 97
Potassium: 3 — ABNORMAL LOW
Sodium: 138
Total Protein: 5.6 — ABNORMAL LOW

## 2011-08-18 LAB — URINE MICROSCOPIC-ADD ON

## 2011-08-18 LAB — PREGNANCY, URINE: Preg Test, Ur: NEGATIVE

## 2011-08-18 LAB — URINALYSIS, ROUTINE W REFLEX MICROSCOPIC
Bilirubin Urine: NEGATIVE
Glucose, UA: NEGATIVE
Hgb urine dipstick: NEGATIVE
Nitrite: NEGATIVE
Specific Gravity, Urine: 1.002 — ABNORMAL LOW
Specific Gravity, Urine: 1.008
Urobilinogen, UA: 1

## 2011-08-18 LAB — COMPREHENSIVE METABOLIC PANEL
AST: 16
Albumin: 3.4 — ABNORMAL LOW
Albumin: 3.4 — ABNORMAL LOW
Alkaline Phosphatase: 110
BUN: 2 — ABNORMAL LOW
Chloride: 104
Chloride: 109
Creatinine, Ser: 0.5
GFR calc Af Amer: >60
GFR calc non Af Amer: >60
Potassium: 3.2 — ABNORMAL LOW
Total Bilirubin: 0.3
Total Bilirubin: 0.4

## 2011-08-18 LAB — LIPASE, BLOOD: Lipase: 77 — ABNORMAL HIGH

## 2011-08-24 LAB — COMPREHENSIVE METABOLIC PANEL
Albumin: 3.8
Alkaline Phosphatase: 88
BUN: 5 — ABNORMAL LOW
CO2: 27
Chloride: 104
Creatinine, Ser: 0.55
GFR calc non Af Amer: >60
Glucose, Bld: 101 — ABNORMAL HIGH
Total Bilirubin: 0.4

## 2011-08-24 LAB — CBC
HCT: 42.7
Hemoglobin: 14.5
MCV: 93.7
Platelets: 204
WBC: 7.5

## 2011-08-24 LAB — DIFFERENTIAL
Basophils Absolute: 0
Basophils Relative: 0
Lymphocytes Relative: 37
Neutro Abs: 4.2
Neutrophils Relative %: 57

## 2011-08-24 LAB — LIPASE, BLOOD: Lipase: 41

## 2011-08-24 LAB — URINALYSIS, ROUTINE W REFLEX MICROSCOPIC
Nitrite: NEGATIVE
Protein, ur: NEGATIVE
Specific Gravity, Urine: 1.027
Urobilinogen, UA: 1

## 2011-08-24 LAB — URINE MICROSCOPIC-ADD ON

## 2011-10-04 ENCOUNTER — Ambulatory Visit: Payer: Medicare Other | Admitting: Physical Therapy

## 2011-10-12 ENCOUNTER — Encounter: Payer: Self-pay | Admitting: *Deleted

## 2011-10-12 ENCOUNTER — Emergency Department (HOSPITAL_COMMUNITY)
Admission: EM | Admit: 2011-10-12 | Discharge: 2011-10-12 | Disposition: A | Discharge status: Home / Self Care | Payer: Medicare Other | Source: Home / Self Care | Attending: Family Medicine | Admitting: Family Medicine

## 2011-10-12 DIAGNOSIS — M1711 Unilateral primary osteoarthritis, right knee: Secondary | ICD-10-CM

## 2011-10-12 DIAGNOSIS — M1712 Unilateral primary osteoarthritis, left knee: Secondary | ICD-10-CM

## 2011-10-12 HISTORY — DX: Unspecified osteoarthritis, unspecified site: M19.90

## 2011-10-12 HISTORY — DX: Multiple sclerosis: G35

## 2011-10-12 HISTORY — DX: Irritable bowel syndrome, unspecified: K58.9

## 2011-10-12 MED ORDER — TRAMADOL HCL 50 MG PO TABS: 50.0000 mg | ORAL_TABLET | Freq: Four times a day (QID) | ORAL | Status: DC | PRN

## 2011-10-12 NOTE — ED Notes (Signed)
Pt  Reports  Bilateral  Knee  Pain  r  Worse  Than left   She  Reports  She  Had  A  Fall several weeks  Ago  And inj the  Knee   She  Reports  She  Was  Seen by  Ortho  For  This and    Was  Cleared  She  Reports  sev days  Ago the  Pain became  Worse  -  She  Is  Ambulatory on the  Affected  Knee

## 2011-10-12 NOTE — ED Provider Notes (Signed)
History     CSN: [id number] Arrival date & time: 10/12/2011  9:18 PM   First MD Initiated Contact with Patient 10/12/11 2007      Chief Complaint  Patient presents with  . Knee Pain    (Consider location/radiation/quality/duration/timing/severity/associated sxs/prior treatment) Patient is a 45 y.o. female presenting with knee pain. The history is provided by the patient.  Knee Pain This is a new problem. The current episode started more than 1 week ago (had steroid inj bilat by dr caffrey 2 weeks ago with appt on mon for recheck but having pain, advised to come to [facility] The problem occurs constantly. The problem has not changed since onset.The symptoms are relieved by nothing. The treatment provided mild relief.    Past Medical History  Diagnosis Date  . Multiple sclerosis   . Irritable bowel syndrome   . Arthritis     History reviewed. No pertinent past surgical history.  Family History  Problem Relation Age of Onset  . Cancer Mother   . Hypertension Father     History  Substance Use Topics  . Smoking status: Not on file  . Smokeless tobacco: Not on file  . Alcohol Use: No    OB History    Grav Para Term Preterm Abortions TAB SAB Ect Mult Living                  Review of Systems  Constitutional: Negative.   Gastrointestinal: Negative.   Musculoskeletal: Positive for joint swelling and arthralgias.  Neurological: Negative.     Allergies  Erythromycin  Home Medications   Current Outpatient Rx  Name Route Sig Dispense Refill  . BUSPIRONE HCL 10 MG PO TABS Oral Take 10 mg by mouth 3 (three) times daily.      . CYCLOBENZAPRINE HCL 10 MG PO TABS Oral Take 10 mg by mouth 3 (three) times daily as needed.      Marland Kitchen DICLOFENAC SODIUM 75 MG PO TBEC Oral Take 75 mg by mouth 2 (two) times daily.      Marland Kitchen ESCITALOPRAM OXALATE 10 MG PO TABS Oral Take 10 mg by mouth daily.      Marland Kitchen ESCITALOPRAM OXALATE 20 MG PO TABS Oral Take 20 mg by mouth daily.      . TRAMADOL HCL 50  MG PO TABS Oral Take 1 tablet (50 mg total) by mouth every 6 (six) hours as needed for pain. Maximum dose= 8 tablets per day 20 tablet 0    BP 125/87  Pulse 95  Temp(Src) 98.7 F (37.1 C) (Oral)  Resp 18  SpO2 99%  LMP 04/17/2011  Physical Exam  Nursing note and vitals reviewed. Constitutional: She appears well-developed and well-nourished.  Musculoskeletal: Normal range of motion. She exhibits tenderness. She exhibits no edema.       Right knee: She exhibits effusion and bony tenderness. She exhibits no erythema and no LCL laxity.       Left knee: She exhibits bony tenderness. She exhibits no effusion, no erythema, normal alignment and no LCL laxity.    ED Course  Procedures (including critical care time)  Labs Reviewed - No data to display No results found.   1. Degenerative arthritis of left knee   2. Degenerative arthritis of right knee       MDM          Barkley Bruns, MD 10/12/11 2145

## 2011-10-22 ENCOUNTER — Emergency Department (HOSPITAL_COMMUNITY)
Admission: EM | Admit: 2011-10-22 | Discharge: 2011-10-23 | Disposition: A | Discharge status: Home / Self Care | Payer: Medicare Other | Attending: Emergency Medicine | Admitting: Emergency Medicine

## 2011-10-22 ENCOUNTER — Encounter (HOSPITAL_COMMUNITY): Payer: Self-pay | Admitting: Emergency Medicine

## 2011-10-22 DIAGNOSIS — R12 Heartburn: Secondary | ICD-10-CM | POA: Insufficient documentation

## 2011-10-22 DIAGNOSIS — R1011 Right upper quadrant pain: Secondary | ICD-10-CM | POA: Insufficient documentation

## 2011-10-22 DIAGNOSIS — K8050 Calculus of bile duct without cholangitis or cholecystitis without obstruction: Secondary | ICD-10-CM

## 2011-10-22 DIAGNOSIS — K589 Irritable bowel syndrome without diarrhea: Secondary | ICD-10-CM | POA: Insufficient documentation

## 2011-10-22 DIAGNOSIS — R110 Nausea: Secondary | ICD-10-CM | POA: Insufficient documentation

## 2011-10-22 DIAGNOSIS — K8020 Calculus of gallbladder without cholecystitis without obstruction: Secondary | ICD-10-CM | POA: Insufficient documentation

## 2011-10-22 DIAGNOSIS — G35 Multiple sclerosis: Secondary | ICD-10-CM | POA: Insufficient documentation

## 2011-10-22 LAB — COMPREHENSIVE METABOLIC PANEL
AST: 75 U/L — ABNORMAL HIGH (ref 0–37)
Albumin: 3.7 g/dL (ref 3.5–5.2)
Alkaline Phosphatase: 215 U/L — ABNORMAL HIGH (ref 39–117)
BUN: 5 mg/dL — ABNORMAL LOW (ref 6–23)
CO2: 29 mEq/L (ref 19–32)
Chloride: 96 mEq/L (ref 96–112)
GFR calc non Af Amer: >90 mL/min (ref >90)
Potassium: 3.1 mEq/L — ABNORMAL LOW (ref 3.5–5.1)
Total Bilirubin: 0.2 mg/dL — ABNORMAL LOW (ref 0.3–1.2)

## 2011-10-22 LAB — DIFFERENTIAL
Basophils Absolute: 0 10*3/uL (ref 0.0–0.1)
Basophils Relative: 0 % (ref 0–1)
Lymphocytes Relative: 39 % (ref 12–46)
Monocytes Absolute: 0.6 10*3/uL (ref 0.1–1.0)
Monocytes Relative: 5 % (ref 3–12)
Neutro Abs: 5.3 10*3/uL (ref 1.7–7.7)
Neutrophils Relative %: 49 % (ref 43–77)

## 2011-10-22 LAB — CBC
HCT: 41.6 % (ref 36.0–46.0)
Hemoglobin: 14.3 g/dL (ref 12.0–15.0)
MCHC: 34.4 g/dL (ref 30.0–36.0)
WBC: 10.7 10*3/uL — ABNORMAL HIGH (ref 4.0–10.5)

## 2011-10-22 NOTE — ED Notes (Signed)
PT. REPORTS RUQ PAIN WITH NAUSEA AND VOMITTING FOR 3 DAYS , DENIED DIARRHEA OR FEVER.

## 2011-10-23 ENCOUNTER — Ambulatory Visit (HOSPITAL_COMMUNITY): Admission: RE | Admit: 2011-10-23 | Payer: Medicare Other | Source: Ambulatory Visit

## 2011-10-23 LAB — URINALYSIS, ROUTINE W REFLEX MICROSCOPIC
Bilirubin Urine: NEGATIVE
Glucose, UA: NEGATIVE mg/dL
Ketones, ur: NEGATIVE mg/dL
Leukocytes, UA: NEGATIVE
Nitrite: NEGATIVE
Protein, ur: NEGATIVE mg/dL
Specific Gravity, Urine: 1.005 (ref 1.005–1.030)
Urobilinogen, UA: 0.2 mg/dL (ref 0.0–1.0)
pH: 7 (ref 5.0–8.0)

## 2011-10-23 LAB — URINE MICROSCOPIC-ADD ON

## 2011-10-23 LAB — POCT PREGNANCY, URINE: Preg Test, Ur: NEGATIVE

## 2011-10-23 MED ORDER — SODIUM CHLORIDE 0.9 % IV SOLN
Freq: Once | INTRAVENOUS | Status: AC
  Administered 2011-10-23: 02:00:00 via INTRAVENOUS

## 2011-10-23 MED ORDER — HYDROMORPHONE HCL PF 1 MG/ML IJ SOLN
1.0000 mg | Freq: Once | INTRAMUSCULAR | Status: AC
  Administered 2011-10-23: 1 mg via INTRAVENOUS
  Filled 2011-10-23: qty 1

## 2011-10-23 MED ORDER — OXYCODONE-ACETAMINOPHEN 5-325 MG PO TABS: 1.0000 | ORAL_TABLET | Freq: Four times a day (QID) | ORAL | Status: AC | PRN

## 2011-10-23 MED ORDER — ONDANSETRON HCL 4 MG/2ML IJ SOLN
4.0000 mg | Freq: Once | INTRAMUSCULAR | Status: AC
  Administered 2011-10-23: 4 mg via INTRAVENOUS
  Filled 2011-10-23: qty 2

## 2011-10-23 NOTE — ED Provider Notes (Signed)
History     CSN: [id number] Arrival date & time: 10/22/2011  9:40 PM   First MD Initiated Contact with Patient 10/22/11 2356      Chief Complaint  Patient presents with  . Abdominal Pain    (Consider location/radiation/quality/duration/timing/severity/associated sxs/prior treatment) HPI Comments: Patient has Sphincter of Odi dysfunction post choly and occasionaly will get a small stone in the duct   She states this feels the same RUQ pain nausea   She is followed locally by Dr. [name] and has a specialist at [facility] for the same   Patient is a 45 y.o. female presenting with abdominal pain. The history is provided by the patient.  Abdominal Pain The primary symptoms of the illness include abdominal pain and nausea. The primary symptoms of the illness do not include fever, fatigue, vomiting or diarrhea. The current episode started 3 to 5 hours ago. The onset of the illness was gradual. The problem has been gradually worsening.  The patient states that she believes she is currently not pregnant. Additional symptoms associated with the illness include heartburn. Symptoms associated with the illness do not include chills or constipation.    Past Medical History  Diagnosis Date  . Multiple sclerosis   . Irritable bowel syndrome   . Arthritis     Past Surgical History  Procedure Date  . Sphincterotomy     Family History  Problem Relation Age of Onset  . Cancer Mother   . Hypertension Father     History  Substance Use Topics  . Smoking status: Current Everyday Smoker  . Smokeless tobacco: Not on file  . Alcohol Use: No    OB History    Grav Para Term Preterm Abortions TAB SAB Ect Mult Living                  Review of Systems  Constitutional: Negative for fever, chills and fatigue.  Eyes: Negative.   Respiratory: Negative.   Cardiovascular: Negative.   Gastrointestinal: Positive for heartburn, nausea and abdominal pain. Negative for vomiting, diarrhea, constipation  and abdominal distention.  Genitourinary: Negative.   Musculoskeletal: Negative.   Skin: Negative.   Neurological: Negative.   Hematological: Negative.   Psychiatric/Behavioral: Negative.     Allergies  Erythromycin and Morphine and related  Home Medications   Current Outpatient Rx  Name Route Sig Dispense Refill  . BUSPIRONE HCL 10 MG PO TABS Oral Take 10 mg by mouth 3 (three) times daily.      . CYCLOBENZAPRINE HCL 10 MG PO TABS Oral Take 10 mg by mouth 3 (three) times daily as needed. For muscle spasms.    . DICLOFENAC SODIUM 75 MG PO TBEC Oral Take 75 mg by mouth 2 (two) times daily.      Marland Kitchen ESCITALOPRAM OXALATE 20 MG PO TABS Oral Take 30 mg by mouth daily.      Marland Kitchen FAMOTIDINE 20 MG PO TABS Oral Take 20 mg by mouth daily.      Marland Kitchen PRESCRIPTION MEDICATION Oral Take 80 mg by mouth daily. Methadone    . OXYCODONE-ACETAMINOPHEN 5-325 MG PO TABS Oral Take 1-2 tablets by mouth every 6 (six) hours as needed for pain. 20 tablet 0    BP 122/74  Pulse 93  Temp(Src) 97.8 F (36.6 C) (Oral)  Resp 20  SpO2 97%  LMP 04/17/2011  Physical Exam  Constitutional: She appears well-developed and well-nourished.  HENT:  Head: Normocephalic.  Eyes: Pupils are equal, round, and reactive to light.  Neck:  Normal range of motion.  Cardiovascular: Normal rate.   Pulmonary/Chest: Breath sounds normal.  Abdominal: She exhibits no distension. There is tenderness in the right upper quadrant and epigastric area.    ED Course  Procedures (including critical care time)  Labs Reviewed  CBC - Abnormal; Notable for the following:    WBC 10.7 (*) WHITE COUNT CONFIRMED ON SMEAR   All other components within normal limits  DIFFERENTIAL - Abnormal; Notable for the following:    Lymphs Abs 4.2 (*)    Eosinophils Relative 6 (*)    All other components within normal limits  COMPREHENSIVE METABOLIC PANEL - Abnormal; Notable for the following:    Potassium 3.1 (*)    BUN 5 (*)    AST 75 (*)    ALT 41 (*)     Alkaline Phosphatase 215 (*)    Total Bilirubin 0.2 (*)    All other components within normal limits  URINALYSIS, ROUTINE W REFLEX MICROSCOPIC - Abnormal; Notable for the following:    APPearance CLOUDY (*)    Hgb urine dipstick SMALL (*)    All other components within normal limits  URINE MICROSCOPIC-ADD ON - Abnormal; Notable for the following:    Squamous Epithelial / LPF FEW (*)    All other components within normal limits  POCT PREGNANCY, URINE  POCT PREGNANCY, URINE   No results found.   1. Biliary colic     Patient does not want to wait for ultra sound would like to go home with RX for pain meds and FU with PCP in the morning will return if unable to FU  MDM  abd pain         Arman Filter, NP 10/23/11 0005  Arman Filter, NP 10/23/11 0301  Arman Filter, NP 10/23/11 0303  Arman Filter, NP 10/23/11 0304  Arman Filter, NP 10/23/11 [phone]

## 2011-10-23 NOTE — ED Provider Notes (Signed)
Medical screening examination/treatment/procedure(s) were performed by non-physician practitioner and as supervising physician I was immediately available for consultation/collaboration. Devoria Albe, MD, FACEP   Ward Givens, MD 10/23/11 0700

## 2011-10-23 NOTE — ED Notes (Signed)
Pt c/o RUQ abdominal pain for the past week.  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  Describes pain as sharp and stabbing.  Denies diarrhea but has been nauseas and had one episode of emesis.

## 2011-12-19 ENCOUNTER — Inpatient Hospital Stay (HOSPITAL_COMMUNITY)
Admission: EM | Admit: 2011-12-19 | Discharge: 2011-12-22 | DRG: 444 | Disposition: A | Discharge status: Home / Self Care | Payer: Medicare Other | Attending: Family Medicine | Admitting: Family Medicine

## 2011-12-19 ENCOUNTER — Encounter (HOSPITAL_COMMUNITY): Payer: Self-pay | Admitting: *Deleted

## 2011-12-19 DIAGNOSIS — F1120 Opioid dependence, uncomplicated: Secondary | ICD-10-CM | POA: Diagnosis present

## 2011-12-19 DIAGNOSIS — G8929 Other chronic pain: Secondary | ICD-10-CM | POA: Diagnosis present

## 2011-12-19 DIAGNOSIS — K589 Irritable bowel syndrome without diarrhea: Secondary | ICD-10-CM | POA: Insufficient documentation

## 2011-12-19 DIAGNOSIS — E0590 Thyrotoxicosis, unspecified without thyrotoxic crisis or storm: Secondary | ICD-10-CM | POA: Diagnosis not present

## 2011-12-19 DIAGNOSIS — G35 Multiple sclerosis: Secondary | ICD-10-CM | POA: Diagnosis present

## 2011-12-19 DIAGNOSIS — F17200 Nicotine dependence, unspecified, uncomplicated: Secondary | ICD-10-CM | POA: Diagnosis present

## 2011-12-19 DIAGNOSIS — K838 Other specified diseases of biliary tract: Principal | ICD-10-CM | POA: Diagnosis present

## 2011-12-19 DIAGNOSIS — K859 Acute pancreatitis without necrosis or infection, unspecified: Secondary | ICD-10-CM | POA: Diagnosis present

## 2011-12-19 DIAGNOSIS — K834 Spasm of sphincter of Oddi: Secondary | ICD-10-CM | POA: Diagnosis present

## 2011-12-19 HISTORY — DX: Major depressive disorder, single episode, unspecified: F32.9

## 2011-12-19 HISTORY — DX: Depression, unspecified: F32.A

## 2011-12-19 HISTORY — DX: Acute pancreatitis without necrosis or infection, unspecified: K85.90

## 2011-12-19 HISTORY — DX: Anxiety disorder, unspecified: F41.9

## 2011-12-19 LAB — DIFFERENTIAL
Basophils Absolute: 0 10*3/uL (ref 0.0–0.1)
Basophils Relative: 0 % (ref 0–1)
Eosinophils Absolute: 0.5 10*3/uL (ref 0.0–0.7)
Monocytes Absolute: 0.5 10*3/uL (ref 0.1–1.0)
Monocytes Relative: 7 % (ref 3–12)
Neutro Abs: 3.1 10*3/uL (ref 1.7–7.7)

## 2011-12-19 LAB — COMPREHENSIVE METABOLIC PANEL
ALT: 23 U/L (ref 0–35)
Albumin: 3.4 g/dL — ABNORMAL LOW (ref 3.5–5.2)
Calcium: 10.2 mg/dL (ref 8.4–10.5)
GFR calc Af Amer: >90 mL/min (ref >90)
Glucose, Bld: 78 mg/dL (ref 70–99)
Sodium: 138 mEq/L (ref 135–145)
Total Protein: 6.6 g/dL (ref 6.0–8.3)

## 2011-12-19 LAB — URINALYSIS, ROUTINE W REFLEX MICROSCOPIC
Bilirubin Urine: NEGATIVE
Leukocytes, UA: NEGATIVE
Nitrite: NEGATIVE
Specific Gravity, Urine: 1.004 — ABNORMAL LOW (ref 1.005–1.030)
Urobilinogen, UA: 0.2 mg/dL (ref 0.0–1.0)
pH: 7 (ref 5.0–8.0)

## 2011-12-19 LAB — CBC
HCT: 39.5 % (ref 36.0–46.0)
Hemoglobin: 13.4 g/dL (ref 12.0–15.0)
MCH: 30.9 pg (ref 26.0–34.0)
MCHC: 33.9 g/dL (ref 30.0–36.0)
RDW: 12.6 % (ref 11.5–15.5)

## 2011-12-19 LAB — LIPID PANEL: LDL Cholesterol: 106 mg/dL — ABNORMAL HIGH (ref 0–99)

## 2011-12-19 LAB — URINE MICROSCOPIC-ADD ON

## 2011-12-19 LAB — LIPASE, BLOOD: Lipase: 111 U/L — ABNORMAL HIGH (ref 11–59)

## 2011-12-19 MED ORDER — POTASSIUM CHLORIDE IN NACL 20-0.9 MEQ/L-% IV SOLN
INTRAVENOUS | Status: DC
  Administered 2011-12-19: 22:00:00 via INTRAVENOUS
  Administered 2011-12-20: 125 mL/h via INTRAVENOUS
  Administered 2011-12-20 – 2011-12-21 (×4): via INTRAVENOUS
  Administered 2011-12-22: 125 mL/h via INTRAVENOUS
  Administered 2011-12-22: 05:00:00 via INTRAVENOUS
  Filled 2011-12-19 (×11): qty 1000

## 2011-12-19 MED ORDER — ENOXAPARIN SODIUM 40 MG/0.4ML ~~LOC~~ SOLN
40.0000 mg | SUBCUTANEOUS | Status: DC
  Administered 2011-12-19 – 2011-12-21 (×3): 40 mg via SUBCUTANEOUS
  Filled 2011-12-19 (×4): qty 0.4

## 2011-12-19 MED ORDER — DIPHENHYDRAMINE HCL 50 MG/ML IJ SOLN: 12.5000 mg | Freq: Four times a day (QID) | INTRAMUSCULAR | Status: DC | PRN

## 2011-12-19 MED ORDER — ONDANSETRON HCL 4 MG/2ML IJ SOLN
4.0000 mg | Freq: Four times a day (QID) | INTRAMUSCULAR | Status: DC | PRN
  Administered 2011-12-20: 4 mg via INTRAVENOUS
  Filled 2011-12-19 (×2): qty 2

## 2011-12-19 MED ORDER — NALOXONE HCL 0.4 MG/ML IJ SOLN: 0.4000 mg | INTRAMUSCULAR | Status: DC | PRN

## 2011-12-19 MED ORDER — PANTOPRAZOLE SODIUM 40 MG PO TBEC
40.0000 mg | DELAYED_RELEASE_TABLET | Freq: Every day | ORAL | Status: DC
  Administered 2011-12-19 – 2011-12-22 (×4): 40 mg via ORAL
  Filled 2011-12-19 (×4): qty 1

## 2011-12-19 MED ORDER — BUSPIRONE HCL 10 MG PO TABS
10.0000 mg | ORAL_TABLET | Freq: Two times a day (BID) | ORAL | Status: DC
  Administered 2011-12-19 – 2011-12-22 (×6): 10 mg via ORAL
  Filled 2011-12-19 (×7): qty 1

## 2011-12-19 MED ORDER — ALUM & MAG HYDROXIDE-SIMETH 200-200-20 MG/5ML PO SUSP: 30.0000 mL | Freq: Four times a day (QID) | ORAL | Status: DC | PRN

## 2011-12-19 MED ORDER — ACETAMINOPHEN 325 MG PO TABS: 650.0000 mg | ORAL_TABLET | Freq: Four times a day (QID) | ORAL | Status: DC | PRN

## 2011-12-19 MED ORDER — HYDROMORPHONE HCL PF 1 MG/ML IJ SOLN: 1.0000 mg | Freq: Once | INTRAMUSCULAR | Status: DC

## 2011-12-19 MED ORDER — METHADONE HCL 10 MG PO TABS
80.0000 mg | ORAL_TABLET | Freq: Every day | ORAL | Status: DC
  Administered 2011-12-20 – 2011-12-22 (×3): 80 mg via ORAL
  Filled 2011-12-19 (×3): qty 8

## 2011-12-19 MED ORDER — ESCITALOPRAM OXALATE 20 MG PO TABS
30.0000 mg | ORAL_TABLET | Freq: Every day | ORAL | Status: DC
  Administered 2011-12-19 – 2011-12-22 (×4): 30 mg via ORAL
  Filled 2011-12-19 (×4): qty 1

## 2011-12-19 MED ORDER — ONDANSETRON HCL 4 MG/2ML IJ SOLN
4.0000 mg | Freq: Once | INTRAMUSCULAR | Status: AC
  Administered 2011-12-19: 4 mg via INTRAVENOUS
  Filled 2011-12-19: qty 2

## 2011-12-19 MED ORDER — NICOTINE 14 MG/24HR TD PT24
14.0000 mg | MEDICATED_PATCH | Freq: Every day | TRANSDERMAL | Status: DC
  Administered 2011-12-19 – 2011-12-22 (×4): 14 mg via TRANSDERMAL
  Filled 2011-12-19 (×4): qty 1

## 2011-12-19 MED ORDER — DIPHENHYDRAMINE HCL 12.5 MG/5ML PO ELIX: 12.5000 mg | ORAL_SOLUTION | Freq: Four times a day (QID) | ORAL | Status: DC | PRN

## 2011-12-19 MED ORDER — HYDROMORPHONE 0.3 MG/ML IV SOLN
INTRAVENOUS | Status: DC
  Administered 2011-12-19: 21:00:00 via INTRAVENOUS
  Administered 2011-12-20: 2.69 mg via INTRAVENOUS
  Administered 2011-12-20: 3.39 mg via INTRAVENOUS
  Administered 2011-12-20: 2.99 mg via INTRAVENOUS
  Administered 2011-12-20: 3.79 mg via INTRAVENOUS
  Filled 2011-12-19: qty 25

## 2011-12-19 MED ORDER — ACETAMINOPHEN 650 MG RE SUPP: 650.0000 mg | Freq: Four times a day (QID) | RECTAL | Status: DC | PRN

## 2011-12-19 MED ORDER — PNEUMOCOCCAL VAC POLYVALENT 25 MCG/0.5ML IJ INJ
0.5000 mL | INJECTION | INTRAMUSCULAR | Status: AC
  Administered 2011-12-20: 0.5 mL via INTRAMUSCULAR
  Filled 2011-12-19: qty 0.5

## 2011-12-19 MED ORDER — METHADONE HCL 10 MG/5ML PO SOLN: 80.0000 mg | Freq: Every day | ORAL | Status: DC

## 2011-12-19 MED ORDER — SODIUM CHLORIDE 0.9 % IJ SOLN: 9.0000 mL | INTRAMUSCULAR | Status: DC | PRN

## 2011-12-19 MED ORDER — SODIUM CHLORIDE 0.9 % IV SOLN: INTRAVENOUS | Status: DC

## 2011-12-19 MED ORDER — HYDROMORPHONE 0.3 MG/ML IV SOLN
INTRAVENOUS | Status: AC
  Filled 2011-12-19: qty 25

## 2011-12-19 NOTE — H&P (Signed)
[name] is an 46 y.o. female.   Chief Complaint: abdominal pain HPI:  46 y/o c/f with abdominal pain, epigastric, boring/burning, radiates to back with colic type pain. Elevated Lipase 111, and alk phos 234. Hx of sphincter of Oddi dysfunction. Had gallstone pancreatitis back in '03. Had lap cholecystectomy. Had ERCP 4 years ago and had sphincterotomy with stent placement. She is followed by Dr. [name] at [facility] and Dr. [name] with GI here at [facility].  Patient has not had an attack for the last 4 years approx. She has no stool changes, no bleeding, no diet changes. She has a history of using too much of her pain medication and is now on 80 mg of methadone daily. This is not solely for pancreatitis, she also has chronic pain from multiple sclerosis.  ROS:  Pertinent items are noted in HPI. No fever, chills, night sweats, weight loss.  Labs: K: 3.4 Alk Phos: 243 Lipase: 111  WBC: wnl H/H: wnl  U/A negative Past Medical History  Diagnosis Date  . Multiple sclerosis   . Irritable bowel syndrome   . Arthritis   . Pancreatitis     Past Surgical History  Procedure Date  . Sphincterotomy     Family History  Problem Relation Age of Onset  . Cancer Mother   . Hypertension Father    Social History:  reports that she has been smoking.  She does not have any smokeless tobacco history on file. She reports that she does not drink alcohol or use illicit drugs.  Allergies:  Allergies  Allergen Reactions  . Erythromycin Nausea And Vomiting  . Morphine And Related Other (See Comments)    IV whelps on arms.    Medications Prior to Admission  Medication Dose Route Frequency Provider Last Rate Last Dose  . 0.9 %  sodium chloride infusion   Intravenous Continuous Doug Sou, MD      . HYDROmorphone (DILAUDID) injection 1 mg  1 mg Intravenous Once Doug Sou, MD      . ondansetron (ZOFRAN) injection 4 mg  4 mg Intravenous Once Doug Sou, MD       Medications Prior to  Admission  Medication Sig Dispense Refill  . busPIRone (BUSPAR) 10 MG tablet Take 10 mg by mouth 2 (two) times daily. upto three times a day      . cyclobenzaprine (FLEXERIL) 10 MG tablet Take 10 mg by mouth 3 (three) times daily as needed. For muscle spasms.      . diclofenac (VOLTAREN) 75 MG EC tablet Take 75 mg by mouth daily.       Marland Kitchen escitalopram (LEXAPRO) 20 MG tablet Take 30 mg by mouth daily.       Blood pressure 127/84, pulse 102, temperature 98 F (36.7 C), temperature source Oral, resp. rate 19, SpO2 95.00%. Physical Exam  General: C/F supine, in mild discomfort, a0x3. Friendly. Eye - she almost has proptosis, Pupils Equal Round Reactive to light, Extraocular movements intact Lungs:  Normal respiratory effort, chest expands symmetrically. Lungs are clear to auscultation, no crackles or wheezes. Heart - Regular rate and rhythm.  No murmurs, gallops or rubs.    Abdomen: Firm, but not rigid, tender epigastric region, no masses, organomegaly or hernias noted.  No involuntary guarding or rebound. Scars from lap chole and ventral hernia repair. Stretch marks. Extremities:  No cyanosis, edema, or deformity noted with good range of motion of all major joints.   Skin:  Intact without suspicious lesions or rashes Mouth -  no lesions, mucous membranes are moist, no decaying teeth   Assessment/Plan 46 y/o female with pancreatitis and hx of sphincter of Oddi dysfunction.  1. Pancreatitis Sphincter of Oddi spasm vs. Sludge/stone in CBD (bili is normal, but Alk Phos is elevated) Her Lipase has been in the 100's in past flares. Denies alcohol use, no culpable meds. - ultrasound to evaluate CBD - Lipid panel ( rule out tryglyceridemia) - NPO  - NS @ 125 - continue methadone 80, PCA dilaudid for breakthrough (low dose) - will notify Dr. [name] in AM  2. Multiple sclerosis - chronic disease without active component at this time  3. Chronic pain Methadone 80 mg daily and PCA dilaudid for  breakthrough pain  4. FENGI: protonix, NPO, IVF NS, Nicotine patch. (smokes half a pack daily)  5. Proph: Lovenox q 24 hrs.  6. Dispo: inpatient for workup and pain control. Possibly home tomorrow pending clinical improvement.  Edd Arbour MD 12/19/2011, 7:24 PM

## 2011-12-19 NOTE — ED Notes (Signed)
Attempted IV x2, paged IV team

## 2011-12-19 NOTE — ED Provider Notes (Signed)
History     CSN: [id number]  Arrival date & time 12/19/11  1444   First MD Initiated Contact with Patient 12/19/11 1754      Chief Complaint  Patient presents with  . Emesis  . Abdominal Pain    (Consider location/radiation/quality/duration/timing/severity/associated sxs/prior treatment) HPI Complains of epigastric pain typical of pancreatitis she suffered in the past onset 3 days ago one episode of vomiting today no treatment prior to coming here pain is moderate at present radiates to back him waxes and wanes not made better or worse by anything no fever one episode of vomiting today admits to diminished appetite Past Medical History  Diagnosis Date  . Multiple sclerosis   . Irritable bowel syndrome   . Arthritis   . Pancreatitis     Past Surgical History  Procedure Date  . Sphincterotomy     Family History  Problem Relation Age of Onset  . Cancer Mother   . Hypertension Father     History  Substance Use Topics  . Smoking status: Current Everyday Smoker  . Smokeless tobacco: Not on file  . Alcohol Use: No    OB History    Grav Para Term Preterm Abortions TAB SAB Ect Mult Living                  Review of Systems  Constitutional: Negative.   HENT: Negative.   Respiratory: Negative.   Cardiovascular: Negative.   Gastrointestinal: Positive for nausea, vomiting and abdominal pain.  Musculoskeletal: Negative.   Skin: Negative.   Neurological: Negative.   Hematological: Negative.   Psychiatric/Behavioral: Negative.   All other systems reviewed and are negative.    Allergies  Erythromycin and Morphine and related  Home Medications   Current Outpatient Rx  Name Route Sig Dispense Refill  . BUSPIRONE HCL 10 MG PO TABS Oral Take 10 mg by mouth 2 (two) times daily. upto three times a day    . CALCIUM CARBONATE-VITAMIN D 500-200 MG-UNIT PO TABS Oral Take 1 tablet by mouth daily.    . CYCLOBENZAPRINE HCL 10 MG PO TABS Oral Take 10 mg by mouth 3 (three)  times daily as needed. For muscle spasms.    . DICLOFENAC SODIUM 75 MG PO TBEC Oral Take 75 mg by mouth daily.     Marland Kitchen DICLOFENAC SODIUM 1 % TD GEL Topical Apply 1 application topically 2 (two) times daily. Knees, hips, neck    . ESCITALOPRAM OXALATE 20 MG PO TABS Oral Take 30 mg by mouth daily.     Marland Kitchen LANSOPRAZOLE 15 MG PO CPDR Oral Take 15 mg by mouth daily.    Marland Kitchen METHADONE HCL 10 MG/5ML PO SOLN Oral Take 80 mg by mouth daily.    Marland Kitchen POTASSIUM CHLORIDE CRYS ER 10 MEQ PO TBCR Oral Take 10 mEq by mouth daily.      BP 127/84  Pulse 102  Temp(Src) 98 F (36.7 C) (Oral)  Resp 19  SpO2 95%  Physical Exam  Nursing note and vitals reviewed. Constitutional: She appears well-developed and well-nourished.  HENT:  Head: Normocephalic and atraumatic.  Eyes: Conjunctivae are normal. Pupils are equal, round, and reactive to light.  Neck: Neck supple. No tracheal deviation present. No thyromegaly present.  Cardiovascular: Normal rate and regular rhythm.   No murmur heard. Pulmonary/Chest: Effort normal and breath sounds normal.  Abdominal: Soft. Bowel sounds are normal. She exhibits no distension. There is tenderness.       Tender at epigastric  Genitourinary:  Amenorrhea for 5 months  Musculoskeletal: Normal range of motion. She exhibits no edema and no tenderness.  Neurological: She is alert. Coordination normal.  Skin: Skin is warm and dry. No rash noted.  Psychiatric: She has a normal mood and affect.    ED Course  Procedures (including critical care time) Spoke with family medicine resident MD to arrange for hospital admission Labs Reviewed  LIPASE, BLOOD - Abnormal; Notable for the following:    Lipase 111 (*)    All other components within normal limits  COMPREHENSIVE METABOLIC PANEL - Abnormal; Notable for the following:    Potassium 3.4 (*)    BUN 4 (*)    Albumin 3.4 (*)    AST 42 (*)    Alkaline Phosphatase 243 (*)    All other components within normal limits    URINALYSIS, ROUTINE W REFLEX MICROSCOPIC  CBC  DIFFERENTIAL   No results found.   No diagnosis found.   Results for orders placed during the hospital encounter of 12/19/11  URINALYSIS, ROUTINE W REFLEX MICROSCOPIC      Component Value Range   Color, Urine YELLOW  YELLOW    APPearance CLEAR  CLEAR    Specific Gravity, Urine 1.004 (*) 1.005 - 1.030    pH 7.0  5.0 - 8.0    Glucose, UA NEGATIVE  NEGATIVE (mg/dL)   Hgb urine dipstick TRACE (*) NEGATIVE    Bilirubin Urine NEGATIVE  NEGATIVE    Ketones, ur NEGATIVE  NEGATIVE (mg/dL)   Protein, ur NEGATIVE  NEGATIVE (mg/dL)   Urobilinogen, UA 0.2  0.0 - 1.0 (mg/dL)   Nitrite NEGATIVE  NEGATIVE    Leukocytes, UA NEGATIVE  NEGATIVE   LIPASE, BLOOD      Component Value Range   Lipase 111 (*) 11 - 59 (U/L)  COMPREHENSIVE METABOLIC PANEL      Component Value Range   Sodium 138  135 - 145 (mEq/L)   Potassium 3.4 (*) 3.5 - 5.1 (mEq/L)   Chloride 98  96 - 112 (mEq/L)   CO2 30  19 - 32 (mEq/L)   Glucose, Bld 78  70 - 99 (mg/dL)   BUN 4 (*) 6 - 23 (mg/dL)   Creatinine, Ser 1.61  0.50 - 1.10 (mg/dL)   Calcium 09.6  8.4 - 10.5 (mg/dL)   Total Protein 6.6  6.0 - 8.3 (g/dL)   Albumin 3.4 (*) 3.5 - 5.2 (g/dL)   AST 42 (*) 0 - 37 (U/L)   ALT 23  0 - 35 (U/L)   Alkaline Phosphatase 243 (*) 39 - 117 (U/L)   Total Bilirubin 0.4  0.3 - 1.2 (mg/dL)   GFR calc non Af Amer >90  >90 (mL/min)   GFR calc Af Amer >90  >90 (mL/min)  URINE MICROSCOPIC-ADD ON      Component Value Range   Squamous Epithelial / LPF FEW (*) RARE    WBC, UA 0-2  <3 (WBC/hpf)   RBC / HPF 0-2  <3 (RBC/hpf)   Bacteria, UA RARE  RARE    No results found.  MDM  Plan admit IV hydration pain control nausea control, bowel rest Diagnosis #1pancreatitis  #2 hypokalemia      Doug Sou, MD 12/20/11 [phone]

## 2011-12-19 NOTE — ED Notes (Signed)
Pt changing into gown. Pt c/o nausea

## 2011-12-19 NOTE — ED Notes (Signed)
To ed for eval of abd pain and vomiting. Skin w/d, resp e/u. Hx of pancreatitis

## 2011-12-19 NOTE — ED Notes (Signed)
Pt c/o upper quad abd pain and n/v. Pt states she has hx of pancreatitis and of having elevated liver enzymes with similar sx. NAD at this time. Pt informed of need to collect urine.

## 2011-12-19 NOTE — ED Notes (Signed)
Called and gave report to Thil.

## 2011-12-20 ENCOUNTER — Inpatient Hospital Stay (HOSPITAL_COMMUNITY): Payer: Medicare Other

## 2011-12-20 DIAGNOSIS — K859 Acute pancreatitis without necrosis or infection, unspecified: Secondary | ICD-10-CM

## 2011-12-20 LAB — CBC
Hemoglobin: 13 g/dL (ref 12.0–15.0)
MCH: 31 pg (ref 26.0–34.0)
MCHC: 34.2 g/dL (ref 30.0–36.0)
RDW: 12.9 % (ref 11.5–15.5)

## 2011-12-20 LAB — COMPREHENSIVE METABOLIC PANEL
ALT: 18 U/L (ref 0–35)
AST: 33 U/L (ref 0–37)
Alkaline Phosphatase: 204 U/L — ABNORMAL HIGH (ref 39–117)
CO2: 26 mEq/L (ref 19–32)
Chloride: 105 mEq/L (ref 96–112)
GFR calc Af Amer: >90 mL/min (ref >90)
GFR calc non Af Amer: >90 mL/min (ref >90)
Glucose, Bld: 94 mg/dL (ref 70–99)
Potassium: 4 mEq/L (ref 3.5–5.1)
Sodium: 141 mEq/L (ref 135–145)
Total Bilirubin: 0.3 mg/dL (ref 0.3–1.2)

## 2011-12-20 LAB — PREGNANCY, URINE: Preg Test, Ur: NEGATIVE

## 2011-12-20 MED ORDER — HYDROMORPHONE 0.3 MG/ML IV SOLN
INTRAVENOUS | Status: AC
  Administered 2011-12-20: 05:00:00
  Filled 2011-12-20: qty 25

## 2011-12-20 MED ORDER — HYDROMORPHONE HCL PF 1 MG/ML IJ SOLN
1.0000 mg | INTRAMUSCULAR | Status: DC | PRN
  Administered 2011-12-20 – 2011-12-22 (×14): 1 mg via INTRAVENOUS
  Filled 2011-12-20 (×14): qty 1

## 2011-12-20 MED ORDER — CYCLOBENZAPRINE HCL 10 MG PO TABS
10.0000 mg | ORAL_TABLET | Freq: Three times a day (TID) | ORAL | Status: DC | PRN
  Administered 2011-12-21 – 2011-12-22 (×3): 10 mg via ORAL
  Filled 2011-12-20 (×4): qty 1

## 2011-12-20 MED ORDER — HYDROMORPHONE 0.3 MG/ML IV SOLN
INTRAVENOUS | Status: AC
  Filled 2011-12-20: qty 25

## 2011-12-20 MED ORDER — ONDANSETRON HCL 4 MG/2ML IJ SOLN
4.0000 mg | INTRAMUSCULAR | Status: DC | PRN
  Administered 2011-12-20 – 2011-12-22 (×10): 4 mg via INTRAVENOUS
  Filled 2011-12-20 (×9): qty 2

## 2011-12-20 NOTE — Progress Notes (Signed)
Seen and examined.  Please see my note from earlier today in co signing the H&PE.  Agree with Dr. Sherron Flemings Cruz's management.

## 2011-12-20 NOTE — H&P (Signed)
Seen and examined.  Discussed with Dr. Rivka Safer and full FM team.  Agree with management.  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  She seems to have greatly benefited for stenting of the ampula of Vater.  Per reports that stent has fallen out - perhaps being the explanation of why her current flair.    Other issues as per Dr. Rolene Arbour note.  Given her lack of recent hospitalizations, I think it is prudent to respond normally to her subjective complaints of pain even with her hx of chronic pain syndrome.  We will follow this closely as the hospitalization progresses.  Finally, she does have prominent eyes with the sclera showing above the iris.  This can be seen in hyperexcitable states (pain, stimulants, anxiety) and with Graves ophthalmopathy.  TSH has been ordered.

## 2011-12-20 NOTE — Progress Notes (Signed)
Subjective: Complains of continued pain in epigastric region and radiating to back. Says it is 6/10 most of the time, but sometimes worse or sharp in nature after drinking. Desires stronger pain control although already on a PCA and Methadone. Says nausea is controlled with Zofran and has no had any vomiting. Interested in attempting to have clear liquids.  Objective: Vital signs in last 24 hours: Temp:  [98 F (36.7 C)-98.2 F (36.8 C)] 98.2 F (36.8 C) (02/13 0534) Pulse Rate:  [91-120] 91  (02/13 0534) Resp:  [14-19] 16  (02/13 0800) BP: (102-147)/(68-84) 102/68 mmHg (02/13 0534) SpO2:  [93 %-100 %] 93 % (02/13 0800) Weight:  [70.5 kg (155 lb 6.8 oz)] 70.5 kg (155 lb 6.8 oz) (02/12 2103) Weight change:  Last BM Date: 12/19/11  Intake/Output from previous day: 02/12 0701 - 02/13 0700 In: 1168.8 [I.V.:1168.8] Out: 650 [Urine:650] Intake/Output this shift:   Physical Exam General: Sitting up in bed. Comfortable. Pleasant. HEENT: EOMI. Mouth Mucosa Moist. PULM: Normal WOB. CTAB. CV: RRR. No murmurs rubs or gallops. Abdomen: Soft. Tender epigastric region to mild palpation. NM. Mild distension. No guarding or rebound. Scars from lap chole and ventral hernia repair. Extremities: No cyanosis, clubbing or edema.  Lab Results:  Basename 12/20/11 0749 12/19/11 1835  WBC 6.8 7.1  HGB 13.0 13.4  HCT 38.0 39.5  PLT 204 246   BMET  Basename 12/20/11 0749 12/19/11 1533  NA 141 138  K 4.0 3.4*  CL 105 98  CO2 26 30  GLUCOSE 94 78  BUN 4* 4*  CREATININE 0.54 0.51  CALCIUM 8.6 10.2    Studies/Results: US Abdomen Complete  12/20/2011  *RADIOLOGY REPORT*  Clinical Data:  Pancreatitis.  Elevated alkaline phosphatase. History of cholecystectomy.  COMPLETE ABDOMINAL ULTRASOUND  Comparison:  CT abdomen and pelvis 08/02/2010.  Findings:  Gallbladder:  Removed.  Common bile duct:  Measures 0.8 cm.  Liver:  No focal lesion or intrahepatic biliary ductal dilatation. Mildly increased  echogenicity noted.  IVC:  Appears normal.  Pancreas:  No focal abnormality seen.  Spleen:  Measures 6.1 cm and appears normal.  Right Kidney:  Measures 10.0 cm and appears normal.  Left Kidney:  Measures 10.9 cm and appears normal.  Abdominal aorta:  No aneurysm identified.  IMPRESSION:  1.  No acute finding. 2.  Fatty infiltration of liver. 3.  Status post cholecystectomy.  Original Report Authenticated By: Bernadene Bell. Maricela Curet, M.D.    Medications: I have reviewed the patient's current medications.  Assessment/Plan: 46 y/o female with pancreatitis and hx of sphincter of Oddi dysfunction.   1. Pancreatitis  Sphincter of Oddi spasm likely as history of cholecystectomy and ERCP with CBD stenting (Bili is normal, Alk Phos elevated but trending down, Lipase mildly elevated)  - Her Lipase has been in the 100's in past flares. 111 on this admission. - Denies alcohol use, no culpable meds.  - [place] Abd was unremarkable. CBD 0.8 mm. - Lipid panel: LDL 106, HDL 50, Tgy 118 - Was NPO initially. Will advance diet as tolerated today. - NS @ 125  - Continue methadone 80, PCA dilaudid for breakthrough (low dose)  - Dr. [name] (GI) notified and will see patient for follow-up outpatient.  2. Multiple sclerosis  - chronic disease without active component at this time   3. Chronic pain  - Methadone 80 mg daily and PCA dilaudid for breakthrough pain   4. FENGI: protonix, Advance diet as tolerated, IVF NS, Nicotine patch. (smokes half a  pack daily)  5. Proph: Lovenox q 24 hrs.  6. Dispo: Pending pain control and tolerating diet.   LOS: 1 day   Beverly Milch, [facility] 12/20/2011, 10:23 AM   PGY-2 ADDENDUM:  I have seen patient and examined patient with Beverly Milch and I agree with above findings with the following additions:  PHYSICAL EXAM: General: in no acute distress Abdomen: mild distension, but not rigid.  Hyperactive BS.  Mild tenderness on palpation diffusely.  Plan  is to continue Dilaudid PCA for now.   Consider changing to Dilaudid 0.5- 1mg  every 3 hr PRN. Wean pain regimen as tolerated. Start clear liquids and advance diet slowly. Add Flexeril for chronic pain per home regimen. Inpatient status until able to tolerate PO and pain is well controlled on home Methadone.  [name],Dorianna Mckiver

## 2011-12-21 LAB — T3, FREE: T3, Free: 4.8 pg/mL — ABNORMAL HIGH (ref 2.3–4.2)

## 2011-12-21 LAB — TSH: TSH: <0.008 u[IU]/mL (ref 0.350–4.500)

## 2011-12-21 NOTE — Progress Notes (Signed)
Discussed in rounds and agree with MS4 gupta and Dr. Tye Savoy' management

## 2011-12-21 NOTE — Progress Notes (Signed)
**Note Alicia-Identified via Obfuscation** Subjective: Doing well this morning. Says pain has decreased some and that she is more comfortable with the Dilaudid injection than the PCA. Pain is 4/10. Interested in advancing her diet to full liquids. No new complaints. One normal BM yesterday and nausea is controlled.  Objective: Vital signs in last 24 hours: Temp:  [98.3 F (36.8 C)-98.4 F (36.9 C)] 98.4 F (36.9 C) (02/14 0600) Pulse Rate:  [91-95] 95  (02/14 0600) Resp:  [16] 16  (02/14 0600) BP: (106-123)/(71-82) 123/82 mmHg (02/14 0600) SpO2:  [95 %-97 %] 97 % (02/14 0600) Weight change:  Last BM Date: 12/19/11  Intake/Output from previous day: 02/13 0701 - 02/14 0700 In: 2875 [I.V.:2875] Out: 1600 [Urine:1600] Intake/Output this shift: Total I/O In: 240 [P.O.:240] Out: -   Physical Exam  General: Sitting up in bed. Comfortable. Pleasant.  HEENT: EOMI. Mouth Mucosa Moist. Mild proptosis.  PULM: Normal WOB. CTAB.  CV: RRR. No murmurs rubs or gallops.  Abdomen: Soft. Tender epigastric region to moderate palpation. Mild distension. No masses. No guarding or rebound. Scars from lap chole and ventral hernia repair.  Extremities: No cyanosis, clubbing or edema.  Lab Results:  Basename 12/20/11 0749 12/19/11 1835  WBC 6.8 7.1  HGB 13.0 13.4  HCT 38.0 39.5  PLT 204 246   BMET  Basename 12/20/11 0749 12/19/11 1533  NA 141 138  K 4.0 3.4*  CL 105 98  CO2 26 30  GLUCOSE 94 78  BUN 4* 4*  CREATININE 0.54 0.51  CALCIUM 8.6 10.2    Studies/Results: US Abdomen Complete  12/20/2011  *RADIOLOGY REPORT*  Clinical Data:  Pancreatitis.  Elevated alkaline phosphatase. History of cholecystectomy.  COMPLETE ABDOMINAL ULTRASOUND  Comparison:  CT abdomen and pelvis 08/02/2010.  Findings:  Gallbladder:  Removed.  Common bile duct:  Measures 0.8 cm.  Liver:  No focal lesion or intrahepatic biliary ductal dilatation. Mildly increased echogenicity noted.  IVC:  Appears normal.  Pancreas:  No focal abnormality seen.  Spleen:   Measures 6.1 cm and appears normal.  Right Kidney:  Measures 10.0 cm and appears normal.  Left Kidney:  Measures 10.9 cm and appears normal.  Abdominal aorta:  No aneurysm identified.  IMPRESSION:  1.  No acute finding. 2.  Fatty infiltration of liver. 3.  Status post cholecystectomy.  Original Report Authenticated By: [name]. [name], M.D.    Medications: I have reviewed the patient's current medications.  Assessment/Plan: 46 y/o female with pancreatitis and hx of sphincter of Oddi dysfunction.   1. Pancreatitis  Sphincter of Oddi spasm likely as history of cholecystectomy and ERCP with CBD stenting (Bili is normal, Alk Phos elevated but trending down, Lipase mildly elevated)  - Her Lipase has been in the 100's in past flares. 111 on this admission.  - Denies alcohol abuse, no culpable meds.  - [place] Abd was unremarkable. CBD 0.8 mm.  - Lipid panel: LDL 106, HDL 50, Tgy 118  - Was NPO initially. Will advance diet as tolerated today.  - NS @ 125  - Continue methadone 80, PCA dilaudid for breakthrough (low dose)  - Dr. Elnoria [name] (GI) notified and will see patient for follow-up outpatient.   2. Multiple sclerosis  - chronic disease without active component at this time - fine generalized tremor noted today that patient attributes to her ms. Says this component is always present. Seen by a neurologist outpatient.  3. Chronic pain  - Methadone 80 mg daily and Dilaudid 1 mg injection q3h prn  breakthrough  pain   4. Low TSH: Mild proptosis and TSH less than 0.008. Graves disease possible. - Will check free T3, T4 - Check radioiodine uptake test - Urine pregnancy test negative  5. FENGI: protonix, Advance diet as tolerated, IVF NS, Nicotine patch. (smokes half a pack daily)  6. Proph: Lovenox q 24 hrs.  7. Dispo: Pending pain control and tolerating diet.   LOS: 2 days   [name], [name] 12/21/2011, 9:43 AM  PGY-2 ADDENDUM:  I have seen and examined  patient with [name] and we discussed assessment/plan together.  Patient says her mother is being hospitalized today, so she is hopeful to advance diet and space out Dilaudid injections today so she be discharged sooner than later.  She was able to tolerate clears yesterday without difficulty.  Pain is better controlled but still asking for it every 3 hrs.  Physical Exam: General: Sitting up in bed. Comfortable. Pleasant.  HEENT: EOMI. Mouth Mucosa Moist. Mild proptosis.  PULM: Normal WOB. CTAB.  CV: RRR. No murmurs rubs or gallops.  Abdomen: Tender epigastric region to moderate palpation. Mild distension. No masses. No guarding or rebound.  A/P: Advance diet as tolerated. Wean pain medications to home Methadone regimen. Tremor - likely secondary to MS rather than withdrawal, no history of alcohol abuse per patient. Will monitor.  [name] 12/21/11 10:26 AM Anticipate D/C home in 1-2 days.

## 2011-12-22 ENCOUNTER — Inpatient Hospital Stay (HOSPITAL_COMMUNITY): Payer: Medicare Other

## 2011-12-22 DIAGNOSIS — E0590 Thyrotoxicosis, unspecified without thyrotoxic crisis or storm: Secondary | ICD-10-CM | POA: Diagnosis not present

## 2011-12-22 MED ORDER — OXYCODONE-ACETAMINOPHEN 7.5-325 MG PO TABS: 1.0000 | ORAL_TABLET | Freq: Four times a day (QID) | ORAL | Status: DC | PRN

## 2011-12-22 MED ORDER — SODIUM IODIDE I 131 CAPSULE
12.8000 | Freq: Once | INTRAVENOUS | Status: AC | PRN
  Administered 2011-12-22: 12.8 via ORAL

## 2011-12-22 MED ORDER — OXYCODONE-ACETAMINOPHEN 5-325 MG PO TABS
1.0000 | ORAL_TABLET | ORAL | Status: AC | PRN
  Administered 2011-12-22: 1 via ORAL
  Filled 2011-12-22: qty 1

## 2011-12-22 MED ORDER — ATENOLOL 50 MG PO TABS: 50.0000 mg | ORAL_TABLET | Freq: Every day | ORAL | Status: DC

## 2011-12-22 MED ORDER — SODIUM PERTECHNETATE TC 99M INJECTION
10.0000 | Freq: Once | INTRAVENOUS | Status: AC | PRN
  Administered 2011-12-22: 10 via INTRAVENOUS

## 2011-12-22 MED ORDER — ONDANSETRON HCL 4 MG PO TABS: 4.0000 mg | ORAL_TABLET | Freq: Three times a day (TID) | ORAL | Status: DC | PRN

## 2011-12-22 MED ORDER — ATENOLOL 50 MG PO TABS
50.0000 mg | ORAL_TABLET | Freq: Every day | ORAL | Status: DC
  Administered 2011-12-22: 50 mg via ORAL
  Filled 2011-12-22 (×2): qty 1

## 2011-12-22 NOTE — Discharge Summary (Signed)
Seen and examined.  Agree with DC as outlined by Dr. Rivka Safer.  Would add PO zofran for her mild continued nausea.  FU of hyperthyroidism and scan results by her PCP.

## 2011-12-22 NOTE — Discharge Instructions (Signed)
Please call Endocrinology for an appointment for your hyperthyroidism.  The results of your scan will be available for the Endocrinologist to evaluate.

## 2011-12-22 NOTE — Discharge Summary (Signed)
Physician Discharge Summary  Patient ID: [name] MRN: [id number] DOB: [birth year] Age: 46 y.o.  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 She was found to have pancreatitis and was noted to have a history of sphincter of Oddi dysfunction. Also with concurrent finding of hyperthyroidism.  1. Pancreatitis: Denied alcohol abuse and had no concerning medication use. Ultrasound of her abdomen was unremarkable with a CBD of 0.8 mm. 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 She has a history of cholecystectomy in 2003 and ERCP with CBD stenting four years ago. On admission patient's Lipase was 111, Alk Phos 243 and T. Bili normal. She was hydrated with normal saline, pain was controled with Dilaudid PCA and her diet was advanced as tolerated. Her Lipase had decreased to 71 at time of discharge and patient's pain improved daily. Dr. [name], her GI physician, was notified and may follow-up outpatient.  2. Hyperthyroidism: Patient was noted to have a hyper attentive appearance with the whites of her eyes visible above eyes. She also notes 15 pound weight loss over last two months and was noted to have a mild generalized tremor and tachycardia. Her TSH was less than 0.008 and her free T3 was 4.8. A radioiodine uptake test was done and results were pending at time of discharge. Urine pregnancy test was negative. Should follow-up with endocrinology for management.   3. Multiple sclerosis: Patient reported no active component at this time. She did have a fine generalized tremor noted  that she attributed to her ms. This may also be exacerbated by her hypothyroidism.  4. Chronic pain: Her home Methadone 80 mg daily was continued.  5. PPx: Was placed on Protonix and Lovenox.   Procedures/Imaging:  US Abdomen Complete  12/20/2011  *RADIOLOGY REPORT*  Clinical Data:  Pancreatitis.  Elevated alkaline phosphatase. History of cholecystectomy.  COMPLETE ABDOMINAL ULTRASOUND  Comparison:  CT abdomen and pelvis 08/02/2010.  Findings:  Gallbladder:  Removed.  Common bile duct:  Measures 0.8 cm.  Liver:  No focal lesion or intrahepatic biliary ductal dilatation. Mildly increased echogenicity noted.  IVC:  Appears normal.  Pancreas:  No focal abnormality seen.  Spleen:  Measures 6.1 cm and appears normal.  Right Kidney:  Measures 10.0 cm and appears normal.  Left Kidney:  Measures 10.9 cm and appears normal.  Abdominal aorta:  No aneurysm identified.  IMPRESSION:  1.  No acute finding. 2.  Fatty infiltration of liver. 3.  Status post cholecystectomy.  Original Report Authenticated By: Bernadene Bell. Maricela Curet, M.D.    Labs  CBC  Lab 12/20/11 0749 12/19/11 1835  WBC 6.8 7.1  HGB 13.0 13.4  HCT 38.0 39.5  PLT 204 246   BMET  Lab 12/20/11 0749 12/19/11 1533  NA 141 138  K 4.0 3.4*  CL 105 98  CO2 26 30  BUN 4* 4*  CREATININE 0.54 0.51  CALCIUM 8.6 10.2  PROT 5.7* 6.6  BILITOT 0.3 0.4  ALKPHOS 204* 243*  ALT 18 23  AST 33 42*  GLUCOSE 94 78   Results for orders placed during the hospital encounter of 12/19/11 (  from the past 72 hour(s))  LIPASE, BLOOD     Status: Abnormal   Collection Time   12/19/11  3:33 PM      Component Value Range Comment   Lipase 111 (*) 11 - 59 (U/L)   COMPREHENSIVE METABOLIC PANEL     Status: Abnormal   Collection Time   12/19/11  3:33 PM      Component Value Range Comment   Sodium 138  135 - 145 (mEq/L)    Potassium 3.4 (*) 3.5 - 5.1 (mEq/L)    Chloride 98  96 - 112 (mEq/L)    CO2 30  19 - 32 (mEq/L)    Glucose, Bld 78  70 - 99 (mg/dL)    BUN 4  (*) 6 - 23 (mg/dL)    Creatinine, Ser 1.61  0.50 - 1.10 (mg/dL)    Calcium 09.6  8.4 - 10.5 (mg/dL)    Total Protein 6.6  6.0 - 8.3 (g/dL)    Albumin 3.4 (*) 3.5 - 5.2 (g/dL)    AST 42 (*) 0 - 37 (U/L)    ALT 23  0 - 35 (U/L)    Alkaline Phosphatase 243 (*) 39 - 117 (U/L)    Total Bilirubin 0.4  0.3 - 1.2 (mg/dL)    GFR calc non Af Amer >90  >90 (mL/min)    GFR calc Af Amer >90  >90 (mL/min)   URINALYSIS, ROUTINE W REFLEX MICROSCOPIC     Status: Abnormal   Collection Time   12/19/11  6:03 PM      Component Value Range Comment   Color, Urine YELLOW  YELLOW     APPearance CLEAR  CLEAR     Specific Gravity, Urine 1.004 (*) 1.005 - 1.030     pH 7.0  5.0 - 8.0     Glucose, UA NEGATIVE  NEGATIVE (mg/dL)    Hgb urine dipstick TRACE (*) NEGATIVE     Bilirubin Urine NEGATIVE  NEGATIVE     Ketones, ur NEGATIVE  NEGATIVE (mg/dL)    Protein, ur NEGATIVE  NEGATIVE (mg/dL)    Urobilinogen, UA 0.2  0.0 - 1.0 (mg/dL)    Nitrite NEGATIVE  NEGATIVE     Leukocytes, UA NEGATIVE  NEGATIVE    URINE MICROSCOPIC-ADD ON     Status: Abnormal   Collection Time   12/19/11  6:03 PM      Component Value Range Comment   Squamous Epithelial / LPF FEW (*) RARE     WBC, UA 0-2  <3 (WBC/hpf)    RBC / HPF 0-2  <3 (RBC/hpf)    Bacteria, UA RARE  RARE    CBC     Status: Normal   Collection Time   12/19/11  6:35 PM      Component Value Range Comment   WBC 7.1  4.0 - 10.5 (K/uL)    RBC 4.33  3.87 - 5.11 (MIL/uL)    Hemoglobin 13.4  12.0 - 15.0 (g/dL)    HCT 04.5  40.9 - 81.1 (%)    MCV 91.2  78.0 - 100.0 (fL)    MCH 30.9  26.0 - 34.0 (pg)    MCHC 33.9  30.0 - 36.0 (g/dL)    RDW 91.4  78.2 - 95.6 (%)    Platelets 246  150 - 400 (K/uL)   DIFFERENTIAL     Status: Abnormal   Collection Time   12/19/11  6:35 PM      Component Value Range Comment   Neutrophils  Relative 43  43 - 77 (%)    Neutro Abs 3.1  1.7 - 7.7 (K/uL)    Lymphocytes Relative 43  12 - 46 (%)    Lymphs Abs 3.0  0.7 - 4.0 (K/uL)    Monocytes  Relative 7  3 - 12 (%)    Monocytes Absolute 0.5  0.1 - 1.0 (K/uL)    Eosinophils Relative 7 (*) 0 - 5 (%)    Eosinophils Absolute 0.5  0.0 - 0.7 (K/uL)    Basophils Relative 0  0 - 1 (%)    Basophils Absolute 0.0  0.0 - 0.1 (K/uL)   LIPID PANEL     Status: Abnormal   Collection Time   12/19/11  7:54 PM      Component Value Range Comment   Cholesterol 180  0 - 200 (mg/dL)    Triglycerides 045  <150 (mg/dL)    HDL 50  >40 (mg/dL)    Total CHOL/HDL Ratio 3.6      VLDL 24  0 - 40 (mg/dL)    LDL Cholesterol 981 (*) 0 - 99 (mg/dL)   PREGNANCY, URINE     Status: Normal   Collection Time   12/20/11 12:08 AM      Component Value Range Comment   Preg Test, Ur NEGATIVE  NEGATIVE    COMPREHENSIVE METABOLIC PANEL     Status: Abnormal   Collection Time   12/20/11  7:49 AM      Component Value Range Comment   Sodium 141  135 - 145 (mEq/L)    Potassium 4.0  3.5 - 5.1 (mEq/L)    Chloride 105  96 - 112 (mEq/L)    CO2 26  19 - 32 (mEq/L)    Glucose, Bld 94  70 - 99 (mg/dL)    BUN 4 (*) 6 - 23 (mg/dL)    Creatinine, Ser 1.91  0.50 - 1.10 (mg/dL)    Calcium 8.6  8.4 - 10.5 (mg/dL)    Total Protein 5.7 (*) 6.0 - 8.3 (g/dL)    Albumin 2.7 (*) 3.5 - 5.2 (g/dL)    AST 33  0 - 37 (U/L)    ALT 18  0 - 35 (U/L)    Alkaline Phosphatase 204 (*) 39 - 117 (U/L)    Total Bilirubin 0.3  0.3 - 1.2 (mg/dL)    GFR calc non Af Amer >90  >90 (mL/min)    GFR calc Af Amer >90  >90 (mL/min)   CBC     Status: Normal   Collection Time   12/20/11  7:49 AM      Component Value Range Comment   WBC 6.8  4.0 - 10.5 (K/uL)    RBC 4.19  3.87 - 5.11 (MIL/uL)    Hemoglobin 13.0  12.0 - 15.0 (g/dL)    HCT 47.8  29.5 - 62.1 (%)    MCV 90.7  78.0 - 100.0 (fL)    MCH 31.0  26.0 - 34.0 (pg)    MCHC 34.2  30.0 - 36.0 (g/dL)    RDW 30.8  65.7 - 84.6 (%)    Platelets 204  150 - 400 (K/uL)   TSH     Status: Normal   Collection Time   12/20/11  7:49 AM      Component Value Range Comment   TSH <0.008  0.350 - 4.500 (uIU/mL)  CORRECTED REPORT CALLED TO K [facility] RN 12/21/11 1536 A BROWNING  T4, FREE     Status: Normal  Collection Time   12/21/11 10:34 AM      Component Value Range Comment   Free T4 1.63  0.80 - 1.80 (ng/dL)   T3, FREE     Status: Abnormal   Collection Time   12/21/11 10:34 AM      Component Value Range Comment   T3, Free 4.8 (*) 2.3 - 4.2 (pg/mL)   TSH     Status: Abnormal   Collection Time   12/21/11  3:48 PM      Component Value Range Comment   TSH <0.008 (*) 0.350 - 4.500 (uIU/mL)   LIPASE, BLOOD     Status: Abnormal   Collection Time   12/22/11  9:14 AM      Component Value Range Comment   Lipase 71 (*) 11 - 59 (U/L)    Discharge Exam General: Sitting up in bed. Comfortable. Pleasant.  HEENT: EOMI. Mouth Mucosa Moist. Eyes wide. Sclera visible above pupils. Thyroid smooth, mobile, without nodules. PULM: Normal WOB. CTAB.  CV: RRR. No murmurs rubs or gallops.  Abdomen: Soft. Mild tenderness epigastric region to moderate palpation. Mild distension. No masses. No guarding or rebound. Scars from lap chole and ventral hernia repair.  Extremities: No cyanosis, clubbing or edema.    Patient condition at time of discharge: stable Disposition: home   Follow up issues: 1. Hyperthyroidism: Patient should follow-up with Endocrinology outpatient to discuss results further and develop a plan for management.  2. Pancreatitis: Resolved. Patient should advance her diet as tolerated and may use Zofran for nausea.   Discharge follow up:  Follow-up Information    Schedule an appointment as soon as possible for a visit with AVBUERE,EDWIN A, MD.      Schedule an appointment as soon as possible for a visit with [facility].   Contact information:   [address] [place] [address] [phone]         Discharge Medications Medication List  As of 12/22/2011  2:48 PM   TAKE these medications         atenolol 50 MG tablet   Commonly known as: TENORMIN     Take 1 tablet (50 mg total) by mouth daily.      busPIRone 10 MG tablet   Commonly known as: BUSPAR   Take 10 mg by mouth 2 (two) times daily. upto three times a day      calcium-vitamin D 500-200 MG-UNIT per tablet   Commonly known as: OSCAL WITH D   Take 1 tablet by mouth daily.      cyclobenzaprine 10 MG tablet   Commonly known as: FLEXERIL   Take 10 mg by mouth 3 (three) times daily as needed. For muscle spasms.      diclofenac 75 MG EC tablet   Commonly known as: VOLTAREN   Take 75 mg by mouth daily.      diclofenac sodium 1 % Gel   Commonly known as: VOLTAREN   Apply 1 application topically 2 (two) times daily. Knees, hips, neck      escitalopram 20 MG tablet   Commonly known as: LEXAPRO   Take 30 mg by mouth daily.      lansoprazole 15 MG capsule   Commonly known as: PREVACID   Take 15 mg by mouth daily.      methadone 10 MG/5ML solution   Commonly known as: DOLOPHINE   Take 80 mg by mouth daily.      oxyCODONE-acetaminophen 7.5-325 MG per  tablet   Commonly known as: PERCOCET   Take 1 tablet by mouth every 6 (six) hours as needed for pain.      potassium chloride 10 MEQ tablet   Commonly known as: K-DUR,KLOR-CON   Take 10 mEq by mouth daily.            Beverly Milch, MS4 Family Practice Teaching Service 12/22/2011 2:25 PM  3:59 PM Edd Arbour MD 12/22/2011  I have seen and examined patient with Beverly Milch MS4. I agree with the above note and have edited it. General: Sitting up in bed. Comfortable. Pleasant.  HEENT: EOMI. Mouth Mucosa Moist. Eyes wide. Sclera visible above pupils. Thyroid smooth, mobile, without nodules. PULM: Normal WOB. CTAB.  CV: RRR. No murmurs rubs or gallops.  Abdomen: Soft. Mild tenderness epigastric region to moderate palpation. Mild distension. No masses. No guarding or rebound. Scars from lap chole and ventral hernia repair.  Extremities: No cyanosis, clubbing or edema.  Agree with above discharge  plans.  Edd Arbour MD

## 2011-12-25 ENCOUNTER — Other Ambulatory Visit: Payer: Self-pay | Admitting: Sports Medicine

## 2011-12-27 ENCOUNTER — Emergency Department (HOSPITAL_COMMUNITY)
Admission: EM | Admit: 2011-12-27 | Discharge: 2011-12-27 | Disposition: A | Discharge status: Home / Self Care | Payer: Medicare Other

## 2011-12-27 ENCOUNTER — Encounter (HOSPITAL_COMMUNITY): Payer: Self-pay | Admitting: Emergency Medicine

## 2011-12-27 DIAGNOSIS — E0500 Thyrotoxicosis with diffuse goiter without thyrotoxic crisis or storm: Secondary | ICD-10-CM | POA: Insufficient documentation

## 2011-12-27 HISTORY — DX: Thyrotoxicosis with diffuse goiter without thyrotoxic crisis or storm: E05.00

## 2011-12-27 NOTE — ED Notes (Signed)
Patient states that she was seen here last week for pancreatitis; patient complaining of upper left and right abdominal pain, nausea, and vomiting.  Last emesis at 1700 this evening.  Patient states that she has not been taking her full prescribed dose of phenergan because it "sedates" her.  Patient has had a chronic diagnosis of pancreatitis, along with a dysfunction of the sphincter of oddi.

## 2011-12-27 NOTE — ED Notes (Signed)
Patient left hospital AMA; explained risks and benefits of leaving AMA; patient verbalized understanding.

## 2011-12-29 ENCOUNTER — Encounter (HOSPITAL_COMMUNITY): Payer: Self-pay

## 2011-12-29 ENCOUNTER — Inpatient Hospital Stay (HOSPITAL_COMMUNITY)
Admission: EM | Admit: 2011-12-29 | Discharge: 2012-01-01 | DRG: 440 | Disposition: A | Discharge status: Home / Self Care | Payer: Medicare Other | Attending: Family Medicine | Admitting: Family Medicine

## 2011-12-29 ENCOUNTER — Emergency Department (HOSPITAL_COMMUNITY): Payer: Medicare Other

## 2011-12-29 DIAGNOSIS — F411 Generalized anxiety disorder: Secondary | ICD-10-CM | POA: Diagnosis present

## 2011-12-29 DIAGNOSIS — K859 Acute pancreatitis without necrosis or infection, unspecified: Principal | ICD-10-CM | POA: Diagnosis present

## 2011-12-29 DIAGNOSIS — E0500 Thyrotoxicosis with diffuse goiter without thyrotoxic crisis or storm: Secondary | ICD-10-CM | POA: Diagnosis present

## 2011-12-29 DIAGNOSIS — G35 Multiple sclerosis: Secondary | ICD-10-CM | POA: Diagnosis present

## 2011-12-29 DIAGNOSIS — G8929 Other chronic pain: Secondary | ICD-10-CM | POA: Diagnosis present

## 2011-12-29 LAB — DIFFERENTIAL
Basophils Relative: 1 % (ref 0–1)
Eosinophils Absolute: 0.2 10*3/uL (ref 0.0–0.7)
Neutrophils Relative %: 46 % (ref 43–77)

## 2011-12-29 LAB — COMPREHENSIVE METABOLIC PANEL
ALT: 21 U/L (ref 0–35)
Albumin: 3.1 g/dL — ABNORMAL LOW (ref 3.5–5.2)
Alkaline Phosphatase: 209 U/L — ABNORMAL HIGH (ref 39–117)
Potassium: 3 mEq/L — ABNORMAL LOW (ref 3.5–5.1)
Sodium: 137 mEq/L (ref 135–145)
Total Protein: 6.6 g/dL (ref 6.0–8.3)

## 2011-12-29 LAB — CBC
MCH: 31.3 pg (ref 26.0–34.0)
MCH: 30.8 pg (ref 26.0–34.0)
MCHC: 34.5 g/dL (ref 30.0–36.0)
MCV: 90.3 fL (ref 78.0–100.0)
Platelets: 225 10*3/uL (ref 150–400)
Platelets: 267 10*3/uL (ref 150–400)
RDW: 12.7 % (ref 11.5–15.5)

## 2011-12-29 LAB — LIPASE, BLOOD: Lipase: 99 U/L — ABNORMAL HIGH (ref 11–59)

## 2011-12-29 MED ORDER — ACETAMINOPHEN 650 MG RE SUPP: 650.0000 mg | Freq: Four times a day (QID) | RECTAL | Status: DC | PRN

## 2011-12-29 MED ORDER — SODIUM CHLORIDE 0.9 % IV BOLUS (SEPSIS)
500.0000 mL | Freq: Once | INTRAVENOUS | Status: AC
  Administered 2011-12-29: 500 mL via INTRAVENOUS

## 2011-12-29 MED ORDER — FENTANYL CITRATE 0.05 MG/ML IJ SOLN: 100.0000 ug | Freq: Once | INTRAMUSCULAR | Status: DC

## 2011-12-29 MED ORDER — ESCITALOPRAM OXALATE 20 MG PO TABS
30.0000 mg | ORAL_TABLET | Freq: Every day | ORAL | Status: DC
  Administered 2011-12-29 – 2012-01-01 (×4): 30 mg via ORAL
  Filled 2011-12-29 (×4): qty 1

## 2011-12-29 MED ORDER — NALOXONE HCL 0.4 MG/ML IJ SOLN: 0.4000 mg | INTRAMUSCULAR | Status: DC | PRN

## 2011-12-29 MED ORDER — HYDROMORPHONE HCL PF 1 MG/ML IJ SOLN
1.0000 mg | Freq: Once | INTRAMUSCULAR | Status: AC
  Administered 2011-12-29: 1 mg via INTRAVENOUS
  Filled 2011-12-29: qty 1

## 2011-12-29 MED ORDER — ALUM & MAG HYDROXIDE-SIMETH 200-200-20 MG/5ML PO SUSP
30.0000 mL | Freq: Four times a day (QID) | ORAL | Status: DC | PRN
  Administered 2011-12-30 – 2012-01-01 (×3): 30 mL via ORAL
  Filled 2011-12-29 (×3): qty 30

## 2011-12-29 MED ORDER — SODIUM CHLORIDE 0.9 % IJ SOLN: 9.0000 mL | INTRAMUSCULAR | Status: DC | PRN

## 2011-12-29 MED ORDER — DIPHENHYDRAMINE HCL 50 MG/ML IJ SOLN: 12.5000 mg | Freq: Four times a day (QID) | INTRAMUSCULAR | Status: DC | PRN

## 2011-12-29 MED ORDER — ONDANSETRON HCL 4 MG PO TABS
4.0000 mg | ORAL_TABLET | Freq: Four times a day (QID) | ORAL | Status: DC | PRN
  Administered 2012-01-01: 4 mg via ORAL
  Filled 2011-12-29: qty 1

## 2011-12-29 MED ORDER — HYDROMORPHONE 0.3 MG/ML IV SOLN
INTRAVENOUS | Status: AC
  Filled 2011-12-29: qty 25

## 2011-12-29 MED ORDER — ENOXAPARIN SODIUM 40 MG/0.4ML ~~LOC~~ SOLN
40.0000 mg | SUBCUTANEOUS | Status: DC
  Administered 2011-12-29 – 2011-12-31 (×3): 40 mg via SUBCUTANEOUS
  Filled 2011-12-29 (×4): qty 0.4

## 2011-12-29 MED ORDER — ONDANSETRON HCL 4 MG/2ML IJ SOLN
4.0000 mg | Freq: Four times a day (QID) | INTRAMUSCULAR | Status: DC | PRN
  Administered 2011-12-30 – 2011-12-31 (×4): 4 mg via INTRAVENOUS
  Filled 2011-12-29 (×6): qty 2

## 2011-12-29 MED ORDER — SODIUM CHLORIDE 0.9 % IV SOLN
INTRAVENOUS | Status: DC
  Administered 2011-12-29 – 2011-12-31 (×4): via INTRAVENOUS

## 2011-12-29 MED ORDER — DIPHENHYDRAMINE HCL 12.5 MG/5ML PO ELIX
12.5000 mg | ORAL_SOLUTION | Freq: Four times a day (QID) | ORAL | Status: DC | PRN
  Filled 2011-12-29: qty 5

## 2011-12-29 MED ORDER — POTASSIUM CHLORIDE CRYS ER 20 MEQ PO TBCR
80.0000 meq | EXTENDED_RELEASE_TABLET | Freq: Once | ORAL | Status: AC
  Administered 2011-12-29: 80 meq via ORAL
  Filled 2011-12-29: qty 4

## 2011-12-29 MED ORDER — ACETAMINOPHEN 325 MG PO TABS: 650.0000 mg | ORAL_TABLET | Freq: Four times a day (QID) | ORAL | Status: DC | PRN

## 2011-12-29 MED ORDER — ONDANSETRON HCL 4 MG/2ML IJ SOLN
4.0000 mg | Freq: Once | INTRAMUSCULAR | Status: AC
  Administered 2011-12-29: 4 mg via INTRAVENOUS
  Filled 2011-12-29: qty 2

## 2011-12-29 MED ORDER — BUSPIRONE HCL 10 MG PO TABS
10.0000 mg | ORAL_TABLET | Freq: Two times a day (BID) | ORAL | Status: DC
  Administered 2011-12-29 – 2012-01-01 (×6): 10 mg via ORAL
  Filled 2011-12-29 (×7): qty 1

## 2011-12-29 MED ORDER — ATENOLOL 50 MG PO TABS
50.0000 mg | ORAL_TABLET | Freq: Every day | ORAL | Status: DC
  Administered 2011-12-30 – 2012-01-01 (×3): 50 mg via ORAL
  Filled 2011-12-29 (×3): qty 1

## 2011-12-29 MED ORDER — METHADONE HCL 10 MG PO TABS
80.0000 mg | ORAL_TABLET | Freq: Every day | ORAL | Status: DC
  Administered 2011-12-30 – 2012-01-01 (×3): 80 mg via ORAL
  Filled 2011-12-29 (×4): qty 8

## 2011-12-29 MED ORDER — HYDROMORPHONE 0.3 MG/ML IV SOLN
INTRAVENOUS | Status: DC
  Administered 2011-12-29 – 2011-12-30 (×3): via INTRAVENOUS
  Administered 2011-12-30: 0.3 mg via INTRAVENOUS
  Administered 2011-12-30: 4.8 mg via INTRAVENOUS
  Administered 2011-12-30: 3.3 mg via INTRAVENOUS
  Administered 2011-12-30: 6.3 mg via INTRAVENOUS
  Administered 2011-12-30: 3.9 mg via INTRAVENOUS
  Administered 2011-12-31: 4.19 mg via INTRAVENOUS
  Administered 2011-12-31: 0.2 mg via INTRAVENOUS
  Filled 2011-12-29: qty 25

## 2011-12-29 MED ORDER — NICOTINE 14 MG/24HR TD PT24
14.0000 mg | MEDICATED_PATCH | Freq: Every day | TRANSDERMAL | Status: DC
  Administered 2011-12-29 – 2012-01-01 (×4): 14 mg via TRANSDERMAL
  Filled 2011-12-29 (×4): qty 1

## 2011-12-29 MED ORDER — ONDANSETRON HCL 4 MG/2ML IJ SOLN
4.0000 mg | Freq: Four times a day (QID) | INTRAMUSCULAR | Status: DC | PRN
  Administered 2011-12-30 (×2): 4 mg via INTRAVENOUS

## 2011-12-29 MED ORDER — PANTOPRAZOLE SODIUM 40 MG PO TBEC
40.0000 mg | DELAYED_RELEASE_TABLET | Freq: Every day | ORAL | Status: DC
  Administered 2011-12-29 – 2012-01-01 (×3): 40 mg via ORAL
  Filled 2011-12-29 (×4): qty 1

## 2011-12-29 MED ORDER — METHADONE HCL 10 MG/5ML PO SOLN: 80.0000 mg | Freq: Every day | ORAL | Status: DC

## 2011-12-29 NOTE — ED Notes (Signed)
IM MD at bedside. 

## 2011-12-29 NOTE — H&P (Addendum)
[name] is an 46 y.o. female.   Chief Complaint: abdominal pain HPI:  46 y/o c/f with abdominal pain, epigastric, boring/burning, radiates to back with colic type pain. Elevated Lipase 99, and alk phos. Hx of sphincter of Oddi dysfunction. Had gallstone pancreatitis back in '03. Had lap cholecystectomy. Had ERCP 4 years ago and had sphincterotomy with stent placement. She is followed by Dr. [name] at [facility] and Dr. [name] with GI here at [facility].  Patient was recently discharged on the 15th of feb. For pancreatitis.  She has a history of using too much of her pain medication and is now on 80 mg of methadone daily. This is not solely for pancreatitis, she also has chronic pain, and multiple sclerosis.  ROS:  Pertinent items are noted in HPI. No fever, chills, night sweats, weight loss.  Labs: K: 3.0 Lipase     Component Value Date/Time   LIPASE 99* 12/29/2011 1752      U/A negative Past Medical History  Diagnosis Date  . Multiple sclerosis   . Irritable bowel syndrome   . Arthritis   . Pancreatitis   . Anxiety   . Depression   . Grave's disease     Past Surgical History  Procedure Date  . Sphincterotomy     Family History  Problem Relation Age of Onset  . Cancer Mother   . Hypertension Father    Social History:  reports that she has been smoking.  She has never used smokeless tobacco. She reports that she does not drink alcohol or use illicit drugs.  Allergies:  Allergies  Allergen Reactions  . Erythromycin Nausea And Vomiting  . Morphine And Related Other (See Comments)    IV whelps on arms.    Medications Prior to Admission  Medication Dose Route Frequency Provider Last Rate Last Dose  . 0.9 %  sodium chloride infusion   Intravenous Continuous Juliet Rude. Pickering, MD      . diphenhydrAMINE (BENADRYL) injection 12.5 mg  12.5 mg Intravenous Q6H PRN Edd Arbour, MD       Or  . diphenhydrAMINE (BENADRYL) 12.5 MG/5ML elixir 12.5 mg  12.5 mg Oral Q6H PRN Edd Arbour, MD      . HYDROmorphone (DILAUDID) injection 1 mg  1 mg Intravenous Once Harrold Donath R. Pickering, MD   1 mg at 12/29/11 1938  . HYDROmorphone (DILAUDID) PCA injection 0.3 mg/mL   Intravenous Q4H Edd Arbour, MD      . naloxone [facility]) injection 0.4 mg  0.4 mg Intravenous PRN Edd Arbour, MD       And  . sodium chloride 0.9 % injection 9 mL  9 mL Intravenous PRN Edd Arbour, MD      . ondansetron [facility]) injection 4 mg  4 mg Intravenous Once Harrold Donath R. Rubin Payor, MD   4 mg at 12/29/11 1936  . ondansetron (ZOFRAN) injection 4 mg  4 mg Intravenous Q6H PRN Edd Arbour, MD      . sodium chloride 0.9 % bolus 500 mL  500 mL Intravenous Once Harrold Donath R. Pickering, MD   500 mL at 12/29/11 1935  . DISCONTD: fentaNYL (SUBLIMAZE) injection 100 mcg  100 mcg Intravenous Once [employer]. Rubin Payor, MD       Medications Prior to Admission  Medication Sig Dispense Refill  . atenolol (TENORMIN) 50 MG tablet Take 50 mg by mouth daily.      . busPIRone (BUSPAR) 10 MG tablet Take 10 mg by mouth 2 (two) times daily. upto  three times a day      . cyclobenzaprine (FLEXERIL) 10 MG tablet Take 10 mg by mouth 3 (three) times daily as needed. For muscle spasms.      . diclofenac (VOLTAREN) 75 MG EC tablet Take 75 mg by mouth daily.       . diclofenac sodium (VOLTAREN) 1 % GEL Apply 1 application topically 2 (two) times daily. Knees, hips, neck      . escitalopram (LEXAPRO) 20 MG tablet Take 30 mg by mouth daily.       . lansoprazole (PREVACID) 15 MG capsule Take 15 mg by mouth daily.      . methadone (DOLOPHINE) 10 MG/5ML solution Take 80 mg by mouth daily.      . potassium chloride (K-DUR,KLOR-CON) 10 MEQ tablet Take 10 mEq by mouth daily.      . promethazine (PHENERGAN) 25 MG tablet Take 25 mg by mouth every 6 (six) hours as needed. For nausea      Blood pressure 108/64, pulse 92, temperature 97.6 F (36.4 C), temperature source Oral, resp. rate 16, height 5\' 4"  (1.626 m), weight 155 lb (70.308 kg), last  menstrual period 04/28/2011, SpO2 97.00%. Physical Exam  General: C/F supine, in mild discomfort, a0x3. Friendly. Eye - she almost has proptosis, Pupils Equal Round Reactive to light, Extraocular movements intact Lungs:  Normal respiratory effort, chest expands symmetrically. Lungs are clear to auscultation, no crackles or wheezes. Heart - Regular rate and rhythm.  No murmurs, gallops or rubs.    Abdomen: Firm, but not rigid, tender epigastric region, no masses, organomegaly or hernias noted.  No involuntary guarding or rebound. Scars from lap chole and ventral hernia repair. Stretch marks. Extremities:  No cyanosis, edema, or deformity noted with good range of motion of all major joints.   Skin:  Intact without suspicious lesions or rashes Mouth - no lesions, mucous membranes are moist, no decaying teeth   Assessment/Plan 46 y/o female with pancreatitis and hx of sphincter of Oddi dysfunction.  1. Pancreatitis Her Lipase has been in the 100's in past flares. Denies alcohol use, no culpable meds. - NS @ 125 - continue methadone 80, PCA dilaudid for breakthrough (low dose) - liquid diet  2. Multiple sclerosis - chronic disease without active component at this time  3. Chronic pain Methadone 80 mg daily and PCA dilaudid for breakthrough pain  4. FENGI: protonix, NPO, IVF NS, Nicotine patch. (smokes half a pack daily)  5. Proph: Lovenox q 24 hrs.  6. Dispo: inpatient for workup and pain control. Possibly home tomorrow pending clinical improvement.  7. Grave's disease Patient has follow up with [facility] endocrinology. She is currently taking Atenolol for her symptoms. She has tremors, slight exophthalmus, weight loss.  Edd Arbour MD 12/29/2011, 7:51 PM

## 2011-12-29 NOTE — ED Provider Notes (Signed)
History     CSN: [id number]  Arrival date & time 12/29/11  1612   First MD Initiated Contact with Patient 12/29/11 1728      Chief Complaint  Patient presents with  . Abdominal Pain    (Consider location/radiation/quality/duration/timing/severity/associated sxs/prior treatment) Patient is a 46 y.o. female presenting with abdominal pain. The history is provided by the patient.  Abdominal Pain The primary symptoms of the illness include abdominal pain, nausea and vomiting. The primary symptoms of the illness do not include shortness of breath or diarrhea.  Symptoms associated with the illness do not include back pain.   patient has a history of chronic abdominal pain and pancreatitis. She's had from a dysfunctional sphincter of ODi. She was recently admitted for pancreatitis. She's discharged home in a been doing better, but then she developed nausea vomiting and the pain again. No fevers. She states she's been unable to the pain. No diarrhea. No blood in the emesis  Past Medical History  Diagnosis Date  . Multiple sclerosis   . Irritable bowel syndrome   . Arthritis   . Pancreatitis   . Anxiety   . Depression   . Grave's disease     Past Surgical History  Procedure Date  . Sphincterotomy     Family History  Problem Relation Age of Onset  . Cancer Mother   . Hypertension Father     History  Substance Use Topics  . Smoking status: Current Everyday Smoker -- 0.5 packs/day for 8 years  . Smokeless tobacco: Never Used  . Alcohol Use: No    OB History    Grav Para Term Preterm Abortions TAB SAB Ect Mult Living                  Review of Systems  Constitutional: Negative for activity change and appetite change.  HENT: Negative for neck stiffness.   Eyes: Negative for pain.  Respiratory: Negative for chest tightness and shortness of breath.   Cardiovascular: Negative for chest pain and leg swelling.  Gastrointestinal: Positive for nausea, vomiting and abdominal  pain. Negative for diarrhea.  Genitourinary: Negative for flank pain.  Musculoskeletal: Negative for back pain.  Skin: Negative for rash.  Neurological: Negative for weakness, numbness and headaches.  Psychiatric/Behavioral: Negative for behavioral problems.    Allergies  Erythromycin and Morphine and related  Home Medications   Current Outpatient Rx  Name Route Sig Dispense Refill  . ATENOLOL 50 MG PO TABS Oral Take 50 mg by mouth daily.    . BUSPIRONE HCL 10 MG PO TABS Oral Take 10 mg by mouth 2 (two) times daily. upto three times a day    . CYCLOBENZAPRINE HCL 10 MG PO TABS Oral Take 10 mg by mouth 3 (three) times daily as needed. For muscle spasms.    . DICLOFENAC SODIUM 75 MG PO TBEC Oral Take 75 mg by mouth daily.     Marland Kitchen DICLOFENAC SODIUM 1 % TD GEL Topical Apply 1 application topically 2 (two) times daily. Knees, hips, neck    . ESCITALOPRAM OXALATE 20 MG PO TABS Oral Take 30 mg by mouth daily.     Marland Kitchen LANSOPRAZOLE 15 MG PO CPDR Oral Take 15 mg by mouth daily.    Marland Kitchen METHADONE HCL 10 MG/5ML PO SOLN Oral Take 80 mg by mouth daily.    Marland Kitchen POTASSIUM CHLORIDE CRYS ER 10 MEQ PO TBCR Oral Take 10 mEq by mouth daily.    Marland Kitchen PROMETHAZINE HCL 25 MG PO  TABS Oral Take 25 mg by mouth every 6 (six) hours as needed. For nausea      BP 123/86  Pulse 99  Temp 98.6 F (37 C)  Resp 18  Ht 5\' 4"  (1.626 m)  Wt 155 lb (70.308 kg)  BMI 26.61 kg/m2  SpO2 99%  LMP 04/28/2011  Physical Exam  Nursing note and vitals reviewed. Constitutional: She is oriented to person, place, and time. She appears well-developed and well-nourished.  HENT:  Head: Normocephalic and atraumatic.  Eyes: EOM are normal. Pupils are equal, round, and reactive to light.  Neck: Normal range of motion. Neck supple.  Cardiovascular: Normal rate, regular rhythm and normal heart sounds.   No murmur heard. Pulmonary/Chest: Effort normal and breath sounds normal. No respiratory distress. She has no wheezes. She has no rales.    Abdominal: Soft. Bowel sounds are normal. She exhibits no distension. There is tenderness. There is guarding. There is no rebound.       Moderate tenderness with guarding in upper abdomen  Musculoskeletal: Normal range of motion.  Neurological: She is alert and oriented to person, place, and time. No cranial nerve deficit.  Skin: Skin is warm and dry.  Psychiatric: She has a normal mood and affect. Her speech is normal.    ED Course  Procedures (including critical care time)  Labs Reviewed  COMPREHENSIVE METABOLIC PANEL - Abnormal; Notable for the following:    Potassium 3.0 (*)    BUN 3 (*)    Albumin 3.1 (*)    AST 38 (*)    Alkaline Phosphatase 209 (*)    Total Bilirubin 0.2 (*)    All other components within normal limits  LIPASE, BLOOD - Abnormal; Notable for the following:    Lipase 99 (*)    All other components within normal limits  CBC  DIFFERENTIAL   Dg Abd Acute W/chest  12/29/2011  *RADIOLOGY REPORT*  Clinical Data: 46 year old female with mid abdominal pain, nausea and vomiting.  History pancreatitis.  ACUTE ABDOMEN SERIES (ABDOMEN 2 VIEW & CHEST 1 VIEW)  Comparison: 04/03/2010.  Findings: Lower lung volumes.  No pneumothorax or pneumoperitoneum. Aside from mild crowding lung markings, no acute pulmonary findings.  Nonobstructed bowel gas pattern.  Right upper quadrant surgical clips.  Abdominal and pelvic visceral contours are within normal limits. No acute osseous abnormality identified.  IMPRESSION: 1. Nonobstructed bowel gas pattern, no free air. 2. No acute cardiopulmonary abnormality.  Original Report Authenticated By: Ulla Potash III, M.D.     1. Pancreatitis       MDM  Acute on chronic upper abdominal pain. Her lipase was started to increase again. Likely recurrence or pancreatitis. She'll be admitted        Juliet Rude. Rubin Payor, MD 12/29/11 1910

## 2011-12-29 NOTE — ED Notes (Signed)
Recently admitted and discharged for pancreatitis Went home over the weekend, and was doing better, but developed n/v and pain again,

## 2011-12-29 NOTE — ED Notes (Signed)
[id number] Ready 

## 2011-12-29 NOTE — ED Provider Notes (Deleted)
  Physical Exam  BP 123/86  Pulse 99  Temp 98.6 F (37 C)  Resp 18  Ht 5\' 4"  (1.626 m)  Wt 155 lb (70.308 kg)  BMI 26.61 kg/m2  SpO2 99%  LMP 04/28/2011  Physical Exam  ED Course  Procedures  MDM Epigastric pain nausea vomiting. Recently treated for acute on chronic pancreatitis. 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 She states the pain returned. Lab work will be checked      [employer]. Rubin Payor, MD 12/29/11 1755

## 2011-12-30 DIAGNOSIS — K859 Acute pancreatitis without necrosis or infection, unspecified: Secondary | ICD-10-CM

## 2011-12-30 LAB — RAPID URINE DRUG SCREEN, HOSP PERFORMED
Amphetamines: NOT DETECTED
Barbiturates: NOT DETECTED
Benzodiazepines: NOT DETECTED
Tetrahydrocannabinol: NOT DETECTED

## 2011-12-30 LAB — COMPREHENSIVE METABOLIC PANEL
ALT: 18 U/L (ref 0–35)
CO2: 28 mEq/L (ref 19–32)
Calcium: 9.1 mg/dL (ref 8.4–10.5)
Creatinine, Ser: 0.5 mg/dL (ref 0.50–1.10)
GFR calc Af Amer: >90 mL/min (ref >90)
GFR calc non Af Amer: >90 mL/min (ref >90)
Glucose, Bld: 87 mg/dL (ref 70–99)
Total Bilirubin: 0.2 mg/dL — ABNORMAL LOW (ref 0.3–1.2)

## 2011-12-30 LAB — LIPID PANEL
Cholesterol: 104 mg/dL (ref 0–200)
Triglycerides: 189 mg/dL — ABNORMAL HIGH (ref <150)
VLDL: 38 mg/dL (ref 0–40)

## 2011-12-30 LAB — CBC
Hemoglobin: 12.5 g/dL (ref 12.0–15.0)
MCHC: 34.3 g/dL (ref 30.0–36.0)
Platelets: 249 10*3/uL (ref 150–400)

## 2011-12-30 MED ORDER — HYDROMORPHONE 0.3 MG/ML IV SOLN
INTRAVENOUS | Status: AC
  Filled 2011-12-30: qty 25

## 2011-12-30 NOTE — H&P (Signed)
Family Medicine Teaching Service Attending Note  I interviewed and examined patient Mccranie and reviewed their tests and x-rays.  I discussed with Dr. Rivka Safer and reviewed their note for today.  I agree with their assessment and plan.     Additionally  Seems consistent with a pancreatitis flare but difficult to assess and treat in face of chronic methadone use.  Treat pain and limit diet.  As able to tolerate more diet decrease her analagesics.  Watch closely for oversedation.

## 2011-12-30 NOTE — Progress Notes (Signed)
Wasted 0.09 mg/ 3mL of dilaudid from PCA pump with Macarthur Critchley, RN.  Erikson Danzy, Joan Mayans, RN

## 2011-12-30 NOTE — Progress Notes (Signed)
PGY-1 Daily Progress Note Family Medicine Teaching Service D. Piloto Rolene Arbour, MD Service Pager: [phone]  Patient name: [name]  Medical record [id number] Date of birth:[birth year] Age: 46 y.o. Gender: female  LOS: 1 day   Subjective: feeling better. Pain is controlled. Has had nausea controlled with zofran and wants diet back to clears since she tried full  liquid diet  and was nauseated.  Objective:  Vitals: Temp:   98.6 F (37 C) Pulse Rate: 90  Resp:   18   BP:  108/71 mmHg  SpO2:94 %  Weight:  149 lb 14.6 oz   Physical Exam: Gen:  NAD but with facial expression of anxiety. HEENT: Moist mucous membranes CV: Regular rate and rhythm, systolic murmurs II/VI on precordium. PULM: Clear to auscultation bilaterally. No wheezes/rales/rhonchi ABD: Soft, mildly  Tender on epigastrium, no rebound tenderness.  Non distended, normal bowel sounds EXT: No edema Neuro: Alert and oriented x3. No focalization   Labs and imaging:  CBC  Lab 12/30/11 0630 12/29/11 2121 12/29/11 1752  WBC 7.0 9.1 7.8  HGB 12.5 12.4 14.4  HCT 36.4 36.3 41.7  PLT 249 267 225   BMET  Lab 12/30/11 0630 12/29/11 1752  NA 142 137  K 4.1 3.0*  CL 107 98  CO2 28 26  BUN <3* 3*  CREATININE 0.50 0.52  LABGLOM -- --  GLUCOSE 87 --  CALCIUM 9.1 9.5   Results for orders placed during the hospital encounter of 12/29/11 (from the past 24 hour(s))  COMPREHENSIVE METABOLIC PANEL     Status: Abnormal   Albumin 3.1 (*) 3.5 - 5.2 (g/dL)   AST 38 (*) 0 - 37 (U/L)   ALT 21  0 - 35 (U/L)   Alkaline Phosphatase 209 (*) 39 - 117 (U/L)   Total Bilirubin 0.2 (*) 0.3 - 1.2 (mg/dL)  LIPASE, BLOOD     Status: Abnormal      Component Value Range   Lipase 99 (*) 11 - 59 (U/L)  LIPID PANEL     Status: Abnormal   Collection Time   12/30/11  6:30 AM      Component Value Range   Cholesterol 104  0 - 200 (mg/dL)   Triglycerides 562 (*) <150 (mg/dL)   HDL 29 (*) >13 (mg/dL)   Total CHOL/HDL Ratio 3.6     VLDL  38  0 - 40 (mg/dL)   LDL Cholesterol 37  0 - 99 (mg/dL)   Dg Abd Acute W/chest  12/29/2011 .  IMPRESSION: 1. Nonobstructed bowel gas pattern, no free air. 2. No acute cardiopulmonary abnormality.  Original Report Authenticated By: Harley Hallmark, M.D.   Medications: Medication Dose Route Frequency  . 0.9 %  sodium chloride infusion   Intravenous Continuous  . acetaminophen (TYLENOL) tablet 650 mg  650 mg Oral Q6H PRN    . acetaminophen (TYLENOL) suppository 650 mg  650 mg Rectal Q6H PRN  . alum & mag hydroxide-simeth (MAALOX/MYLANTA) 200-200-20 MG/5ML suspension 30 mL  30 mL Oral Q6H PRN  . atenolol (TENORMIN) tablet 50 mg  50 mg Oral Daily  . busPIRone (BUSPAR) tablet 10 mg  10 mg Oral BID  . diphenhydrAMINE (BENADRYL) injection 12.5 mg  12.5 mg Intravenous Q6H PRN    . enoxaparin (LOVENOX) injection 40 mg  40 mg Subcutaneous Q24H  . escitalopram (LEXAPRO) 30 mg  30 mg Oral Daily  . HYDROmorphone PCA 0.3 mg/mL (DILAUDID) 0.3 mg/mL infusion      . methadone (  DOLOPHINE) tablet 80 mg  80 mg Oral Daily  . naloxone (NARCAN) injection 0.4 mg  0.4 mg Intravenous PRN  . sodium chloride 0.9 % injection 9 mL  9 mL Intravenous PRN  . nicotine (NICODERM CQ - dosed in mg/24 hours) patch 14 mg  14 mg Transdermal Daily  . ondansetron (ZOFRAN) tablet 4 mg  4 mg Oral Q6H PRN    . pantoprazole (PROTONIX) EC tablet 40 mg  40 mg Oral Q1200  . potassium chloride SA (K-DUR,KLOR-CON) CR tablet 80 mEq  80 mEq Oral Once  . sodium chloride 0.9 % bolus 500 mL  500 mL Intravenous Once   Assessment and Plan: 46 y/o female with pancreatitis and hx of sphincter of Oddi dysfunction.  1. Pancreatitis  Her Lipase has been in the 100's. 99 at this time.  Denies alcohol use, no culpable meds.  - NS @ 125  - continue methadone 80 and  PCA dilaudid for breakthrough (low dose)  - liquid diet  - UDS  2. Chronic pain management on methadone clinic([facility].Phone # [phone])     3. Multiple sclerosis  - chronic  disease without active component at this time   4. Graves Disease: Last TSH checked was a week ago with <0.008 and free T4 on 1.63 Thyroid scan uptake on Feb 15/2013: Findings consistent with Graves' disease. 24 hour uptake was  59.5%.   FEN/GI:  IVF NS, clear diet. Proph: Lovenox q 24 hrs. Protonix Disposition: Pending clinical improvement.  D. Piloto Rolene Arbour, MD PGY1, [facility] Medicine Teaching Service Pager [phone] 12/30/2011

## 2011-12-31 MED ORDER — HYDROMORPHONE HCL PF 1 MG/ML IJ SOLN
0.5000 mg | INTRAMUSCULAR | Status: DC | PRN
  Administered 2011-12-31 (×2): 0.5 mg via INTRAVENOUS
  Filled 2011-12-31 (×3): qty 1

## 2011-12-31 MED ORDER — HYDROCODONE-ACETAMINOPHEN 5-325 MG PO TABS
1.0000 | ORAL_TABLET | ORAL | Status: DC | PRN
  Administered 2011-12-31 – 2012-01-01 (×5): 2 via ORAL
  Filled 2011-12-31 (×6): qty 2

## 2011-12-31 MED ORDER — HYDROMORPHONE 0.3 MG/ML IV SOLN
INTRAVENOUS | Status: AC
  Filled 2011-12-31: qty 25

## 2011-12-31 NOTE — Progress Notes (Signed)
PGY-1 Daily Progress Note Family Medicine Teaching Service D. Piloto Rolene Arbour, MD Service Pager: [phone]  Patient name: [name]  Medical record [id number] Date of birth:Nov 19, 1965 Age: 46 y.o. Gender: female  LOS: 2 days   Subjective: Pain well controlled. Still with some nausea. Tolerated grits this AM. PCP Dr. Denny Peon at [facility] medical is aware of patient's new diagnosis of Grave's diease and will refer to [facility] endocrinology.    Objective:  Physical Exam: BP 105/70  Pulse 86  Temp(Src) 98 F (36.7 C) (Oral)  Resp 18  Ht 5\' 4"  (1.626 m)  Wt 149 lb 14.6 oz (68 kg)  BMI 25.73 kg/m2  SpO2 97%  LMP 04/28/2011 Gen:  NAD  Sitting on side of bed putting on lotion.  HEENT: Moist mucous membranes CV: Regular rate and rhythm, systolic murmurs II/VI on precordium. PULM: Clear to auscultation bilaterally. No wheezes/rales/rhonchi ABD:  Normal bowel sounds. Soft, full. Voluntary guarding. No rebound tenderness.  EXT: No edema Neuro: Alert and oriented x3. No focalization   Labs and imaging:  CBC Lab Results  Component Value Date   WBC 7.0 12/30/2011   HGB 12.5 12/30/2011   HCT 36.4 12/30/2011   MCV 90.5 12/30/2011   PLT 249 12/30/2011   BMET    Component Value Date/Time   NA 142 12/30/2011 0630   K 4.1 12/30/2011 0630   CL 107 12/30/2011 0630   CO2 28 12/30/2011 0630   GLUCOSE 87 12/30/2011 0630   BUN <3* 12/30/2011 0630   CREATININE 0.50 12/30/2011 0630   CALCIUM 9.1 12/30/2011 0630   GFRNONAA >90 12/30/2011 0630   GFRAA >90 12/30/2011 0630      Results for orders placed during the hospital encounter of 12/29/11 (from the past 24 hour(s))  COMPREHENSIVE METABOLIC PANEL     Status: Abnormal   Albumin 3.1 (*) 3.5 - 5.2 (g/dL)   AST 38 (*) 0 - 37 (U/L)   ALT 21  0 - 35 (U/L)   Alkaline Phosphatase 209 (*) 39 - 117 (U/L)   Total Bilirubin 0.2 (*) 0.3 - 1.2 (mg/dL)  LIPASE, BLOOD     Status: Abnormal      Component Value Range   Lipase 99 (*) 11 - 59 (U/L)  LIPID PANEL      Status: Abnormal   Collection Time   12/30/11  6:30 AM      Component Value Range   Cholesterol 104  0 - 200 (mg/dL)   Triglycerides 562 (*) <150 (mg/dL)   HDL 29 (*) >13 (mg/dL)   Total CHOL/HDL Ratio 3.6     VLDL 38  0 - 40 (mg/dL)   LDL Cholesterol 37  0 - 99 (mg/dL)   Dg Abd Acute W/chest  12/29/2011 .  IMPRESSION: 1. Nonobstructed bowel gas pattern, no free air. 2. No acute cardiopulmonary abnormality.  Original Report Authenticated By: Harley Hallmark, M.D.   Medications: Reviewed all scheduled and prn medications.   Assessment and Plan: 46 y/o female with pancreatitis and hx of sphincter of Oddi dysfunction. As well as chronic hip and thigh pain on methadone.   1. Pancreatitis  Her Lipase has been in the 100's. 99 at this time.  Denies alcohol use, no culpable meds.  - NS @ 125  - continue methadone 80 and  Vicodin, low dose IV prn dilaudid for breakthrough pain - advance diet as tolerated.   2. 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 Multiple  sclerosis  - chronic disease without active component at this time   4. Graves Disease: Last TSH checked was a week ago with <0.008 and free T4 on 1.63 Thyroid scan uptake on Feb 15/2013: Findings consistent with Graves' disease. 24 hour uptake was  59.5%.   5. FEN/GI: normal electrolytes. Advance diet at tolerated. continue prn Zofran. D/C IVF.     6. Prophylaxis DVT: : Lovenox q 24 hrs.  GI: Protonix  7. Disposition: To home tomorrow pending patient requires little to no IV pain medicine.   Trellis Moment, Family Medicine Teaching Service Pager [phone] 12/31/2011

## 2011-12-31 NOTE — Progress Notes (Signed)
Family Medicine Teaching Service Attending Note  I interviewed and examined patient [name] and reviewed their tests and x-rays.  I discussed with Dr. Armen Pickup and reviewed their note for today.  I agree with their assessment and plan.     Additionally  Improved.  Wean pain medications and advance diet.

## 2011-12-31 NOTE — Progress Notes (Signed)
Family Medicine Teaching Service Attending Note  On 2-23 I interviewed and examined patient [name] and reviewed their tests and x-rays.  I discussed with Dr. Aviva Signs and reviewed their note for today.  I agree with their assessment and plan.     Additionally  Adjust pain medications down as increase diet

## 2012-01-01 NOTE — Discharge Summary (Signed)
I discussed with Dr Piloto.  I agree with their plans documented in their  Note for today.  

## 2012-01-01 NOTE — Discharge Instructions (Signed)
Acute Pancreatitis Acute pancreatitis is the sudden irritation (inflammation) of the pancreas. The pancreas produces digestive juices or enzymes that break down food. The pancreas also makes 2 hormones that control your blood sugar. If the pancreas is irritated, the enzymes attack internal structures and tissues become damaged. When this happens, the pancreas fails to make new enzymes that break down food. HOME CARE  Eat small meals often. Avoid fatty, greasy, and fried foods.   Follow diet instructions given to you by your doctor.   Avoid foods or drinks that may have started the problem (like alcohol).   Drink enough fluids to keep your pee (urine) clear or pale yellow.   Only take medicine as told by your doctor.   Rest often.   Check your blood sugar at home if you are told to.   Keep all your follow-up visits. This is very important.  GET HELP RIGHT AWAY IF:   You do not get better in the time your doctor said you would.   You have pain, weakness, or feel sick to your stomach (nauseous).   You recover but then have another episode of pain.   You are unable to eat or keep liquids down.   Your pain gets worse or changes.   You have a temperature by mouth above 102 F (38.9 C), not controlled by medicine.   Your skin or the white part of your eyes look yellow.   You start to throw up (vomit).   You feel dizzy or pass out (faint).   Your blood sugar is high (over 300).  MAKE SURE YOU:   Understand these instructions.   Will watch your condition.   Will get help right away if you are not doing well or get worse.  Document Released: 04/10/2008 Document Revised: 07/05/2011 Document Reviewed: 04/10/2008 [facility] Patient Information 2012 [place], [place].

## 2012-01-01 NOTE — Discharge Summary (Signed)
Physician Discharge Summary  Patient ID: [name] [id number] [birth year] 46 y.o.  Admit date: 12/29/2011 Discharge date: 01/01/2012  PCP: [name], MD, MD   Discharge Diagnosis: 1. Mild acute pancreatitis 2. Hx of Oddy Sphincter dysfunction 3. Hyperthyroidism. 4. Chronic pain on methadone clinic (hx of narcotic abuse) 5. Hx Multiple Sclerosis.   Discharge Medications  Alicia, [name]  Home Medication Instructions [id number]   Printed on:01/01/12 1309  Medication Information                    busPIRone (BUSPAR) 10 MG tablet Take 10 mg by mouth 2 (two) times daily. upto three times a day           diclofenac (VOLTAREN) 75 MG EC tablet Take 75 mg by mouth daily.            cyclobenzaprine (FLEXERIL) 10 MG tablet Take 10 mg by mouth 3 (three) times daily as needed. For muscle spasms.           escitalopram (LEXAPRO) 20 MG tablet Take 30 mg by mouth daily.            lansoprazole (PREVACID) 15 MG capsule Take 15 mg by mouth daily.           diclofenac sodium (VOLTAREN) 1 % GEL Apply 1 application topically 2 (two) times daily. Knees, hips, neck           potassium chloride (K-DUR,KLOR-CON) 10 MEQ tablet Take 10 mEq by mouth daily.           methadone (DOLOPHINE) 10 MG/5ML solution Take 80 mg by mouth daily.           promethazine (PHENERGAN) 25 MG tablet Take 25 mg by mouth every 6 (six) hours as needed. For nausea           atenolol (TENORMIN) 50 MG tablet Take 50 mg by mouth daily.              Consults:None   Labs:      Component Value   Lipase 99 (*)      Component Value   Sodium 142    Potassium 4.1    Chloride 107    CO2 28    Glucose, Bld 87    BUN <3 (*)   Creatinine, Ser 0.50    Calcium 9.1    Total Protein 5.8 (*)   Albumin 2.7 (*)   AST 39 (*)   ALT 18    Alkaline Phosphatase 185 (*)   Total Bilirubin 0.2 (*)   GFR calc non Af Amer >90    GFR calc Af Amer >90           Component Value   WBC 7.0    RBC 4.02    Hemoglobin 12.5    HCT 36.4    MCV 90.5    MCH 31.1    MCHC 34.3    RDW 12.8    Platelets 249           Component Value   Cholesterol 104    Triglycerides 189 (*)   HDL 29 (*)   Total CHOL/HDL Ratio 3.6    VLDL 38    LDL Cholesterol 37           Component Value   Opiates NONE DETECTED    Cocaine NONE DETECTED    Benzodiazepines NONE DETECTED    Amphetamines NONE DETECTED    Tetrahydrocannabinol NONE DETECTED  Barbiturates NONE DETECTED     Procedures/Imaging:   12/29/2011  MPRESSION: 1. Nonobstructed bowel gas pattern, no free air. 2. No acute cardiopulmonary abnormality.  Original Report Authenticated By: Harley Hallmark, M.D.   Discharge Exam: Gen: NAD .  HEENT: Moist mucous membranes. Neck thyroid mildly generalized enlargement. No nodules on palpation. CV: Regular rate and rhythm, systolic murmurs II/VI on precordium.  PULM: Clear to auscultation bilaterally. No wheezes/rales/rhonchi  ABD: Soft,  No tender, no rebound. Non distended, normal bowel sounds  EXT: No edema  Neuro: Alert and oriented x3. No focalization   Brief Hospital Course:  46 y/o female with hx of sphincter of Oddi dysfunction admitted for mild acute pancreatitis. 1. Pancreatitis  Her Lipase has been in the 100's. 99 at this hopitalization. Denies alcohol use, was treated with fluids and pain management. Pt recover to normal food intake and resolution of symptoms.  2. 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  3. Multiple sclerosis - Chronic disease without active component on this hospitalization.  4. Hyperthyroidism: possible Graves Disease: Last TSH checked was a week ago with <0.008 and free T4 on 1.63  Thyroid scan uptake on Feb 15/2013: Findings consistent with Graves' disease. 24 hour uptake was  59.5%. Continue care as outpatient.  Patient condition at time of discharge/disposition:  Patient is discharge home on stable  medical condition.   Discharge follow up:  1.Dr Fleet Contras. Make an appointment in 7-10 days to f/u hospitalization. 2. Methadone clininc. 3. Endocrinology to f/u Iodine uptake and evaluate for therapy.  D. Piloto Rolene Arbour, MD  [facility] 01/01/2012

## 2012-01-01 NOTE — Progress Notes (Signed)
[name] to be D/C'd Home per MD order.  Discussed with the patient and all questions fully answered.   [name], [name]  Home Medication Instructions [id number]   Printed on:01/01/12 1510  Medication Information                    busPIRone (BUSPAR) 10 MG tablet Take 10 mg by mouth 2 (two) times daily. upto three times a day           diclofenac (VOLTAREN) 75 MG EC tablet Take 75 mg by mouth daily.            cyclobenzaprine (FLEXERIL) 10 MG tablet Take 10 mg by mouth 3 (three) times daily as needed. For muscle spasms.           escitalopram (LEXAPRO) 20 MG tablet Take 30 mg by mouth daily.            lansoprazole (PREVACID) 15 MG capsule Take 15 mg by mouth daily.           diclofenac sodium (VOLTAREN) 1 % GEL Apply 1 application topically 2 (two) times daily. Knees, hips, neck           potassium chloride (K-DUR,KLOR-CON) 10 MEQ tablet Take 10 mEq by mouth daily.           methadone (DOLOPHINE) 10 MG/5ML solution Take 80 mg by mouth daily.           promethazine (PHENERGAN) 25 MG tablet Take 25 mg by mouth every 6 (six) hours as needed. For nausea           atenolol (TENORMIN) 50 MG tablet Take 50 mg by mouth daily.             VVS, Skin clean, dry and intact without evidence of skin break down, no evidence of skin tears noted. Pt removed own IV catheter without instruction.  Site without signs and symptoms of complications. Dressing and pressure applied by pt.   An After Visit Summary was printed and given to the patient. Patient left floor without being escorted to private auto after being told she needed someone to walk down with her.   Debbora Presto 01/01/2012 3:10 PM

## 2012-01-01 NOTE — H&P (Signed)
Family Medicine Teaching Service Attending Note  On 2-23 I interviewed and examined patient [name] and reviewed their tests and x-rays.  I discussed with Dr. Rivka Safer and reviewed their note for today.  I agree with their assessment and plan.

## 2012-01-01 NOTE — Progress Notes (Signed)
Picked up patient at 2300.  Patient resting comfortably will pain at 6/10.  Was given pain medications previously and was aware that she was not due medications currently and understood.  IV team paged and attempted to start a new IV because of the positional placement (right shoulder) of the current IV.    IV team was unsuccessful in starting a new IV.  Will continue with PO Norco and reevaluate/make MD aware of issue with IV and administering IV pain medications. Will continue to monitor.

## 2012-02-12 ENCOUNTER — Encounter (HOSPITAL_COMMUNITY): Payer: Self-pay

## 2012-02-12 ENCOUNTER — Emergency Department (INDEPENDENT_AMBULATORY_CARE_PROVIDER_SITE_OTHER)
Admission: EM | Admit: 2012-02-12 | Discharge: 2012-02-12 | Disposition: A | Discharge status: Home / Self Care | Payer: Medicare Other | Source: Home / Self Care | Attending: Family Medicine | Admitting: Family Medicine

## 2012-02-12 DIAGNOSIS — J020 Streptococcal pharyngitis: Secondary | ICD-10-CM

## 2012-02-12 HISTORY — DX: Other chronic pain: G89.29

## 2012-02-12 LAB — POCT RAPID STREP A: Streptococcus, Group A Screen (Direct): POSITIVE — AB

## 2012-02-12 MED ORDER — LIDOCAINE VISCOUS 2 % MT SOLN: 20.0000 mL | OROMUCOSAL | Status: AC | PRN

## 2012-02-12 MED ORDER — AMOXICILLIN 500 MG PO CAPS: 500.0000 mg | ORAL_CAPSULE | Freq: Three times a day (TID) | ORAL | Status: AC

## 2012-02-12 MED ORDER — HYDROCODONE-ACETAMINOPHEN 5-325 MG PO TABS: ORAL_TABLET | ORAL | Status: AC

## 2012-02-12 NOTE — Discharge Instructions (Signed)
Take antibiotics as directed. Use viscous lidocaine as directed, do not swallow. Use pain medication as directed and only as needed. Return to care should your symptoms not improve, or worsen in any way.

## 2012-02-12 NOTE — ED Notes (Signed)
C/o sore throat for 3 days.  States it is making her MS pain worse.

## 2012-02-13 NOTE — ED Provider Notes (Signed)
History     CSN: [id number]  Arrival date & time 02/12/12  1755   First MD Initiated Contact with Patient 02/12/12 1822      Chief Complaint  Patient presents with  . Sore Throat    (Consider location/radiation/quality/duration/timing/severity/associated sxs/prior treatment) HPI Comments: [name] presents for evaluation of several days of sore throat with swallowing, low-grade fever. She reports that her children have all been sick with similar symptoms, and she "thinks she caught it."  Patient is a 46 y.o. female presenting with pharyngitis. The history is provided by the patient.  Sore Throat This is a new problem. The current episode started more than 2 days ago. The problem occurs constantly. The problem has not changed since onset.Pertinent negatives include no chest pain, no abdominal pain, no headaches and no shortness of breath. The symptoms are aggravated by swallowing, eating and drinking. The symptoms are relieved by nothing. She has tried nothing for the symptoms.    Past Medical History  Diagnosis Date  . Multiple sclerosis   . Irritable bowel syndrome   . Arthritis   . Pancreatitis   . Anxiety   . Depression   . Grave's disease   . Chronic pain     Past Surgical History  Procedure Date  . Sphincterotomy   . Cholecystectomy     Family History  Problem Relation Age of Onset  . Cancer Mother   . Hypertension Father     History  Substance Use Topics  . Smoking status: Current Everyday Smoker -- 0.5 packs/day for 8 years  . Smokeless tobacco: Never Used  . Alcohol Use: No    OB History    Grav Para Term Preterm Abortions TAB SAB Ect Mult Living                  Review of Systems  Constitutional: Positive for fever.  HENT: Positive for sore throat and trouble swallowing.   Eyes: Negative.   Respiratory: Negative.  Negative for shortness of breath.   Cardiovascular: Negative.  Negative for chest pain.  Gastrointestinal: Negative.  Negative for  abdominal pain.  Genitourinary: Negative.   Musculoskeletal: Negative.   Skin: Negative.   Neurological: Negative.  Negative for headaches.    Allergies  Erythromycin and Morphine and related  Home Medications   Current Outpatient Rx  Name Route Sig Dispense Refill  . ATENOLOL 50 MG PO TABS Oral Take 50 mg by mouth daily.    . BUSPIRONE HCL 10 MG PO TABS Oral Take 10 mg by mouth 2 (two) times daily. upto three times a day    . CYCLOBENZAPRINE HCL 10 MG PO TABS Oral Take 10 mg by mouth 3 (three) times daily as needed. For muscle spasms.    . DICLOFENAC SODIUM 75 MG PO TBEC Oral Take 75 mg by mouth daily.     Marland Kitchen DICLOFENAC SODIUM 1 % TD GEL Topical Apply 1 application topically 2 (two) times daily. Knees, hips, neck    . ESCITALOPRAM OXALATE 20 MG PO TABS Oral Take 30 mg by mouth daily.     Marland Kitchen LANSOPRAZOLE 15 MG PO CPDR Oral Take 15 mg by mouth daily.    Marland Kitchen METHADONE HCL 10 MG/5ML PO SOLN Oral Take 80 mg by mouth daily.    Marland Kitchen ONDANSETRON HCL PO Oral Take by mouth.    . AMOXICILLIN 500 MG PO CAPS Oral Take 1 capsule (500 mg total) by mouth 3 (three) times daily. 30 capsule 0  . HYDROCODONE-ACETAMINOPHEN  5-325 MG PO TABS  Take one to two tablets every 4 to 6 hours as needed for pain 20 tablet 0  . LIDOCAINE VISCOUS 2 % MT SOLN Oral Take 20 mLs by mouth as needed for pain. Gargle with 15 to 20 ml every 3 to 4 hours as needed for pain; do not swallow 100 mL 0  . POTASSIUM CHLORIDE CRYS ER 10 MEQ PO TBCR Oral Take 10 mEq by mouth daily.    Marland Kitchen PROMETHAZINE HCL 25 MG PO TABS Oral Take 25 mg by mouth every 6 (six) hours as needed. For nausea      BP 109/66  Pulse 82  Temp(Src) 98.1 F (36.7 C) (Oral)  Resp 18  SpO2 97%  Physical Exam  Nursing note and vitals reviewed. Constitutional: She is oriented to person, place, and time. She appears well-developed and well-nourished.  HENT:  Head: Normocephalic and atraumatic.  Right Ear: Tympanic membrane normal.  Left Ear: Tympanic membrane  normal.  Mouth/Throat: Uvula is midline. Oropharyngeal exudate, posterior oropharyngeal edema and posterior oropharyngeal erythema present.    Eyes: EOM are normal.  Neck: Normal range of motion.  Pulmonary/Chest: Effort normal.  Musculoskeletal: Normal range of motion.  Neurological: She is alert and oriented to person, place, and time.  Skin: Skin is warm and dry.  Psychiatric: Her behavior is normal.    ED Course  Procedures (including critical care time)  Labs Reviewed  POCT RAPID STREP A ([facility]RG CARE ONLY) - Abnormal; Notable for the following:    Streptococcus, Group A Screen (Direct) POSITIVE (*)    All other components within normal limits  LAB REPORT - SCANNED   No results found.   1. Strep pharyngitis       MDM  rx given for amoxicillin, viscous lidocaine, and hydrocodone PRN        Renaee Munda, MD 02/13/12 [phone]

## 2012-02-20 ENCOUNTER — Other Ambulatory Visit: Payer: Self-pay | Admitting: Family Medicine

## 2012-02-20 NOTE — Telephone Encounter (Signed)
Left message for patient to call clinic to arrange endocrinology follow up for Graves Dx. I have been following her in Epic and she has not seen endocrine from what I can see.

## 2012-02-29 ENCOUNTER — Other Ambulatory Visit: Payer: Self-pay | Admitting: Internal Medicine

## 2012-02-29 DIAGNOSIS — Z1231 Encounter for screening mammogram for malignant neoplasm of breast: Secondary | ICD-10-CM

## 2012-04-22 ENCOUNTER — Encounter (HOSPITAL_COMMUNITY): Payer: Self-pay | Admitting: Emergency Medicine

## 2012-04-22 ENCOUNTER — Emergency Department (HOSPITAL_COMMUNITY)
Admission: EM | Admit: 2012-04-22 | Discharge: 2012-04-23 | Disposition: A | Discharge status: Home / Self Care | Payer: Medicare Other | Attending: Emergency Medicine | Admitting: Emergency Medicine

## 2012-04-22 DIAGNOSIS — R002 Palpitations: Secondary | ICD-10-CM | POA: Insufficient documentation

## 2012-04-22 HISTORY — DX: Thyrotoxicosis with diffuse goiter without thyrotoxic crisis or storm: E05.00

## 2012-04-22 NOTE — ED Notes (Signed)
PT. REPORTS PALPITATIONS ONSET  THIS EVENING , DENIES CHEST PAIN , NO SOB OR COUGH .

## 2012-04-23 LAB — CBC
Hemoglobin: 13.2 g/dL (ref 12.0–15.0)
MCH: 30.3 pg (ref 26.0–34.0)
MCHC: 34 g/dL (ref 30.0–36.0)
Platelets: 254 10*3/uL (ref 150–400)
RDW: 13.2 % (ref 11.5–15.5)

## 2012-04-23 LAB — BASIC METABOLIC PANEL
CO2: 31 mEq/L (ref 19–32)
Calcium: 9.1 mg/dL (ref 8.4–10.5)
Chloride: 98 mEq/L (ref 96–112)
GFR calc Af Amer: >90 mL/min (ref >90)
Sodium: 140 mEq/L (ref 135–145)

## 2012-04-23 LAB — DIFFERENTIAL
Basophils Absolute: 0 10*3/uL (ref 0.0–0.1)
Basophils Relative: 0 % (ref 0–1)
Eosinophils Absolute: 0.3 10*3/uL (ref 0.0–0.7)
Neutro Abs: 4.3 10*3/uL (ref 1.7–7.7)
Neutrophils Relative %: 47 % (ref 43–77)

## 2012-05-13 ENCOUNTER — Emergency Department (HOSPITAL_COMMUNITY)
Admission: EM | Admit: 2012-05-13 | Discharge: 2012-05-14 | Disposition: A | Discharge status: Home / Self Care | Payer: Medicare Other | Attending: Emergency Medicine | Admitting: Emergency Medicine

## 2012-05-13 ENCOUNTER — Encounter (HOSPITAL_COMMUNITY): Payer: Self-pay | Admitting: *Deleted

## 2012-05-13 ENCOUNTER — Other Ambulatory Visit: Payer: Self-pay

## 2012-05-13 DIAGNOSIS — R61 Generalized hyperhidrosis: Secondary | ICD-10-CM | POA: Insufficient documentation

## 2012-05-13 DIAGNOSIS — G35 Multiple sclerosis: Secondary | ICD-10-CM | POA: Insufficient documentation

## 2012-05-13 DIAGNOSIS — R079 Chest pain, unspecified: Secondary | ICD-10-CM | POA: Insufficient documentation

## 2012-05-13 DIAGNOSIS — R002 Palpitations: Secondary | ICD-10-CM | POA: Insufficient documentation

## 2012-05-13 DIAGNOSIS — F17200 Nicotine dependence, unspecified, uncomplicated: Secondary | ICD-10-CM | POA: Insufficient documentation

## 2012-05-13 DIAGNOSIS — M129 Arthropathy, unspecified: Secondary | ICD-10-CM | POA: Insufficient documentation

## 2012-05-13 DIAGNOSIS — R5381 Other malaise: Secondary | ICD-10-CM | POA: Insufficient documentation

## 2012-05-13 DIAGNOSIS — R209 Unspecified disturbances of skin sensation: Secondary | ICD-10-CM | POA: Insufficient documentation

## 2012-05-13 DIAGNOSIS — R5383 Other fatigue: Secondary | ICD-10-CM | POA: Insufficient documentation

## 2012-05-13 DIAGNOSIS — F329 Major depressive disorder, single episode, unspecified: Secondary | ICD-10-CM | POA: Insufficient documentation

## 2012-05-13 DIAGNOSIS — E0500 Thyrotoxicosis with diffuse goiter without thyrotoxic crisis or storm: Secondary | ICD-10-CM

## 2012-05-13 DIAGNOSIS — F411 Generalized anxiety disorder: Secondary | ICD-10-CM | POA: Insufficient documentation

## 2012-05-13 DIAGNOSIS — Z79899 Other long term (current) drug therapy: Secondary | ICD-10-CM | POA: Insufficient documentation

## 2012-05-13 DIAGNOSIS — R51 Headache: Secondary | ICD-10-CM | POA: Insufficient documentation

## 2012-05-13 DIAGNOSIS — R609 Edema, unspecified: Secondary | ICD-10-CM | POA: Insufficient documentation

## 2012-05-13 DIAGNOSIS — R110 Nausea: Secondary | ICD-10-CM | POA: Insufficient documentation

## 2012-05-13 DIAGNOSIS — F3289 Other specified depressive episodes: Secondary | ICD-10-CM | POA: Insufficient documentation

## 2012-05-13 DIAGNOSIS — E0590 Thyrotoxicosis, unspecified without thyrotoxic crisis or storm: Secondary | ICD-10-CM

## 2012-05-13 DIAGNOSIS — R55 Syncope and collapse: Secondary | ICD-10-CM | POA: Insufficient documentation

## 2012-05-13 DIAGNOSIS — R42 Dizziness and giddiness: Secondary | ICD-10-CM | POA: Insufficient documentation

## 2012-05-13 LAB — CBC WITH DIFFERENTIAL/PLATELET
Basophils Absolute: 0 10*3/uL (ref 0.0–0.1)
Eosinophils Relative: 1 % (ref 0–5)
Lymphocytes Relative: 42 % (ref 12–46)
Lymphs Abs: 3 10*3/uL (ref 0.7–4.0)
MCV: 89.8 fL (ref 78.0–100.0)
Neutro Abs: 3.7 10*3/uL (ref 1.7–7.7)
Platelets: 290 10*3/uL (ref 150–400)
RBC: 4.01 MIL/uL (ref 3.87–5.11)
RDW: 13.4 % (ref 11.5–15.5)
WBC: 7.3 10*3/uL (ref 4.0–10.5)

## 2012-05-13 LAB — COMPREHENSIVE METABOLIC PANEL
ALT: 16 U/L (ref 0–35)
AST: 26 U/L (ref 0–37)
Alkaline Phosphatase: 172 U/L — ABNORMAL HIGH (ref 39–117)
CO2: 29 mEq/L (ref 19–32)
Chloride: 99 mEq/L (ref 96–112)
GFR calc Af Amer: >90 mL/min (ref >90)
GFR calc non Af Amer: >90 mL/min (ref >90)
Glucose, Bld: 100 mg/dL — ABNORMAL HIGH (ref 70–99)
Potassium: 2.5 mEq/L — CL (ref 3.5–5.1)
Sodium: 138 mEq/L (ref 135–145)
Total Bilirubin: 0.2 mg/dL — ABNORMAL LOW (ref 0.3–1.2)

## 2012-05-13 MED ORDER — SODIUM CHLORIDE 0.9 % IV SOLN
1000.0000 mL | Freq: Once | INTRAVENOUS | Status: AC
  Administered 2012-05-13: 1000 mL via INTRAVENOUS

## 2012-05-13 MED ORDER — POTASSIUM CHLORIDE 10 MEQ/100ML IV SOLN
10.0000 meq | Freq: Once | INTRAVENOUS | Status: AC
  Administered 2012-05-13: 10 meq via INTRAVENOUS
  Filled 2012-05-13: qty 100

## 2012-05-13 MED ORDER — ONDANSETRON 4 MG PO TBDP
8.0000 mg | ORAL_TABLET | Freq: Once | ORAL | Status: AC
  Administered 2012-05-14: 8 mg via ORAL
  Filled 2012-05-13: qty 2

## 2012-05-13 MED ORDER — CYCLOBENZAPRINE HCL 10 MG PO TABS
10.0000 mg | ORAL_TABLET | Freq: Once | ORAL | Status: AC
  Administered 2012-05-14: 10 mg via ORAL
  Filled 2012-05-13: qty 1

## 2012-05-13 NOTE — ED Notes (Signed)
Performed EKG on pt and attached previous EKG to current, signed by Dr. Karma Ganja

## 2012-05-13 NOTE — ED Provider Notes (Signed)
History     CSN: [id number]  Arrival date & time 05/13/12  2048   First MD Initiated Contact with Patient 05/13/12 2301      Chief Complaint  Patient presents with  . Tachycardia    (Consider location/radiation/quality/duration/timing/severity/associated sxs/prior treatment) Patient is a 46 y.o. female presenting with palpitations. The history is provided by the patient. No language interpreter was used.  Palpitations  This is a new problem. The problem occurs hourly. The problem has been gradually worsening. The problem is associated with anxiety and stress. Associated symptoms include diaphoresis, malaise/fatigue, numbness, chest pressure, exertional chest pressure, irregular heartbeat, near-syncope, nausea, headaches, leg pain, lower extremity edema and dizziness. Pertinent negatives include no fever, no chest pain, no vomiting, no back pain, no weakness and no shortness of breath. She has tried nothing for the symptoms. Risk factors include smoking/tobacco exposure and stress. Her past medical history is significant for hyperthyroidism. Her past medical history does not include anemia, heart disease or valve disorder.  Pt c/o palpatations that started yesterday and were intermittant until tonight. 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  She is going to Dr. Chestine Spore in the next couple weeks.    States that she has been sweating, SOB with exertion, chest pressure especially with exertion.  States that she has had LE muscle pain and edema.  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  Nausea with no vomiting.  Requesting flexeril for LE pain.    Past Medical History  Diagnosis Date  . Multiple sclerosis   . Irritable bowel syndrome   . Arthritis   . Pancreatitis   . Anxiety   . Depression   . Grave's disease   . Chronic pain   . Graves disease     Past Surgical History  Procedure Date  . Sphincterotomy   . Cholecystectomy     Family History  Problem Relation Age  of Onset  . Cancer Mother   . Hypertension Father     History  Substance Use Topics  . Smoking status: Current Everyday Smoker -- 0.5 packs/day for 8 years  . Smokeless tobacco: Never Used  . Alcohol Use: No    OB History    Grav Para Term Preterm Abortions TAB SAB Ect Mult Living                  Review of Systems  Constitutional: Positive for malaise/fatigue and diaphoresis. Negative for fever.  Eyes: Negative.   Respiratory: Negative.  Negative for shortness of breath.   Cardiovascular: Positive for palpitations and near-syncope. Negative for chest pain.  Gastrointestinal: Positive for nausea. Negative for vomiting.  Musculoskeletal: Negative for back pain.  Neurological: Positive for dizziness, numbness and headaches. Negative for weakness.  Psychiatric/Behavioral: Negative.   All other systems reviewed and are negative.    Allergies  Erythromycin and Morphine and related  Home Medications   Current Outpatient Rx  Name Route Sig Dispense Refill  . ALBUTEROL SULFATE HFA 108 (90 BASE) MCG/ACT IN AERS Inhalation Inhale 2 puffs into the lungs every 4 (four) hours as needed. For shortness of breath    . ATENOLOL 50 MG PO TABS Oral Take 50 mg by mouth daily.    . BUPROPION HCL ER (XL) 150 MG PO TB24 Oral Take 150 mg by mouth daily.    . CYCLOBENZAPRINE HCL 10 MG PO TABS Oral Take 10 mg by mouth 3 (three) times daily as needed. For muscle spasms.    Marland Kitchen  DICLOFENAC SODIUM 75 MG PO TBEC Oral Take 75 mg by mouth daily.     Marland Kitchen DICLOFENAC SODIUM 1 % TD GEL Topical Apply 1 application topically 2 (two) times daily. Knees, hips, neck    . ESCITALOPRAM OXALATE 10 MG PO TABS Oral Take 30 mg by mouth daily.    . IMIPRAMINE HCL 50 MG PO TABS Oral Take 50 mg by mouth at bedtime.    Marland Kitchen LANSOPRAZOLE 15 MG PO CPDR Oral Take 15 mg by mouth daily.    Marland Kitchen METHADONE HCL 10 MG/5ML PO SOLN Oral Take 83 mg by mouth daily.     . ADULT MULTIVITAMIN W/MINERALS CH Oral Take 1 tablet by mouth daily.      Marland Kitchen ONDANSETRON 4 MG PO TBDP Oral Take 4 mg by mouth every 8 (eight) hours as needed. For nausea    . POTASSIUM CHLORIDE CRYS ER 10 MEQ PO TBCR Oral Take 10 mEq by mouth daily.      BP 141/66  Pulse 89  Temp 98.1 F (36.7 C) (Oral)  Resp 18  Ht 5\' 4"  (1.626 m)  Wt 140 lb (63.504 kg)  BMI 24.03 kg/m2  SpO2 100%  Physical Exam  Nursing note and vitals reviewed. Constitutional: She is oriented to person, place, and time. She appears well-developed and well-nourished.  HENT:  Head: Normocephalic.  Eyes: Conjunctivae and EOM are normal. Pupils are equal, round, and reactive to light.  Neck: Normal range of motion. Neck supple.  Cardiovascular: Normal rate, regular rhythm and normal heart sounds.   Pulmonary/Chest: Effort normal and breath sounds normal. No respiratory distress.  Abdominal: Soft. Bowel sounds are normal. She exhibits no distension. There is no tenderness.  Musculoskeletal: Normal range of motion. She exhibits edema and tenderness.       2+ pitting edema to bilateral LE  Tenderness to bilateral tib fib anterior.  Neurological: She is alert and oriented to person, place, and time.  Skin: Skin is warm and dry.  Psychiatric: She has a normal mood and affect.    ED Course  Procedures (including critical care time)  Labs Reviewed  COMPREHENSIVE METABOLIC PANEL - Abnormal; Notable for the following:    Potassium 2.5 (*)     Glucose, Bld 100 (*)     BUN <3 (*)     Creatinine, Ser 0.49 (*)     Albumin 2.5 (*)     Alkaline Phosphatase 172 (*)     Total Bilirubin 0.2 (*)     All other components within normal limits  CBC WITH DIFFERENTIAL  TROPONIN I  TSH   No results found.   No diagnosis found.    MDM  Palpatations, Hypokalemia, dehydration and LE pain with MS and graves dz. IVF and potassium given in the E R with elevated alk phos. Pain meds and muscle relaxor.  Patient feels better and ready for discharge. rx for potassium.  Follow up with Dr. Chestine Spore for  graves dz.    Labs Reviewed  COMPREHENSIVE METABOLIC PANEL - Abnormal; Notable for the following:    Potassium 2.5 (*)     Glucose, Bld 100 (*)     BUN <3 (*)     Creatinine, Ser 0.49 (*)     Albumin 2.5 (*)     Alkaline Phosphatase 172 (*)     Total Bilirubin 0.2 (*)     All other components within normal limits  CBC WITH DIFFERENTIAL  TROPONIN I  TSH  Remi Haggard, NP 05/14/12 1209

## 2012-05-13 NOTE — ED Notes (Signed)
The pt says she has had a racing heart rate since yesterday intermittently.  Hx of graves disease.  No acute distress at present

## 2012-05-14 MED ORDER — POTASSIUM CHLORIDE ER 10 MEQ PO TBCR: 20.0000 meq | EXTENDED_RELEASE_TABLET | Freq: Two times a day (BID) | ORAL | Status: DC

## 2012-05-14 MED ORDER — POTASSIUM CHLORIDE CRYS ER 20 MEQ PO TBCR
40.0000 meq | EXTENDED_RELEASE_TABLET | Freq: Once | ORAL | Status: AC
  Administered 2012-05-14: 40 meq via ORAL
  Filled 2012-05-14: qty 2

## 2012-05-14 MED ORDER — OXYCODONE-ACETAMINOPHEN 5-325 MG PO TABS
1.0000 | ORAL_TABLET | Freq: Once | ORAL | Status: AC
  Administered 2012-05-14: 1 via ORAL
  Filled 2012-05-14: qty 1

## 2012-05-14 MED ORDER — ONDANSETRON HCL 4 MG PO TABS: 4.0000 mg | ORAL_TABLET | Freq: Four times a day (QID) | ORAL | Status: AC

## 2012-05-14 NOTE — ED Notes (Signed)
The patient is AOx4 and comfortable with her discharge instructions.  The patient is being driven home.

## 2012-05-14 NOTE — ED Provider Notes (Signed)
Medical screening examination/treatment/procedure(s) were performed by non-physician practitioner and as supervising physician I was immediately available for consultation/collaboration.    Vida Roller, MD 05/14/12 [phone]

## 2012-05-17 ENCOUNTER — Encounter (HOSPITAL_COMMUNITY): Payer: Self-pay | Admitting: Emergency Medicine

## 2012-05-17 DIAGNOSIS — Z79899 Other long term (current) drug therapy: Secondary | ICD-10-CM | POA: Insufficient documentation

## 2012-05-17 DIAGNOSIS — G8929 Other chronic pain: Secondary | ICD-10-CM | POA: Insufficient documentation

## 2012-05-17 DIAGNOSIS — F3289 Other specified depressive episodes: Secondary | ICD-10-CM | POA: Insufficient documentation

## 2012-05-17 DIAGNOSIS — R1013 Epigastric pain: Secondary | ICD-10-CM | POA: Insufficient documentation

## 2012-05-17 DIAGNOSIS — K861 Other chronic pancreatitis: Secondary | ICD-10-CM | POA: Insufficient documentation

## 2012-05-17 DIAGNOSIS — F329 Major depressive disorder, single episode, unspecified: Secondary | ICD-10-CM | POA: Insufficient documentation

## 2012-05-17 DIAGNOSIS — R112 Nausea with vomiting, unspecified: Secondary | ICD-10-CM | POA: Insufficient documentation

## 2012-05-17 DIAGNOSIS — E0500 Thyrotoxicosis with diffuse goiter without thyrotoxic crisis or storm: Secondary | ICD-10-CM | POA: Insufficient documentation

## 2012-05-17 DIAGNOSIS — F17200 Nicotine dependence, unspecified, uncomplicated: Secondary | ICD-10-CM | POA: Insufficient documentation

## 2012-05-17 DIAGNOSIS — F411 Generalized anxiety disorder: Secondary | ICD-10-CM | POA: Insufficient documentation

## 2012-05-17 DIAGNOSIS — G35 Multiple sclerosis: Secondary | ICD-10-CM | POA: Insufficient documentation

## 2012-05-17 NOTE — ED Notes (Addendum)
Reports not eating, nausea, and bruising easily; was here on Tuesday for similar- but reports at that time her chest was hurting; denies CP today; states that she does not feel any better; has been taking zofran for nausea with relief

## 2012-05-18 ENCOUNTER — Emergency Department (HOSPITAL_COMMUNITY)
Admission: EM | Admit: 2012-05-18 | Discharge: 2012-05-18 | Disposition: A | Discharge status: Home / Self Care | Payer: Medicare Other | Attending: Emergency Medicine | Admitting: Emergency Medicine

## 2012-05-18 DIAGNOSIS — R110 Nausea: Secondary | ICD-10-CM

## 2012-05-18 DIAGNOSIS — G8929 Other chronic pain: Secondary | ICD-10-CM

## 2012-05-18 LAB — LIPASE, BLOOD: Lipase: 58 U/L (ref 11–59)

## 2012-05-18 LAB — COMPREHENSIVE METABOLIC PANEL
ALT: 24 U/L (ref 0–35)
AST: 38 U/L — ABNORMAL HIGH (ref 0–37)
CO2: 31 mEq/L (ref 19–32)
Calcium: 8.8 mg/dL (ref 8.4–10.5)
Creatinine, Ser: 0.45 mg/dL — ABNORMAL LOW (ref 0.50–1.10)
GFR calc Af Amer: >90 mL/min (ref >90)
GFR calc non Af Amer: >90 mL/min (ref >90)
Glucose, Bld: 101 mg/dL — ABNORMAL HIGH (ref 70–99)
Sodium: 140 mEq/L (ref 135–145)
Total Protein: 6.6 g/dL (ref 6.0–8.3)

## 2012-05-18 LAB — URINALYSIS, ROUTINE W REFLEX MICROSCOPIC
Nitrite: NEGATIVE
Specific Gravity, Urine: 1.002 — ABNORMAL LOW (ref 1.005–1.030)
Urobilinogen, UA: 1 mg/dL (ref 0.0–1.0)
pH: 7 (ref 5.0–8.0)

## 2012-05-18 LAB — CBC WITH DIFFERENTIAL/PLATELET
Basophils Absolute: 0 10*3/uL (ref 0.0–0.1)
Eosinophils Absolute: 0.2 10*3/uL (ref 0.0–0.7)
Eosinophils Relative: 1 % (ref 0–5)
HCT: 38.3 % (ref 36.0–46.0)
Lymphocytes Relative: 23 % (ref 12–46)
MCH: 29.9 pg (ref 26.0–34.0)
MCV: 89.5 fL (ref 78.0–100.0)
Monocytes Absolute: 0.8 10*3/uL (ref 0.1–1.0)
Platelets: 289 10*3/uL (ref 150–400)
RDW: 13.7 % (ref 11.5–15.5)

## 2012-05-18 MED ORDER — SODIUM CHLORIDE 0.9 % IV BOLUS (SEPSIS)
1000.0000 mL | Freq: Once | INTRAVENOUS | Status: AC
  Administered 2012-05-18: 1000 mL via INTRAVENOUS

## 2012-05-18 MED ORDER — DIPHENHYDRAMINE HCL 50 MG/ML IJ SOLN
25.0000 mg | Freq: Once | INTRAMUSCULAR | Status: AC
  Administered 2012-05-18: 25 mg via INTRAVENOUS
  Filled 2012-05-18: qty 1

## 2012-05-18 MED ORDER — ONDANSETRON 8 MG/NS 50 ML IVPB
8.0000 mg | Freq: Once | INTRAVENOUS | Status: AC
  Administered 2012-05-18: 8 mg via INTRAVENOUS
  Filled 2012-05-18: qty 8

## 2012-05-18 MED ORDER — OXYCODONE-ACETAMINOPHEN 5-325 MG PO TABS: 1.0000 | ORAL_TABLET | ORAL | Status: AC | PRN

## 2012-05-18 MED ORDER — HYDROMORPHONE HCL PF 1 MG/ML IJ SOLN
1.0000 mg | Freq: Once | INTRAMUSCULAR | Status: AC
  Administered 2012-05-18: 1 mg via INTRAVENOUS
  Filled 2012-05-18: qty 1

## 2012-05-18 NOTE — ED Notes (Signed)
Unable to try IV x 2.  Provider aware

## 2012-05-18 NOTE — ED Provider Notes (Signed)
Medical screening examination/treatment/procedure(s) were conducted as a shared visit with non-physician practitioner(s) and myself.  I personally evaluated the patient during the encounter  Please see my separate respective documentation pertaining to this patient encounter   Vida Roller, MD 05/18/12 [phone]

## 2012-05-18 NOTE — ED Notes (Signed)
46 year old female with a history of chronic abdominal pain, pancreatitis who presents with several days of nausea and epigastric pain. She states this is similar to her prior pancreatitis pain in the past, she is nonalcoholic, does not drink alcohol and has had a prior cholecystectomy. She is sphincter of oddi I dysfunction, has had 2 sphincterotomies and stent placement in the past.  Physical exam:  Mild to moderate epigastric tenderness with mild guarding, no other abdominal tenderness, no peritoneal signs. Lungs are clear, heart is regular without murmurs or tachycardia on my exam, normal capillary refill, no peripheral edema  Assessment:  Labs reviewed showing no significant findings, patient is able speak in full sentences, has ongoing nausea which is more concerning her pain. Will check labs, lipase, pain and nausea medication, IV fluids. Overall the patient does not appear to be in any distress.  Labs reviewed showing white blood cell count of 13,000, platelets are normal, hemoglobin is normal. Lipase is normal range. Review of the medical record shows that she was recently admitted to the hospital for pancreatitis and had a very slightly elevated lipase.   Results for orders placed during the hospital encounter of 05/18/12  CBC WITH DIFFERENTIAL      Component Value Range   WBC 13.2 (*) 4.0 - 10.5 K/uL   RBC 4.28  3.87 - 5.11 MIL/uL   Hemoglobin 12.8  12.0 - 15.0 g/dL   HCT 16.1  09.6 - 04.5 %   MCV 89.5  78.0 - 100.0 fL   MCH 29.9  26.0 - 34.0 pg   MCHC 33.4  30.0 - 36.0 g/dL   RDW 40.9  81.1 - 91.4 %   Platelets 289  150 - 400 K/uL   Neutrophils Relative 69  43 - 77 %   Neutro Abs 9.1 (*) 1.7 - 7.7 K/uL   Lymphocytes Relative 23  12 - 46 %   Lymphs Abs 3.1  0.7 - 4.0 K/uL   Monocytes Relative 6  3 - 12 %   Monocytes Absolute 0.8  0.1 - 1.0 K/uL   Eosinophils Relative 1  0 - 5 %   Eosinophils Absolute 0.2  0.0 - 0.7 K/uL   Basophils Relative 0  0 - 1 %   Basophils Absolute 0.0   0.0 - 0.1 K/uL  COMPREHENSIVE METABOLIC PANEL      Component Value Range   Sodium 140  135 - 145 mEq/L   Potassium 2.9 (*) 3.5 - 5.1 mEq/L   Chloride 98  96 - 112 mEq/L   CO2 31  19 - 32 mEq/L   Glucose, Bld 101 (*) 70 - 99 mg/dL   BUN <3 (*) 6 - 23 mg/dL   Creatinine, Ser 7.82 (*) 0.50 - 1.10 mg/dL   Calcium 8.8  8.4 - 95.6 mg/dL   Total Protein 6.6  6.0 - 8.3 g/dL   Albumin 2.7 (*) 3.5 - 5.2 g/dL   AST 38 (*) 0 - 37 U/L   ALT 24  0 - 35 U/L   Alkaline Phosphatase 179 (*) 39 - 117 U/L   Total Bilirubin 0.3  0.3 - 1.2 mg/dL   GFR calc non Af Amer >90  >90 mL/min   GFR calc Af Amer >90  >90 mL/min  URINALYSIS, ROUTINE W REFLEX MICROSCOPIC      Component Value Range   Color, Urine YELLOW  YELLOW   APPearance CLEAR  CLEAR   Specific Gravity, Urine 1.002 (*) 1.005 - 1.030   pH  7.0  5.0 - 8.0   Glucose, UA NEGATIVE  NEGATIVE mg/dL   Hgb urine dipstick NEGATIVE  NEGATIVE   Bilirubin Urine NEGATIVE  NEGATIVE   Ketones, ur NEGATIVE  NEGATIVE mg/dL   Protein, ur NEGATIVE  NEGATIVE mg/dL   Urobilinogen, UA 1.0  0.0 - 1.0 mg/dL   Nitrite NEGATIVE  NEGATIVE   Leukocytes, UA NEGATIVE  NEGATIVE  LIPASE, BLOOD      Component Value Range   Lipase 58  11 - 59 U/L   No results found.  Medical screening examination/treatment/procedure(s) were conducted as a shared visit with non-physician practitioner(s) and myself.  I personally evaluated the patient during the encounter    Vida Roller, MD 05/18/12 [phone]

## 2012-05-18 NOTE — ED Provider Notes (Signed)
History     CSN: [id number]  Arrival date & time 05/17/12  2312   First MD Initiated Contact with Patient 05/18/12 0103      Chief Complaint  Patient presents with  . Abdominal Pain    (Consider location/radiation/quality/duration/timing/severity/associated sxs/prior treatment) HPI Comments: [name] 46 y.o. female   The chief complaint is: Patient presents with:   Abdominal Pain   The patient has medical history significant for:   Past Medical History:   Multiple sclerosis                                           Irritable bowel syndrome                                     Arthritis                                                    Pancreatitis                                                 Anxiety                                                      Depression                                                   Grave's disease                                              Chronic pain                                                Patient presents with abdominal pain and nausea since her last ED visit on 05/13/12. She states that pain is in the epigastric region, constant, with some transmission to the back. She rates the pain 5/10 and is similar to her prior pancreatitis episodes in the past. Patient has not been able to eat that well, diet consists of jello and toast. Patient states that she had some mild relief with zonfran (last taken at 10pm). Denies fever, chills. Denies vomiting or diarrhea. Denies dysuria, urgency. Denies vaginal discharge or pain.  Patient is a 46 y.o. female presenting with abdominal pain.  Abdominal Pain The primary symptoms of the illness include abdominal pain, nausea and vomiting. The primary symptoms of the illness do not include fever, shortness of breath, diarrhea, dysuria or vaginal discharge.  Symptoms associated with the illness do not include chills or urgency.    Past Medical  History  Diagnosis Date  . Multiple sclerosis   . Irritable bowel syndrome   . Arthritis   . Pancreatitis   . Anxiety   . Depression   . Grave's disease   . Chronic pain   . Graves disease     Past Surgical History  Procedure Date  . Sphincterotomy   . Cholecystectomy     Family History  Problem Relation Age of Onset  . Cancer Mother   . Hypertension Father     History  Substance Use Topics  . Smoking status: Current Everyday Smoker -- 0.5 packs/day for 8 years  . Smokeless tobacco: Never Used  . Alcohol Use: No    OB History    Grav Para Term Preterm Abortions TAB SAB Ect Mult Living                  Review of Systems  Constitutional: Negative for fever and chills.  Respiratory: Negative for chest tightness and shortness of breath.   Cardiovascular: Negative for chest pain.  Gastrointestinal: Positive for nausea, vomiting and abdominal pain. Negative for diarrhea.  Genitourinary: Negative for dysuria, urgency, vaginal discharge and vaginal pain.  Skin: Positive for color change.       Spontaneous bruising on arms and legs.  Hematological: Bruises/bleeds easily.    Allergies  Erythromycin and Morphine and related  Home Medications   Current Outpatient Rx  Name Route Sig Dispense Refill  . ALBUTEROL SULFATE HFA 108 (90 BASE) MCG/ACT IN AERS Inhalation Inhale 2 puffs into the lungs every 4 (four) hours as needed. For shortness of breath    . BUPROPION HCL ER (XL) 150 MG PO TB24 Oral Take 150 mg by mouth daily.    . CYCLOBENZAPRINE HCL 10 MG PO TABS Oral Take 10 mg by mouth 3 (three) times daily as needed. For muscle spasms.    . DICLOFENAC SODIUM 75 MG PO TBEC Oral Take 75 mg by mouth daily.     Marland Kitchen DICLOFENAC SODIUM 1 % TD GEL Topical Apply 1 application topically 2 (two) times daily. Knees, hips, neck    . ESCITALOPRAM OXALATE 10 MG PO TABS Oral Take 30 mg by mouth daily.    . IMIPRAMINE HCL 50 MG PO TABS Oral Take 50 mg by mouth at bedtime.    Marland Kitchen  LANSOPRAZOLE 15 MG PO CPDR Oral Take 15 mg by mouth daily.    Marland Kitchen METHADONE HCL 10 MG/5ML PO SOLN Oral Take 83 mg by mouth daily.     . ADULT MULTIVITAMIN W/MINERALS CH Oral Take 1 tablet by mouth daily.    Marland Kitchen ONDANSETRON HCL 4 MG PO TABS Oral Take 1 tablet (4 mg total) by mouth every 6 (six) hours. 12 tablet 0  . POTASSIUM CHLORIDE CRYS ER 10 MEQ PO TBCR Oral Take 10 mEq by mouth daily.      BP 135/77  Pulse 101  Temp 98.4 F (36.9 C) (Oral)  Resp 20  SpO2 98%  Physical Exam  Constitutional: She appears well-developed and well-nourished. She appears distressed.       Patient in mild distress.  HENT:  Head: Normocephalic and  atraumatic.  Mouth/Throat: Oropharynx is clear and moist.  Cardiovascular: Normal rate, regular rhythm and normal heart sounds.   Pulmonary/Chest: Effort normal and breath sounds normal.  Abdominal: Soft. Bowel sounds are normal. She exhibits no distension and no mass. There is tenderness. There is no rebound and no guarding.  Neurological: She is alert.  Skin: Skin is dry and intact. Bruising noted.       ED Course  Procedures (including critical care time)  Labs Reviewed  CBC WITH DIFFERENTIAL - Abnormal; Notable for the following:    WBC 13.2 (*)     Neutro Abs 9.1 (*)     All other components within normal limits  COMPREHENSIVE METABOLIC PANEL - Abnormal; Notable for the following:    Potassium 2.9 (*)     Glucose, Bld 101 (*)     BUN <3 (*)     Creatinine, Ser 0.45 (*)     Albumin 2.7 (*)     AST 38 (*)     Alkaline Phosphatase 179 (*)     All other components within normal limits  URINALYSIS, ROUTINE W REFLEX MICROSCOPIC - Abnormal; Notable for the following:    Specific Gravity, Urine 1.002 (*)     All other components within normal limits  LIPASE, BLOOD   No results found. Results for orders placed during the hospital encounter of 05/18/12  CBC WITH DIFFERENTIAL      Component Value Range   WBC 13.2 (*) 4.0 - 10.5 K/uL   RBC 4.28  3.87  - 5.11 MIL/uL   Hemoglobin 12.8  12.0 - 15.0 g/dL   HCT 40.9  81.1 - 91.4 %   MCV 89.5  78.0 - 100.0 fL   MCH 29.9  26.0 - 34.0 pg   MCHC 33.4  30.0 - 36.0 g/dL   RDW 78.2  95.6 - 21.3 %   Platelets 289  150 - 400 K/uL   Neutrophils Relative 69  43 - 77 %   Neutro Abs 9.1 (*) 1.7 - 7.7 K/uL   Lymphocytes Relative 23  12 - 46 %   Lymphs Abs 3.1  0.7 - 4.0 K/uL   Monocytes Relative 6  3 - 12 %   Monocytes Absolute 0.8  0.1 - 1.0 K/uL   Eosinophils Relative 1  0 - 5 %   Eosinophils Absolute 0.2  0.0 - 0.7 K/uL   Basophils Relative 0  0 - 1 %   Basophils Absolute 0.0  0.0 - 0.1 K/uL  COMPREHENSIVE METABOLIC PANEL      Component Value Range   Sodium 140  135 - 145 mEq/L   Potassium 2.9 (*) 3.5 - 5.1 mEq/L   Chloride 98  96 - 112 mEq/L   CO2 31  19 - 32 mEq/L   Glucose, Bld 101 (*) 70 - 99 mg/dL   BUN <3 (*) 6 - 23 mg/dL   Creatinine, Ser 0.86 (*) 0.50 - 1.10 mg/dL   Calcium 8.8  8.4 - 57.8 mg/dL   Total Protein 6.6  6.0 - 8.3 g/dL   Albumin 2.7 (*) 3.5 - 5.2 g/dL   AST 38 (*) 0 - 37 U/L   ALT 24  0 - 35 U/L   Alkaline Phosphatase 179 (*) 39 - 117 U/L   Total Bilirubin 0.3  0.3 - 1.2 mg/dL   GFR calc non Af Amer >90  >90 mL/min   GFR calc Af Amer >90  >90 mL/min  URINALYSIS, ROUTINE W REFLEX MICROSCOPIC  Component Value Range   Color, Urine YELLOW  YELLOW   APPearance CLEAR  CLEAR   Specific Gravity, Urine 1.002 (*) 1.005 - 1.030   pH 7.0  5.0 - 8.0   Glucose, UA NEGATIVE  NEGATIVE mg/dL   Hgb urine dipstick NEGATIVE  NEGATIVE   Bilirubin Urine NEGATIVE  NEGATIVE   Ketones, ur NEGATIVE  NEGATIVE mg/dL   Protein, ur NEGATIVE  NEGATIVE mg/dL   Urobilinogen, UA 1.0  0.0 - 1.0 mg/dL   Nitrite NEGATIVE  NEGATIVE   Leukocytes, UA NEGATIVE  NEGATIVE  LIPASE, BLOOD      Component Value Range   Lipase 58  11 - 59 U/L     1. Abdominal pain, chronic, epigastric   2. 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 Patient given Dilaudid, Benadryl as patient has some itching with Dilaudid, and Zofran for nausea and pain management. Patient also give fluid bolus. CBC, CMP, UA, Lipase, unremarkable. Patient has no red flags for peritonitis, pancreatitis, or other acute abdominal process. Patient also has complaint of 1 week easy bruising to arms and legs. Platelets: 289. Patient instructed that bruising should be followed by PCP. Patient reassessed and pain 2/10 and no longer nauseous. Patient given return precautions verbally and in discharge instructions.        Pixie Casino, PA-C 05/18/12 [phone]

## 2012-05-28 ENCOUNTER — Other Ambulatory Visit: Payer: Self-pay

## 2012-05-28 ENCOUNTER — Inpatient Hospital Stay (HOSPITAL_COMMUNITY)
Admission: EM | Admit: 2012-05-28 | Discharge: 2012-05-30 | DRG: 313 | Disposition: A | Discharge status: Home / Self Care | Payer: Medicare Other | Attending: Internal Medicine | Admitting: Internal Medicine

## 2012-05-28 ENCOUNTER — Encounter (HOSPITAL_COMMUNITY): Payer: Self-pay | Admitting: Internal Medicine

## 2012-05-28 ENCOUNTER — Emergency Department (HOSPITAL_COMMUNITY): Payer: Medicare Other

## 2012-05-28 DIAGNOSIS — F1120 Opioid dependence, uncomplicated: Secondary | ICD-10-CM | POA: Diagnosis present

## 2012-05-28 DIAGNOSIS — F329 Major depressive disorder, single episode, unspecified: Secondary | ICD-10-CM | POA: Diagnosis present

## 2012-05-28 DIAGNOSIS — E0590 Thyrotoxicosis, unspecified without thyrotoxic crisis or storm: Secondary | ICD-10-CM | POA: Diagnosis present

## 2012-05-28 DIAGNOSIS — E0500 Thyrotoxicosis with diffuse goiter without thyrotoxic crisis or storm: Secondary | ICD-10-CM

## 2012-05-28 DIAGNOSIS — K838 Other specified diseases of biliary tract: Secondary | ICD-10-CM | POA: Diagnosis present

## 2012-05-28 DIAGNOSIS — Z881 Allergy status to other antibiotic agents status: Secondary | ICD-10-CM

## 2012-05-28 DIAGNOSIS — E876 Hypokalemia: Secondary | ICD-10-CM | POA: Diagnosis present

## 2012-05-28 DIAGNOSIS — Z7982 Long term (current) use of aspirin: Secondary | ICD-10-CM

## 2012-05-28 DIAGNOSIS — F3289 Other specified depressive episodes: Secondary | ICD-10-CM | POA: Diagnosis present

## 2012-05-28 DIAGNOSIS — Z888 Allergy status to other drugs, medicaments and biological substances status: Secondary | ICD-10-CM

## 2012-05-28 DIAGNOSIS — R0789 Other chest pain: Principal | ICD-10-CM | POA: Diagnosis present

## 2012-05-28 DIAGNOSIS — F411 Generalized anxiety disorder: Secondary | ICD-10-CM | POA: Diagnosis present

## 2012-05-28 DIAGNOSIS — F17200 Nicotine dependence, unspecified, uncomplicated: Secondary | ICD-10-CM | POA: Diagnosis present

## 2012-05-28 DIAGNOSIS — Z79899 Other long term (current) drug therapy: Secondary | ICD-10-CM

## 2012-05-28 DIAGNOSIS — K834 Spasm of sphincter of Oddi: Secondary | ICD-10-CM | POA: Diagnosis present

## 2012-05-28 DIAGNOSIS — G35 Multiple sclerosis: Secondary | ICD-10-CM | POA: Diagnosis present

## 2012-05-28 DIAGNOSIS — Z8249 Family history of ischemic heart disease and other diseases of the circulatory system: Secondary | ICD-10-CM

## 2012-05-28 DIAGNOSIS — G894 Chronic pain syndrome: Secondary | ICD-10-CM | POA: Diagnosis present

## 2012-05-28 DIAGNOSIS — K589 Irritable bowel syndrome without diarrhea: Secondary | ICD-10-CM | POA: Diagnosis present

## 2012-05-28 DIAGNOSIS — R079 Chest pain, unspecified: Secondary | ICD-10-CM

## 2012-05-28 MED ORDER — SODIUM CHLORIDE 0.9 % IV SOLN
INTRAVENOUS | Status: DC
  Administered 2012-05-29: 02:00:00 via INTRAVENOUS

## 2012-05-28 MED ORDER — POTASSIUM CHLORIDE 20 MEQ/15ML (10%) PO LIQD
40.0000 meq | Freq: Once | ORAL | Status: AC
  Administered 2012-05-29: 40 meq via ORAL
  Filled 2012-05-28: qty 30

## 2012-05-28 MED ORDER — SODIUM CHLORIDE 0.9 % IV BOLUS (SEPSIS)
1000.0000 mL | Freq: Once | INTRAVENOUS | Status: AC
  Administered 2012-05-29: 1000 mL via INTRAVENOUS

## 2012-05-28 MED ORDER — MAGNESIUM CHLORIDE 64 MG PO TBEC
1.0000 | DELAYED_RELEASE_TABLET | Freq: Once | ORAL | Status: AC
  Administered 2012-05-29: 64 mg via ORAL
  Filled 2012-05-28: qty 1

## 2012-05-28 NOTE — ED Notes (Signed)
Pt reports feeling chest squeezing pressure similar to recent episode when her potassium was low

## 2012-05-28 NOTE — ED Notes (Signed)
MD at bedside. 

## 2012-05-28 NOTE — ED Provider Notes (Signed)
History     CSN: [id number]  Arrival date & time 05/28/12  2254   First MD Initiated Contact with Patient 05/28/12 2307      Chief Complaint  Patient presents with  . Chest Pain    (Consider location/radiation/quality/duration/timing/severity/associated sxs/prior treatment) HPI HX per PT, gets squeezing pressure pain L chest, worse with exertion, new since last week, also hear racing, diagnosed with Graves Feb this year, scheduled to see endocrine. No F/C, feels dehydrated/ dec appetite. Nausea with vomiting yesterday. No diarrhea. No rash or neck swelling. Having palpitations with pain. Some SOB,no back pain. No radiation fo pain, no h/o CAD, has seen Dr Dannette Barbara for long QT syndrome. Has never had a stress test. Past Medical History  Diagnosis Date  . Multiple sclerosis   . Irritable bowel syndrome   . Arthritis   . Pancreatitis   . Anxiety   . Depression   . Grave's disease   . Chronic pain   . Graves disease     Past Surgical History  Procedure Date  . Sphincterotomy   . Cholecystectomy     Family History  Problem Relation Age of Onset  . Cancer Mother   . Hypertension Father     History  Substance Use Topics  . Smoking status: Current Everyday Smoker -- 0.5 packs/day for 8 years  . Smokeless tobacco: Never Used  . Alcohol Use: No    OB History    Grav Para Term Preterm Abortions TAB SAB Ect Mult Living                  Review of Systems  Constitutional: Negative for fever and chills.  HENT: Negative for neck pain and neck stiffness.   Eyes: Negative for visual disturbance.  Respiratory: Positive for shortness of breath. Negative for cough.   Cardiovascular: Positive for chest pain.  Gastrointestinal: Negative for abdominal pain.  Genitourinary: Negative for dysuria.  Musculoskeletal: Negative for back pain.  Skin: Negative for rash.  Neurological: Negative for headaches.  All other systems reviewed and are negative.    Allergies  Erythromycin  and Morphine and related  Home Medications   Current Outpatient Rx  Name Route Sig Dispense Refill  . ALBUTEROL SULFATE HFA 108 (90 BASE) MCG/ACT IN AERS Inhalation Inhale 2 puffs into the lungs every 4 (four) hours as needed. For shortness of breath    . BUPROPION HCL ER (XL) 150 MG PO TB24 Oral Take 150 mg by mouth daily.    . CYCLOBENZAPRINE HCL 10 MG PO TABS Oral Take 10 mg by mouth 3 (three) times daily as needed. For muscle spasms.    . DICLOFENAC SODIUM 75 MG PO TBEC Oral Take 75 mg by mouth daily.     Marland Kitchen DICLOFENAC SODIUM 1 % TD GEL Topical Apply 1 application topically 2 (two) times daily. Knees, hips, neck    . ESCITALOPRAM OXALATE 10 MG PO TABS Oral Take 30 mg by mouth daily.    . IMIPRAMINE HCL 50 MG PO TABS Oral Take 50 mg by mouth at bedtime.    Marland Kitchen LANSOPRAZOLE 15 MG PO CPDR Oral Take 15 mg by mouth daily.    Marland Kitchen METHADONE HCL 10 MG/5ML PO SOLN Oral Take 83 mg by mouth daily.     . ADULT MULTIVITAMIN W/MINERALS CH Oral Take 1 tablet by mouth daily.    . OXYCODONE-ACETAMINOPHEN 5-325 MG PO TABS Oral Take 1 tablet by mouth every 4 (four) hours as needed for pain. 15 tablet  0  . POTASSIUM CHLORIDE CRYS ER 10 MEQ PO TBCR Oral Take 10 mEq by mouth daily.      BP 147/86  Pulse 105  Temp 98 F (36.7 C) (Oral)  Resp 18  Ht 5\' 3"  (1.6 m)  Wt 140 lb (63.504 kg)  BMI 24.80 kg/m2  SpO2 97%  Physical Exam  Constitutional: She is oriented to person, place, and time. She appears well-developed and well-nourished.  HENT:  Head: Normocephalic and atraumatic.       Dry mm  Eyes: EOM are normal. Pupils are equal, round, and reactive to light.  Neck: Trachea normal. Neck supple.  Cardiovascular: Regular rhythm, S1 normal, S2 normal and normal pulses.     No systolic murmur is present   No diastolic murmur is present  Pulses:      Radial pulses are 2+ on the right side, and 2+ on the left side.       tachycardia  Pulmonary/Chest: Effort normal and breath sounds normal. She has no  wheezes. She has no rhonchi. She has no rales. She exhibits no tenderness.  Abdominal: Soft. Normal appearance and bowel sounds are normal. There is no tenderness. There is no CVA tenderness and negative Murphy's sign.  Musculoskeletal: She exhibits no edema and no tenderness.       BLE:s Calves nontender, no cords or erythema, negative Homans sign  Neurological: She is alert and oriented to person, place, and time. She has normal strength. No cranial nerve deficit or sensory deficit. GCS eye subscore is 4. GCS verbal subscore is 5. GCS motor subscore is 6.  Skin: Skin is warm and dry. No rash noted. She is not diaphoretic.  Psychiatric: Her speech is normal.       Cooperative and appropriate    ED Course  Procedures (including critical care time)  Results for orders placed during the hospital encounter of 05/28/12  CBC      Component Value Range   WBC 8.9  4.0 - 10.5 K/uL   RBC 3.78 (*) 3.87 - 5.11 MIL/uL   Hemoglobin 11.5 (*) 12.0 - 15.0 g/dL   HCT 40.9 (*) 81.1 - 91.4 %   MCV 89.9  78.0 - 100.0 fL   MCH 30.4  26.0 - 34.0 pg   MCHC 33.8  30.0 - 36.0 g/dL   RDW 78.2  95.6 - 21.3 %   Platelets 297  150 - 400 K/uL  D-DIMER, QUANTITATIVE      Component Value Range   D-Dimer, Quant 0.25  0.00 - 0.48 ug/mL-FEU  POCT I-STAT, CHEM 8      Component Value Range   Sodium 138  135 - 145 mEq/L   Potassium 2.6 (*) 3.5 - 5.1 mEq/L   Chloride 99  96 - 112 mEq/L   BUN <3 (*) 6 - 23 mg/dL   Creatinine, Ser 0.86  0.50 - 1.10 mg/dL   Glucose, Bld 76  70 - 99 mg/dL   Calcium, Ion 5.78 (*) 1.12 - 1.23 mmol/L   TCO2 24  0 - 100 mmol/L   Hemoglobin 11.6 (*) 12.0 - 15.0 g/dL   HCT 46.9 (*) 62.9 - 52.8 %   Comment NOTIFIED PHYSICIAN    POCT I-STAT TROPONIN I      Component Value Range   Troponin i, poc 0.00  0.00 - 0.08 ng/mL   Comment 3            Dg Chest Portable 1 View  05/28/2012  *RADIOLOGY REPORT*  Clinical Data: Chest pain.  CHEST - 1 VIEW  Comparison:  01/05/2011  Findings: The heart  size and mediastinal contours are within normal limits.  Both lungs are clear.  IMPRESSION: No active disease.  Original Report Authenticated By: Reola Calkins, M.D.    Dg Chest Portable 1 View  05/28/2012  *RADIOLOGY REPORT*  Clinical Data: Chest pain.  CHEST - 1 VIEW  Comparison:  01/05/2011  Findings: The heart size and mediastinal contours are within normal limits.  Both lungs are clear.  IMPRESSION: No active disease.  Original Report Authenticated By: Reola Calkins, M.D.    Date: 05/29/2012  Rate: 112  Rhythm: normal sinus rhythm  QRS Axis: normal  Intervals: normal  ST/T Wave abnormalities: nonspecific ST changes  Conduction Disutrbances:none  Narrative Interpretation: multiple PVCs  Old EKG Reviewed: changes noted   Potassium for hypokalemia, magnesium provided as well.  IVFs for clinical dehydration. ECG reviewed multiple PVCs, d/w CAR and Dr Mayford Knife feels PVCs likley related to Graves, recs BB prn and CAR can follow in the am as needed.   2:51 AM discussed with Dr. Houston Siren and plan admission [facility] telemetry team 10 MDM   Old records reviewed. Nursing notes reviewed. Vital signs reviewed. IV fluids for tachycardia and clinical dehydration confirmed with labs as above. Potassium for hypokalemia. Plan medical admission.        Sunnie Nielsen, MD 05/29/12 [phone]

## 2012-05-29 ENCOUNTER — Encounter (HOSPITAL_COMMUNITY): Payer: Self-pay | Admitting: Internal Medicine

## 2012-05-29 ENCOUNTER — Observation Stay (HOSPITAL_COMMUNITY): Payer: Medicare Other

## 2012-05-29 DIAGNOSIS — R079 Chest pain, unspecified: Secondary | ICD-10-CM

## 2012-05-29 DIAGNOSIS — E0590 Thyrotoxicosis, unspecified without thyrotoxic crisis or storm: Secondary | ICD-10-CM

## 2012-05-29 DIAGNOSIS — F1120 Opioid dependence, uncomplicated: Secondary | ICD-10-CM

## 2012-05-29 DIAGNOSIS — E0500 Thyrotoxicosis with diffuse goiter without thyrotoxic crisis or storm: Secondary | ICD-10-CM

## 2012-05-29 DIAGNOSIS — E876 Hypokalemia: Secondary | ICD-10-CM | POA: Diagnosis present

## 2012-05-29 LAB — COMPREHENSIVE METABOLIC PANEL
ALT: 14 U/L (ref 0–35)
AST: 20 U/L (ref 0–37)
Alkaline Phosphatase: 160 U/L — ABNORMAL HIGH (ref 39–117)
CO2: 36 mEq/L — ABNORMAL HIGH (ref 19–32)
Calcium: 8.7 mg/dL (ref 8.4–10.5)
Chloride: 100 mEq/L (ref 96–112)
GFR calc Af Amer: >90 mL/min (ref >90)
GFR calc non Af Amer: >90 mL/min (ref >90)
Glucose, Bld: 70 mg/dL (ref 70–99)
Potassium: 2.5 mEq/L — CL (ref 3.5–5.1)
Sodium: 143 mEq/L (ref 135–145)
Total Bilirubin: 0.3 mg/dL (ref 0.3–1.2)

## 2012-05-29 LAB — POCT I-STAT, CHEM 8
BUN: <3 mg/dL — ABNORMAL LOW (ref 6–23)
Creatinine, Ser: 1 mg/dL (ref 0.50–1.10)
Glucose, Bld: 76 mg/dL (ref 70–99)
Sodium: 138 mEq/L (ref 135–145)
TCO2: 24 mmol/L (ref 0–100)

## 2012-05-29 LAB — CBC
MCH: 30.3 pg (ref 26.0–34.0)
MCHC: 32.8 g/dL (ref 30.0–36.0)
Platelets: 297 10*3/uL (ref 150–400)
RBC: 3.78 MIL/uL — ABNORMAL LOW (ref 3.87–5.11)
RDW: 14.1 % (ref 11.5–15.5)
RDW: 14.6 % (ref 11.5–15.5)
WBC: 8.9 10*3/uL (ref 4.0–10.5)

## 2012-05-29 LAB — BASIC METABOLIC PANEL
BUN: <3 mg/dL — ABNORMAL LOW (ref 6–23)
CO2: 30 mEq/L (ref 19–32)
Chloride: 103 mEq/L (ref 96–112)
GFR calc non Af Amer: >90 mL/min (ref >90)
Glucose, Bld: 75 mg/dL (ref 70–99)
Potassium: 2.9 mEq/L — ABNORMAL LOW (ref 3.5–5.1)

## 2012-05-29 LAB — CARDIAC PANEL(CRET KIN+CKTOT+MB+TROPI)
CK, MB: 1.9 ng/mL (ref 0.3–4.0)
CK, MB: 2.3 ng/mL (ref 0.3–4.0)
Relative Index: INVALID (ref 0.0–2.5)
Total CK: 55 U/L (ref 7–177)
Troponin I: <0.3 ng/mL (ref <0.30)
Troponin I: <0.3 ng/mL (ref <0.30)

## 2012-05-29 LAB — MAGNESIUM: Magnesium: 1.9 mg/dL (ref 1.5–2.5)

## 2012-05-29 LAB — HEMOGLOBIN A1C
Hgb A1c MFr Bld: 5.1 % (ref <5.7)
Mean Plasma Glucose: 100 mg/dL (ref <117)

## 2012-05-29 LAB — POCT I-STAT TROPONIN I: Troponin i, poc: 0 ng/mL (ref 0.00–0.08)

## 2012-05-29 MED ORDER — ALBUTEROL SULFATE HFA 108 (90 BASE) MCG/ACT IN AERS: 2.0000 | INHALATION_SPRAY | RESPIRATORY_TRACT | Status: DC | PRN

## 2012-05-29 MED ORDER — ASPIRIN EC 325 MG PO TBEC
325.0000 mg | DELAYED_RELEASE_TABLET | Freq: Every day | ORAL | Status: DC
  Administered 2012-05-29 – 2012-05-30 (×2): 325 mg via ORAL
  Filled 2012-05-29 (×2): qty 1

## 2012-05-29 MED ORDER — NICOTINE 14 MG/24HR TD PT24
14.0000 mg | MEDICATED_PATCH | Freq: Every day | TRANSDERMAL | Status: DC
  Administered 2012-05-29 – 2012-05-30 (×2): 14 mg via TRANSDERMAL
  Filled 2012-05-29 (×2): qty 1

## 2012-05-29 MED ORDER — ENOXAPARIN SODIUM 40 MG/0.4ML ~~LOC~~ SOLN
40.0000 mg | SUBCUTANEOUS | Status: DC
  Administered 2012-05-29 – 2012-05-30 (×2): 40 mg via SUBCUTANEOUS
  Filled 2012-05-29 (×2): qty 0.4

## 2012-05-29 MED ORDER — METOPROLOL TARTRATE 1 MG/ML IV SOLN
INTRAVENOUS | Status: AC
  Administered 2012-05-29: 5 mg
  Filled 2012-05-29: qty 5

## 2012-05-29 MED ORDER — SODIUM CHLORIDE 0.9 % IJ SOLN
3.0000 mL | Freq: Two times a day (BID) | INTRAMUSCULAR | Status: DC
  Administered 2012-05-30: 3 mL via INTRAVENOUS

## 2012-05-29 MED ORDER — DICLOFENAC SODIUM 1 % TD GEL
1.0000 "application " | Freq: Two times a day (BID) | TRANSDERMAL | Status: DC
  Administered 2012-05-29 – 2012-05-30 (×3): 1 via TOPICAL
  Filled 2012-05-29: qty 100

## 2012-05-29 MED ORDER — ONDANSETRON 4 MG PO TBDP
8.0000 mg | ORAL_TABLET | Freq: Once | ORAL | Status: AC
Start: 2012-05-29 — End: 2012-05-29
  Administered 2012-05-29: 8 mg via ORAL
  Filled 2012-05-29: qty 2

## 2012-05-29 MED ORDER — METOPROLOL TARTRATE 1 MG/ML IV SOLN
5.0000 mg | Freq: Once | INTRAVENOUS | Status: AC
  Administered 2012-05-29: 5 mg via INTRAVENOUS
  Filled 2012-05-29: qty 5

## 2012-05-29 MED ORDER — ONDANSETRON HCL 4 MG PO TABS
4.0000 mg | ORAL_TABLET | Freq: Four times a day (QID) | ORAL | Status: DC | PRN
  Administered 2012-05-29: 4 mg via ORAL
  Filled 2012-05-29: qty 1

## 2012-05-29 MED ORDER — POTASSIUM CHLORIDE CRYS ER 20 MEQ PO TBCR
40.0000 meq | EXTENDED_RELEASE_TABLET | Freq: Every day | ORAL | Status: DC
  Administered 2012-05-29: 40 meq via ORAL
  Filled 2012-05-29 (×2): qty 2

## 2012-05-29 MED ORDER — ESCITALOPRAM OXALATE 20 MG PO TABS
30.0000 mg | ORAL_TABLET | Freq: Every day | ORAL | Status: DC
  Administered 2012-05-29 – 2012-05-30 (×2): 30 mg via ORAL
  Filled 2012-05-29 (×2): qty 1

## 2012-05-29 MED ORDER — PANTOPRAZOLE SODIUM 20 MG PO TBEC
20.0000 mg | DELAYED_RELEASE_TABLET | Freq: Every day | ORAL | Status: DC
  Administered 2012-05-29 – 2012-05-30 (×2): 20 mg via ORAL
  Filled 2012-05-29 (×2): qty 1

## 2012-05-29 MED ORDER — METHADONE HCL 10 MG/5ML PO SOLN: 83.0000 mg | Freq: Every day | ORAL | Status: DC

## 2012-05-29 MED ORDER — ONDANSETRON HCL 4 MG/2ML IJ SOLN
4.0000 mg | Freq: Four times a day (QID) | INTRAMUSCULAR | Status: DC | PRN
  Administered 2012-05-29: 4 mg via INTRAVENOUS
  Filled 2012-05-29: qty 2

## 2012-05-29 MED ORDER — CYCLOBENZAPRINE HCL 10 MG PO TABS
10.0000 mg | ORAL_TABLET | Freq: Three times a day (TID) | ORAL | Status: DC
  Administered 2012-05-29 – 2012-05-30 (×3): 10 mg via ORAL
  Filled 2012-05-29 (×5): qty 1

## 2012-05-29 MED ORDER — METHIMAZOLE 5 MG PO TABS
5.0000 mg | ORAL_TABLET | Freq: Every day | ORAL | Status: DC
  Administered 2012-05-29 – 2012-05-30 (×2): 5 mg via ORAL
  Filled 2012-05-29 (×2): qty 1

## 2012-05-29 MED ORDER — MAGNESIUM SULFATE IN D5W 10-5 MG/ML-% IV SOLN
1.0000 g | Freq: Once | INTRAVENOUS | Status: AC
  Administered 2012-05-29: 1 g via INTRAVENOUS
  Filled 2012-05-29: qty 100

## 2012-05-29 MED ORDER — METHADONE HCL 10 MG PO TABS
80.0000 mg | ORAL_TABLET | Freq: Every day | ORAL | Status: DC
  Administered 2012-05-29 – 2012-05-30 (×2): 80 mg via ORAL
  Filled 2012-05-29 (×2): qty 8

## 2012-05-29 MED ORDER — CYCLOBENZAPRINE HCL 10 MG PO TABS
10.0000 mg | ORAL_TABLET | Freq: Once | ORAL | Status: AC
  Administered 2012-05-29: 10 mg via ORAL
  Filled 2012-05-29 (×2): qty 1

## 2012-05-29 MED ORDER — BUPROPION HCL ER (XL) 150 MG PO TB24
150.0000 mg | ORAL_TABLET | Freq: Every day | ORAL | Status: DC
  Administered 2012-05-29 – 2012-05-30 (×2): 150 mg via ORAL
  Filled 2012-05-29 (×2): qty 1

## 2012-05-29 MED ORDER — DOCUSATE SODIUM 100 MG PO CAPS
100.0000 mg | ORAL_CAPSULE | Freq: Two times a day (BID) | ORAL | Status: DC
  Administered 2012-05-29 – 2012-05-30 (×3): 100 mg via ORAL
  Filled 2012-05-29 (×4): qty 1

## 2012-05-29 MED ORDER — POTASSIUM CHLORIDE 10 MEQ/100ML IV SOLN
10.0000 meq | INTRAVENOUS | Status: AC
  Administered 2012-05-29 (×4): 10 meq via INTRAVENOUS
  Filled 2012-05-29 (×3): qty 100

## 2012-05-29 MED ORDER — IMIPRAMINE HCL 50 MG PO TABS
50.0000 mg | ORAL_TABLET | Freq: Every day | ORAL | Status: DC
  Administered 2012-05-29: 50 mg via ORAL
  Filled 2012-05-29 (×2): qty 1

## 2012-05-29 MED ORDER — ATENOLOL 50 MG PO TABS
50.0000 mg | ORAL_TABLET | Freq: Every day | ORAL | Status: DC
  Administered 2012-05-30: 50 mg via ORAL
  Filled 2012-05-29: qty 1

## 2012-05-29 MED ORDER — POTASSIUM CHLORIDE IN NACL 40-0.9 MEQ/L-% IV SOLN
INTRAVENOUS | Status: DC
  Administered 2012-05-29 (×2): via INTRAVENOUS
  Filled 2012-05-29 (×6): qty 1000

## 2012-05-29 MED ORDER — HYDROMORPHONE HCL PF 1 MG/ML IJ SOLN: 1.0000 mg | INTRAMUSCULAR | Status: DC | PRN

## 2012-05-29 NOTE — Progress Notes (Signed)
[facility]                                                                                Patient Demographics  [name], is a 46 y.o. female  JYN:[id number]  QMV:[id number]  DOB - 1965-12-20  Admit date - 05/28/2012  Admitting Physician Houston Siren, MD  Outpatient Primary MD for the patient is [name], MD  LOS - 1   Chief Complaint  Patient presents with  . Chest Pain        Assessment & Plan    Active Problems:  Sphincter of Oddi dysfunction  Opioid dependence  Hyperthyroidism  Chest pain at rest  Hypokalemia  1. Chest pain. patient has no EKG changes, first set of troponins is negative, we'll cycle total of 3 sets, on aspirin, no evidence of on tele. Echo pending.  2. Hyperthyroidism. Patient had nuclear medicine scan in February compatible with Graves, ultrasound thyroid done today compatible with Graves as well, last TSH level early this month was low, patient was started on atenolol for her hyperthyroid symptoms, and methimazole for graves, will follow with endocrinology as an outpatient.  3. Hypokalemia. Replaced  4. MS. Stable  5. Chronic pain syndrome with opioid dependence, continue current regimen, continue to follow her PCP and pain management as outpatient.  Code Status: Full  Family Communication: D/W patient  Disposition Plan: home    Procedures None    Consults  None   Antibiotics  None   Time Spent in minutes   35  DVT Prophylaxis  Lovenox   Ayce Pietrzyk M.D on 05/29/2012 at 1:31 PM  Between 7am to 7pm - Pager - [phone]  After 7pm go to [email or web address] - password [email or web address]  And look for the night coverage person covering for me after hours  [facility] Office  [phone]    Subjective:   [name] today has, No headache,  chest pain resolved , No abdominal pain - No Nausea, No new weakness tingling or numbness, No Cough - SOB.  Objective:   Filed Vitals:   05/29/12 0252  05/29/12 0435 05/29/12 0621 05/29/12 1321  BP: 109/66 108/72 90/56 93/61   Pulse: 96 91 83 90  Temp: 97.7 F (36.5 C) 97.7 F (36.5 C)  98.6 F (37 C)  TempSrc: Oral Oral  Oral  Resp: 16 18  18   Height:      Weight:  65.908 kg (145 lb 4.8 oz)    SpO2: 98% 95%  94%    Wt Readings from Last 3 Encounters:  05/29/12 65.908 kg (145 lb 4.8 oz)  05/13/12 63.504 kg (140 lb)  12/29/11 68 kg (149 lb 14.6 oz)     Intake/Output Summary (Last 24 hours) at 05/29/12 1331 Last data filed at 05/29/12 1300  Gross per 24 hour  Intake    480 ml  Output    650 ml  Net   -170 ml    Exam Awake Alert, Oriented X 3, No new F.N deficits, Normal affect [place].AT,PERRAL Supple Neck,No JVD, No cervical lymphadenopathy appriciated.  Symmetrical Chest wall movement, Good air movement bilaterally, CTAB RRR,No Gallops,Rubs or new Murmurs, No Parasternal Heave +ve B.Sounds, Abd  Soft, Non tender, No organomegaly appriciated, No rebound - guarding or rigidity. No Cyanosis, Clubbing or edema, No new Rash or bruise     Data Review   CBC  Lab 05/29/12 0055 05/28/12 2320  WBC -- 8.9  HGB 11.6* 11.5*  HCT 34.0* 34.0*  PLT -- 297  MCV -- 89.9  MCH -- 30.4  MCHC -- 33.8  RDW -- 14.1  LYMPHSABS -- --  MONOABS -- --  EOSABS -- --  BASOSABS -- --  BANDABS -- --    Chemistries   Lab 05/29/12 0600 05/29/12 0055 05/28/12 2320  NA 142 138 143  K 2.9* 2.6* 2.5*  CL 103 99 100  CO2 30 -- 36*  GLUCOSE 75 76 70  BUN <3* <3* <3*  CREATININE 0.42* 1.00 0.51  CALCIUM 8.0* -- 8.7  MG 1.9 -- --  AST -- -- 20  ALT -- -- 14  ALKPHOS -- -- 160*  BILITOT -- -- 0.3   ------------------------------------------------------------------------------------------------------------------ estimated creatinine clearance is 81 ml/min (by C-G formula based on Cr of 0.42). ------------------------------------------------------------------------------------------------------------------ No results found for this  basename: HGBA1C:2 in the last 72 hours ------------------------------------------------------------------------------------------------------------------ No results found for this basename: CHOL:2,HDL:2,LDLCALC:2,TRIG:2,CHOLHDL:2,LDLDIRECT:2 in the last 72 hours ------------------------------------------------------------------------------------------------------------------ No results found for this basename: TSH,T4TOTAL,FREET3,T3FREE,THYROIDAB in the last 72 hours ------------------------------------------------------------------------------------------------------------------ No results found for this basename: VITAMINB12:2,FOLATE:2,FERRITIN:2,TIBC:2,IRON:2,RETICCTPCT:2 in the last 72 hours  Coagulation profile No results found for this basename: INR:5,PROTIME:5 in the last 168 hours   Basename 05/28/12 2320  DDIMER 0.25    Cardiac Enzymes  Lab 05/29/12 0600  CKMB 1.9  TROPONINI <0.30  MYOGLOBIN --   ------------------------------------------------------------------------------------------------------------------ No components found with this basename: POCBNP:3  Micro Results No results found for this or any previous visit (from the past 240 hour(s)).  Radiology Reports US Soft Tissue Head/neck  05/29/2012  *RADIOLOGY REPORT*  Clinical Data: Right lobe thyroid nodule.  THYROID ULTRASOUND  Technique: Ultrasound examination of the thyroid gland and adjacent soft tissues was performed.  Comparison:  CT of the neck 11/10/2009.  Findings:  Right thyroid lobe:  3.6 x 1.8 x 1.8 cm Left thyroid lobe:  3.7 x 1.8 x 1.3 cm Isthmus:  2 mm  Focal nodules:  The thyroid parenchyma is diffusely heterogeneous. There are no discrete nodules evident. The thyroid is hypervascular.  Lymphadenopathy:  None  IMPRESSION: Diffuse heterogeneity of the normal sized gland bilaterally. Hypervascularity is noted.  The findings are compatible with the previous diagnosis of Graves disease.  Original Report  Authenticated By: Jamesetta Orleans. MATTERN, M.D.   Dg Chest Portable 1 View  05/28/2012  *RADIOLOGY REPORT*  Clinical Data: Chest pain.  CHEST - 1 VIEW  Comparison:  01/05/2011  Findings: The heart size and mediastinal contours are within normal limits.  Both lungs are clear.  IMPRESSION: No active disease.  Original Report Authenticated By: Reola Calkins, M.D.    Scheduled Meds:   . aspirin EC  325 mg Oral Daily  . atenolol  50 mg Oral Daily  . buPROPion  150 mg Oral Daily  . cyclobenzaprine  10 mg Oral Once  . cyclobenzaprine  10 mg Oral TID  . diclofenac sodium  1 application Topical BID  . docusate sodium  100 mg Oral BID  . enoxaparin (LOVENOX) injection  40 mg Subcutaneous Q24H  . escitalopram  30 mg Oral Daily  . imipramine  50 mg Oral QHS  . magnesium chloride  1 tablet Oral Once  . magnesium sulfate 1 - 4 g  bolus IVPB  1 g Intravenous Once  . methadone  80 mg Oral Daily  . methimazole  5 mg Oral Daily  . metoprolol      . metoprolol  5 mg Intravenous Once  . nicotine  14 mg Transdermal Daily  . ondansetron  8 mg Oral Once  . pantoprazole  20 mg Oral Q1200  . potassium chloride  10 mEq Intravenous Q1 Hr x 4  . potassium chloride  40 mEq Oral Once  . potassium chloride  40 mEq Oral Daily  . sodium chloride  1,000 mL Intravenous Once  . sodium chloride  3 mL Intravenous Q12H  . DISCONTD: methadone  83 mg Oral Daily   Continuous Infusions:   . sodium chloride 125 mL/hr at 05/29/12 0134  . 0.9 % NaCl with KCl 40 mEq / L 125 mL/hr at 05/29/12 1256   PRN Meds:.albuterol, HYDROmorphone (DILAUDID) injection, ondansetron (ZOFRAN) IV, ondansetron

## 2012-05-29 NOTE — Progress Notes (Signed)
  Echocardiogram 2D Echocardiogram has been performed.  [name] 05/29/2012, 3:02 PM

## 2012-05-29 NOTE — ED Notes (Signed)
Critical lab K 2.6 MD aware

## 2012-05-29 NOTE — ED Notes (Signed)
Stick x2 for IV unsuccessful. IV team paged

## 2012-05-29 NOTE — ED Notes (Signed)
Admitting MD at bedside.

## 2012-05-29 NOTE — H&P (Signed)
[facility] History and Physical  [name]:[id number] DOB: [birth year]    PCP:   [name], MD   Chief Complaint: Chest tightness  HPI: [name] is an 46 y.o. 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 She denied shortness of breath, nausea, vomiting, or diaphoresis. She has been complaining of palpitation and tremors. She was originally referred to an endocrinologist since her last admission in February 2013, but never went. Therefore, she was not treated for her hyperthyroidism. Evaluation in emergency room included a chest x-ray which was unremarkable. Her EKG show no acute ST-T changes, frequent unifocal PVCs, and QTC is a 507 ms. Her initial cardiac markers was negative. She was found to have profound hypokalemia with a serum potassium of 2.6. She was given magnesium and potassium orally. Hospitalist was asked to admit her for cardiac rule out.   Rewiew of Systems:  Constitutional: Negative for malaise, fever and chills. No significant weight loss or weight gain Eyes: Negative for eye pain, redness and discharge, diplopia, visual changes, or flashes of light. ENMT: Negative for ear pain, hoarseness, nasal congestion, sinus pressure and sore throat. No headaches; tinnitus, drooling, or problem swallowing. Cardiovascular: Negative for diaphoresis, dyspnea and peripheral edema. ; No orthopnea, PND Respiratory: Negative for cough, hemoptysis, wheezing and stridor. No pleuritic chestpain. Gastrointestinal: Negative for nausea, vomiting, diarrhea, constipation, abdominal pain, melena,  blood in stool, hematemesis, jaundice and rectal bleeding.    Genitourinary: Negative for frequency, dysuria, incontinence,flank pain and hematuria; Musculoskeletal: Negative for back pain and neck pain. Negative for swelling and trauma.;  Skin: . Negative for pruritus, rash, abrasions, bruising and skin lesion.; ulcerations Neuro: Negative for headache, lightheadedness and neck stiffness. Negative for weakness, altered level of consciousness , altered mental status, extremity weakness, burning feet, involuntary movement, seizure and syncope.  Psych: negative for anxiety, depression, insomnia, tearfulness, panic attacks, hallucinations, paranoia, suicidal or homicidal ideation.   Past Medical History  Diagnosis Date  . Multiple sclerosis   . Irritable bowel syndrome   . Arthritis   . Pancreatitis   . Anxiety   . Depression   . Grave's disease   . Chronic pain   . Graves disease     Past Surgical History  Procedure Date  . Sphincterotomy   . Cholecystectomy     Medications:  HOME MEDS: Prior to Admission medications   Medication Sig Start Date End Date Taking? Authorizing Provider  albuterol (PROVENTIL HFA;VENTOLIN HFA) 108 (90 BASE) MCG/ACT inhaler Inhale 2 puffs into the lungs every 4 (four) hours as needed. For shortness of breath   Yes Historical Provider, MD  buPROPion (WELLBUTRIN XL) 150 MG 24 hr tablet Take 150 mg by mouth daily.   Yes Historical Provider, MD  cyclobenzaprine (FLEXERIL) 10 MG tablet Take 10 mg by mouth 3 (three) times daily as needed. For muscle spasms.   Yes Historical Provider, MD  diclofenac (VOLTAREN) 75 MG EC tablet Take 75 mg by mouth daily.    Yes Historical Provider, MD  diclofenac sodium (VOLTAREN) 1 % GEL Apply 1 application topically 2 (two) times daily. Knees, hips, neck   Yes Historical Provider, MD  escitalopram (  LEXAPRO) 10 MG tablet Take 30 mg by mouth daily.   Yes Historical Provider, MD  imipramine (TOFRANIL) 50 MG tablet Take 50 mg by  mouth at bedtime.   Yes Historical Provider, MD  lansoprazole (PREVACID) 15 MG capsule Take 15 mg by mouth daily.   Yes Historical Provider, MD  methadone (DOLOPHINE) 10 MG/5ML solution Take 83 mg by mouth daily.    Yes Historical Provider, MD  Multiple Vitamin (MULTIVITAMIN WITH MINERALS) TABS Take 1 tablet by mouth daily.   Yes Historical Provider, MD  oxyCODONE-acetaminophen (PERCOCET) 5-325 MG per tablet Take 1 tablet by mouth every 4 (four) hours as needed for pain. 05/18/12 05/28/12 Yes Tia Oliveri, PA-C  potassium chloride (K-DUR,KLOR-CON) 10 MEQ tablet Take 10 mEq by mouth daily.    Historical Provider, MD     Allergies:  Allergies  Allergen Reactions  . Erythromycin Nausea And Vomiting  . Morphine And Related Other (See Comments)    IV whelps on arms.    Social History:   reports that she has been smoking.  She has never used smokeless tobacco. She reports that she does not drink alcohol or use illicit drugs.  Family History: Family History  Problem Relation Age of Onset  . Cancer Mother   . Hypertension Father      Physical Exam: Filed Vitals:   05/29/12 0130 05/29/12 0200 05/29/12 0230 05/29/12 0252  BP: 118/68 116/63 111/62 109/66  Pulse: 95 95 96 96  Temp:    97.7 F (36.5 C)  TempSrc:    Oral  Resp: 17 17 17 16   Height:      Weight:      SpO2: 95% 95% 95% 98%   Blood pressure 109/66, pulse 96, temperature 97.7 F (36.5 C), temperature source Oral, resp. rate 16, height 5\' 3"  (1.6 m), weight 63.504 kg (140 lb), SpO2 98.00%.  GEN:  Pleasant  patient lying in the stretcher in no acute distress; cooperative with exam. PSYCH:  alert and oriented x4; does not appear anxious or depressed; affect is appropriate. HEENT: Mucous membranes pink and anicteric; PERRLA; EOM intact; no cervical lymphadenopathy, she has no thyromegaly, about 30 g, with a palpable right thyroid nodule. Her carotid bruit; no JVD; There were no stridor. Neck is very supple. Breasts:: Not  examined CHEST WALL: No tenderness CHEST: Normal respiration, clear to auscultation bilaterally.  HEART: Regular rhythm.  There are no murmur, rub, or gallops.   BACK: No kyphosis or scoliosis; no CVA tenderness ABDOMEN: soft and non-tender; no masses, no organomegaly, normal abdominal bowel sounds; no pannus; no intertriginous candida. There is no rebound and no distention. Rectal Exam: Not done EXTREMITIES: No bone or joint deformity; age-appropriate arthropathy of the hands and knees; no edema; no ulcerations.  There is no calf tenderness. Very minimal tremor. Genitalia: not examined PULSES: 2+ and symmetric SKIN: Normal hydration no rash or ulceration CNS: Cranial nerves 2-12 grossly intact no focal lateralizing neurologic deficit.  Speech is fluent; uvula elevated with phonation, facial symmetry and tongue midline. DTR are normal bilaterally, cerebella exam is intact, barbinski is negative and strengths are equaled bilaterally.  No sensory loss.   Labs on Admission:  Basic Metabolic Panel:  Lab 05/29/12 1610 05/28/12 2320  NA 138 143  K 2.6* 2.5*  CL 99 100  CO2 -- 36*  GLUCOSE 76 70  BUN <3* <3*  CREATININE 1.00 0.51  CALCIUM -- 8.7  MG -- --  PHOS -- --   Liver Function Tests:  Lab 05/28/12 2320  AST 20  ALT 14  ALKPHOS 160*  BILITOT 0.3  PROT 5.5*  ALBUMIN 2.4*    Lab 05/28/12 2320  LIPASE 43  AMYLASE --   No results found for this basename: AMMONIA:5 in the last 168 hours CBC:  Lab 05/29/12 0055 05/28/12 2320  WBC -- 8.9  NEUTROABS -- --  HGB 11.6* 11.5*  HCT 34.0* 34.0*  MCV -- 89.9  PLT -- 297   Cardiac Enzymes: No results found for this basename: CKTOTAL:5,CKMB:5,CKMBINDEX:5,TROPONINI:5 in the last 168 hours  CBG: No results found for this basename: GLUCAP:5 in the last 168 hours   Radiological Exams on Admission: Dg Chest Portable 1 View  05/28/2012  *RADIOLOGY REPORT*  Clinical Data: Chest pain.  CHEST - 1 VIEW  Comparison:  01/05/2011   Findings: The heart size and mediastinal contours are within normal limits.  Both lungs are clear.  IMPRESSION: No active disease.  Original Report Authenticated By: Reola Calkins, M.D.    EKG: Independently reviewed. Unifocal PVC and Bigeminy.  No acute changes.  QTc 507 msec.   Assessment/Plan Present on Admission:  .Chest pain at rest .Hypokalemia .Opioid dependence .Hyperthyroidism .Sphincter of Oddi dysfunction   PLAN:  I do suspect her hyperthyroidism is from Grave disease, although this is not firmly diagnosed.  I also found her thyroid normal in size, about 30grams or so, with a nodule on the right side.  She has chest pain, but I don't think she had an ACS.  Will admit her to telemetry for cardiac r/out.  I will start her on beta blocker, and low dose Tapazole (5mg  per day).  She is not in thyrotoxicosis, and will not require Hydrocortisone.  I will obtain full thyroid panel including T3, free T4 and TSH.  have ordered a thyroid US to evaluate the thyroid nodule.  For her chest pain, will give ASA and obtain serial CPK and troponins along with an ECHO.  She has chronic pain from MS, and we will continue her Methadone.  For her K of 2.6, she will need more aggressive Tx, and will give IV KCl along with daily PO K supplements.  Will also check a Mag level.  She is stable, full code, and will be admitted to [facility] hospitalist service under telemetry.  Other plans as per orders.  Code Status: FULL.   Houston Siren, MD. [facility] Pager [phone] 7pm to 7am.  05/29/2012, 4:08 AM

## 2012-05-29 NOTE — Progress Notes (Signed)
Utilization review complete 

## 2012-05-30 DIAGNOSIS — R079 Chest pain, unspecified: Secondary | ICD-10-CM

## 2012-05-30 DIAGNOSIS — E876 Hypokalemia: Secondary | ICD-10-CM

## 2012-05-30 LAB — BASIC METABOLIC PANEL
BUN: <3 mg/dL — ABNORMAL LOW (ref 6–23)
CO2: 27 mEq/L (ref 19–32)
Chloride: 110 mEq/L (ref 96–112)
Glucose, Bld: 84 mg/dL (ref 70–99)
Potassium: 4.9 mEq/L (ref 3.5–5.1)
Sodium: 141 mEq/L (ref 135–145)

## 2012-05-30 LAB — T4, FREE: Free T4: 1.32 ng/dL (ref 0.80–1.80)

## 2012-05-30 LAB — CBC
HCT: 33.4 % — ABNORMAL LOW (ref 36.0–46.0)
Hemoglobin: 10.8 g/dL — ABNORMAL LOW (ref 12.0–15.0)
MCHC: 32.3 g/dL (ref 30.0–36.0)
RBC: 3.58 MIL/uL — ABNORMAL LOW (ref 3.87–5.11)

## 2012-05-30 LAB — TSH: TSH: <0.008 u[IU]/mL — ABNORMAL LOW (ref 0.350–4.500)

## 2012-05-30 LAB — T3: T3, Total: 194.7 ng/dl (ref 80.0–204.0)

## 2012-05-30 MED ORDER — METHIMAZOLE 5 MG PO TABS: 5.0000 mg | ORAL_TABLET | Freq: Every day | ORAL | Status: DC

## 2012-05-30 MED ORDER — ATENOLOL 50 MG PO TABS: 50.0000 mg | ORAL_TABLET | Freq: Every day | ORAL | Status: DC

## 2012-05-30 MED ORDER — DSS 100 MG PO CAPS: 100.0000 mg | ORAL_CAPSULE | Freq: Two times a day (BID) | ORAL | Status: AC

## 2012-05-30 MED ORDER — ASPIRIN EC 81 MG PO TBEC: 81.0000 mg | DELAYED_RELEASE_TABLET | Freq: Every day | ORAL | Status: AC

## 2012-05-30 MED ORDER — NICOTINE 14 MG/24HR TD PT24: 1.0000 | MEDICATED_PATCH | Freq: Every day | TRANSDERMAL | Status: AC

## 2012-05-30 NOTE — Progress Notes (Signed)
Pt/family given discharge instructions, medication lists, follow up appointments, and when to call the doctor.  Pt/family verbalizes understanding. [name]    

## 2012-05-30 NOTE — Care Management Note (Signed)
    Page 1 of 1   05/30/2012     2:40:47 PM   CARE MANAGEMENT NOTE 05/30/2012  Patient:  [name]   Account Number:  [id number]  Date Initiated:  05/30/2012  Documentation initiated by:  [name]  Subjective/Objective Assessment:   PT ADM ON 05/28/12 WITH CHEST PAIN.  PTA, PT INDEPENDENT WITH ADLS.     Action/Plan:   MET WITH PT TO DISCUSS DC PLANS.  SHE STATES SHE WILL HAVE [name] FRIEND TO ASSIST AT DC.  NO HOME NEEDS, PER PT.   Anticipated DC Date:  05/30/2012   Anticipated DC Plan:  HOME/SELF CARE      DC Planning Services  CM consult      Choice offered to / List presented to:             Status of service:  Completed, signed off Medicare Important Message given?   (If response is "NO", the following Medicare IM given date fields will be blank) Date Medicare IM given:   Date Additional Medicare IM given:    Discharge Disposition:  HOME/SELF CARE  Per UR Regulation:  Reviewed for med. necessity/level of care/duration of stay  If discussed at Long Length of Stay Meetings, dates discussed:    Comments:

## 2012-05-30 NOTE — Discharge Summary (Signed)
[facility]                                                                                   JOHANNY SEGERS, is a 46 y.o. female  DOB [birth year]  MRN [id number].  Admission date:  05/28/2012  Discharge Date:  05/30/2012  Primary MD  [name], MD  Admitting Physician  Houston Siren, MD  Admission Diagnosis  Hypokalemia [276.8] Hyperthyroidism [242.90] Chest pain [786.50] CP  Discharge Diagnosis     Active Problems:  Sphincter of Oddi dysfunction  Opioid dependence  Hyperthyroidism  Chest pain at rest  Hypokalemia      Past Medical History  Diagnosis Date  . Multiple sclerosis   . Irritable bowel syndrome   . Arthritis   . Pancreatitis   . Anxiety   . Depression   . Grave's disease   . Chronic pain   . Graves disease     Past Surgical History  Procedure Date  . Sphincterotomy   . Cholecystectomy      Recommendations for primary care physician for things to follow:  Please follow on Echo results done 05/29/2012 and check CBC and LFT in 1 week as patient is started om methimazole.   Discharge Diagnoses:   Active Problems:  Sphincter of Oddi dysfunction  Opioid dependence  Hyperthyroidism  Chest pain at rest  Hypokalemia    Discharge Condition: stable   Diet recommendation: See Discharge Instructions below   Consults none   History of present illness and  Hospital Course:  See H&P, Labs, Consult and Test reports for all details, TERAH ROBEY is an 46 y.o. 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  -Hyperthyroidism : Patient had a nuclear medicine thyroid scan done in February showing increased homogeneous intake and compatible with grave,s disease, was supposed to follow with endocrinology as an outpatient but didn't keep her appointment, was started on methimazole low-dose, and atenolol for symptoms control, will follow with endocrine as an outpatient appointment was made with Dr. Talmage Nap, for this Monday.   -Hypokalemia-was replaced level on discharge date 4.9   Today   Subjective:   Vanice Rappa today has no headache,no chest abdominal pain,no new weakness tingling or numbness,   Objective:   Blood pressure 108/62, pulse 90, temperature 98.4 F (36.9 C), temperature source Oral, resp. rate 16, height 5\' 3"  (1.6 m), weight 65.908 kg (145 lb 4.8 oz), SpO2 95.00%.   Intake/Output Summary (Last 24 hours) at 05/30/12 1225 Last  data filed at 05/30/12 0150  Gross per 24 hour  Intake    720 ml  Output   1150 ml  Net   -430 ml    Exam Awake Alert, Oriented X 3, No new F.N deficits, Normal affect  [place].AT,PERRAL  Supple Neck,No JVD, No cervical lymphadenopathy appriciated.  Symmetrical Chest wall movement, Good air movement bilaterally, CTAB  RRR,No Gallops,Rubs or new Murmurs, No Parasternal Heave  +ve B.Sounds, Abd Soft, Non tender, No organomegaly appriciated, No rebound - guarding or rigidity.  No Cyanosis, Clubbing or edema, No new Rash or bruise     Data Review   Major procedures and Radiology Reports - PLEASE review detailed and final reports for all details in brief -      US Soft Tissue Head/neck  05/29/2012  *RADIOLOGY REPORT*  Clinical Data: Right lobe thyroid nodule.   THYROID ULTRASOUND  Technique: Ultrasound examination of the thyroid gland and adjacent soft tissues was performed.  Comparison:  CT of the neck 11/10/2009.  Findings:  Right thyroid lobe:  3.6 x 1.8 x 1.8 cm Left thyroid lobe:  3.7 x 1.8 x 1.3 cm Isthmus:  2 mm  Focal nodules:  The thyroid parenchyma is diffusely heterogeneous. There are no discrete nodules evident. The thyroid is hypervascular.  Lymphadenopathy:  None  IMPRESSION: Diffuse heterogeneity of the normal sized gland bilaterally. Hypervascularity is noted.  The findings are compatible with the previous diagnosis of Graves disease.  Original Report Authenticated By: Jamesetta Orleans. MATTERN, M.D.   Dg Chest Portable 1 View  05/28/2012  *RADIOLOGY REPORT*  Clinical Data: Chest pain.  CHEST - 1 VIEW  Comparison:  01/05/2011  Findings: The heart size and mediastinal contours are within normal limits.  Both lungs are clear.  IMPRESSION: No active disease.  Original Report Authenticated By: Reola Calkins, M.D.      No results found for this or any previous visit (from the past 240 hour(s)).   CBC w Diff: Lab Results  Component Value Date   WBC 6.6 05/30/2012   HGB 10.8* 05/30/2012   HCT 33.4* 05/30/2012   PLT 233 05/30/2012   LYMPHOPCT 23 05/18/2012   MONOPCT 6 05/18/2012   EOSPCT 1 05/18/2012   BASOPCT 0 05/18/2012    CMP: Lab Results  Component Value Date   NA 141 05/30/2012   K 4.9 05/30/2012   CL 110 05/30/2012   CO2 27 05/30/2012   BUN <3* 05/30/2012   CREATININE 0.46* 05/30/2012   PROT 5.5* 05/28/2012   ALBUMIN 2.4* 05/28/2012   BILITOT 0.3 05/28/2012   ALKPHOS 160* 05/28/2012   AST 20 05/28/2012   ALT 14 05/28/2012  .       Follow-up Information    Follow up with AVBUERE,EDWIN A, MD in 1 week.   Contact information:   [address] [place] [address] [phone]       Follow up with Dorisann Frames, MD in 4 days. (appointment is made for Monday 7/29 at 10:00 am)    Contact information:   [address] [place] [address] [phone]            Discharge Medications   Medication List  As of 05/30/2012 12:25 PM   START taking these medications         aspirin EC 81 MG tablet   Take 1 tablet (81 mg total) by mouth daily.      atenolol 50 MG tablet   Commonly known as: TENORMIN  Take 1 tablet (50 mg total) by mouth daily.      DSS 100 MG Caps   Take 100 mg by mouth 2 (two) times daily.      methimazole 5 MG tablet   Commonly known as: TAPAZOLE   Take 1 tablet (5 mg total) by mouth daily.      nicotine 14 mg/24hr patch   Commonly known as: NICODERM CQ - dosed in mg/24 hours   Place 1 patch onto the skin daily.         CONTINUE taking these medications         albuterol 108 (90 BASE) MCG/ACT inhaler   Commonly known as: PROVENTIL HFA;VENTOLIN HFA      buPROPion 150 MG 24 hr tablet   Commonly known as: WELLBUTRIN XL      cyclobenzaprine 10 MG tablet   Commonly known as: FLEXERIL      diclofenac 75 MG EC tablet   Commonly known as: VOLTAREN      diclofenac sodium 1 % Gel   Commonly known as: VOLTAREN      escitalopram 10 MG tablet   Commonly known as: LEXAPRO      imipramine 50 MG tablet   Commonly known as: TOFRANIL      lansoprazole 15 MG capsule   Commonly known as: PREVACID      methadone 10 MG/5ML solution   Commonly known as: DOLOPHINE      multivitamin with minerals Tabs      potassium chloride 10 MEQ tablet   Commonly known as: K-DUR,KLOR-CON         STOP taking these medications         oxyCODONE-acetaminophen 5-325 MG per tablet          Where to get your medications    These are the prescriptions that you need to pick up. We sent them to a specific pharmacy, so you will need to go there to get them.   [id number] - [place], [place] - [address]    [address] [place] [address]    Phone: [phone]        aspirin EC 81 MG  tablet   atenolol 50 MG tablet   DSS 100 MG Caps   methimazole 5 MG tablet   nicotine 14 mg/24hr patch               Total Time in preparing paper work, data evaluation and todays exam - 35 minutes  Adaysha Dubinsky M.D on 05/30/2012 at 12:25 PM  [facility] Office  [phone]

## 2012-06-13 ENCOUNTER — Other Ambulatory Visit: Payer: Self-pay | Admitting: Endocrinology

## 2012-06-13 ENCOUNTER — Other Ambulatory Visit (HOSPITAL_COMMUNITY): Payer: Self-pay | Admitting: Endocrinology

## 2012-06-13 DIAGNOSIS — E0500 Thyrotoxicosis with diffuse goiter without thyrotoxic crisis or storm: Secondary | ICD-10-CM

## 2012-06-24 ENCOUNTER — Encounter (HOSPITAL_COMMUNITY)
Admission: RE | Admit: 2012-06-24 | Discharge: 2012-06-24 | Disposition: A | Discharge status: Home / Self Care | Payer: Medicare Other | Source: Ambulatory Visit | Attending: Endocrinology | Admitting: Endocrinology

## 2012-06-24 DIAGNOSIS — E0500 Thyrotoxicosis with diffuse goiter without thyrotoxic crisis or storm: Secondary | ICD-10-CM | POA: Insufficient documentation

## 2012-06-25 ENCOUNTER — Encounter (HOSPITAL_COMMUNITY)
Admission: RE | Admit: 2012-06-25 | Discharge: 2012-06-25 | Disposition: A | Discharge status: Home / Self Care | Payer: Medicare Other | Source: Ambulatory Visit | Attending: Endocrinology | Admitting: Endocrinology

## 2012-06-25 ENCOUNTER — Other Ambulatory Visit (HOSPITAL_COMMUNITY): Payer: Self-pay | Admitting: Endocrinology

## 2012-06-25 ENCOUNTER — Encounter (HOSPITAL_COMMUNITY): Payer: Self-pay

## 2012-06-25 DIAGNOSIS — E0500 Thyrotoxicosis with diffuse goiter without thyrotoxic crisis or storm: Secondary | ICD-10-CM

## 2012-06-25 MED ORDER — SODIUM PERTECHNETATE TC 99M INJECTION
10.3000 | Freq: Once | INTRAVENOUS | Status: AC | PRN
  Administered 2012-06-25: 10.3 via INTRAVENOUS

## 2012-06-25 MED ORDER — SODIUM IODIDE I 131 CAPSULE: 15.5000 | Freq: Once | INTRAVENOUS | Status: AC | PRN

## 2012-06-28 ENCOUNTER — Encounter (HOSPITAL_COMMUNITY)
Admission: RE | Admit: 2012-06-28 | Discharge: 2012-06-28 | Disposition: A | Discharge status: Home / Self Care | Payer: Medicare Other | Source: Ambulatory Visit | Attending: Endocrinology | Admitting: Endocrinology

## 2012-06-28 DIAGNOSIS — E0500 Thyrotoxicosis with diffuse goiter without thyrotoxic crisis or storm: Secondary | ICD-10-CM

## 2012-06-28 MED ORDER — SODIUM IODIDE I 131 CAPSULE
12.1000 | Freq: Once | INTRAVENOUS | Status: AC | PRN
  Administered 2012-06-28: 12.1 via ORAL

## 2012-08-28 ENCOUNTER — Emergency Department (HOSPITAL_COMMUNITY): Payer: Medicare Other

## 2012-08-28 ENCOUNTER — Encounter (HOSPITAL_COMMUNITY): Payer: Self-pay | Admitting: Emergency Medicine

## 2012-08-28 ENCOUNTER — Emergency Department (HOSPITAL_COMMUNITY)
Admission: EM | Admit: 2012-08-28 | Discharge: 2012-08-29 | Disposition: A | Discharge status: Home / Self Care | Payer: Medicare Other | Attending: Emergency Medicine | Admitting: Emergency Medicine

## 2012-08-28 DIAGNOSIS — E876 Hypokalemia: Secondary | ICD-10-CM

## 2012-08-28 DIAGNOSIS — K859 Acute pancreatitis without necrosis or infection, unspecified: Secondary | ICD-10-CM | POA: Insufficient documentation

## 2012-08-28 DIAGNOSIS — Z8719 Personal history of other diseases of the digestive system: Secondary | ICD-10-CM | POA: Insufficient documentation

## 2012-08-28 DIAGNOSIS — R112 Nausea with vomiting, unspecified: Secondary | ICD-10-CM | POA: Insufficient documentation

## 2012-08-28 DIAGNOSIS — Z791 Long term (current) use of non-steroidal anti-inflammatories (NSAID): Secondary | ICD-10-CM | POA: Insufficient documentation

## 2012-08-28 DIAGNOSIS — R1013 Epigastric pain: Secondary | ICD-10-CM

## 2012-08-28 DIAGNOSIS — G8929 Other chronic pain: Secondary | ICD-10-CM | POA: Insufficient documentation

## 2012-08-28 DIAGNOSIS — E0500 Thyrotoxicosis with diffuse goiter without thyrotoxic crisis or storm: Secondary | ICD-10-CM | POA: Insufficient documentation

## 2012-08-28 DIAGNOSIS — Z79899 Other long term (current) drug therapy: Secondary | ICD-10-CM | POA: Insufficient documentation

## 2012-08-28 DIAGNOSIS — F411 Generalized anxiety disorder: Secondary | ICD-10-CM | POA: Insufficient documentation

## 2012-08-28 DIAGNOSIS — G35 Multiple sclerosis: Secondary | ICD-10-CM | POA: Insufficient documentation

## 2012-08-28 DIAGNOSIS — F17200 Nicotine dependence, unspecified, uncomplicated: Secondary | ICD-10-CM | POA: Insufficient documentation

## 2012-08-28 DIAGNOSIS — K589 Irritable bowel syndrome without diarrhea: Secondary | ICD-10-CM | POA: Insufficient documentation

## 2012-08-28 DIAGNOSIS — F4310 Post-traumatic stress disorder, unspecified: Secondary | ICD-10-CM | POA: Insufficient documentation

## 2012-08-28 DIAGNOSIS — Z7982 Long term (current) use of aspirin: Secondary | ICD-10-CM | POA: Insufficient documentation

## 2012-08-28 DIAGNOSIS — Z8739 Personal history of other diseases of the musculoskeletal system and connective tissue: Secondary | ICD-10-CM | POA: Insufficient documentation

## 2012-08-28 DIAGNOSIS — Z8669 Personal history of other diseases of the nervous system and sense organs: Secondary | ICD-10-CM | POA: Insufficient documentation

## 2012-08-28 HISTORY — DX: Opioid dependence, uncomplicated: F11.20

## 2012-08-28 HISTORY — DX: Opioid use, unspecified, uncomplicated: F11.90

## 2012-08-28 HISTORY — DX: Post-traumatic stress disorder, unspecified: F43.10

## 2012-08-28 HISTORY — DX: Migraine, unspecified, not intractable, without status migrainosus: G43.909

## 2012-08-28 LAB — HEPATIC FUNCTION PANEL
ALT: 15 U/L (ref 0–35)
AST: 21 U/L (ref 0–37)
Albumin: 2.5 g/dL — ABNORMAL LOW (ref 3.5–5.2)
Alkaline Phosphatase: 187 U/L — ABNORMAL HIGH (ref 39–117)
Bilirubin, Direct: <0.1 mg/dL (ref 0.0–0.3)
Total Bilirubin: 0.2 mg/dL — ABNORMAL LOW (ref 0.3–1.2)
Total Protein: 6.2 g/dL (ref 6.0–8.3)

## 2012-08-28 LAB — BASIC METABOLIC PANEL
BUN: 3 mg/dL — ABNORMAL LOW (ref 6–23)
BUN: 3 mg/dL — ABNORMAL LOW (ref 6–23)
CO2: 26 mEq/L (ref 19–32)
CO2: 29 mEq/L (ref 19–32)
Calcium: 8.8 mg/dL (ref 8.4–10.5)
Calcium: 8.9 mg/dL (ref 8.4–10.5)
Chloride: 97 mEq/L (ref 96–112)
Chloride: 98 mEq/L (ref 96–112)
Creatinine, Ser: 0.76 mg/dL (ref 0.50–1.10)
Creatinine, Ser: 0.77 mg/dL (ref 0.50–1.10)
GFR calc Af Amer: >90 mL/min (ref >90)
GFR calc Af Amer: >90 mL/min (ref >90)
GFR calc non Af Amer: >90 mL/min (ref >90)
GFR calc non Af Amer: >90 mL/min (ref >90)
Glucose, Bld: 119 mg/dL — ABNORMAL HIGH (ref 70–99)
Glucose, Bld: 103 mg/dL — ABNORMAL HIGH (ref 70–99)
Potassium: 3.9 mEq/L (ref 3.5–5.1)
Potassium: 2.9 mEq/L — ABNORMAL LOW (ref 3.5–5.1)
Sodium: 138 mEq/L (ref 135–145)
Sodium: 140 mEq/L (ref 135–145)

## 2012-08-28 LAB — POCT I-STAT TROPONIN I: Troponin i, poc: 0.01 ng/mL (ref 0.00–0.08)

## 2012-08-28 LAB — CBC
HCT: 36.4 % (ref 36.0–46.0)
Hemoglobin: 12.5 g/dL (ref 12.0–15.0)
MCH: 33.2 pg (ref 26.0–34.0)
MCHC: 34.3 g/dL (ref 30.0–36.0)
MCV: 96.6 fL (ref 78.0–100.0)
Platelets: 341 10*3/uL (ref 150–400)
RBC: 3.77 MIL/uL — ABNORMAL LOW (ref 3.87–5.11)
RDW: 15.9 % — ABNORMAL HIGH (ref 11.5–15.5)
WBC: 10.7 10*3/uL — ABNORMAL HIGH (ref 4.0–10.5)

## 2012-08-28 LAB — LIPASE, BLOOD: Lipase: 17 U/L (ref 11–59)

## 2012-08-28 MED ORDER — SODIUM CHLORIDE 0.9 % IV BOLUS (SEPSIS)
1000.0000 mL | Freq: Once | INTRAVENOUS | Status: AC
  Administered 2012-08-28: 1000 mL via INTRAVENOUS

## 2012-08-28 MED ORDER — POTASSIUM CHLORIDE 10 MEQ/100ML IV SOLN
10.0000 meq | Freq: Once | INTRAVENOUS | Status: AC
  Administered 2012-08-29: 10 meq via INTRAVENOUS
  Filled 2012-08-28: qty 100

## 2012-08-28 MED ORDER — ONDANSETRON HCL 4 MG/2ML IJ SOLN: 4.0000 mg | Freq: Once | INTRAMUSCULAR | Status: DC

## 2012-08-28 MED ORDER — HYDROMORPHONE HCL PF 1 MG/ML IJ SOLN
1.0000 mg | Freq: Once | INTRAMUSCULAR | Status: DC
  Filled 2012-08-28: qty 1

## 2012-08-28 MED ORDER — ONDANSETRON HCL 4 MG PO TABS: 4.0000 mg | ORAL_TABLET | Freq: Four times a day (QID) | ORAL | Status: DC

## 2012-08-28 MED ORDER — PANTOPRAZOLE SODIUM 20 MG PO TBEC: 40.0000 mg | DELAYED_RELEASE_TABLET | Freq: Every day | ORAL | Status: DC

## 2012-08-28 MED ORDER — POTASSIUM CHLORIDE CRYS ER 20 MEQ PO TBCR
60.0000 meq | EXTENDED_RELEASE_TABLET | Freq: Once | ORAL | Status: AC
  Administered 2012-08-29: 60 meq via ORAL
  Filled 2012-08-28: qty 3

## 2012-08-28 MED ORDER — HYDROMORPHONE HCL PF 1 MG/ML IJ SOLN
1.0000 mg | Freq: Once | INTRAMUSCULAR | Status: AC
  Administered 2012-08-28: 1 mg via INTRAVENOUS

## 2012-08-28 MED ORDER — ONDANSETRON HCL 8 MG PO TABS
4.0000 mg | ORAL_TABLET | Freq: Once | ORAL | Status: AC
  Administered 2012-08-28: 4 mg via ORAL
  Filled 2012-08-28: qty 1

## 2012-08-28 MED ORDER — HYDROMORPHONE HCL PF 1 MG/ML IJ SOLN: 1.0000 mg | Freq: Once | INTRAMUSCULAR | Status: DC

## 2012-08-28 NOTE — ED Notes (Addendum)
C/o intermittent stabbing chest pain to center and L chest over the past 2 days with nausea and vomiting.  Currently having chest pressure.  Denies sob. States it feels like heart is racing.

## 2012-08-28 NOTE — ED Notes (Signed)
Attempted PIV placement x2 without success.  IV team was paged.  Per Dorene Sorrow, RN with IV team, someone will come down and place PIV.

## 2012-08-28 NOTE — ED Notes (Signed)
Patient transported to X-ray 

## 2012-08-28 NOTE — ED Provider Notes (Signed)
History    46yf with abdominal pain and CP. Epigastric and lower sternal area. Pt has been having n/v for past week or so. Constant. Feels like burning. No appreciable exacerbating or relieving factors. No SOB. No fever or chills. S/p cholecystectomy. Pt with chronic pain issues. Opiate dependence on methadone. Hx of pancreatitis and sphincter of Oddi dysfunction. Hx of graves dz on methimazole.   CSN: [id number]  Arrival date & time 08/28/12  2138   First MD Initiated Contact with Patient 08/28/12 2153      Chief Complaint  Patient presents with  . Chest Pain    (Consider location/radiation/quality/duration/timing/severity/associated sxs/prior treatment) HPI  Past Medical History  Diagnosis Date  . Multiple sclerosis   . Irritable bowel syndrome   . Arthritis   . Pancreatitis   . Anxiety   . Depression   . Grave's disease   . Chronic pain   . Graves disease   . Migraine   . PTSD (post-traumatic stress disorder)   . Methadone use     Past Surgical History  Procedure Date  . Sphincterotomy   . Cholecystectomy     Family History  Problem Relation Age of Onset  . Cancer Mother   . Hypertension Father     History  Substance Use Topics  . Smoking status: Current Every Day Smoker -- 0.5 packs/day for 8 years  . Smokeless tobacco: Never Used  . Alcohol Use: No    OB History    Grav Para Term Preterm Abortions TAB SAB Ect Mult Living                  Review of Systems   Review of symptoms negative unless otherwise noted in HPI.   Allergies  Erythromycin and Morphine and related  Home Medications   Current Outpatient Rx  Name Route Sig Dispense Refill  . ALBUTEROL SULFATE HFA 108 (90 BASE) MCG/ACT IN AERS Inhalation Inhale 2 puffs into the lungs every 4 (four) hours as needed. For shortness of breath    . ASPIRIN EC 81 MG PO TBEC Oral Take 1 tablet (81 mg total) by mouth daily. 30 tablet 0  . ATENOLOL 50 MG PO TABS Oral Take 1 tablet (50 mg total) by  mouth daily. 30 tablet 0  . BUPROPION HCL ER (XL) 150 MG PO TB24 Oral Take 150 mg by mouth daily.    . CYCLOBENZAPRINE HCL 10 MG PO TABS Oral Take 10 mg by mouth 3 (three) times daily as needed. For muscle spasms.    . DICLOFENAC SODIUM 75 MG PO TBEC Oral Take 75 mg by mouth daily.     Marland Kitchen DICLOFENAC SODIUM 1 % TD GEL Topical Apply 1 application topically 2 (two) times daily. Knees, hips, neck    . ESCITALOPRAM OXALATE 10 MG PO TABS Oral Take 30 mg by mouth daily.    . IMIPRAMINE HCL 50 MG PO TABS Oral Take 100 mg by mouth at bedtime.     Marland Kitchen LANSOPRAZOLE 15 MG PO CPDR Oral Take 15 mg by mouth daily.    Marland Kitchen MELATONIN 3 MG PO TABS Oral Take 1 tablet by mouth at bedtime as needed. For sleep    . METHADONE HCL 10 MG/5ML PO SOLN Oral Take 83 mg by mouth daily.     Marland Kitchen METHIMAZOLE 5 MG PO TABS Oral Take 1 tablet (5 mg total) by mouth daily. 15 tablet 0  . ADULT MULTIVITAMIN W/MINERALS CH Oral Take 1 tablet by mouth daily.    Marland Kitchen  ONDANSETRON HCL 4 MG PO TABS Oral Take 4 mg by mouth every 8 (eight) hours as needed. For nausea or vomiting    . POTASSIUM CHLORIDE CRYS ER 10 MEQ PO TBCR Oral Take 10 mEq by mouth daily.      BP 123/69  Pulse 72  Temp 98.1 F (36.7 C) (Oral)  Resp 18  SpO2 100%  Physical Exam  Nursing note and vitals reviewed. Constitutional: She appears well-developed and well-nourished. No distress.  HENT:  Head: Normocephalic and atraumatic.  Eyes: Conjunctivae normal are normal. Right eye exhibits no discharge. Left eye exhibits no discharge.  Neck: Neck supple.  Cardiovascular: Normal rate, regular rhythm and normal heart sounds.  Exam reveals no gallop and no friction rub.   No murmur heard. Pulmonary/Chest: Effort normal and breath sounds normal. No respiratory distress.  Abdominal: Soft. She exhibits no distension. There is tenderness. There is no rebound and no guarding.       Mild epigastric tenderness w/ rebound or guarding  Musculoskeletal: She exhibits no edema and no  tenderness.       Lower extremities symmetric as compared to each other. No calf tenderness. Negative Homan's. No palpable cords.   Neurological: She is alert.  Skin: Skin is warm and dry.  Psychiatric: She has a normal mood and affect. Her behavior is normal. Thought content normal.    ED Course  Procedures (including critical care time)  Labs Reviewed  BASIC METABOLIC PANEL - Abnormal; Notable for the following:    Glucose, Bld 119 (*)     BUN 3 (*)     All other components within normal limits  CBC - Abnormal; Notable for the following:    WBC 10.7 (*)     RBC 3.77 (*)     RDW 15.9 (*)     All other components within normal limits  POCT I-STAT TROPONIN I  BASIC METABOLIC PANEL  LIPASE, BLOOD  HEPATIC FUNCTION PANEL   Dg Chest 2 View  08/28/2012  *RADIOLOGY REPORT*  Clinical Data: Chest pain, smoking history  CHEST - 2 VIEW  Comparison: 05/28/2012  Findings: Lungs are clear. No pleural effusion or pneumothorax. The cardiomediastinal contours are within normal limits. The visualized bones and soft tissues are without significant appreciable abnormality.  Surgical clips right upper quadrant.  IMPRESSION: No radiographic evidence of acute cardiopulmonary process.   Original Report Authenticated By: Waneta Martins, M.D.    EKG:  Rhythm: normal sinus Vent. rate 72 BPM PR interval 170 ms QRS duration 84 ms QT/QTc 464/508 ms ST segments: NS ST changes   1. Epigastric pain   2. Nausea and vomiting   3. Hypokalemia       MDM  11:32 PM Pt given a Coke which she drank without apparent difficulty. No subsequent vomiting.   41yf with epigastric/lower sternal pain. Atypical for ACS given constant duration for over 24 hours. Epigastric tenderness on exam with peritoneal signs. Reports vomiting but tolerating PO in ED. Clinically euvolemic. Labs not consistent with significant dehydration. Hypokalemia. Repleted. Possible GERD or PUD. Will give trial of PPI.      Raeford Razor, MD 08/28/12 [phone]

## 2012-08-29 NOTE — ED Notes (Signed)
Patient will be discharged when her IV Potassium infusion is complete.

## 2012-09-11 ENCOUNTER — Emergency Department (HOSPITAL_COMMUNITY)
Admission: EM | Admit: 2012-09-11 | Discharge: 2012-09-12 | Disposition: A | Discharge status: Home / Self Care | Payer: Medicare Other | Attending: Emergency Medicine | Admitting: Emergency Medicine

## 2012-09-11 ENCOUNTER — Emergency Department (HOSPITAL_COMMUNITY): Payer: Medicare Other

## 2012-09-11 ENCOUNTER — Encounter (HOSPITAL_COMMUNITY): Payer: Self-pay | Admitting: Adult Health

## 2012-09-11 DIAGNOSIS — R109 Unspecified abdominal pain: Secondary | ICD-10-CM

## 2012-09-11 DIAGNOSIS — K589 Irritable bowel syndrome without diarrhea: Secondary | ICD-10-CM | POA: Insufficient documentation

## 2012-09-11 DIAGNOSIS — R112 Nausea with vomiting, unspecified: Secondary | ICD-10-CM | POA: Insufficient documentation

## 2012-09-11 DIAGNOSIS — I872 Venous insufficiency (chronic) (peripheral): Secondary | ICD-10-CM | POA: Insufficient documentation

## 2012-09-11 DIAGNOSIS — G35 Multiple sclerosis: Secondary | ICD-10-CM | POA: Insufficient documentation

## 2012-09-11 DIAGNOSIS — E876 Hypokalemia: Secondary | ICD-10-CM | POA: Insufficient documentation

## 2012-09-11 DIAGNOSIS — F17200 Nicotine dependence, unspecified, uncomplicated: Secondary | ICD-10-CM | POA: Insufficient documentation

## 2012-09-11 DIAGNOSIS — Z79899 Other long term (current) drug therapy: Secondary | ICD-10-CM | POA: Insufficient documentation

## 2012-09-11 DIAGNOSIS — F4310 Post-traumatic stress disorder, unspecified: Secondary | ICD-10-CM | POA: Insufficient documentation

## 2012-09-11 DIAGNOSIS — M129 Arthropathy, unspecified: Secondary | ICD-10-CM | POA: Insufficient documentation

## 2012-09-11 DIAGNOSIS — R42 Dizziness and giddiness: Secondary | ICD-10-CM | POA: Insufficient documentation

## 2012-09-11 DIAGNOSIS — E0500 Thyrotoxicosis with diffuse goiter without thyrotoxic crisis or storm: Secondary | ICD-10-CM | POA: Insufficient documentation

## 2012-09-11 DIAGNOSIS — M6281 Muscle weakness (generalized): Secondary | ICD-10-CM | POA: Insufficient documentation

## 2012-09-11 DIAGNOSIS — Z8659 Personal history of other mental and behavioral disorders: Secondary | ICD-10-CM | POA: Insufficient documentation

## 2012-09-11 DIAGNOSIS — Z7982 Long term (current) use of aspirin: Secondary | ICD-10-CM | POA: Insufficient documentation

## 2012-09-11 DIAGNOSIS — R1013 Epigastric pain: Secondary | ICD-10-CM | POA: Insufficient documentation

## 2012-09-11 DIAGNOSIS — Z8719 Personal history of other diseases of the digestive system: Secondary | ICD-10-CM | POA: Insufficient documentation

## 2012-09-11 LAB — CBC WITH DIFFERENTIAL/PLATELET
Basophils Absolute: 0.1 10*3/uL (ref 0.0–0.1)
Basophils Relative: 1 % (ref 0–1)
Eosinophils Absolute: 0.1 10*3/uL (ref 0.0–0.7)
Lymphs Abs: 3.9 10*3/uL (ref 0.7–4.0)
MCH: 33 pg (ref 26.0–34.0)
MCHC: 33.8 g/dL (ref 30.0–36.0)
Neutrophils Relative %: 47 % (ref 43–77)
Platelets: 289 10*3/uL (ref 150–400)
RBC: 4.03 MIL/uL (ref 3.87–5.11)
RDW: 15.6 % — ABNORMAL HIGH (ref 11.5–15.5)

## 2012-09-11 LAB — COMPREHENSIVE METABOLIC PANEL
ALT: 20 U/L (ref 0–35)
AST: 34 U/L (ref 0–37)
Albumin: 2.7 g/dL — ABNORMAL LOW (ref 3.5–5.2)
Alkaline Phosphatase: 174 U/L — ABNORMAL HIGH (ref 39–117)
Calcium: 8.9 mg/dL (ref 8.4–10.5)
Potassium: 3 mEq/L — ABNORMAL LOW (ref 3.5–5.1)
Sodium: 139 mEq/L (ref 135–145)
Total Protein: 6.2 g/dL (ref 6.0–8.3)

## 2012-09-11 MED ORDER — SODIUM CHLORIDE 0.9 % IV SOLN
INTRAVENOUS | Status: DC
  Administered 2012-09-12: 02:00:00 via INTRAVENOUS

## 2012-09-11 MED ORDER — ONDANSETRON HCL 4 MG/2ML IJ SOLN
4.0000 mg | Freq: Once | INTRAMUSCULAR | Status: AC
  Administered 2012-09-12: 4 mg via INTRAVENOUS
  Filled 2012-09-11: qty 2

## 2012-09-11 NOTE — ED Notes (Addendum)
Presents with abdominal pain in the pper right quadrant described as achy and shooting-pain radiates to back  Associated with weakness, dizziness, nausea, vomiting, difficulty getting words out and right sided weakness for one to two weeks. Reports inability to keep food down, and dizziness is worse with change of position. No drift or droop noted, grips equal bilaterally. Pt is alert and answers all questions appropriately.

## 2012-09-12 MED ORDER — HYDROMORPHONE HCL PF 1 MG/ML IJ SOLN
1.0000 mg | Freq: Once | INTRAMUSCULAR | Status: AC
  Administered 2012-09-12: 1 mg via INTRAVENOUS
  Filled 2012-09-12: qty 1

## 2012-09-12 MED ORDER — PROMETHAZINE HCL 25 MG PO TABS: 25.0000 mg | ORAL_TABLET | Freq: Four times a day (QID) | ORAL | Status: DC | PRN

## 2012-09-12 MED ORDER — SODIUM CHLORIDE 0.9 % IV BOLUS (SEPSIS)
1000.0000 mL | Freq: Once | INTRAVENOUS | Status: AC
  Administered 2012-09-12: 1000 mL via INTRAVENOUS

## 2012-09-12 MED ORDER — POTASSIUM CHLORIDE CRYS ER 20 MEQ PO TBCR
40.0000 meq | EXTENDED_RELEASE_TABLET | Freq: Once | ORAL | Status: AC
  Administered 2012-09-12: 40 meq via ORAL
  Filled 2012-09-12: qty 2

## 2012-09-12 MED ORDER — SILVER SULFADIAZINE 1 % EX CREA
TOPICAL_CREAM | Freq: Once | CUTANEOUS | Status: AC
  Administered 2012-09-12: 04:00:00 via TOPICAL
  Filled 2012-09-12: qty 85

## 2012-09-12 NOTE — ED Provider Notes (Signed)
History     CSN: [id number]  Arrival date & time 09/11/12  2107   First MD Initiated Contact with Patient 09/11/12 2255      Chief Complaint  Patient presents with  . Abdominal Pain  . Weakness    (Consider location/radiation/quality/duration/timing/severity/associated sxs/prior treatment) HPI HX per PT, has PMH of pancreatitis, chronic pain and N/V.  She has been having these chronic symptoms last few days and at home tonight started feeling dizzy. Typically she tries to drink clear liquids and usually symptoms reside, tonight not doing any better despite these measures and presents here for evaluation. No F/C, no trauma, no radiation of sharp epigastric ABd pain. MOD in severity.  Past Medical History  Diagnosis Date  . Multiple sclerosis   . Irritable bowel syndrome   . Arthritis   . Pancreatitis   . Anxiety   . Depression   . Grave's disease   . Chronic pain   . Graves disease   . Migraine   . PTSD (post-traumatic stress disorder)   . Methadone use     Past Surgical History  Procedure Date  . Sphincterotomy   . Cholecystectomy     Family History  Problem Relation Age of Onset  . Cancer Mother   . Hypertension Father     History  Substance Use Topics  . Smoking status: Current Every Day Smoker -- 0.5 packs/day for 8 years  . Smokeless tobacco: Never Used  . Alcohol Use: No    OB History    Grav Para Term Preterm Abortions TAB SAB Ect Mult Living                  Review of Systems  Constitutional: Negative for fever and chills.  HENT: Negative for neck pain and neck stiffness.   Eyes: Negative for pain.  Respiratory: Negative for shortness of breath.   Cardiovascular: Negative for chest pain.  Gastrointestinal: Positive for nausea, vomiting and abdominal pain.  Genitourinary: Negative for dysuria.  Musculoskeletal: Negative for back pain.  Skin: Negative for rash.  Neurological: Negative for headaches.  All other systems reviewed and are  negative.    Allergies  Erythromycin and Morphine and related  Home Medications   Current Outpatient Rx  Name  Route  Sig  Dispense  Refill  . ALBUTEROL SULFATE HFA 108 (90 BASE) MCG/ACT IN AERS   Inhalation   Inhale 2 puffs into the lungs every 4 (four) hours as needed. For shortness of breath         . ASPIRIN EC 81 MG PO TBEC   Oral   Take 1 tablet (81 mg total) by mouth daily.   30 tablet   0   . BUPROPION HCL ER (XL) 150 MG PO TB24   Oral   Take 150 mg by mouth daily.         . CYCLOBENZAPRINE HCL 10 MG PO TABS   Oral   Take 10 mg by mouth 3 (three) times daily as needed. For muscle spasms.         . DICLOFENAC SODIUM 75 MG PO TBEC   Oral   Take 75 mg by mouth daily.          Marland Kitchen DICLOFENAC SODIUM 1 % TD GEL   Topical   Apply 1 application topically 2 (two) times daily. Knees, hips, neck         . ESCITALOPRAM OXALATE 10 MG PO TABS   Oral   Take 30 mg  by mouth daily.         . IMIPRAMINE HCL 50 MG PO TABS   Oral   Take 100 mg by mouth at bedtime.          Marland Kitchen LANSOPRAZOLE 15 MG PO CPDR   Oral   Take 15 mg by mouth daily.         Marland Kitchen LEVOTHYROXINE SODIUM 25 MCG PO TABS   Oral   Take 25 mcg by mouth daily.         Marland Kitchen MELATONIN 3 MG PO TABS   Oral   Take 1 tablet by mouth at bedtime as needed. For sleep         . METHADONE HCL 10 MG/5ML PO SOLN   Oral   Take 83 mg by mouth daily.          . ADULT MULTIVITAMIN W/MINERALS CH   Oral   Take 1 tablet by mouth daily.         Marland Kitchen ONDANSETRON HCL 4 MG PO TABS   Oral   Take 1 tablet (4 mg total) by mouth every 6 (six) hours.   12 tablet   0   . POTASSIUM CHLORIDE CRYS ER 10 MEQ PO TBCR   Oral   Take 10 mEq by mouth daily.           BP 124/71  Pulse 77  Temp 98.1 F (36.7 C) (Oral)  Resp 20  SpO2 100%  Physical Exam  Constitutional: She is oriented to person, place, and time. She appears well-developed and well-nourished.  HENT:  Head: Normocephalic and atraumatic.    Eyes: Conjunctivae normal and EOM are normal. Pupils are equal, round, and reactive to light.  Neck: Trachea normal. Neck supple. No thyromegaly present.  Cardiovascular: Normal rate, regular rhythm, S1 normal, S2 normal and normal pulses.     No systolic murmur is present   No diastolic murmur is present  Pulses:      Radial pulses are 2+ on the right side, and 2+ on the left side.  Pulmonary/Chest: Effort normal and breath sounds normal. She has no wheezes. She has no rhonchi. She has no rales. She exhibits no tenderness.  Abdominal: Soft. Normal appearance and bowel sounds are normal. She exhibits no distension. There is no rebound, no guarding, no CVA tenderness and negative Murphy's sign.       TTP Epigastric with mild RUQ tenderness, no peritonitis.  Musculoskeletal:       BLE:s Calves nontender, no cords or erythema, negative Homans sign  Neurological: She is alert and oriented to person, place, and time. She has normal strength. No cranial nerve deficit or sensory deficit. GCS eye subscore is 4. GCS verbal subscore is 5. GCS motor subscore is 6.  Skin: Skin is warm and dry. No rash noted. She is not diaphoretic.  Psychiatric: Her speech is normal.       Cooperative and appropriate    ED Course  Angiocath insertion Date/Time: 09/12/2012 1:12 AM Performed by: Sunnie Nielsen Authorized by: Sunnie Nielsen Consent: Verbal consent obtained. Risks and benefits: risks, benefits and alternatives were discussed Consent given by: patient Patient understanding: patient states understanding of the procedure being performed Patient consent: the patient's understanding of the procedure matches consent given Procedure consent: procedure consent matches procedure scheduled Required items: required blood products, implants, devices, and special equipment available Patient identity confirmed: verbally with patient Time out: Immediately prior to procedure a "time out" was called to verify the  correct patient, procedure, equipment, support staff and site/side marked as required. Preparation: Patient was prepped and draped in the usual sterile fashion. Patient tolerance: Patient tolerated the procedure well with no immediate complications. Comments: 20 G IV placed without difficulty Right-sided EJ. Blood aspirated and saline flushed, secured in place. PT tolerated well.    (including critical care time)  Results for orders placed during the hospital encounter of 09/11/12  CBC WITH DIFFERENTIAL      Component Value Range   WBC 8.7  4.0 - 10.5 K/uL   RBC 4.03  3.87 - 5.11 MIL/uL   Hemoglobin 13.3  12.0 - 15.0 g/dL   HCT 16.1  09.6 - 04.5 %   MCV 97.8  78.0 - 100.0 fL   MCH 33.0  26.0 - 34.0 pg   MCHC 33.8  30.0 - 36.0 g/dL   RDW 40.9 (*) 81.1 - 91.4 %   Platelets 289  150 - 400 K/uL   Neutrophils Relative 47  43 - 77 %   Neutro Abs 4.1  1.7 - 7.7 K/uL   Lymphocytes Relative 45  12 - 46 %   Lymphs Abs 3.9  0.7 - 4.0 K/uL   Monocytes Relative 6  3 - 12 %   Monocytes Absolute 0.5  0.1 - 1.0 K/uL   Eosinophils Relative 2  0 - 5 %   Eosinophils Absolute 0.1  0.0 - 0.7 K/uL   Basophils Relative 1  0 - 1 %   Basophils Absolute 0.1  0.0 - 0.1 K/uL  COMPREHENSIVE METABOLIC PANEL      Component Value Range   Sodium 139  135 - 145 mEq/L   Potassium 3.0 (*) 3.5 - 5.1 mEq/L   Chloride 98  96 - 112 mEq/L   CO2 31  19 - 32 mEq/L   Glucose, Bld 75  70 - 99 mg/dL   BUN 4 (*) 6 - 23 mg/dL   Creatinine, Ser 7.82  0.50 - 1.10 mg/dL   Calcium 8.9  8.4 - 95.6 mg/dL   Total Protein 6.2  6.0 - 8.3 g/dL   Albumin 2.7 (*) 3.5 - 5.2 g/dL   AST 34  0 - 37 U/L   ALT 20  0 - 35 U/L   Alkaline Phosphatase 174 (*) 39 - 117 U/L   Total Bilirubin 0.2 (*) 0.3 - 1.2 mg/dL   GFR calc non Af Amer 76 (*) >90 mL/min   GFR calc Af Amer 88 (*) >90 mL/min  LIPASE, BLOOD      Component Value Range   Lipase 23  11 - 59 U/L    US Abdomen Complete  09/12/2012  *RADIOLOGY REPORT*  Clinical Data:  Right  upper quadrant abdominal pain.  Prior cholecystectomy.  COMPLETE ABDOMINAL ULTRASOUND  Comparison:  CT abdomen pelvis 08/02/2010.  Findings:  Gallbladder:  Surgically absent.  Common bile duct:  Normal in caliber post cholecystectomy measuring up to approximately 7 mm diameter.  Liver:  Diffusely increased and coarsened echotexture without focal parenchymal abnormality.  Patent portal vein with hepatopetal flow.  IVC:  Patent.  Pancreas:  Although the pancreas is difficult to visualize in its entirety, no focal pancreatic abnormality is identified.  Spleen:  Normal size and echotexture without focal parenchymal abnormality.  Right Kidney:  Atrophic with diffuse cortical thinning, measuring approximately 7.5 cm in length.  No hydronephrosis.  No focal parenchymal abnormality.  No shadowing calculi.  Left Kidney:  No hydronephrosis.  Well-preserved cortex.  No shadowing calculi.  Normal size and parenchymal echotexture without focal abnormalities.  Approximately 9.9 cm in length.  Abdominal aorta:  Normal in caliber throughout its visualized course in the abdomen without significant atherosclerosis.  IMPRESSION:  1.  No evidence of biliary ductal dilation post cholecystectomy. 2.  Diffuse hepatic steatosis and/or hepatocellular disease without focal hepatic parenchymal abnormality. 3.  Atrophic right kidney.   Original Report Authenticated By: Hulan Saas, M.D.     Date: 09/12/2012  Rate: 85  Rhythm: normal sinus rhythm  QRS Axis: normal  Intervals: normal  ST/T Wave abnormalities: nonspecific ST changes  Conduction Disutrbances:none  Narrative Interpretation:   Old EKG Reviewed: unchanged  Poor IV access. Required EJ.   IVFs. IV Dilaudid. IV zofran.   Serial ABd exams tenderness improved/ no acute ABD. [place] and labs reviewed. Potassium provided, tolerated POs.   MDM   Chronic ABD pain with acute exacerbation, improved as above, labs reviewed. IVFs and medications, stable for d/c home serial ABD  exams.         Sunnie Nielsen, MD 09/12/12 [phone]

## 2012-09-12 NOTE — ED Notes (Signed)
IV team paged.  

## 2012-09-12 NOTE — ED Notes (Signed)
Patient transported to Ultrasound 

## 2012-09-12 NOTE — ED Notes (Signed)
Unsuccessful IV starts x2

## 2012-09-12 NOTE — ED Notes (Signed)
Second RN with unsuccessful IV starts x2

## 2012-11-28 ENCOUNTER — Encounter (HOSPITAL_COMMUNITY): Payer: Self-pay | Admitting: Emergency Medicine

## 2012-11-28 DIAGNOSIS — R112 Nausea with vomiting, unspecified: Secondary | ICD-10-CM | POA: Insufficient documentation

## 2012-11-28 DIAGNOSIS — Z7982 Long term (current) use of aspirin: Secondary | ICD-10-CM | POA: Insufficient documentation

## 2012-11-28 DIAGNOSIS — F17200 Nicotine dependence, unspecified, uncomplicated: Secondary | ICD-10-CM | POA: Insufficient documentation

## 2012-11-28 DIAGNOSIS — R1011 Right upper quadrant pain: Secondary | ICD-10-CM | POA: Insufficient documentation

## 2012-11-28 DIAGNOSIS — Z8679 Personal history of other diseases of the circulatory system: Secondary | ICD-10-CM | POA: Insufficient documentation

## 2012-11-28 DIAGNOSIS — Z8739 Personal history of other diseases of the musculoskeletal system and connective tissue: Secondary | ICD-10-CM | POA: Insufficient documentation

## 2012-11-28 DIAGNOSIS — Z8719 Personal history of other diseases of the digestive system: Secondary | ICD-10-CM | POA: Insufficient documentation

## 2012-11-28 DIAGNOSIS — Z8639 Personal history of other endocrine, nutritional and metabolic disease: Secondary | ICD-10-CM | POA: Insufficient documentation

## 2012-11-28 DIAGNOSIS — F1110 Opioid abuse, uncomplicated: Secondary | ICD-10-CM | POA: Insufficient documentation

## 2012-11-28 DIAGNOSIS — F329 Major depressive disorder, single episode, unspecified: Secondary | ICD-10-CM | POA: Insufficient documentation

## 2012-11-28 DIAGNOSIS — E860 Dehydration: Secondary | ICD-10-CM | POA: Insufficient documentation

## 2012-11-28 DIAGNOSIS — F3289 Other specified depressive episodes: Secondary | ICD-10-CM | POA: Insufficient documentation

## 2012-11-28 DIAGNOSIS — G8929 Other chronic pain: Secondary | ICD-10-CM | POA: Insufficient documentation

## 2012-11-28 DIAGNOSIS — Z8669 Personal history of other diseases of the nervous system and sense organs: Secondary | ICD-10-CM | POA: Insufficient documentation

## 2012-11-28 DIAGNOSIS — Z79899 Other long term (current) drug therapy: Secondary | ICD-10-CM | POA: Insufficient documentation

## 2012-11-28 DIAGNOSIS — F411 Generalized anxiety disorder: Secondary | ICD-10-CM | POA: Insufficient documentation

## 2012-11-28 DIAGNOSIS — Z862 Personal history of diseases of the blood and blood-forming organs and certain disorders involving the immune mechanism: Secondary | ICD-10-CM | POA: Insufficient documentation

## 2012-11-28 DIAGNOSIS — F4310 Post-traumatic stress disorder, unspecified: Secondary | ICD-10-CM | POA: Insufficient documentation

## 2012-11-28 LAB — CBC WITH DIFFERENTIAL/PLATELET
Eosinophils Absolute: 0.1 10*3/uL (ref 0.0–0.7)
Eosinophils Relative: 1 % (ref 0–5)
HCT: 39 % (ref 36.0–46.0)
Lymphs Abs: 2.7 10*3/uL (ref 0.7–4.0)
MCH: 33.9 pg (ref 26.0–34.0)
MCV: 101 fL — ABNORMAL HIGH (ref 78.0–100.0)
Monocytes Absolute: 0.5 10*3/uL (ref 0.1–1.0)
Platelets: 252 10*3/uL (ref 150–400)
RDW: 14.4 % (ref 11.5–15.5)

## 2012-11-28 LAB — COMPREHENSIVE METABOLIC PANEL
AST: 48 U/L — ABNORMAL HIGH (ref 0–37)
Albumin: 2.6 g/dL — ABNORMAL LOW (ref 3.5–5.2)
Alkaline Phosphatase: 161 U/L — ABNORMAL HIGH (ref 39–117)
Chloride: 99 mEq/L (ref 96–112)
Potassium: 3.5 mEq/L (ref 3.5–5.1)
Sodium: 141 mEq/L (ref 135–145)
Total Bilirubin: 0.3 mg/dL (ref 0.3–1.2)
Total Protein: 6 g/dL (ref 6.0–8.3)

## 2012-11-28 LAB — LIPASE, BLOOD: Lipase: 29 U/L (ref 11–59)

## 2012-11-28 NOTE — ED Notes (Signed)
PT. REPORTS RUQ PAIN WITH VOMITTING FOR SEVERAL DAYS , DENIES DIARRHEA. NO DYSURIA OR FEVER. STATES HISTORY OF PANCREATITIS.

## 2012-11-29 ENCOUNTER — Emergency Department (HOSPITAL_COMMUNITY)
Admission: EM | Admit: 2012-11-29 | Discharge: 2012-11-29 | Disposition: A | Discharge status: Home / Self Care | Payer: Medicare Other | Attending: Emergency Medicine | Admitting: Emergency Medicine

## 2012-11-29 DIAGNOSIS — R112 Nausea with vomiting, unspecified: Secondary | ICD-10-CM

## 2012-11-29 DIAGNOSIS — R1010 Upper abdominal pain, unspecified: Secondary | ICD-10-CM

## 2012-11-29 DIAGNOSIS — E860 Dehydration: Secondary | ICD-10-CM

## 2012-11-29 LAB — URINALYSIS, ROUTINE W REFLEX MICROSCOPIC
Glucose, UA: NEGATIVE mg/dL
Specific Gravity, Urine: 1.003 — ABNORMAL LOW (ref 1.005–1.030)
pH: 7 (ref 5.0–8.0)

## 2012-11-29 LAB — URINE MICROSCOPIC-ADD ON

## 2012-11-29 MED ORDER — PROMETHAZINE HCL 25 MG PO TABS: 25.0000 mg | ORAL_TABLET | Freq: Four times a day (QID) | ORAL | Status: DC | PRN

## 2012-11-29 MED ORDER — ONDANSETRON HCL 4 MG/2ML IJ SOLN: 4.0000 mg | Freq: Once | INTRAMUSCULAR | Status: DC

## 2012-11-29 MED ORDER — SODIUM CHLORIDE 0.9 % IV BOLUS (SEPSIS): 1000.0000 mL | Freq: Once | INTRAVENOUS | Status: DC

## 2012-11-29 MED ORDER — PROMETHAZINE HCL 25 MG RE SUPP: 25.0000 mg | Freq: Four times a day (QID) | RECTAL | Status: DC | PRN

## 2012-11-29 NOTE — ED Provider Notes (Signed)
History     CSN: [id number]  Arrival date & time 11/28/12  2014   First MD Initiated Contact with Patient 11/29/12 0021      Chief Complaint  Patient presents with  . Abdominal Pain    (Consider location/radiation/quality/duration/timing/severity/associated sxs/prior treatment) HPI 47 -year-old female presents emergency apartment complaining of several days of generalized nausea with vomiting. Patient is concerned that she has return of her pancreatitis. 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 She reports she's had elevated liver enzymes and pancreatitis. Patient is status post cholecystectomy. She denies any fever. She is having normal bowel movements. The pain is worse after he eating. It is described as dull ache across her upper abdomen. She's been able to take her medications. She reports she is still nauseated despite taking her Zofran. Patient has gastroenterologist that she sees, and has followup scheduled later next week.  Past Medical History  Diagnosis Date  . Multiple sclerosis   . Irritable bowel syndrome   . Arthritis   . Pancreatitis   . Anxiety   . Depression   . Grave's disease   . Chronic pain   . Graves disease   . Migraine   . PTSD (post-traumatic stress disorder)   . Methadone use     Past Surgical History  Procedure Date  . Sphincterotomy   . Cholecystectomy     Family History  Problem Relation Age of Onset  . Cancer Mother   . Hypertension Father     History  Substance Use Topics  . Smoking status: Current Every Day Smoker -- 0.5 packs/day for 8 years  . Smokeless tobacco: Never Used  . Alcohol Use: No    OB History    Grav Para Term Preterm Abortions TAB SAB Ect Mult Living                  Review of Systems  All other systems reviewed and are negative.    Allergies  Erythromycin and Morphine and related  Home Medications   Current Outpatient Rx  Name  Route  Sig   Dispense  Refill  . ALBUTEROL SULFATE HFA 108 (90 BASE) MCG/ACT IN AERS   Inhalation   Inhale 2 puffs into the lungs every 4 (four) hours as needed. For shortness of breath         . ASPIRIN EC 81 MG PO TBEC   Oral   Take 1 tablet (81 mg total) by mouth daily.   30 tablet   0   . BUPROPION HCL ER (XL) 150 MG PO TB24   Oral   Take 150 mg by mouth daily.         . CYCLOBENZAPRINE HCL 10 MG PO TABS   Oral   Take 10 mg by mouth 3 (three) times daily as needed. For muscle spasms.         . DICLOFENAC SODIUM 75 MG PO TBEC   Oral   Take 75 mg by mouth daily.          Marland Kitchen DICLOFENAC SODIUM 1 % TD GEL   Topical   Apply 1 application topically 2 (two) times daily. Knees, hips, neck         . ESCITALOPRAM OXALATE 10 MG PO TABS   Oral   Take 30 mg by mouth daily.         . IMIPRAMINE HCL 50 MG PO TABS   Oral  Take 100 mg by mouth at bedtime.          Marland Kitchen LANSOPRAZOLE 15 MG PO CPDR   Oral   Take 15 mg by mouth daily.         Marland Kitchen LEVOTHYROXINE SODIUM 25 MCG PO TABS   Oral   Take 25 mcg by mouth daily.         Marland Kitchen MELATONIN 3 MG PO TABS   Oral   Take 1 tablet by mouth at bedtime as needed. For sleep         . METHADONE HCL 10 MG/5ML PO SOLN   Oral   Take 83 mg by mouth daily.          . ADULT MULTIVITAMIN W/MINERALS CH   Oral   Take 1 tablet by mouth daily.         Marland Kitchen ONDANSETRON HCL 4 MG PO TABS   Oral   Take 1 tablet (4 mg total) by mouth every 6 (six) hours.   12 tablet   0   . POTASSIUM CHLORIDE CRYS ER 10 MEQ PO TBCR   Oral   Take 10 mEq by mouth daily.         Marland Kitchen PROMETHAZINE HCL 25 MG PO TABS   Oral   Take 1 tablet (25 mg total) by mouth every 6 (six) hours as needed for nausea.   30 tablet   0     BP 117/77  Pulse 95  Temp 98.5 F (36.9 C) (Oral)  Resp 14  SpO2 98%  Physical Exam  Nursing note and vitals reviewed. Constitutional: She is oriented to person, place, and time. She appears well-developed and well-nourished.        Chronically ill-appearing, pale  HENT:  Head: Normocephalic and atraumatic.  Nose: Nose normal.  Mouth/Throat: Oropharynx is clear and moist.  Eyes: Conjunctivae normal and EOM are normal. Pupils are equal, round, and reactive to light.  Neck: Normal range of motion. Neck supple. No JVD present. No tracheal deviation present. No thyromegaly present.  Cardiovascular: Normal rate, regular rhythm, normal heart sounds and intact distal pulses.  Exam reveals no gallop and no friction rub.   No murmur heard. Pulmonary/Chest: Effort normal and breath sounds normal. No stridor. No respiratory distress. She has no wheezes. She has no rales. She exhibits no tenderness.  Abdominal: Soft. Bowel sounds are normal. She exhibits no distension and no mass. There is tenderness (tenderness across the upper abdomenmild tenderness across the upper abdomen). There is no rebound and no guarding.  Musculoskeletal: Normal range of motion. She exhibits no edema and no tenderness.  Lymphadenopathy:    She has no cervical adenopathy.  Neurological: She is alert and oriented to person, place, and time. She exhibits normal muscle tone. Coordination normal.  Skin: Skin is warm and dry. No rash noted. No erythema. No pallor.  Psychiatric: She has a normal mood and affect. Her behavior is normal. Judgment and thought content normal.    ED Course  Procedures (including critical care time)  Labs Reviewed  URINALYSIS, ROUTINE W REFLEX MICROSCOPIC - Abnormal; Notable for the following:    APPearance CLOUDY (*)     Specific Gravity, Urine 1.003 (*)     Leukocytes, UA TRACE (*)     All other components within normal limits  COMPREHENSIVE METABOLIC PANEL - Abnormal; Notable for the following:    CO2 33 (*)     BUN 4 (*)     Albumin 2.6 (*)  AST 48 (*)     Alkaline Phosphatase 161 (*)     All other components within normal limits  CBC WITH DIFFERENTIAL - Abnormal; Notable for the following:    RBC 3.86 (*)     MCV  101.0 (*)     All other components within normal limits  URINE MICROSCOPIC-ADD ON - Abnormal; Notable for the following:    Squamous Epithelial / LPF MANY (*)     Bacteria, UA MANY (*)     All other components within normal limits  LIPASE, BLOOD  LAB REPORT - SCANNED   No results found.   1. Upper abdominal pain   2. Nausea and vomiting   3. Dehydration       MDM  47 year old female with several days of diffuse upper abdominal pain with nausea and vomiting. She was concerned for return of pancreatitis. Labs reassuring. Patient is a difficult IV stick, and does not wish to stay for IV fluids. She does not appear overwhelmingly dehydrated and is not tachycardic. Will prescribe her rectal Phenergan, along with her Zofran and oral Phenergan. She has good followup with her gastroenterologist.        Olivia Mackie, MD 11/29/12 [phone]

## 2012-11-29 NOTE — ED Notes (Signed)
Pt refused further care/pt stated that she had Zofran and Phenergan at home and could stay hydrated on her own; pt was asked several times to reconsider but refused; Dr. Norlene Campbell notified

## 2012-12-14 ENCOUNTER — Encounter (HOSPITAL_COMMUNITY): Payer: Self-pay | Admitting: Emergency Medicine

## 2012-12-14 ENCOUNTER — Emergency Department (HOSPITAL_COMMUNITY)
Admission: EM | Admit: 2012-12-14 | Discharge: 2012-12-14 | Disposition: A | Discharge status: Home / Self Care | Payer: Medicare Other | Attending: Emergency Medicine | Admitting: Emergency Medicine

## 2012-12-14 DIAGNOSIS — K589 Irritable bowel syndrome without diarrhea: Secondary | ICD-10-CM | POA: Insufficient documentation

## 2012-12-14 DIAGNOSIS — F411 Generalized anxiety disorder: Secondary | ICD-10-CM | POA: Insufficient documentation

## 2012-12-14 DIAGNOSIS — Z79899 Other long term (current) drug therapy: Secondary | ICD-10-CM | POA: Insufficient documentation

## 2012-12-14 DIAGNOSIS — R1013 Epigastric pain: Secondary | ICD-10-CM | POA: Insufficient documentation

## 2012-12-14 DIAGNOSIS — F329 Major depressive disorder, single episode, unspecified: Secondary | ICD-10-CM | POA: Insufficient documentation

## 2012-12-14 DIAGNOSIS — E860 Dehydration: Secondary | ICD-10-CM

## 2012-12-14 DIAGNOSIS — F17200 Nicotine dependence, unspecified, uncomplicated: Secondary | ICD-10-CM | POA: Insufficient documentation

## 2012-12-14 DIAGNOSIS — M129 Arthropathy, unspecified: Secondary | ICD-10-CM | POA: Insufficient documentation

## 2012-12-14 DIAGNOSIS — G35 Multiple sclerosis: Secondary | ICD-10-CM | POA: Insufficient documentation

## 2012-12-14 DIAGNOSIS — Z8659 Personal history of other mental and behavioral disorders: Secondary | ICD-10-CM | POA: Insufficient documentation

## 2012-12-14 DIAGNOSIS — F1120 Opioid dependence, uncomplicated: Secondary | ICD-10-CM | POA: Insufficient documentation

## 2012-12-14 DIAGNOSIS — Z7982 Long term (current) use of aspirin: Secondary | ICD-10-CM | POA: Insufficient documentation

## 2012-12-14 DIAGNOSIS — E0500 Thyrotoxicosis with diffuse goiter without thyrotoxic crisis or storm: Secondary | ICD-10-CM | POA: Insufficient documentation

## 2012-12-14 DIAGNOSIS — F3289 Other specified depressive episodes: Secondary | ICD-10-CM | POA: Insufficient documentation

## 2012-12-14 DIAGNOSIS — Z8719 Personal history of other diseases of the digestive system: Secondary | ICD-10-CM | POA: Insufficient documentation

## 2012-12-14 DIAGNOSIS — G8929 Other chronic pain: Secondary | ICD-10-CM

## 2012-12-14 DIAGNOSIS — Z8679 Personal history of other diseases of the circulatory system: Secondary | ICD-10-CM | POA: Insufficient documentation

## 2012-12-14 LAB — CBC WITH DIFFERENTIAL/PLATELET
Basophils Absolute: 0 10*3/uL (ref 0.0–0.1)
Eosinophils Relative: 0 % (ref 0–5)
HCT: 45.4 % (ref 36.0–46.0)
Hemoglobin: 15.7 g/dL — ABNORMAL HIGH (ref 12.0–15.0)
Lymphocytes Relative: 19 % (ref 12–46)
Lymphs Abs: 2 10*3/uL (ref 0.7–4.0)
MCV: 101.3 fL — ABNORMAL HIGH (ref 78.0–100.0)
Monocytes Absolute: 0.7 10*3/uL (ref 0.1–1.0)
Monocytes Relative: 7 % (ref 3–12)
Neutro Abs: 7.6 10*3/uL (ref 1.7–7.7)
RDW: 14.4 % (ref 11.5–15.5)
WBC: 10.4 10*3/uL (ref 4.0–10.5)

## 2012-12-14 LAB — COMPREHENSIVE METABOLIC PANEL
BUN: 5 mg/dL — ABNORMAL LOW (ref 6–23)
CO2: 34 mEq/L — ABNORMAL HIGH (ref 19–32)
Calcium: 10 mg/dL (ref 8.4–10.5)
Chloride: 95 mEq/L — ABNORMAL LOW (ref 96–112)
Creatinine, Ser: 0.61 mg/dL (ref 0.50–1.10)
GFR calc Af Amer: >90 mL/min (ref >90)
GFR calc non Af Amer: >90 mL/min (ref >90)
Glucose, Bld: 90 mg/dL (ref 70–99)
Total Bilirubin: 0.6 mg/dL (ref 0.3–1.2)

## 2012-12-14 LAB — URINALYSIS, ROUTINE W REFLEX MICROSCOPIC
Glucose, UA: NEGATIVE mg/dL
Ketones, ur: NEGATIVE mg/dL
Protein, ur: NEGATIVE mg/dL
Urobilinogen, UA: 4 mg/dL — ABNORMAL HIGH (ref 0.0–1.0)

## 2012-12-14 LAB — LIPASE, BLOOD: Lipase: 57 U/L (ref 11–59)

## 2012-12-14 LAB — URINE MICROSCOPIC-ADD ON

## 2012-12-14 MED ORDER — SODIUM CHLORIDE 0.9 % IV BOLUS (SEPSIS)
1000.0000 mL | Freq: Once | INTRAVENOUS | Status: AC
  Administered 2012-12-14: 1000 mL via INTRAVENOUS

## 2012-12-14 MED ORDER — HYDROMORPHONE HCL PF 1 MG/ML IJ SOLN
1.0000 mg | Freq: Once | INTRAMUSCULAR | Status: AC
  Administered 2012-12-14: 1 mg via INTRAVENOUS
  Filled 2012-12-14: qty 1

## 2012-12-14 NOTE — ED Notes (Signed)
Pt c/o upper abdominal pain with n/v x 3 months. Pt reports seen here in past for same. Pt denies diarrhea.

## 2012-12-14 NOTE — ED Provider Notes (Signed)
History     CSN: [id number]  Arrival date & time 12/14/12  1737   First MD Initiated Contact with Patient 12/14/12 2107      Chief Complaint  Patient presents with  . Abdominal Pain  . Nausea  . Emesis    (Consider location/radiation/quality/duration/timing/severity/associated sxs/prior treatment) Patient is a 47 y.o. female presenting with abdominal pain and vomiting. The history is provided by the patient.  Abdominal Pain Pain location:  Epigastric Pain quality: sharp   Pain radiates to:  Does not radiate Pain severity:  Moderate Onset quality:  Gradual Timing:  Constant Progression:  Worsening Chronicity:  Chronic Relieved by:  Nothing Worsened by:  Palpation and movement Associated symptoms: vomiting   Associated symptoms: no chest pain, no diarrhea, no fatigue, no fever, no nausea and no shortness of breath   Emesis Severity:  Mild Timing:  Intermittent Quality:  Stomach contents Associated symptoms: abdominal pain   Associated symptoms: no arthralgias, no diarrhea and no headaches   Abdominal pain:    Location:  Epigastric   Severity:  Moderate   Onset quality:  Gradual   Timing:  Intermittent   Progression:  Worsening   Chronicity:  Chronic   Past Medical History  Diagnosis Date  . Multiple sclerosis   . Irritable bowel syndrome   . Arthritis   . Pancreatitis   . Anxiety   . Depression   . Grave's disease   . Chronic pain   . Graves disease   . Migraine   . PTSD (post-traumatic stress disorder)   . Methadone use     Past Surgical History  Procedure Laterality Date  . Sphincterotomy    . Cholecystectomy      Family History  Problem Relation Age of Onset  . Cancer Mother   . Hypertension Father     History  Substance Use Topics  . Smoking status: Current Every Day Smoker -- 0.50 packs/day for 8 years  . Smokeless tobacco: Never Used  . Alcohol Use: No    OB History   Grav Para Term Preterm Abortions TAB SAB Ect Mult Living            Review of Systems  Constitutional: Negative for fever and fatigue.  HENT: Negative for congestion, rhinorrhea and postnasal drip.   Eyes: Negative for photophobia and visual disturbance.  Respiratory: Negative for chest tightness, shortness of breath and wheezing.   Cardiovascular: Negative for chest pain, palpitations and leg swelling.  Gastrointestinal: Positive for vomiting and abdominal pain. Negative for nausea and diarrhea.  Genitourinary: Negative for urgency, frequency and difficulty urinating.  Musculoskeletal: Negative for back pain and arthralgias.  Skin: Negative for rash and wound.  Neurological: Negative for weakness and headaches.  Psychiatric/Behavioral: Negative for confusion and agitation.    Allergies  Erythromycin and Morphine and related  Home Medications   Current Outpatient Rx  Name  Route  Sig  Dispense  Refill  . albuterol (PROVENTIL HFA;VENTOLIN HFA) 108 (90 BASE) MCG/ACT inhaler   Inhalation   Inhale 2 puffs into the lungs every 4 (four) hours as needed. For shortness of breath         . aspirin EC 81 MG tablet   Oral   Take 1 tablet (81 mg total) by mouth daily.   30 tablet   0   . buPROPion (WELLBUTRIN XL) 150 MG 24 hr tablet   Oral   Take 150 mg by mouth daily.         Marland Kitchen  cyclobenzaprine (FLEXERIL) 10 MG tablet   Oral   Take 10 mg by mouth 3 (three) times daily as needed. For muscle spasms.         . diclofenac (VOLTAREN) 75 MG EC tablet   Oral   Take 75 mg by mouth daily.          . diclofenac sodium (VOLTAREN) 1 % GEL   Topical   Apply 1 application topically 2 (two) times daily. Knees, hips, neck         . escitalopram (LEXAPRO) 10 MG tablet   Oral   Take 30 mg by mouth daily.         Marland Kitchen imipramine (TOFRANIL) 50 MG tablet   Oral   Take 100 mg by mouth at bedtime.          . lansoprazole (PREVACID) 15 MG capsule   Oral   Take 15 mg by mouth daily.         Marland Kitchen levothyroxine (SYNTHROID, LEVOTHROID) 100  MCG tablet   Oral   Take 100 mcg by mouth daily.         . Melatonin 3 MG TABS   Oral   Take 1 tablet by mouth at bedtime as needed. For sleep         . methadone (DOLOPHINE) 10 MG/5ML solution   Oral   Take 85 mg by mouth daily.          . Multiple Vitamin (MULTIVITAMIN WITH MINERALS) TABS   Oral   Take 1 tablet by mouth daily.         . ondansetron (ZOFRAN) 4 MG tablet   Oral   Take 1 tablet (4 mg total) by mouth every 6 (six) hours.   12 tablet   0   . potassium chloride (K-DUR,KLOR-CON) 10 MEQ tablet   Oral   Take 10 mEq by mouth daily.           BP 112/69  Pulse 99  Temp(Src) 98.1 F (36.7 C) (Oral)  Resp 18  SpO2 92%  Physical Exam  Nursing note and vitals reviewed. Constitutional: She is oriented to person, place, and time. She appears well-developed and well-nourished. No distress.  HENT:  Head: Normocephalic and atraumatic.  Mouth/Throat: Oropharynx is clear and moist.  Eyes: EOM are normal. Pupils are equal, round, and reactive to light.  Neck: Normal range of motion. Neck supple.  Cardiovascular: Normal rate, regular rhythm, normal heart sounds and intact distal pulses.   Pulmonary/Chest: Effort normal and breath sounds normal. She has no wheezes. She has no rales.  Abdominal: Soft. Bowel sounds are normal. She exhibits no distension. There is tenderness. There is no rebound and no guarding.  Mild epigastric tenderness. Soft and no peritonitis. Negative McBurney's point. Negative Murphy's sign.  Musculoskeletal: Normal range of motion. She exhibits no edema and no tenderness.  Lymphadenopathy:    She has no cervical adenopathy.  Neurological: She is alert and oriented to person, place, and time. She displays normal reflexes. No cranial nerve deficit. She exhibits normal muscle tone. Coordination normal.  Skin: Skin is warm and dry. No rash noted.  Psychiatric: She has a normal mood and affect. Her behavior is normal.    ED Course  Angiocath  insertion Date/Time: 12/15/2012 1:15 AM Performed by: Johnnette Gourd Authorized by: Gwyneth Sprout Consent: Verbal consent obtained. Risks and benefits: risks, benefits and alternatives were discussed Consent given by: patient Preparation: Patient was prepped and draped in the usual sterile fashion.  Local anesthesia used: no Patient sedated: no Patient tolerance: Patient tolerated the procedure well with no immediate complications. Comments: 18 gauge angiocatheter inserted into right EJ with non-pulsatile blood return. No complications.   (including critical care time)  Labs Reviewed  CBC WITH DIFFERENTIAL - Abnormal; Notable for the following:    Hemoglobin 15.7 (*)    MCV 101.3 (*)    MCH 35.0 (*)    All other components within normal limits  COMPREHENSIVE METABOLIC PANEL - Abnormal; Notable for the following:    Chloride 95 (*)    CO2 34 (*)    BUN 5 (*)    Albumin 2.8 (*)    AST 47 (*)    Alkaline Phosphatase 197 (*)    All other components within normal limits  URINALYSIS, ROUTINE W REFLEX MICROSCOPIC - Abnormal; Notable for the following:    Color, Urine AMBER (*)    APPearance CLOUDY (*)    Bilirubin Urine MODERATE (*)    Urobilinogen, UA 4.0 (*)    Leukocytes, UA SMALL (*)    All other components within normal limits  URINE MICROSCOPIC-ADD ON - Abnormal; Notable for the following:    Squamous Epithelial / LPF MANY (*)    Bacteria, UA FEW (*)    Crystals CA OXALATE CRYSTALS (*)    All other components within normal limits  URINE CULTURE  LIPASE, BLOOD   No results found.   1. Abdominal pain, chronic, epigastric   2. 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 She reports seeing GI at [facility] and has had sphincter of oddi stenting in the past. She was seen here last month for epigastric pain that improved with pain meds  and ODT Zofran. Exam as noted above. HR normal. Normotensive. Abdomen tender in epigastrium but negative Murphy's and negative McBurney's. No peritonitis. Dry mucous membranes. Right EJ IV placed and given 1L IVF and 1 mg Dilaudid. Labs unrevealing. Normal lipase, electrolytes, renal function. Does not warrant abdominal imaging at this time.  Pt feeling better. She is tolerating PO. Deemed stable for d/c home. Instructed her to f/u with her GI physician at next available appointment. Was not given rx for narcotics at this visit. She was instructed to see her pcp if unable to see GI soon for further management of abdominal pain. Pt endorses understanding.        Johnnette Gourd, MD 12/15/12 [phone]

## 2012-12-14 NOTE — ED Notes (Signed)
The patient is AOx4 and comfortable with the discharge instructions. 

## 2012-12-15 NOTE — ED Provider Notes (Signed)
I saw and evaluated the patient, reviewed the resident's note and I agree with the findings and plan. Patient with a history of chronic abdominal pain who presents with her typical pain symptoms. She was given pain control here which improved her symptoms. Her labs are unrevealing. On exam she has tenderness in her epigastric area but no right upper quadrant tenderness. There are no signs concerning for appendicitis. Patient was discharged home and instructed to followup with GI or her PCP  Gwyneth Sprout, MD 12/15/12 [phone]

## 2012-12-16 LAB — URINE CULTURE: Colony Count: 4000

## 2013-01-16 ENCOUNTER — Emergency Department (INDEPENDENT_AMBULATORY_CARE_PROVIDER_SITE_OTHER)
Admission: EM | Admit: 2013-01-16 | Discharge: 2013-01-16 | Disposition: A | Discharge status: Home / Self Care | Payer: Medicare Other | Source: Home / Self Care

## 2013-01-16 ENCOUNTER — Encounter (HOSPITAL_COMMUNITY): Payer: Self-pay | Admitting: Emergency Medicine

## 2013-01-16 DIAGNOSIS — G8929 Other chronic pain: Secondary | ICD-10-CM

## 2013-01-16 MED ORDER — TRAMADOL HCL 50 MG PO TABS: 50.0000 mg | ORAL_TABLET | Freq: Four times a day (QID) | ORAL | Status: DC | PRN

## 2013-01-16 NOTE — ED Notes (Addendum)
Pt c/o bilateral knee pain onset Saturday Hx of Arthritis and MS Pain is aching and constant w/occasiaonal sharp pains; pain radiates towards thighs Taking Voltaren and Flexeril w/little relief Had Tyle around 1500 today Denies: inj/trauma  She is alert and oriented w/no signs of acute distress.

## 2013-01-16 NOTE — ED Provider Notes (Signed)
History     CSN: [id number]  Arrival date & time 01/16/13  1846   First MD Initiated Contact with Patient 01/16/13 1906      Chief Complaint  Patient presents with  . Knee Pain    (Consider location/radiation/quality/duration/timing/severity/associated sxs/prior treatment) HPI Comments: 47 y.o. F with chronic thigh cramps/tightness and knee pain/OA followed by Ortho here tonight because her MD is out of town until next week and her chronic pain is getting worse.  Usually she take voltaren po and gel, which helps, but lately it has not been helping as much. No recent injury or activity changes.  Pain is a little better after resting.  Has been taking her flexeril for her muscle spasm/cramps in her bilateral thighs, but it is not helping as much.  Knees are throbbing, worst in the upper, outer quadrant of knee. Has tried ice and heat, which help for a short period of time.  No redness or swelling. No fevers/chills. No low back pain, no radiation of pain into thighs.   Patient is a 47 y.o. female presenting with knee pain. The history is provided by the patient. No language interpreter was used.  Knee Pain Location:  Knee and leg Injury: no   Leg location:  L upper leg and R upper leg Knee location:  L knee and R knee Pain details:    Quality:  Aching, cramping and throbbing   Radiates to:  Does not radiate   Severity:  Severe   Timing:  Constant   Progression:  Worsening Chronicity:  Chronic Dislocation: no   Foreign body present:  No foreign bodies Prior injury to area:  No Relieved by:  Ice, heat, muscle relaxant, NSAIDs and rest Worsened by:  Exercise, bearing weight and activity Ineffective treatments:  Muscle relaxant and NSAIDs Associated symptoms: stiffness   Associated symptoms: no back pain, no decreased ROM, no fever, no muscle weakness, no neck pain, no numbness, no swelling and no tingling   Risk factors: no frequent fractures, no known bone disorder and no recent  illness     Past Medical History  Diagnosis Date  . Multiple sclerosis   . Irritable bowel syndrome   . Arthritis   . Pancreatitis   . Anxiety   . Depression   . Grave's disease   . Chronic pain   . Graves disease   . Migraine   . PTSD (post-traumatic stress disorder)   . Methadone use     Past Surgical History  Procedure Laterality Date  . Sphincterotomy    . Cholecystectomy      Family History  Problem Relation Age of Onset  . Cancer Mother   . Hypertension Father     History  Substance Use Topics  . Smoking status: Current Every Day Smoker -- 0.50 packs/day for 8 years  . Smokeless tobacco: Never Used  . Alcohol Use: No    OB History   Grav Para Term Preterm Abortions TAB SAB Ect Mult Living                  Review of Systems  Constitutional: Positive for activity change. Negative for fever.  HENT: Negative for neck pain and neck stiffness.   Respiratory: Negative for chest tightness and shortness of breath.   Cardiovascular: Negative for chest pain.  Gastrointestinal: Negative for abdominal pain.  Musculoskeletal: Positive for myalgias, arthralgias and stiffness. Negative for back pain and joint swelling.  Skin: Negative for color change and rash.  Allergies  Erythromycin and Morphine and related  Home Medications   Current Outpatient Rx  Name  Route  Sig  Dispense  Refill  . buPROPion (WELLBUTRIN XL) 150 MG 24 hr tablet   Oral   Take 150 mg by mouth daily.         . cyclobenzaprine (FLEXERIL) 10 MG tablet   Oral   Take 10 mg by mouth 3 (three) times daily as needed. For muscle spasms.         Marland Kitchen escitalopram (LEXAPRO) 10 MG tablet   Oral   Take 30 mg by mouth daily.         Marland Kitchen imipramine (TOFRANIL) 50 MG tablet   Oral   Take 100 mg by mouth at bedtime.          Marland Kitchen albuterol (PROVENTIL HFA;VENTOLIN HFA) 108 (90 BASE) MCG/ACT inhaler   Inhalation   Inhale 2 puffs into the lungs every 4 (four) hours as needed. For shortness of  breath         . aspirin EC 81 MG tablet   Oral   Take 1 tablet (81 mg total) by mouth daily.   30 tablet   0   . diclofenac (VOLTAREN) 75 MG EC tablet   Oral   Take 75 mg by mouth daily.          . diclofenac sodium (VOLTAREN) 1 % GEL   Topical   Apply 1 application topically 2 (two) times daily. Knees, hips, neck         . lansoprazole (PREVACID) 15 MG capsule   Oral   Take 15 mg by mouth daily.         Marland Kitchen levothyroxine (SYNTHROID, LEVOTHROID) 100 MCG tablet   Oral   Take 100 mcg by mouth daily.         . Melatonin 3 MG TABS   Oral   Take 1 tablet by mouth at bedtime as needed. For sleep         . methadone (DOLOPHINE) 10 MG/5ML solution   Oral   Take 85 mg by mouth daily.          . Multiple Vitamin (MULTIVITAMIN WITH MINERALS) TABS   Oral   Take 1 tablet by mouth daily.         . ondansetron (ZOFRAN) 4 MG tablet   Oral   Take 1 tablet (4 mg total) by mouth every 6 (six) hours.   12 tablet   0   . potassium chloride (K-DUR,KLOR-CON) 10 MEQ tablet   Oral   Take 10 mEq by mouth daily.         . traMADol (ULTRAM) 50 MG tablet   Oral   Take 1 tablet (50 mg total) by mouth every 6 (six) hours as needed for pain.   20 tablet   0     BP 126/79  Pulse 86  Temp(Src) 98.2 F (36.8 C) (Oral)  Resp 18  SpO2 99%  LMP 04/28/2011  Physical Exam  Nursing note and vitals reviewed. Constitutional: She is oriented to person, place, and time. She appears well-developed and well-nourished. No distress.  Shaky/jittery   HENT:  Head: Normocephalic and atraumatic.  Eyes: EOM are normal.  Neck: Normal range of motion. Neck supple.  Cardiovascular: Normal rate and regular rhythm.   Pulmonary/Chest: Effort normal.  Musculoskeletal:       Right knee: She exhibits bony tenderness. She exhibits normal range of motion, no swelling, no effusion, no  ecchymosis, no deformity, no laceration and no erythema. Tenderness found. Medial joint line, lateral joint  line and patellar tendon tenderness noted.       Left knee: She exhibits normal range of motion, no swelling, no effusion, no ecchymosis, no deformity, no laceration and no erythema. Tenderness found. Medial joint line, lateral joint line and patellar tendon tenderness noted.  Bilateral thighs without muscle spasm or tenderness on exam; strength 5/5 at hips, knees, ankles, toes bilaterally; sensation intact throughout BLE   Neurological: She is alert and oriented to person, place, and time.  Skin: Skin is warm and dry.  Skin on legs very dry    ED Course  Procedures (including critical care time)  Labs Reviewed - No data to display No results found.   1. Bilateral chronic knee pain   2. Muscle spasm of both lower legs       MDM          Tito Dine, MD 01/16/13 2022

## 2013-01-17 NOTE — ED Provider Notes (Signed)
Medical screening examination/treatment/procedure(s) were performed by a resident physician and as supervising physician I was immediately available for consultation/collaboration.  Leslee Home, M.D.  Reuben Likes, MD 01/17/13 [phone]

## 2013-07-24 DIAGNOSIS — C159 Malignant neoplasm of esophagus, unspecified: Secondary | ICD-10-CM

## 2013-07-24 HISTORY — PX: OTHER SURGICAL HISTORY: SHX169

## 2013-07-24 HISTORY — DX: Malignant neoplasm of esophagus, unspecified: C15.9

## 2013-07-25 ENCOUNTER — Encounter: Payer: Self-pay | Admitting: Cardiovascular Disease

## 2013-07-28 ENCOUNTER — Emergency Department (INDEPENDENT_AMBULATORY_CARE_PROVIDER_SITE_OTHER): Payer: Medicare Other

## 2013-07-28 ENCOUNTER — Encounter (HOSPITAL_COMMUNITY): Payer: Self-pay | Admitting: Emergency Medicine

## 2013-07-28 ENCOUNTER — Emergency Department (INDEPENDENT_AMBULATORY_CARE_PROVIDER_SITE_OTHER)
Admission: EM | Admit: 2013-07-28 | Discharge: 2013-07-28 | Disposition: A | Discharge status: Home / Self Care | Payer: Medicare Other | Source: Home / Self Care | Attending: Family Medicine | Admitting: Family Medicine

## 2013-07-28 DIAGNOSIS — R05 Cough: Secondary | ICD-10-CM

## 2013-07-28 DIAGNOSIS — J189 Pneumonia, unspecified organism: Secondary | ICD-10-CM

## 2013-07-28 MED ORDER — ALBUTEROL SULFATE (5 MG/ML) 0.5% IN NEBU
INHALATION_SOLUTION | RESPIRATORY_TRACT | Status: AC
  Filled 2013-07-28: qty 1

## 2013-07-28 MED ORDER — ALBUTEROL SULFATE HFA 108 (90 BASE) MCG/ACT IN AERS: 2.0000 | INHALATION_SPRAY | RESPIRATORY_TRACT | Status: DC | PRN

## 2013-07-28 MED ORDER — AZITHROMYCIN 250 MG PO TABS: ORAL_TABLET | ORAL | Status: DC

## 2013-07-28 MED ORDER — AMOXICILLIN 875 MG PO TABS: 875.0000 mg | ORAL_TABLET | Freq: Two times a day (BID) | ORAL | Status: DC

## 2013-07-28 MED ORDER — ALBUTEROL SULFATE (5 MG/ML) 0.5% IN NEBU
5.0000 mg | INHALATION_SOLUTION | Freq: Once | RESPIRATORY_TRACT | Status: AC
  Administered 2013-07-28: 5 mg via RESPIRATORY_TRACT

## 2013-07-28 MED ORDER — IPRATROPIUM BROMIDE 0.02 % IN SOLN
0.5000 mg | Freq: Once | RESPIRATORY_TRACT | Status: AC
  Administered 2013-07-28: 0.5 mg via RESPIRATORY_TRACT

## 2013-07-28 NOTE — ED Provider Notes (Signed)
CSN: [id number]     Arrival date & time 07/28/13  1850 History   First MD Initiated Contact with Patient 07/28/13 2054     Chief Complaint  Patient presents with  . Cough    sob. low grade temp. yellow/green sputum.    (Consider location/radiation/quality/duration/timing/severity/associated sxs/prior Treatment) Patient is a 47 y.o. female presenting with cough. The history is provided by the patient.  Cough Cough characteristics:  Productive Sputum characteristics:  Green Severity:  Severe Onset quality:  Gradual Duration:  1 week Timing:  Intermittent Progression:  Worsening Chronicity:  New Smoker: yes   Relieved by:  Nothing Worsened by:  Nothing tried Ineffective treatments: otc meds. Associated symptoms: shortness of breath   Associated symptoms: no chest pain, no chills, no ear pain, no fever, no rhinorrhea, no sinus congestion and no wheezing     Past Medical History  Diagnosis Date  . Multiple sclerosis   . Irritable bowel syndrome   . Arthritis   . Pancreatitis   . Anxiety   . Depression   . Grave's disease   . Chronic pain   . Graves disease   . Migraine   . PTSD (post-traumatic stress disorder)   . Methadone use    Past Surgical History  Procedure Laterality Date  . Sphincterotomy    . Cholecystectomy     Family History  Problem Relation Age of Onset  . Cancer Mother   . Hypertension Father    History  Substance Use Topics  . Smoking status: Current Every Day Smoker -- 0.50 packs/day for 8 years  . Smokeless tobacco: Never Used  . Alcohol Use: No   OB History   Grav Para Term Preterm Abortions TAB SAB Ect Mult Living                 Review of Systems  Constitutional: Negative for fever and chills.  HENT: Negative for ear pain and rhinorrhea.   Respiratory: Positive for cough and shortness of breath. Negative for wheezing.   Cardiovascular: Negative for chest pain.    Allergies  Erythromycin and Morphine and related  Home Medications    Current Outpatient Rx  Name  Route  Sig  Dispense  Refill  . buPROPion (WELLBUTRIN XL) 150 MG 24 hr tablet   Oral   Take 150 mg by mouth daily.         Marland Kitchen escitalopram (LEXAPRO) 10 MG tablet   Oral   Take 30 mg by mouth daily.         Marland Kitchen imipramine (TOFRANIL) 50 MG tablet   Oral   Take 100 mg by mouth at bedtime.          . lansoprazole (PREVACID) 15 MG capsule   Oral   Take 15 mg by mouth daily.         Marland Kitchen levothyroxine (SYNTHROID, LEVOTHROID) 100 MCG tablet   Oral   Take 100 mcg by mouth daily.         . Melatonin 3 MG TABS   Oral   Take 1 tablet by mouth at bedtime as needed. For sleep         . methadone (DOLOPHINE) 10 MG/5ML solution   Oral   Take 85 mg by mouth daily.          . potassium chloride (K-DUR,KLOR-CON) 10 MEQ tablet   Oral   Take 10 mEq by mouth daily.         Marland Kitchen albuterol (PROVENTIL HFA;VENTOLIN HFA) 108 (  90 BASE) MCG/ACT inhaler   Inhalation   Inhale 2 puffs into the lungs every 4 (four) hours as needed. For shortness of breath         . albuterol (PROVENTIL HFA;VENTOLIN HFA) 108 (90 BASE) MCG/ACT inhaler   Inhalation   Inhale 2 puffs into the lungs every 4 (four) hours as needed for wheezing or shortness of breath.   1 Inhaler   0   . amoxicillin (AMOXIL) 875 MG tablet   Oral   Take 1 tablet (875 mg total) by mouth 2 (two) times daily.   20 tablet   0   . azithromycin (ZITHROMAX Z-PAK) 250 MG tablet      500mg  po day one, then 250mg  qd for 4 days   6 tablet   0   . cyclobenzaprine (FLEXERIL) 10 MG tablet   Oral   Take 10 mg by mouth 3 (three) times daily as needed. For muscle spasms.         . diclofenac (VOLTAREN) 75 MG EC tablet   Oral   Take 75 mg by mouth daily.          . diclofenac sodium (VOLTAREN) 1 % GEL   Topical   Apply 1 application topically 2 (two) times daily. Knees, hips, neck         . Multiple Vitamin (MULTIVITAMIN WITH MINERALS) TABS   Oral   Take 1 tablet by mouth daily.          . ondansetron (ZOFRAN) 4 MG tablet   Oral   Take 1 tablet (4 mg total) by mouth every 6 (six) hours.   12 tablet   0   . traMADol (ULTRAM) 50 MG tablet   Oral   Take 1 tablet (50 mg total) by mouth every 6 (six) hours as needed for pain.   20 tablet   0    BP 118/77  Pulse 90  Temp(Src) 98.5 F (36.9 C) (Oral)  Resp 20  SpO2 90%  LMP 04/28/2011 Physical Exam  Constitutional: She appears well-developed and well-nourished.  HENT:  Right Ear: Tympanic membrane, external ear and ear canal normal.  Left Ear: Tympanic membrane, external ear and ear canal normal.  Mouth/Throat: Oropharynx is clear and moist and mucous membranes are normal.  Cardiovascular: Normal rate and regular rhythm.   Pulmonary/Chest: Effort normal. She has decreased breath sounds. She has wheezes. She has rhonchi.    ED Course  Procedures (including critical care time) Labs Review Labs Reviewed - No data to display Imaging Review Dg Chest 2 View  07/28/2013   CLINICAL DATA:  Cough for 3 days. Cigarette smoker.  EXAM: CHEST  2 VIEW  COMPARISON:  08/28/2012.  FINDINGS: Cardiopericardial silhouette within normal limits for projection. There is bilateral perihilar airspace disease extending into both lower lobes. More focal airspace disease extends to the right upper lobe. There is no effusion. Cholecystectomy clips are present in the right upper quadrant. Trachea midline. Mediastinal contours demonstrate prominence in the AP window which may represent adenopathy.  Followup to ensure radiographic clearing and exclude an underlying lesion is recommended. Typically clearing will be observed at 8 weeks.  IMPRESSION: Diffuse perihilar airspace disease and more focal right upper lobe airspace disease. This is favored to represent multifocal infection although CHF with asymmetric/atypical pulmonary edema could produce a similar appearance.   Electronically Signed   By: Andreas Newport M.D.   On: 07/28/2013 20:39    MDM    1. Community acquired pneumonia  Given albuterol and atrovent nebs here in [facility]; pulse ox improved to 93% on RA.  Pt feels she is breathing better. Wheezing resolved, rhonchi heard in all lung fields.  Rx albuterol HFA prn sob/wheezing and amoxicillin 875mg  BID for 10 days and a zpak.  Pt to f/u with Dr. Concepcion Elk this week or early next, and to go to ER if worsens at all.     Cathlyn Parsons, NP 07/28/13 2123

## 2013-07-28 NOTE — ED Notes (Signed)
C/o productive cough with yellow/green sputum. Sob. Low grade temp. Pain with inhaling. Symptoms present since Saturday. Tried otc meds with no relief.  rx dx of esophagitis.

## 2013-07-29 ENCOUNTER — Emergency Department (HOSPITAL_COMMUNITY): Payer: Medicare Other

## 2013-07-29 ENCOUNTER — Encounter (HOSPITAL_COMMUNITY): Payer: Self-pay

## 2013-07-29 ENCOUNTER — Inpatient Hospital Stay (HOSPITAL_COMMUNITY)
Admission: EM | Admit: 2013-07-29 | Discharge: 2013-08-04 | DRG: 194 | Disposition: A | Discharge status: Home / Self Care | Payer: Medicare Other | Attending: Internal Medicine | Admitting: Internal Medicine

## 2013-07-29 DIAGNOSIS — K589 Irritable bowel syndrome without diarrhea: Secondary | ICD-10-CM

## 2013-07-29 DIAGNOSIS — R079 Chest pain, unspecified: Secondary | ICD-10-CM

## 2013-07-29 DIAGNOSIS — F329 Major depressive disorder, single episode, unspecified: Secondary | ICD-10-CM | POA: Diagnosis present

## 2013-07-29 DIAGNOSIS — C160 Malignant neoplasm of cardia: Secondary | ICD-10-CM | POA: Diagnosis present

## 2013-07-29 DIAGNOSIS — M129 Arthropathy, unspecified: Secondary | ICD-10-CM | POA: Diagnosis present

## 2013-07-29 DIAGNOSIS — E876 Hypokalemia: Secondary | ICD-10-CM

## 2013-07-29 DIAGNOSIS — F1120 Opioid dependence, uncomplicated: Secondary | ICD-10-CM

## 2013-07-29 DIAGNOSIS — E0590 Thyrotoxicosis, unspecified without thyrotoxic crisis or storm: Secondary | ICD-10-CM

## 2013-07-29 DIAGNOSIS — G35 Multiple sclerosis: Secondary | ICD-10-CM

## 2013-07-29 DIAGNOSIS — K861 Other chronic pancreatitis: Secondary | ICD-10-CM | POA: Diagnosis present

## 2013-07-29 DIAGNOSIS — K859 Acute pancreatitis without necrosis or infection, unspecified: Secondary | ICD-10-CM

## 2013-07-29 DIAGNOSIS — C801 Malignant (primary) neoplasm, unspecified: Secondary | ICD-10-CM

## 2013-07-29 DIAGNOSIS — F411 Generalized anxiety disorder: Secondary | ICD-10-CM | POA: Diagnosis present

## 2013-07-29 DIAGNOSIS — J690 Pneumonitis due to inhalation of food and vomit: Secondary | ICD-10-CM | POA: Diagnosis present

## 2013-07-29 DIAGNOSIS — J189 Pneumonia, unspecified organism: Principal | ICD-10-CM

## 2013-07-29 DIAGNOSIS — I10 Essential (primary) hypertension: Secondary | ICD-10-CM | POA: Diagnosis present

## 2013-07-29 DIAGNOSIS — F4310 Post-traumatic stress disorder, unspecified: Secondary | ICD-10-CM | POA: Diagnosis present

## 2013-07-29 DIAGNOSIS — E039 Hypothyroidism, unspecified: Secondary | ICD-10-CM | POA: Diagnosis present

## 2013-07-29 DIAGNOSIS — E0500 Thyrotoxicosis with diffuse goiter without thyrotoxic crisis or storm: Secondary | ICD-10-CM

## 2013-07-29 DIAGNOSIS — F3289 Other specified depressive episodes: Secondary | ICD-10-CM | POA: Diagnosis present

## 2013-07-29 DIAGNOSIS — K834 Spasm of sphincter of Oddi: Secondary | ICD-10-CM

## 2013-07-29 DIAGNOSIS — G8929 Other chronic pain: Secondary | ICD-10-CM | POA: Diagnosis present

## 2013-07-29 DIAGNOSIS — F17200 Nicotine dependence, unspecified, uncomplicated: Secondary | ICD-10-CM | POA: Diagnosis present

## 2013-07-29 LAB — CBC
Hemoglobin: 13.1 g/dL (ref 12.0–15.0)
MCH: 31.9 pg (ref 26.0–34.0)
MCHC: 35.6 g/dL (ref 30.0–36.0)
MCV: 89.5 fL (ref 78.0–100.0)
RDW: 12.4 % (ref 11.5–15.5)

## 2013-07-29 LAB — BASIC METABOLIC PANEL
Calcium: 8.5 mg/dL (ref 8.4–10.5)
Creatinine, Ser: 0.5 mg/dL (ref 0.50–1.10)
GFR calc non Af Amer: >90 mL/min (ref >90)
Glucose, Bld: 90 mg/dL (ref 70–99)
Potassium: 3.1 mEq/L — ABNORMAL LOW (ref 3.5–5.1)
Sodium: 135 mEq/L (ref 135–145)

## 2013-07-29 LAB — PRO B NATRIURETIC PEPTIDE: Pro B Natriuretic peptide (BNP): 74.9 pg/mL (ref 0–125)

## 2013-07-29 LAB — POCT I-STAT TROPONIN I: Troponin i, poc: 0 ng/mL (ref 0.00–0.08)

## 2013-07-29 MED ORDER — CYCLOBENZAPRINE HCL 10 MG PO TABS
10.0000 mg | ORAL_TABLET | Freq: Once | ORAL | Status: AC
  Administered 2013-07-29: 10 mg via ORAL
  Filled 2013-07-29: qty 1

## 2013-07-29 MED ORDER — ONDANSETRON HCL 4 MG/2ML IJ SOLN: 4.0000 mg | Freq: Once | INTRAMUSCULAR | Status: DC

## 2013-07-29 MED ORDER — ONDANSETRON 4 MG PO TBDP
4.0000 mg | ORAL_TABLET | Freq: Once | ORAL | Status: AC
  Administered 2013-07-29: 4 mg via ORAL
  Filled 2013-07-29: qty 1

## 2013-07-29 MED ORDER — DEXTROSE 5 % IV SOLN
1.0000 g | Freq: Once | INTRAVENOUS | Status: AC
  Administered 2013-07-29: 1 g via INTRAVENOUS
  Filled 2013-07-29: qty 10

## 2013-07-29 NOTE — ED Provider Notes (Signed)
CSN: [id number]     Arrival date & time 07/29/13  1925 History   First MD Initiated Contact with Patient 07/29/13 2008     Chief Complaint  Patient presents with  . Shortness of Breath   (Consider location/radiation/quality/duration/timing/severity/associated sxs/prior Treatment) Patient is a 47 y.o. female presenting with shortness of breath. The history is provided by the patient.  Shortness of Breath Associated symptoms: chest pain and cough   Associated symptoms: no abdominal pain, no headaches, no rash and no vomiting    patient presents with shortness of breath. She was seen at urgent care yesterday and diagnosed with pneumonia. She's been on Augmentin and azithromycin. She's gotten worse. Upon arrival her saturation was 82%. She improved with oxygen. She states she's had some chills. She's had a cough productive of green sputum. She states she also had a recent upper endoscopy and her esophagus was irritated. She is not on home oxygen. She has a history of multiple sclerosis. She's also had nausea without vomiting. She's been on only orals do to her chest pain from the esophagus.  Past Medical History  Diagnosis Date  . Multiple sclerosis   . Irritable bowel syndrome   . Arthritis   . Pancreatitis   . Anxiety   . Depression   . Grave's disease   . Chronic pain   . Graves disease   . Migraine   . PTSD (post-traumatic stress disorder)   . Methadone use    Past Surgical History  Procedure Laterality Date  . Sphincterotomy    . Cholecystectomy     Family History  Problem Relation Age of Onset  . Cancer Mother   . Hypertension Father    History  Substance Use Topics  . Smoking status: Current Every Day Smoker -- 0.50 packs/day for 8 years  . Smokeless tobacco: Never Used  . Alcohol Use: No   OB History   Grav Para Term Preterm Abortions TAB SAB Ect Mult Living                 Review of Systems  Constitutional: Positive for chills, appetite change and fatigue.  Negative for activity change.  HENT: Negative for neck stiffness.   Eyes: Negative for pain.  Respiratory: Positive for cough, chest tightness and shortness of breath.   Cardiovascular: Positive for chest pain. Negative for leg swelling.  Gastrointestinal: Positive for nausea. Negative for vomiting, abdominal pain and diarrhea.  Genitourinary: Negative for flank pain.  Musculoskeletal: Negative for back pain.  Skin: Negative for rash.  Neurological: Negative for weakness, numbness and headaches.  Psychiatric/Behavioral: Negative for behavioral problems.    Allergies  Erythromycin and Morphine and related  Home Medications   No current outpatient prescriptions on file. BP 133/67  Pulse 90  Temp(Src) 97.4 F (36.3 C) (Axillary)  Resp 18  SpO2 96%  LMP 04/28/2011 Physical Exam  Nursing note and vitals reviewed. Constitutional: She is oriented to person, place, and time. She appears well-developed and well-nourished.  HENT:  Head: Normocephalic and atraumatic.  Eyes: EOM are normal. Pupils are equal, round, and reactive to light.  Neck: Normal range of motion. Neck supple.  Cardiovascular: Normal rate, regular rhythm and normal heart sounds.   No murmur heard. Pulmonary/Chest: No respiratory distress. She has no wheezes. She has no rales.  Diffuse harsh wheezes and prolonged expirations.  Abdominal: Soft. Bowel sounds are normal. She exhibits no distension. There is no tenderness. There is no rebound and no guarding.  Musculoskeletal: Normal range  of motion.  Neurological: She is alert and oriented to person, place, and time. No cranial nerve deficit.  Skin: Skin is warm and dry.  Psychiatric: She has a normal mood and affect. Her speech is normal.    ED Course  Procedures (including critical care time) Labs Review Labs Reviewed  CBC - Abnormal; Notable for the following:    WBC 12.2 (*)    All other components within normal limits  BASIC METABOLIC PANEL - Abnormal;  Notable for the following:    Potassium 3.1 (*)    Chloride 93 (*)    All other components within normal limits  CULTURE, BLOOD (ROUTINE X 2)  CULTURE, BLOOD (ROUTINE X 2)  CULTURE, EXPECTORATED SPUTUM-ASSESSMENT  GRAM STAIN  INFLUENZA ANTIGEN  PRO B NATRIURETIC PEPTIDE  LEGIONELLA ANTIGEN, URINE  STREP PNEUMONIAE URINARY ANTIGEN  COMPREHENSIVE METABOLIC PANEL  CBC WITH DIFFERENTIAL  HEPATIC FUNCTION PANEL  LIPASE, BLOOD  POCT I-STAT TROPONIN I   Imaging Review Dg Chest 2 View  07/29/2013   CLINICAL DATA:  Shortness of breath. Chest pain with inspiration. Recently diagnosed with pneumonia. History of multiple sclerosis, irritable bowel syndrome, pancreatitis and Graves disease.  EXAM: CHEST  2 VIEW  COMPARISON:  07/28/2013 and 08/28/2012.  FINDINGS: The bilateral perihilar airspace opacities have not significantly changed from yesterday's examination. There is no significant pleural effusion. The heart size and mediastinal contours are stable with persistent fullness in the AP window which may reflect lymphadenopathy. No acute osseous findings are seen. Cholecystectomy clips are again noted.  IMPRESSION: No significant change in perihilar airspace opacities and suspected AP window adenopathy compared with yesterday's examination. findings remain most consistent with pneumonia, although could reflect atypical edema or noninfectious inflammation. Continued radiographic follow up recommended.   Electronically Signed   By: Roxy Horseman   On: 07/29/2013 20:46   Dg Chest 2 View  07/28/2013   CLINICAL DATA:  Cough for 3 days. Cigarette smoker.  EXAM: CHEST  2 VIEW  COMPARISON:  08/28/2012.  FINDINGS: Cardiopericardial silhouette within normal limits for projection. There is bilateral perihilar airspace disease extending into both lower lobes. More focal airspace disease extends to the right upper lobe. There is no effusion. Cholecystectomy clips are present in the right upper quadrant. Trachea  midline. Mediastinal contours demonstrate prominence in the AP window which may represent adenopathy.  Followup to ensure radiographic clearing and exclude an underlying lesion is recommended. Typically clearing will be observed at 8 weeks.  IMPRESSION: Diffuse perihilar airspace disease and more focal right upper lobe airspace disease. This is favored to represent multifocal infection although CHF with asymmetric/atypical pulmonary edema could produce a similar appearance.   Electronically Signed   By: Andreas Newport M.D.   On: 07/28/2013 20:39    MDM   1. CAP (community acquired pneumonia)   2. Grave's disease   3. Hypokalemia   4. IBS (irritable bowel syndrome)   5. Opioid dependence   6. Multiple sclerosis    Patient with community acquired pneumonia. Seen yesterday for same and has had worsening of symptoms. She has a mild hypoxia on room air. Will admit to internal medicine.    Juliet Rude. Rubin Payor, MD 07/30/13 [facility]

## 2013-07-29 NOTE — ED Notes (Addendum)
Pt c/o SOB and a dry cough starting on Saturday evening. Pt seen at UC last night received a nebulizer treatment with some relief. Pt's O2 sats were 82% RA and placed on 4L [place] increasing to 98%. Pt reports having an endoscopy last Thursday with no difficulties

## 2013-07-29 NOTE — H&P (Signed)
[facility] History and Physical  Patient: [name]  ZOX:[id number]  DOB: 1966-08-18  DOA: 07/29/2013  Referring physician: Dr. Rubin Payor PCP: [name], MD  Consults:     Chief Complaint: Shortness of breath and cough  HPI: [name] is a 47 y.o. female with Past medical history of multiple sclerosis, irritable bowel syndrome, depression, recurrent pancreatitis, Graves' disease, chronic methadone use. She presents today with the complaint of cough shortness of breath with fevers that has been ongoing since last to 3 days. She mentions that she has undergone an upper GI endoscopy 1 week ago after which she started having some tickling in her throat which progressed to sore throat and followed by dry cough which progressed to green yellow expectoration without any blood. She also complains of diffuse chest pain which is pleuritic in nature. She started having fever since yesterday at which time she went to an urgent care and was diagnosed with pneumonia and was started on Augmentin and azithromycin. Since despite taking the antibiotics she did not felt better and decided to come to the ER. At present she mentions that she continues to have shortness of breath and cough, the fever has improved, as well as the chest pain. She denies any aspiration, recent immobilization, leg swelling, sick contacts, healthcare system exposure.   Review of Systems: as mentioned in the history of present illness.  A Comprehensive review of the other systems is negative.  Past Medical History  Diagnosis Date  . Multiple sclerosis   . Irritable bowel syndrome   . Arthritis   . Pancreatitis   . Anxiety   . Depression   . Grave's disease   . Chronic pain   . Graves disease   . Migraine   . PTSD (post-traumatic stress disorder)   . Methadone use    Past Surgical History  Procedure Laterality Date  . Sphincterotomy    . Cholecystectomy     Social History:  reports that she has been  smoking.  She has never used smokeless tobacco. She reports that she does not drink alcohol or use illicit drugs. Patient is coming from home. Independent for most of her  ADL.  Allergies  Allergen Reactions  . Erythromycin Nausea And Vomiting  . Morphine And Related Other (See Comments)    whelps on arms at iv site    Family History  Problem Relation Age of Onset  . Cancer Mother   . Hypertension Father     Prior to Admission medications   Medication Sig Start Date End Date Taking? Authorizing Provider  albuterol (PROVENTIL HFA;VENTOLIN HFA) 108 (90 BASE) MCG/ACT inhaler Inhale 2 puffs into the lungs every 4 (four) hours as needed for wheezing or shortness of breath. 07/28/13  Yes Cathlyn Parsons, NP  azithromycin (ZITHROMAX Z-PAK) 250 MG tablet 500mg  po day one, then 250mg  qd for 4 days 07/28/13  Yes Cathlyn Parsons, NP  busPIRone (BUSPAR) 15 MG tablet Take 15 mg by mouth 2 (two) times daily.   Yes Historical Provider, MD  cyclobenzaprine (FLEXERIL) 10 MG tablet Take 10 mg by mouth 3 (three) times daily as needed. For muscle spasms.   Yes Historical Provider, MD  diclofenac (VOLTAREN) 75 MG EC tablet Take 75 mg by mouth daily.    Yes Historical Provider, MD  diclofenac sodium (VOLTAREN) 1 % GEL Apply 1 application topically 2 (two) times daily. Knees, hips, neck   Yes Historical Provider, MD  escitalopram (LEXAPRO) 10 MG tablet Take 30  mg by mouth daily.   Yes Historical Provider, MD  esomeprazole (NEXIUM) 20 MG capsule Take 20 mg by mouth daily before breakfast.   Yes Historical Provider, MD  imipramine (TOFRANIL) 50 MG tablet Take 100 mg by mouth at bedtime.    Yes Historical Provider, MD  levothyroxine (SYNTHROID, LEVOTHROID) 100 MCG tablet Take 100 mcg by mouth daily.   Yes Historical Provider, MD  Melatonin 3 MG TABS Take 1 tablet by mouth at bedtime as needed. For sleep   Yes Historical Provider, MD  methadone (DOLOPHINE) 10 MG/5ML solution Take 91 mg by mouth daily.    Yes  Historical Provider, MD  Multiple Vitamin (MULTIVITAMIN WITH MINERALS) TABS Take 1 tablet by mouth daily.   Yes Historical Provider, MD  potassium chloride (K-DUR,KLOR-CON) 10 MEQ tablet Take 10 mEq by mouth daily.   Yes Historical Provider, MD  amoxicillin (AMOXIL) 875 MG tablet Take 1 tablet (875 mg total) by mouth 2 (two) times daily. 07/28/13   Cathlyn Parsons, NP    Physical Exam: Filed Vitals:   07/29/13 2100 07/29/13 2230 07/29/13 2300 07/29/13 2338  BP: 112/61 101/50 91/54 133/67  Pulse: 90 85 83 90  Temp:    97.4 F (36.3 C)  TempSrc:    Axillary  Resp:    18  SpO2: 96% 96% 94% 96%    General: Alert, Awake and Oriented to Time, Place and Person. Appear in marked distress Eyes: PERRL ENT: Oral Mucosa clear dry. Neck: No  JVD, no  Carotid Bruits  Cardiovascular: S1 and S2 Present, apical systolic  Murmur, Peripheral Pulses Present Respiratory: Bilateral Air entry equal and Decreased, Diffuse bilateral rhonchi and Crackles,no  wheezes Abdomen: Bowel Sound Present, Soft and Non tender Skin: No  Rash Extremities: No  Pedal edema, no  calf tenderness Neurologic: Grossly Unremarkable.  Labs on Admission:  CBC:  Recent Labs Lab 07/29/13 2051  WBC 12.2*  HGB 13.1  HCT 36.8  MCV 89.5  PLT 301    CMP     Component Value Date/Time   NA 135 07/29/2013 2051   K 3.1* 07/29/2013 2051   CL 93* 07/29/2013 2051   CO2 30 07/29/2013 2051   GLUCOSE 90 07/29/2013 2051   BUN 9 07/29/2013 2051   CREATININE 0.50 07/29/2013 2051   CALCIUM 8.5 07/29/2013 2051   PROT 7.3 12/14/2012 1826   ALBUMIN 2.8* 12/14/2012 1826   AST 47* 12/14/2012 1826   ALT 25 12/14/2012 1826   ALKPHOS 197* 12/14/2012 1826   BILITOT 0.6 12/14/2012 1826   GFRNONAA >90 07/29/2013 2051   GFRAA >90 07/29/2013 2051    No results found for this basename: LIPASE, AMYLASE,  in the last 168 hours No results found for this basename: AMMONIA,  in the last 168 hours  Cardiac Enzymes: No results found for this basename: CKTOTAL,  CKMB, CKMBINDEX, TROPONINI,  in the last 168 hours  BNP (last 3 results)  Recent Labs  07/29/13 2104  PROBNP 74.9    Radiological Exams on Admission: Dg Chest 2 View  07/29/2013   CLINICAL DATA:  Shortness of breath. Chest pain with inspiration. Recently diagnosed with pneumonia. History of multiple sclerosis, irritable bowel syndrome, pancreatitis and Graves disease.  EXAM: CHEST  2 VIEW  COMPARISON:  07/28/2013 and 08/28/2012.  FINDINGS: The bilateral perihilar airspace opacities have not significantly changed from yesterday's examination. There is no significant pleural effusion. The heart size and mediastinal contours are stable with persistent fullness in the AP window which may  reflect lymphadenopathy. No acute osseous findings are seen. Cholecystectomy clips are again noted.  IMPRESSION: No significant change in perihilar airspace opacities and suspected AP window adenopathy compared with yesterday's examination. findings remain most consistent with pneumonia, although could reflect atypical edema or noninfectious inflammation. Continued radiographic follow up recommended.   Electronically Signed   By: Roxy Horseman   On: 07/29/2013 20:46   Dg Chest 2 View  07/28/2013   CLINICAL DATA:  Cough for 3 days. Cigarette smoker.  EXAM: CHEST  2 VIEW  COMPARISON:  08/28/2012.  FINDINGS: Cardiopericardial silhouette within normal limits for projection. There is bilateral perihilar airspace disease extending into both lower lobes. More focal airspace disease extends to the right upper lobe. There is no effusion. Cholecystectomy clips are present in the right upper quadrant. Trachea midline. Mediastinal contours demonstrate prominence in the AP window which may represent adenopathy.  Followup to ensure radiographic clearing and exclude an underlying lesion is recommended. Typically clearing will be observed at 8 weeks.  IMPRESSION: Diffuse perihilar airspace disease and more focal right upper lobe airspace  disease. This is favored to represent multifocal infection although CHF with asymmetric/atypical pulmonary edema could produce a similar appearance.   Electronically Signed   By: Andreas Newport M.D.   On: 07/28/2013 20:39    EKG: Independently reviewed. sinus rhythm, prolonged QTC.  Assessment/Plan Principal Problem:   CAP (community acquired pneumonia) Active Problems:   Opioid dependence   Graves' disease   Hypokalemia   1. CAP (community acquired pneumonia) The patient is presenting with complaints of cough fever chills, not responding to outpatient oral antibiotics, with acute hypoxia requiring oxygen therapy. She has been actively smoking since last 10 years, but does not carry a diagnosis of COPD, and her chest x-ray does not appear to show signs of emphysematous changes. There is an hilar prominence with interstitial pattern on chest x-ray, and her examination suggests bilateral rhonchi and crackles. I would treat her with IV ceftriaxone and IV azithromycin to cover the atypical organisms primary. I would also continue her on IV fluids. I will get blood cultures and sputum cultures. Considering her history of active smoking I will add albuterol inhalers and Flonase inhalers as needed. Mucinex will be provided for expectoration. Urine antigens will be obtained as well. If she does not respond to the IV antibiotics then she may benefit from addition of prednisone or a noncontrast CT of the chest for further workup. She may require a repeat EKG in the morning for monitoring of QTC.  2. Abdominal pain The patient has been having diffuse abdominal pain which is chronic for her, she has been on chronic methadone. 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 Recently she has undergone upper GI endoscopy 1 week ago which was suggestive of esophagitis and gastritis. I will continue her on diet as tolerated and put her on Protonix 40 mg daily.  3. Multiple  sclerosis Does not appear to have any flares at present. Continue to monitor.  4. Hypothyroidism and Graves' disease Continue Synthroid  5. Smoking Counseling was provided to the patient, nicotine patch at present  DVT Prophylaxis: subcutaneous Heparin Nutrition: Diet as tolerated  Code Status: Full  Disposition: Admitted to inpatient in telemetry.  Author: Lynden Oxford, MD [facility] Pager: [phone] 07/29/2013, 11:56 PM    If 7PM-7AM, please contact night-coverage [email or web address] Password [email or web address]

## 2013-07-29 NOTE — ED Notes (Signed)
IV attempted x2 in triage.

## 2013-07-30 LAB — COMPREHENSIVE METABOLIC PANEL
ALT: 18 U/L (ref 0–35)
Albumin: 2.6 g/dL — ABNORMAL LOW (ref 3.5–5.2)
Alkaline Phosphatase: 163 U/L — ABNORMAL HIGH (ref 39–117)
BUN: 7 mg/dL (ref 6–23)
CO2: 31 mEq/L (ref 19–32)
Chloride: 93 mEq/L — ABNORMAL LOW (ref 96–112)
Creatinine, Ser: 0.51 mg/dL (ref 0.50–1.10)
GFR calc Af Amer: >90 mL/min (ref >90)
GFR calc non Af Amer: >90 mL/min (ref >90)
Glucose, Bld: 121 mg/dL — ABNORMAL HIGH (ref 70–99)
Potassium: 2.2 mEq/L — CL (ref 3.5–5.1)
Sodium: 136 mEq/L (ref 135–145)
Total Bilirubin: 0.6 mg/dL (ref 0.3–1.2)

## 2013-07-30 LAB — EXPECTORATED SPUTUM ASSESSMENT W REFEX TO RESP CULTURE

## 2013-07-30 LAB — INFLUENZA PANEL BY PCR (TYPE A & B)
H1N1 flu by pcr: NOT DETECTED
Influenza A By PCR: NEGATIVE

## 2013-07-30 LAB — CBC WITH DIFFERENTIAL/PLATELET
Basophils Absolute: 0 10*3/uL (ref 0.0–0.1)
Eosinophils Absolute: 0 10*3/uL (ref 0.0–0.7)
Eosinophils Relative: 0 % (ref 0–5)
Lymphocytes Relative: 20 % (ref 12–46)
MCV: 89.7 fL (ref 78.0–100.0)
Monocytes Relative: 6 % (ref 3–12)
Neutro Abs: 7.3 10*3/uL (ref 1.7–7.7)
Neutrophils Relative %: 74 % (ref 43–77)
Platelets: 263 10*3/uL (ref 150–400)
RDW: 12.6 % (ref 11.5–15.5)
WBC: 9.9 10*3/uL (ref 4.0–10.5)

## 2013-07-30 LAB — EXPECTORATED SPUTUM ASSESSMENT W GRAM STAIN, RFLX TO RESP C

## 2013-07-30 LAB — BASIC METABOLIC PANEL
BUN: 6 mg/dL (ref 6–23)
Calcium: 8.1 mg/dL — ABNORMAL LOW (ref 8.4–10.5)
GFR calc non Af Amer: >90 mL/min (ref >90)
Glucose, Bld: 73 mg/dL (ref 70–99)

## 2013-07-30 LAB — LIPASE, BLOOD: Lipase: 11 U/L (ref 11–59)

## 2013-07-30 LAB — STREP PNEUMONIAE URINARY ANTIGEN: Strep Pneumo Urinary Antigen: NEGATIVE

## 2013-07-30 MED ORDER — PANTOPRAZOLE SODIUM 40 MG PO TBEC
40.0000 mg | DELAYED_RELEASE_TABLET | Freq: Every day | ORAL | Status: DC
Start: 2013-07-30 — End: 2013-08-04
  Administered 2013-07-30 – 2013-08-04 (×6): 40 mg via ORAL
  Filled 2013-07-30 (×6): qty 1

## 2013-07-30 MED ORDER — POTASSIUM CHLORIDE CRYS ER 20 MEQ PO TBCR
40.0000 meq | EXTENDED_RELEASE_TABLET | Freq: Once | ORAL | Status: AC
  Administered 2013-07-30: 40 meq via ORAL
  Filled 2013-07-30: qty 2

## 2013-07-30 MED ORDER — ONDANSETRON HCL 4 MG/2ML IJ SOLN
4.0000 mg | Freq: Four times a day (QID) | INTRAMUSCULAR | Status: DC | PRN
  Administered 2013-07-31 – 2013-08-02 (×5): 4 mg via INTRAVENOUS
  Filled 2013-07-30 (×5): qty 2

## 2013-07-30 MED ORDER — ESCITALOPRAM OXALATE 20 MG PO TABS
30.0000 mg | ORAL_TABLET | Freq: Every day | ORAL | Status: DC
  Administered 2013-07-30 – 2013-08-04 (×6): 30 mg via ORAL
  Filled 2013-07-30 (×6): qty 1

## 2013-07-30 MED ORDER — LEVOTHYROXINE SODIUM 100 MCG PO TABS
100.0000 ug | ORAL_TABLET | Freq: Every day | ORAL | Status: DC
  Administered 2013-07-30 – 2013-08-04 (×6): 100 ug via ORAL
  Filled 2013-07-30 (×8): qty 1

## 2013-07-30 MED ORDER — GUAIFENESIN ER 600 MG PO TB12
1200.0000 mg | ORAL_TABLET | Freq: Two times a day (BID) | ORAL | Status: DC
  Administered 2013-07-30 – 2013-08-03 (×11): 1200 mg via ORAL
  Filled 2013-07-30 (×14): qty 2

## 2013-07-30 MED ORDER — NICOTINE 14 MG/24HR TD PT24
14.0000 mg | MEDICATED_PATCH | Freq: Every day | TRANSDERMAL | Status: DC
  Administered 2013-07-30 – 2013-08-04 (×6): 14 mg via TRANSDERMAL
  Filled 2013-07-30 (×6): qty 1

## 2013-07-30 MED ORDER — CYCLOBENZAPRINE HCL 10 MG PO TABS
10.0000 mg | ORAL_TABLET | Freq: Three times a day (TID) | ORAL | Status: DC | PRN
  Administered 2013-07-30 – 2013-08-03 (×5): 10 mg via ORAL
  Filled 2013-07-30 (×5): qty 1

## 2013-07-30 MED ORDER — DEXTROSE 5 % IV SOLN
500.0000 mg | INTRAVENOUS | Status: DC
  Administered 2013-07-30: 500 mg via INTRAVENOUS
  Filled 2013-07-30: qty 500

## 2013-07-30 MED ORDER — IOHEXOL 300 MG/ML  SOLN
25.0000 mL | INTRAMUSCULAR | Status: AC
  Administered 2013-07-30 (×2): 25 mL via ORAL

## 2013-07-30 MED ORDER — METHADONE HCL 10 MG PO TABS
90.0000 mg | ORAL_TABLET | Freq: Every day | ORAL | Status: DC
  Administered 2013-07-30 – 2013-08-04 (×6): 90 mg via ORAL
  Filled 2013-07-30 (×6): qty 9

## 2013-07-30 MED ORDER — INFLUENZA VAC SPLIT QUAD 0.5 ML IM SUSP: 0.5000 mL | INTRAMUSCULAR | Status: DC

## 2013-07-30 MED ORDER — IMIPRAMINE HCL 50 MG PO TABS
100.0000 mg | ORAL_TABLET | Freq: Every day | ORAL | Status: DC
  Administered 2013-07-30 – 2013-08-03 (×7): 100 mg via ORAL
  Filled 2013-07-30 (×8): qty 2

## 2013-07-30 MED ORDER — BENZONATATE 100 MG PO CAPS
100.0000 mg | ORAL_CAPSULE | Freq: Three times a day (TID) | ORAL | Status: DC | PRN
  Administered 2013-07-31: 100 mg via ORAL
  Filled 2013-07-30: qty 1

## 2013-07-30 MED ORDER — INFLUENZA VAC SPLIT QUAD 0.5 ML IM SUSP
0.5000 mL | INTRAMUSCULAR | Status: AC
  Filled 2013-07-30: qty 0.5

## 2013-07-30 MED ORDER — ALBUTEROL SULFATE (5 MG/ML) 0.5% IN NEBU: 2.5000 mg | INHALATION_SOLUTION | RESPIRATORY_TRACT | Status: DC | PRN

## 2013-07-30 MED ORDER — DEXTROSE 5 % IV SOLN
1.0000 g | INTRAVENOUS | Status: DC
  Administered 2013-07-30 – 2013-08-03 (×5): 1 g via INTRAVENOUS
  Filled 2013-07-30 (×6): qty 10

## 2013-07-30 MED ORDER — ONDANSETRON HCL 4 MG/2ML IJ SOLN
INTRAMUSCULAR | Status: AC
  Administered 2013-07-30: 4 mg
  Filled 2013-07-30: qty 2

## 2013-07-30 MED ORDER — AZITHROMYCIN 500 MG PO TABS
500.0000 mg | ORAL_TABLET | Freq: Every day | ORAL | Status: AC
  Administered 2013-07-31 – 2013-08-03 (×6): 500 mg via ORAL
  Filled 2013-07-30 (×6): qty 1

## 2013-07-30 MED ORDER — ENOXAPARIN SODIUM 40 MG/0.4ML ~~LOC~~ SOLN
40.0000 mg | SUBCUTANEOUS | Status: DC
  Administered 2013-07-30 – 2013-08-04 (×6): 40 mg via SUBCUTANEOUS
  Filled 2013-07-30 (×6): qty 0.4

## 2013-07-30 MED ORDER — ONDANSETRON HCL 4 MG PO TABS
4.0000 mg | ORAL_TABLET | Freq: Four times a day (QID) | ORAL | Status: DC | PRN
  Administered 2013-08-03 – 2013-08-04 (×3): 4 mg via ORAL
  Filled 2013-07-30 (×3): qty 1

## 2013-07-30 MED ORDER — BUSPIRONE HCL 15 MG PO TABS
15.0000 mg | ORAL_TABLET | Freq: Two times a day (BID) | ORAL | Status: DC
  Administered 2013-07-30 – 2013-08-04 (×12): 15 mg via ORAL
  Filled 2013-07-30 (×13): qty 1

## 2013-07-30 MED ORDER — ALBUTEROL SULFATE HFA 108 (90 BASE) MCG/ACT IN AERS
2.0000 | INHALATION_SPRAY | RESPIRATORY_TRACT | Status: DC | PRN
  Filled 2013-07-30: qty 6.7

## 2013-07-30 MED ORDER — POTASSIUM CHLORIDE 20 MEQ PO PACK
40.0000 meq | PACK | Freq: Once | ORAL | Status: DC
  Filled 2013-07-30: qty 2

## 2013-07-30 MED ORDER — METHADONE HCL 10 MG/5ML PO SOLN: 91.0000 mg | Freq: Every day | ORAL | Status: DC

## 2013-07-30 MED ORDER — FLUTICASONE PROPIONATE 50 MCG/ACT NA SUSP
2.0000 | Freq: Every day | NASAL | Status: DC
  Administered 2013-07-30 – 2013-08-03 (×4): 2 via NASAL
  Filled 2013-07-30 (×2): qty 16

## 2013-07-30 MED ORDER — POTASSIUM CHLORIDE CRYS ER 20 MEQ PO TBCR
40.0000 meq | EXTENDED_RELEASE_TABLET | ORAL | Status: AC
  Administered 2013-07-30 (×3): 40 meq via ORAL
  Filled 2013-07-30 (×3): qty 2

## 2013-07-30 MED ORDER — POTASSIUM CHLORIDE 10 MEQ/100ML IV SOLN
10.0000 meq | INTRAVENOUS | Status: AC
  Administered 2013-07-30: 10 meq via INTRAVENOUS
  Filled 2013-07-30 (×3): qty 100

## 2013-07-30 MED ORDER — POTASSIUM CHLORIDE IN NACL 20-0.9 MEQ/L-% IV SOLN
INTRAVENOUS | Status: DC
  Administered 2013-07-30 – 2013-07-31 (×3): via INTRAVENOUS
  Administered 2013-07-31: 100 mL/h via INTRAVENOUS
  Filled 2013-07-30 (×5): qty 1000

## 2013-07-30 NOTE — Progress Notes (Signed)
At 1450, Flexeril given, per patient's request for stiffness in upper left thigh r/t MS. Patient continues to bring up thick  Mucus. Sputum sample to be sent. Oxygen 3L. Patient weak, short of breath. Clear liquids tolerated. Decreased intake. Sherlyn Lees, RN

## 2013-07-30 NOTE — Progress Notes (Addendum)
[facility] PROGRESS NOTE  [name] WUJ:[id number] DOB: 06-02-1966 DOA: 07/29/2013 PCP: [name], MD  Assessment/Plan:  47 y.o. female with Past medical history of multiple sclerosis, irritable bowel syndrome, depression, recurrent pancreatitis, Graves' disease, chronic methadone use admitted with pneumonia   1. CAP (community acquired pneumonia); There is an hilar prominence with interstitial pattern on chest x-ray, and her examination suggests bilateral rhonchi and crackles.  -afebrile; cont IV ceftriaxone and IV azithromycin to cover the atypical organisms primary. -based on CXR some edema likely CAP related; BNP not elevated; no clinical s/s of CHF; monitor   2. Abdominal pain  The patient has been having diffuse abdominal pain which is chronic for her, she has been on chronic methadone. 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 Recently she has undergone upper GI endoscopy 1 week ago which was suggestive of esophagitis and gastritis.  -cont her on diet as tolerated and put her on Protonix 40 mg daily.  -per GI: distal esophageal adenocarcinoma; ordered CT abd/pel/chest  3. Multiple sclerosis Does not appear to have any flares at present. Continue to monitor.   4. Hypothyroidism and Graves' disease Continue Synthroid   5. Smoking Counseling was provided to the patient, nicotine patch at present   6. Hypo K; replacing IV+PO; recheck today in the afternoon   Code Status: full  Family Communication: not present at the bedside  (indicate person spoken with, relationship, and if by phone, the number) Disposition Plan: home -d/w patient; informed that she is not medically ready to be discharged today    Consultants:  No   Procedures:  No   Antibiotics:  Cefriaxone+azithro 9/23<<< (indicate start date, and stop date if known)  HPI/Subjective: Feels better   Objective: Filed Vitals:   07/30/13 0551  BP: 110/67  Pulse: 85   Temp: 98.4 F (36.9 C)  Resp: 18    Intake/Output Summary (Last 24 hours) at 07/30/13 0820 Last data filed at 07/30/13 0700  Gross per 24 hour  Intake    680 ml  Output      0 ml  Net    680 ml   There were no vitals filed for this visit.  Exam:   General:  Alert, awake   Cardiovascular: S1, S2 RRR  Respiratory: LL crackles rhonchi  Abdomen: soft, NT ND   Musculoskeletal: no LE edema    Data Reviewed: Basic Metabolic Panel:  Recent Labs Lab 07/29/13 2051 07/30/13 0200  NA 135 136  K 3.1* 2.2*  CL 93* 93*  CO2 30 31  GLUCOSE 90 121*  BUN 9 7  CREATININE 0.50 0.51  CALCIUM 8.5 8.3*   Liver Function Tests:  Recent Labs Lab 07/30/13 0200  AST 37  ALT 18  ALKPHOS 163*  BILITOT 0.6  PROT 6.3  ALBUMIN 2.6*    Recent Labs Lab 07/30/13 0200  LIPASE 11   No results found for this basename: AMMONIA,  in the last 168 hours CBC:  Recent Labs Lab 07/29/13 2051 07/30/13 0200  WBC 12.2* 9.9  NEUTROABS  --  7.3  HGB 13.1 12.4  HCT 36.8 34.7*  MCV 89.5 89.7  PLT 301 263   Cardiac Enzymes: No results found for this basename: CKTOTAL, CKMB, CKMBINDEX, TROPONINI,  in the last 168 hours BNP (last 3 results)  Recent Labs  07/29/13 2104  PROBNP 74.9   CBG: No results found for this basename: GLUCAP,  in the last 168 hours  Recent Results (from the  past 240 hour(s))  CULTURE, EXPECTORATED SPUTUM-ASSESSMENT     Status: None   Collection Time    07/30/13  6:38 AM      Result Value Range Status   Specimen Description SPUTUM   Final   Special Requests NONE   Final   Sputum evaluation     Final   Value: MICROSCOPIC FINDINGS SUGGEST THAT THIS SPECIMEN IS NOT REPRESENTATIVE OF LOWER RESPIRATORY SECRETIONS. PLEASE RECOLLECT.     CALLED TO B.LABBATO,RN 07/30/13 0745 BY BSLADE   Report Status 07/30/2013 FINAL   Final     Studies: Dg Chest 2 View  07/29/2013   CLINICAL DATA:  Shortness of breath. Chest pain with inspiration. Recently diagnosed with  pneumonia. History of multiple sclerosis, irritable bowel syndrome, pancreatitis and Graves disease.  EXAM: CHEST  2 VIEW  COMPARISON:  07/28/2013 and 08/28/2012.  FINDINGS: The bilateral perihilar airspace opacities have not significantly changed from yesterday's examination. There is no significant pleural effusion. The heart size and mediastinal contours are stable with persistent fullness in the AP window which may reflect lymphadenopathy. No acute osseous findings are seen. Cholecystectomy clips are again noted.  IMPRESSION: No significant change in perihilar airspace opacities and suspected AP window adenopathy compared with yesterday's examination. findings remain most consistent with pneumonia, although could reflect atypical edema or noninfectious inflammation. Continued radiographic follow up recommended.   Electronically Signed   By: Roxy Horseman   On: 07/29/2013 20:46   Dg Chest 2 View  07/28/2013   CLINICAL DATA:  Cough for 3 days. Cigarette smoker.  EXAM: CHEST  2 VIEW  COMPARISON:  08/28/2012.  FINDINGS: Cardiopericardial silhouette within normal limits for projection. There is bilateral perihilar airspace disease extending into both lower lobes. More focal airspace disease extends to the right upper lobe. There is no effusion. Cholecystectomy clips are present in the right upper quadrant. Trachea midline. Mediastinal contours demonstrate prominence in the AP window which may represent adenopathy.  Followup to ensure radiographic clearing and exclude an underlying lesion is recommended. Typically clearing will be observed at 8 weeks.  IMPRESSION: Diffuse perihilar airspace disease and more focal right upper lobe airspace disease. This is favored to represent multifocal infection although CHF with asymmetric/atypical pulmonary edema could produce a similar appearance.   Electronically Signed   By: Andreas Newport M.D.   On: 07/28/2013 20:39    Scheduled Meds: . azithromycin  500 mg Intravenous  Q24H  . busPIRone  15 mg Oral BID  . cefTRIAXone (ROCEPHIN)  IV  1 g Intravenous Q24H  . enoxaparin (LOVENOX) injection  40 mg Subcutaneous Q24H  . escitalopram  30 mg Oral Daily  . fluticasone  2 spray Each Nare Daily  . guaiFENesin  1,200 mg Oral BID  . imipramine  100 mg Oral QHS  . [START ON 07/31/2013] influenza vac split quadrivalent PF  0.5 mL Intramuscular Tomorrow-1000  . levothyroxine  100 mcg Oral QAC breakfast  . methadone  90 mg Oral Daily  . nicotine  14 mg Transdermal Daily  . pantoprazole  40 mg Oral Daily  . potassium chloride  40 mEq Oral Q2H   Continuous Infusions: . 0.9 % NaCl with KCl 20 mEq / L 100 mL/hr at 07/30/13 0114    Principal Problem:   CAP (community acquired pneumonia) Active Problems:   Opioid dependence   Graves' disease   Hypokalemia    Time spent: > 35 minutes     Esperanza Sheets  [facility] Pager [phone]. If  7PM-7AM, please contact night-coverage at [email or web address], password [facility] 07/30/2013, 8:20 AM  LOS: 1 day

## 2013-07-30 NOTE — ED Provider Notes (Signed)
Medical screening examination/treatment/procedure(s) were performed by resident physician or non-physician practitioner and as supervising physician I was immediately available for consultation/collaboration.   KINDL,JAMES DOUGLAS MD.   James D Kindl, MD 07/30/13 1523 

## 2013-07-30 NOTE — Consult Note (Signed)
Reason for Consult: Esophageal adenocarcinoma Referring Physician: [facility]  [name] HPI: This is a 47 year old female who is well-known to me.  Last week she underwent an EGD for complaints of epigastric pain and dysphagia.  Prior to last week, her last EGD was in 2011 with negative findings.  She has a long history of abdominal pain and there is a history of acute pancreatitis in the past.  The EGD last week unexpected revealed a stenosed area in the distal esophagus that was friable and exhibited abnormal mucosa.  Her gastric lumen was also abnormal in appearance.  The outside pathologist [facility]) call me late yesterday with the findings.  She was admitted to the hospital for a pneumonia and there is a possibility she had an aspiration pneumonia.  Past Medical History  Diagnosis Date  . Multiple sclerosis   . Irritable bowel syndrome   . Arthritis   . Pancreatitis   . Anxiety   . Depression   . Grave's disease   . Chronic pain   . Graves disease   . Migraine   . PTSD (post-traumatic stress disorder)   . Methadone use     Past Surgical History  Procedure Laterality Date  . Sphincterotomy    . Cholecystectomy      Family History  Problem Relation Age of Onset  . Cancer Mother   . Hypertension Father     Social History:  reports that she has been smoking.  She has never used smokeless tobacco. She reports that she does not drink alcohol or use illicit drugs.  Allergies:  Allergies  Allergen Reactions  . Erythromycin Nausea And Vomiting  . Morphine And Related Other (See Comments)    whelps on arms at iv site    Medications:  Scheduled: . azithromycin  500 mg Oral QHS  . busPIRone  15 mg Oral BID  . cefTRIAXone (ROCEPHIN)  IV  1 g Intravenous Q24H  . enoxaparin (LOVENOX) injection  40 mg Subcutaneous Q24H  . escitalopram  30 mg Oral Daily  . fluticasone  2 spray Each Nare Daily  . guaiFENesin  1,200 mg Oral BID  . imipramine  100 mg Oral  QHS  . [START ON 07/31/2013] influenza vac split quadrivalent PF  0.5 mL Intramuscular Tomorrow-1000  . levothyroxine  100 mcg Oral QAC breakfast  . methadone  90 mg Oral Daily  . nicotine  14 mg Transdermal Daily  . pantoprazole  40 mg Oral Daily  . potassium chloride  40 mEq Oral Q2H   Continuous: . 0.9 % NaCl with KCl 20 mEq / L 100 mL/hr at 07/30/13 0114    Results for orders placed during the hospital encounter of 07/29/13 (from the past 24 hour(s))  CBC     Status: Abnormal   Collection Time    07/29/13  8:51 PM      Result Value Range   WBC 12.2 (*) 4.0 - 10.5 K/uL   RBC 4.11  3.87 - 5.11 MIL/uL   Hemoglobin 13.1  12.0 - 15.0 g/dL   HCT 57.8  46.9 - 62.9 %   MCV 89.5  78.0 - 100.0 fL   MCH 31.9  26.0 - 34.0 pg   MCHC 35.6  30.0 - 36.0 g/dL   RDW 52.8  41.3 - 24.4 %   Platelets 301  150 - 400 K/uL  BASIC METABOLIC PANEL     Status: Abnormal   Collection Time    07/29/13  8:51 PM      Result Value Range   Sodium 135  135 - 145 mEq/L   Potassium 3.1 (*) 3.5 - 5.1 mEq/L   Chloride 93 (*) 96 - 112 mEq/L   CO2 30  19 - 32 mEq/L   Glucose, Bld 90  70 - 99 mg/dL   BUN 9  6 - 23 mg/dL   Creatinine, Ser 4.01  0.50 - 1.10 mg/dL   Calcium 8.5  8.4 - 02.7 mg/dL   GFR calc non Af Amer >90  >90 mL/min   GFR calc Af Amer >90  >90 mL/min  PRO B NATRIURETIC PEPTIDE     Status: None   Collection Time    07/29/13  9:04 PM      Result Value Range   Pro B Natriuretic peptide (BNP) 74.9  0 - 125 pg/mL  POCT I-STAT TROPONIN I     Status: None   Collection Time    07/29/13  9:09 PM      Result Value Range   Troponin i, poc 0.00  0.00 - 0.08 ng/mL   Comment 3           COMPREHENSIVE METABOLIC PANEL     Status: Abnormal   Collection Time    07/30/13  2:00 AM      Result Value Range   Sodium 136  135 - 145 mEq/L   Potassium 2.2 (*) 3.5 - 5.1 mEq/L   Chloride 93 (*) 96 - 112 mEq/L   CO2 31  19 - 32 mEq/L   Glucose, Bld 121 (*) 70 - 99 mg/dL   BUN 7  6 - 23 mg/dL   Creatinine,  Ser 2.53  0.50 - 1.10 mg/dL   Calcium 8.3 (*) 8.4 - 10.5 mg/dL   Total Protein 6.3  6.0 - 8.3 g/dL   Albumin 2.6 (*) 3.5 - 5.2 g/dL   AST 37  0 - 37 U/L   ALT 18  0 - 35 U/L   Alkaline Phosphatase 163 (*) 39 - 117 U/L   Total Bilirubin 0.6  0.3 - 1.2 mg/dL   GFR calc non Af Amer >90  >90 mL/min   GFR calc Af Amer >90  >90 mL/min  CBC WITH DIFFERENTIAL     Status: Abnormal   Collection Time    07/30/13  2:00 AM      Result Value Range   WBC 9.9  4.0 - 10.5 K/uL   RBC 3.87  3.87 - 5.11 MIL/uL   Hemoglobin 12.4  12.0 - 15.0 g/dL   HCT 66.4 (*) 40.3 - 47.4 %   MCV 89.7  78.0 - 100.0 fL   MCH 32.0  26.0 - 34.0 pg   MCHC 35.7  30.0 - 36.0 g/dL   RDW 25.9  56.3 - 87.5 %   Platelets 263  150 - 400 K/uL   Neutrophils Relative % 74  43 - 77 %   Neutro Abs 7.3  1.7 - 7.7 K/uL   Lymphocytes Relative 20  12 - 46 %   Lymphs Abs 2.0  0.7 - 4.0 K/uL   Monocytes Relative 6  3 - 12 %   Monocytes Absolute 0.6  0.1 - 1.0 K/uL   Eosinophils Relative 0  0 - 5 %   Eosinophils Absolute 0.0  0.0 - 0.7 K/uL   Basophils Relative 0  0 - 1 %   Basophils Absolute 0.0  0.0 - 0.1 K/uL  LIPASE, BLOOD  Status: None   Collection Time    07/30/13  2:00 AM      Result Value Range   Lipase 11  11 - 59 U/L  STREP PNEUMONIAE URINARY ANTIGEN     Status: None   Collection Time    07/30/13  5:57 AM      Result Value Range   Strep Pneumo Urinary Antigen NEGATIVE  NEGATIVE  CULTURE, EXPECTORATED SPUTUM-ASSESSMENT     Status: None   Collection Time    07/30/13  6:38 AM      Result Value Range   Specimen Description SPUTUM     Special Requests NONE     Sputum evaluation       Value: MICROSCOPIC FINDINGS SUGGEST THAT THIS SPECIMEN IS NOT REPRESENTATIVE OF LOWER RESPIRATORY SECRETIONS. PLEASE RECOLLECT.     CALLED TO B.LABBATO,RN 07/30/13 0745 BY BSLADE   Report Status 07/30/2013 FINAL    INFLUENZA PANEL BY PCR     Status: None   Collection Time    07/30/13  8:27 AM      Result Value Range   Influenza A By  PCR NEGATIVE  NEGATIVE   Influenza B By PCR NEGATIVE  NEGATIVE   H1N1 flu by pcr NOT DETECTED  NOT DETECTED     Dg Chest 2 View  07/29/2013   CLINICAL DATA:  Shortness of breath. Chest pain with inspiration. Recently diagnosed with pneumonia. History of multiple sclerosis, irritable bowel syndrome, pancreatitis and Graves disease.  EXAM: CHEST  2 VIEW  COMPARISON:  07/28/2013 and 08/28/2012.  FINDINGS: The bilateral perihilar airspace opacities have not significantly changed from yesterday's examination. There is no significant pleural effusion. The heart size and mediastinal contours are stable with persistent fullness in the AP window which may reflect lymphadenopathy. No acute osseous findings are seen. Cholecystectomy clips are again noted.  IMPRESSION: No significant change in perihilar airspace opacities and suspected AP window adenopathy compared with yesterday's examination. findings remain most consistent with pneumonia, although could reflect atypical edema or noninfectious inflammation. Continued radiographic follow up recommended.   Electronically Signed   By: Roxy Horseman   On: 07/29/2013 20:46   Dg Chest 2 View  07/28/2013   CLINICAL DATA:  Cough for 3 days. Cigarette smoker.  EXAM: CHEST  2 VIEW  COMPARISON:  08/28/2012.  FINDINGS: Cardiopericardial silhouette within normal limits for projection. There is bilateral perihilar airspace disease extending into both lower lobes. More focal airspace disease extends to the right upper lobe. There is no effusion. Cholecystectomy clips are present in the right upper quadrant. Trachea midline. Mediastinal contours demonstrate prominence in the AP window which may represent adenopathy.  Followup to ensure radiographic clearing and exclude an underlying lesion is recommended. Typically clearing will be observed at 8 weeks.  IMPRESSION: Diffuse perihilar airspace disease and more focal right upper lobe airspace disease. This is favored to represent  multifocal infection although CHF with asymmetric/atypical pulmonary edema could produce a similar appearance.   Electronically Signed   By: Andreas Newport M.D.   On: 07/28/2013 20:39    ROS:  As stated above in the HPI otherwise negative.  Blood pressure 110/67, pulse 85, temperature 98.4 F (36.9 C), temperature source Axillary, resp. rate 18, last menstrual period 04/28/2011, SpO2 98.00%.    PE: Gen: NAD, Alert and Oriented HEENT:  [place]/AT, EOMI Neck: Supple, no LAD Lungs: CTA Bilaterally CV: RRR without M/G/R ABM: Soft, NTND, +BS Ext: No C/C/E  Assessment/Plan: 1) Distal esophageal adenocarcinoma. 2) Pneumonia.  I will provide the official pathology report when I receive the document.  She requires further work up with imaging.  At this time I will order a CT scan of the Chest/ABM/Pelvis.  Once her pneumonia improves and she is not SOB an EUS will be performed to further stage the lesion.  Plan: 1) CT scan today. 2) Continue with antibiotics.  Markella Dao D 07/30/2013, 1:00 PM

## 2013-07-30 NOTE — Progress Notes (Signed)
UR COMPLETED  

## 2013-07-31 ENCOUNTER — Inpatient Hospital Stay (HOSPITAL_COMMUNITY): Payer: Medicare Other

## 2013-07-31 LAB — BASIC METABOLIC PANEL
CO2: 25 mEq/L (ref 19–32)
Calcium: 8.7 mg/dL (ref 8.4–10.5)
Creatinine, Ser: 0.45 mg/dL — ABNORMAL LOW (ref 0.50–1.10)
GFR calc Af Amer: >90 mL/min (ref >90)
GFR calc non Af Amer: >90 mL/min (ref >90)
Glucose, Bld: 74 mg/dL (ref 70–99)
Potassium: 3.9 mEq/L (ref 3.5–5.1)

## 2013-07-31 LAB — LEGIONELLA ANTIGEN, URINE: Legionella Antigen, Urine: NEGATIVE

## 2013-07-31 MED ORDER — IOHEXOL 300 MG/ML  SOLN
80.0000 mL | Freq: Once | INTRAMUSCULAR | Status: AC | PRN
  Administered 2013-07-31: 80 mL via INTRAVENOUS

## 2013-07-31 NOTE — Progress Notes (Signed)
Subjective: No acute events.    Objective: Vital signs in last 24 hours: Temp:  [98.1 F (36.7 C)-98.6 F (37 C)] 98.1 F (36.7 C) (09/25 0449) Pulse Rate:  [81-87] 87 (09/25 0449) Resp:  [16-18] 16 (09/25 0449) BP: (108-114)/(60-97) 114/60 mmHg (09/25 0449) SpO2:  [95 %-98 %] 95 % (09/25 0449) Last BM Date: 07/30/13  Intake/Output from previous day: 09/24 0701 - 09/25 0700 In: 500 [I.V.:500] Out: -  Intake/Output this shift:    General appearance: alert and no distress GI: diffuse tenderness.  Lab Results:  Recent Labs  07/29/13 2051 07/30/13 0200  WBC 12.2* 9.9  HGB 13.1 12.4  HCT 36.8 34.7*  PLT 301 263   BMET  Recent Labs  07/30/13 0200 07/30/13 1742 07/31/13 1000  NA 136 138 133*  K 2.2* 3.8 3.9  CL 93* 102 99  CO2 31 25 25   GLUCOSE 121* 73 74  BUN 7 6 4*  CREATININE 0.51 0.46* 0.45*  CALCIUM 8.3* 8.1* 8.7   LFT  Recent Labs  07/30/13 0200  PROT 6.3  ALBUMIN 2.6*  AST 37  ALT 18  ALKPHOS 163*  BILITOT 0.6   PT/INR No results found for this basename: LABPROT, INR,  in the last 72 hours Hepatitis Panel No results found for this basename: HEPBSAG, HCVAB, HEPAIGM, HEPBIGM,  in the last 72 hours C-Diff No results found for this basename: CDIFFTOX,  in the last 72 hours Fecal Lactopherrin No results found for this basename: FECLLACTOFRN,  in the last 72 hours  Studies/Results: Dg Chest 2 View  07/29/2013   CLINICAL DATA:  Shortness of breath. Chest pain with inspiration. Recently diagnosed with pneumonia. History of multiple sclerosis, irritable bowel syndrome, pancreatitis and Graves disease.  EXAM: CHEST  2 VIEW  COMPARISON:  07/28/2013 and 08/28/2012.  FINDINGS: The bilateral perihilar airspace opacities have not significantly changed from yesterday's examination. There is no significant pleural effusion. The heart size and mediastinal contours are stable with persistent fullness in the AP window which may reflect lymphadenopathy. No acute  osseous findings are seen. Cholecystectomy clips are again noted.  IMPRESSION: No significant change in perihilar airspace opacities and suspected AP window adenopathy compared with yesterday's examination. findings remain most consistent with pneumonia, although could reflect atypical edema or noninfectious inflammation. Continued radiographic follow up recommended.   Electronically Signed   By: Roxy Horseman   On: 07/29/2013 20:46    Medications:  Scheduled: . azithromycin  500 mg Oral QHS  . busPIRone  15 mg Oral BID  . cefTRIAXone (ROCEPHIN)  IV  1 g Intravenous Q24H  . enoxaparin (LOVENOX) injection  40 mg Subcutaneous Q24H  . escitalopram  30 mg Oral Daily  . fluticasone  2 spray Each Nare Daily  . guaiFENesin  1,200 mg Oral BID  . imipramine  100 mg Oral QHS  . influenza vac split quadrivalent PF  0.5 mL Intramuscular Tomorrow-1000  . levothyroxine  100 mcg Oral QAC breakfast  . methadone  90 mg Oral Daily  . nicotine  14 mg Transdermal Daily  . pantoprazole  40 mg Oral Daily   Continuous: . 0.9 % NaCl with KCl 20 mEq / L 100 mL/hr (07/31/13 0446)    Assessment/Plan: 1) Distal esophageal adenocarcinoma. 2) CAP.   The patient is to undergo the CT scans today.  She did not have adequate IV access yesterday.  Plan: 1) Await scans. 2) Continue with IV antibiotics.   LOS: 2 days   Estell Puccini D 07/31/2013,  12:34 PM

## 2013-07-31 NOTE — Progress Notes (Signed)
[facility] PROGRESS NOTE  [name] WUJ:[id number] DOB: 1966/02/23 DOA: 07/29/2013 PCP: [name], MD  Assessment/Plan:  47 y.o. female with Past medical history of multiple sclerosis, irritable bowel syndrome, depression, recurrent pancreatitis, Graves' disease, chronic methadone use admitted with pneumonia   1. CAP (community acquired pneumonia); There is an hilar prominence with interstitial pattern on chest x-ray, and her examination suggests bilateral rhonchi and crackles.  -afebrile; cont IV ceftriaxone and IV azithromycin to cover the atypical organisms primary. -based on CXR some edema likely CAP related; BNP not elevated; no clinical s/s of CHF; monitor   2. Abdominal pain  The patient has been having diffuse abdominal pain which is chronic for her, she has been on chronic methadone. 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 Recently she has undergone upper GI endoscopy 1 week ago which was suggestive of esophagitis and gastritis.  -cont her on diet as tolerated and put her on Protonix 40 mg daily.  -per GI: distal esophageal adenocarcinoma; ordered CT abd/pel/chest pend due to IV issues; ordered PICC  3. Multiple sclerosis Does not appear to have any flares at present. Continue to monitor.   4. Hypothyroidism and Graves' disease Continue Synthroid   5. Smoking Counseling was provided to the patient, nicotine patch at present   6. Hypo K; replacing IV+PO; improved   Code Status: full  Family Communication: not present at the bedside  (indicate person spoken with, relationship, and if by phone, the number) Disposition Plan: home -d/w patient; informed that she is not medically ready to be discharged today    Consultants:  No   Procedures:  No   Antibiotics:  Cefriaxone+azithro 9/23<<< (indicate start date, and stop date if known)  HPI/Subjective: Feels better   Objective: Filed Vitals:   07/31/13 0449  BP: 114/60   Pulse: 87  Temp: 98.1 F (36.7 C)  Resp: 16    Intake/Output Summary (Last 24 hours) at 07/31/13 1122 Last data filed at 07/30/13 1200  Gross per 24 hour  Intake    500 ml  Output      0 ml  Net    500 ml   There were no vitals filed for this visit.  Exam:   General:  Alert, awake   Cardiovascular: S1, S2 RRR  Respiratory: LL crackles rhonchi  Abdomen: soft, NT ND   Musculoskeletal: no LE edema    Data Reviewed: Basic Metabolic Panel:  Recent Labs Lab 07/29/13 2051 07/30/13 0200 07/30/13 1742 07/31/13 1000  NA 135 136 138 133*  K 3.1* 2.2* 3.8 3.9  CL 93* 93* 102 99  CO2 30 31 25 25   GLUCOSE 90 121* 73 74  BUN 9 7 6  4*  CREATININE 0.50 0.51 0.46* 0.45*  CALCIUM 8.5 8.3* 8.1* 8.7   Liver Function Tests:  Recent Labs Lab 07/30/13 0200  AST 37  ALT 18  ALKPHOS 163*  BILITOT 0.6  PROT 6.3  ALBUMIN 2.6*    Recent Labs Lab 07/30/13 0200  LIPASE 11   No results found for this basename: AMMONIA,  in the last 168 hours CBC:  Recent Labs Lab 07/29/13 2051 07/30/13 0200  WBC 12.2* 9.9  NEUTROABS  --  7.3  HGB 13.1 12.4  HCT 36.8 34.7*  MCV 89.5 89.7  PLT 301 263   Cardiac Enzymes: No results found for this basename: CKTOTAL, CKMB, CKMBINDEX, TROPONINI,  in the last 168 hours BNP (last 3 results)  Recent Labs  07/29/13 2104  PROBNP 74.9   CBG: No results found for this basename: GLUCAP,  in the last 168 hours  Recent Results (from the past 240 hour(s))  CULTURE, BLOOD (ROUTINE X 2)     Status: None   Collection Time    07/30/13  1:50 AM      Result Value Range Status   Specimen Description BLOOD LEFT ARM   Final   Special Requests BOTTLES DRAWN AEROBIC ONLY 4CC   Final   Culture  Setup Time     Final   Value: 07/30/2013 10:02     Performed at [facility]   Culture     Final   Value:        BLOOD CULTURE RECEIVED NO GROWTH TO DATE CULTURE WILL BE HELD FOR 5 DAYS BEFORE ISSUING A FINAL NEGATIVE REPORT     Performed at  [facility]   Report Status PENDING   Incomplete  CULTURE, BLOOD (ROUTINE X 2)     Status: None   Collection Time    07/30/13  2:00 AM      Result Value Range Status   Specimen Description BLOOD LEFT HAND   Final   Special Requests BOTTLES DRAWN AEROBIC ONLY 5CC   Final   Culture  Setup Time     Final   Value: 07/30/2013 10:02     Performed at [facility]   Culture     Final   Value:        BLOOD CULTURE RECEIVED NO GROWTH TO DATE CULTURE WILL BE HELD FOR 5 DAYS BEFORE ISSUING A FINAL NEGATIVE REPORT     Performed at [facility]   Report Status PENDING   Incomplete  CULTURE, EXPECTORATED SPUTUM-ASSESSMENT     Status: None   Collection Time    07/30/13  6:38 AM      Result Value Range Status   Specimen Description SPUTUM   Final   Special Requests NONE   Final   Sputum evaluation     Final   Value: MICROSCOPIC FINDINGS SUGGEST THAT THIS SPECIMEN IS NOT REPRESENTATIVE OF LOWER RESPIRATORY SECRETIONS. PLEASE RECOLLECT.     CALLED TO B.LABBATO,RN 07/30/13 0745 BY BSLADE   Report Status 07/30/2013 FINAL   Final  CULTURE, EXPECTORATED SPUTUM-ASSESSMENT     Status: None   Collection Time    07/30/13  9:22 PM      Result Value Range Status   Specimen Description SPUTUM   Final   Special Requests NONE   Final   Sputum evaluation     Final   Value: THIS SPECIMEN IS ACCEPTABLE. RESPIRATORY CULTURE REPORT TO FOLLOW.   Report Status 07/31/2013 FINAL   Final  CULTURE, RESPIRATORY (NON-EXPECTORATED)     Status: None   Collection Time    07/30/13  9:22 PM      Result Value Range Status   Specimen Description SPUTUM   Final   Special Requests NONE   Final   Gram Stain     Final   Value: FEW WBC PRESENT,BOTH PMN AND MONONUCLEAR     RARE SQUAMOUS EPITHELIAL CELLS PRESENT     RARE GRAM POSITIVE COCCI     IN PAIRS     Performed at [facility]   Culture PENDING   Incomplete   Report Status PENDING   Incomplete     Studies: Dg Chest 2 View  07/29/2013    CLINICAL DATA:  Shortness of breath. Chest pain with inspiration. Recently  diagnosed with pneumonia. History of multiple sclerosis, irritable bowel syndrome, pancreatitis and Graves disease.  EXAM: CHEST  2 VIEW  COMPARISON:  07/28/2013 and 08/28/2012.  FINDINGS: The bilateral perihilar airspace opacities have not significantly changed from yesterday's examination. There is no significant pleural effusion. The heart size and mediastinal contours are stable with persistent fullness in the AP window which may reflect lymphadenopathy. No acute osseous findings are seen. Cholecystectomy clips are again noted.  IMPRESSION: No significant change in perihilar airspace opacities and suspected AP window adenopathy compared with yesterday's examination. findings remain most consistent with pneumonia, although could reflect atypical edema or noninfectious inflammation. Continued radiographic follow up recommended.   Electronically Signed   By: Roxy Horseman   On: 07/29/2013 20:46    Scheduled Meds: . azithromycin  500 mg Oral QHS  . busPIRone  15 mg Oral BID  . cefTRIAXone (ROCEPHIN)  IV  1 g Intravenous Q24H  . enoxaparin (LOVENOX) injection  40 mg Subcutaneous Q24H  . escitalopram  30 mg Oral Daily  . fluticasone  2 spray Each Nare Daily  . guaiFENesin  1,200 mg Oral BID  . imipramine  100 mg Oral QHS  . influenza vac split quadrivalent PF  0.5 mL Intramuscular Tomorrow-1000  . levothyroxine  100 mcg Oral QAC breakfast  . methadone  90 mg Oral Daily  . nicotine  14 mg Transdermal Daily  . pantoprazole  40 mg Oral Daily   Continuous Infusions: . 0.9 % NaCl with KCl 20 mEq / L 100 mL/hr (07/31/13 0446)    Principal Problem:   CAP (community acquired pneumonia) Active Problems:   Opioid dependence   Graves' disease   Hypokalemia    Time spent: > 35 minutes     Esperanza Sheets  [facility] Pager [phone]. If 7PM-7AM, please contact night-coverage at [email or web address], password  [facility] 07/31/2013, 11:22 AM  LOS: 2 days

## 2013-07-31 NOTE — Progress Notes (Signed)
Spoke with Dr. York Spaniel regarding patient's tele status and transport to CT, new order to cancel telemetry monitoring.  New orders to cancel fluids and cancel previous order for PICC line as appropriate size IV gauge has been placed this am.

## 2013-08-01 ENCOUNTER — Inpatient Hospital Stay (HOSPITAL_COMMUNITY): Payer: Medicare Other

## 2013-08-01 DIAGNOSIS — C159 Malignant neoplasm of esophagus, unspecified: Secondary | ICD-10-CM

## 2013-08-01 MED ORDER — SENNA 8.6 MG PO TABS
1.0000 | ORAL_TABLET | Freq: Every day | ORAL | Status: DC
  Filled 2013-08-01 (×4): qty 1

## 2013-08-01 MED ORDER — MORPHINE SULFATE 2 MG/ML IJ SOLN: 1.0000 mg | Freq: Once | INTRAMUSCULAR | Status: DC

## 2013-08-01 MED ORDER — DOCUSATE SODIUM 100 MG PO CAPS
100.0000 mg | ORAL_CAPSULE | Freq: Two times a day (BID) | ORAL | Status: DC
  Administered 2013-08-02: 100 mg via ORAL
  Filled 2013-08-01 (×6): qty 1

## 2013-08-01 MED ORDER — HYDROMORPHONE HCL PF 1 MG/ML IJ SOLN
0.5000 mg | Freq: Once | INTRAMUSCULAR | Status: AC
  Administered 2013-08-01: 0.5 mg via INTRAVENOUS
  Filled 2013-08-01: qty 1

## 2013-08-01 NOTE — Progress Notes (Signed)
[facility] PROGRESS NOTE  [name] JYN:[id number] DOB: 10-14-1966 DOA: 07/29/2013 PCP: [name], MD  Assessment/Plan:  47 y.o. female with Past medical history of multiple sclerosis, irritable bowel syndrome, depression, recurrent pancreatitis, Graves' disease, chronic methadone use admitted with pneumonia, and metastatic CA  1. CAP (community acquired pneumonia); There is an hilar prominence with interstitial pattern on chest x-ray, and her examination suggests bilateral rhonchi and crackles.  -improving, cont IV ceftriaxone and IV azithromycin to cover the atypical organisms primary. -based on CXR some edema likely CAP related; BNP not elevated; no clinical s/s of CHF; monitor   2. Metastatic adenocarcinoma esophageal; s/p EGD;   -CT: mass in the esophagus, gastroesophageal junction and proximal stomach; Mild mediastinal adenopathy. This could be metastatic or reactive with pneumonia; Metastatic upper abdominal adenopathy -GI involved; c/ oncology   3. Abdominal pain chronic on methadone. 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 -cont methadone   3. Multiple sclerosis Does not appear to have any flares at present. Continue to monitor.   4. Hypothyroidism and Graves' disease Continue Synthroid   5. Smoking Counseling was provided to the patient, nicotine patch at present   6. Hypo K; replacing IV+PO; improved   Code Status: full  Family Communication: not present at the bedside  (indicate person spoken with, relationship, and if by phone, the number) Disposition Plan: home -d/w patient; informed that she is not medically ready to be discharged today    Consultants:  No   Procedures:  No   Antibiotics:  Cefriaxone+azithro 9/23<<< (indicate start date, and stop date if known)  HPI/Subjective: Feels better   Objective: Filed Vitals:   08/01/13 0504  BP: 117/58  Pulse: 85  Temp: 98.1 F (36.7 C)  Resp: 16     Intake/Output Summary (Last 24 hours) at 08/01/13 1143 Last data filed at 08/01/13 0700  Gross per 24 hour  Intake    480 ml  Output      0 ml  Net    480 ml   There were no vitals filed for this visit.  Exam:   General:  Alert, awake   Cardiovascular: S1, S2 RRR  Respiratory: LL crackles rhonchi  Abdomen: soft, NT ND   Musculoskeletal: no LE edema    Data Reviewed: Basic Metabolic Panel:  Recent Labs Lab 07/29/13 2051 07/30/13 0200 07/30/13 1742 07/31/13 1000  NA 135 136 138 133*  K 3.1* 2.2* 3.8 3.9  CL 93* 93* 102 99  CO2 30 31 25 25   GLUCOSE 90 121* 73 74  BUN 9 7 6  4*  CREATININE 0.50 0.51 0.46* 0.45*  CALCIUM 8.5 8.3* 8.1* 8.7   Liver Function Tests:  Recent Labs Lab 07/30/13 0200  AST 37  ALT 18  ALKPHOS 163*  BILITOT 0.6  PROT 6.3  ALBUMIN 2.6*    Recent Labs Lab 07/30/13 0200  LIPASE 11   No results found for this basename: AMMONIA,  in the last 168 hours CBC:  Recent Labs Lab 07/29/13 2051 07/30/13 0200  WBC 12.2* 9.9  NEUTROABS  --  7.3  HGB 13.1 12.4  HCT 36.8 34.7*  MCV 89.5 89.7  PLT 301 263   Cardiac Enzymes: No results found for this basename: CKTOTAL, CKMB, CKMBINDEX, TROPONINI,  in the last 168 hours BNP (last 3 results)  Recent Labs  07/29/13 2104  PROBNP 74.9   CBG: No results found for this basename: GLUCAP,  in the last 168 hours  Recent Results (  from the past 240 hour(s))  CULTURE, BLOOD (ROUTINE X 2)     Status: None   Collection Time    07/30/13  1:50 AM      Result Value Range Status   Specimen Description BLOOD LEFT ARM   Final   Special Requests BOTTLES DRAWN AEROBIC ONLY 4CC   Final   Culture  Setup Time     Final   Value: 07/30/2013 10:02     Performed at [facility]   Culture     Final   Value:        BLOOD CULTURE RECEIVED NO GROWTH TO DATE CULTURE WILL BE HELD FOR 5 DAYS BEFORE ISSUING A FINAL NEGATIVE REPORT     Performed at [facility]   Report Status PENDING    Incomplete  CULTURE, BLOOD (ROUTINE X 2)     Status: None   Collection Time    07/30/13  2:00 AM      Result Value Range Status   Specimen Description BLOOD LEFT HAND   Final   Special Requests BOTTLES DRAWN AEROBIC ONLY 5CC   Final   Culture  Setup Time     Final   Value: 07/30/2013 10:02     Performed at [facility]   Culture     Final   Value:        BLOOD CULTURE RECEIVED NO GROWTH TO DATE CULTURE WILL BE HELD FOR 5 DAYS BEFORE ISSUING A FINAL NEGATIVE REPORT     Performed at [facility]   Report Status PENDING   Incomplete  CULTURE, EXPECTORATED SPUTUM-ASSESSMENT     Status: None   Collection Time    07/30/13  6:38 AM      Result Value Range Status   Specimen Description SPUTUM   Final   Special Requests NONE   Final   Sputum evaluation     Final   Value: MICROSCOPIC FINDINGS SUGGEST THAT THIS SPECIMEN IS NOT REPRESENTATIVE OF LOWER RESPIRATORY SECRETIONS. PLEASE RECOLLECT.     CALLED TO B.LABBATO,RN 07/30/13 0745 BY BSLADE   Report Status 07/30/2013 FINAL   Final  CULTURE, EXPECTORATED SPUTUM-ASSESSMENT     Status: None   Collection Time    07/30/13  9:22 PM      Result Value Range Status   Specimen Description SPUTUM   Final   Special Requests NONE   Final   Sputum evaluation     Final   Value: THIS SPECIMEN IS ACCEPTABLE. RESPIRATORY CULTURE REPORT TO FOLLOW.   Report Status 07/31/2013 FINAL   Final  CULTURE, RESPIRATORY (NON-EXPECTORATED)     Status: None   Collection Time    07/30/13  9:22 PM      Result Value Range Status   Specimen Description SPUTUM   Final   Special Requests NONE   Final   Gram Stain     Final   Value: FEW WBC PRESENT,BOTH PMN AND MONONUCLEAR     RARE SQUAMOUS EPITHELIAL CELLS PRESENT     RARE GRAM POSITIVE COCCI     IN PAIRS     Performed at [facility]   Culture     Final   Value: FEW CANDIDA ALBICANS     Performed at [facility]   Report Status PENDING   Incomplete     Studies: Dg Abd 1  View  08/01/2013   *RADIOLOGY REPORT*  Clinical Data: Upper abdominal pain.  ABDOMEN - 1 VIEW  Comparison: CT  abdomen pelvis 07/31/2013.  Findings: There is retained oral contrast in the colon.  No small bowel dilatation.  Surgical clips in the right upper quadrant. Phleboliths are seen in the anatomic pelvis.  IMPRESSION: No acute findings.   Original Report Authenticated By: Leanna Battles, M.D.   Ct Chest W Contrast  07/31/2013   CLINICAL DATA:  Recently diagnosed distal esophageal carcinoma.  EXAM: CT CHEST, ABDOMEN, AND PELVIS WITH CONTRAST  TECHNIQUE: Multidetector CT imaging of the chest, abdomen and pelvis was performed following the standard protocol during bolus administration of intravenous contrast.  CONTRAST:  80mL OMNIPAQUE IOHEXOL 300 MG/ML  SOLN  COMPARISON:  Chest radiographs dated 07/29/2013.  FINDINGS: CT CHEST FINDINGS  Patchy prominence of the interstitial markings in both lungs, most pronounced in the upper lobes. This includes extensive ground-glass opacities. There is also diffuse peribronchial thickening. No lung masses are seen.  Mild to moderate diffuse esophageal dilatation containing fluid in the lumen. There is confluent soft tissue density involving the distal esophagus and gastroesophageal junction and adjacent proximal stomach. Evaluation of the stomach is limited by a poor distention of the stomach.  An enlarged AP window lymph node is demonstrated with a short axis diameter of 14 mm on image 17. Mildly prominent subcarinal lymph node with a short axis diameter of 11 mm on image number 26. Minimal bilateral pleural fluid. Unremarkable bones.  CT ABDOMEN AND PELVIS FINDINGS  Diffuse low density of the liver relative to the spleen. Cholecystectomy clips. Mildly enlarged adrenal glands without a discrete mass. Unremarkable spleen, pancreas, kidneys, urinary bladder, uterus and ovaries. No intestinal abnormalities. Mildly enlarged lymph nodes in the retroperitoneum and root of the  mesentery with a larger, more confluent lymph node mass with poorly defined margins partially encasing the celiac axis and proximal superior mesenteric artery. The confluent lymph node mass measures 5.4 x 2.7 cm in maximum dimensions on image 9. A left para-aortic node at the level of the mid left kidney has a short axis diameter 13 mm on image 20. A lymph node mass at the base of the mesentery, below the gastrohepatic ligament, has a short axis diameter of 12 mm on image 8. There is a gastrohepatic ligament node with a short axis diameter of 10 mm on image 4. Unremarkable lumbar spine.  IMPRESSION: CT CHEST IMPRESSION  1. Mass involving the mid distal esophagus, gastroesophageal junction and proximal stomach, compatible with the patient's known esophageal carcinoma. 2. Extensive bilateral interstitial pneumonitis. 3. Mild mediastinal adenopathy. This could be metastatic or reactive in nature.  CT ABDOMEN AND PELVIS IMPRESSION  1. Metastatic upper abdominal adenopathy, as described above. 2. Diffuse hepatic steatosis. 3. Mild bilateral adrenal hyperplasia.   Electronically Signed   By: Gordan Payment   On: 07/31/2013 17:04   Ct Abdomen Pelvis W Contrast  07/31/2013   CLINICAL DATA:  Recently diagnosed distal esophageal carcinoma.  EXAM: CT CHEST, ABDOMEN, AND PELVIS WITH CONTRAST  TECHNIQUE: Multidetector CT imaging of the chest, abdomen and pelvis was performed following the standard protocol during bolus administration of intravenous contrast.  CONTRAST:  80mL OMNIPAQUE IOHEXOL 300 MG/ML  SOLN  COMPARISON:  Chest radiographs dated 07/29/2013.  FINDINGS: CT CHEST FINDINGS  Patchy prominence of the interstitial markings in both lungs, most pronounced in the upper lobes. This includes extensive ground-glass opacities. There is also diffuse peribronchial thickening. No lung masses are seen.  Mild to moderate diffuse esophageal dilatation containing fluid in the lumen. There is confluent soft tissue density involving  the  distal esophagus and gastroesophageal junction and adjacent proximal stomach. Evaluation of the stomach is limited by a poor distention of the stomach.  An enlarged AP window lymph node is demonstrated with a short axis diameter of 14 mm on image 17. Mildly prominent subcarinal lymph node with a short axis diameter of 11 mm on image number 26. Minimal bilateral pleural fluid. Unremarkable bones.  CT ABDOMEN AND PELVIS FINDINGS  Diffuse low density of the liver relative to the spleen. Cholecystectomy clips. Mildly enlarged adrenal glands without a discrete mass. Unremarkable spleen, pancreas, kidneys, urinary bladder, uterus and ovaries. No intestinal abnormalities. Mildly enlarged lymph nodes in the retroperitoneum and root of the mesentery with a larger, more confluent lymph node mass with poorly defined margins partially encasing the celiac axis and proximal superior mesenteric artery. The confluent lymph node mass measures 5.4 x 2.7 cm in maximum dimensions on image 9. A left para-aortic node at the level of the mid left kidney has a short axis diameter 13 mm on image 20. A lymph node mass at the base of the mesentery, below the gastrohepatic ligament, has a short axis diameter of 12 mm on image 8. There is a gastrohepatic ligament node with a short axis diameter of 10 mm on image 4. Unremarkable lumbar spine.  IMPRESSION: CT CHEST IMPRESSION  1. Mass involving the mid distal esophagus, gastroesophageal junction and proximal stomach, compatible with the patient's known esophageal carcinoma. 2. Extensive bilateral interstitial pneumonitis. 3. Mild mediastinal adenopathy. This could be metastatic or reactive in nature.  CT ABDOMEN AND PELVIS IMPRESSION  1. Metastatic upper abdominal adenopathy, as described above. 2. Diffuse hepatic steatosis. 3. Mild bilateral adrenal hyperplasia.   Electronically Signed   By: Gordan Payment   On: 07/31/2013 17:04    Scheduled Meds: . azithromycin  500 mg Oral QHS  .  busPIRone  15 mg Oral BID  . cefTRIAXone (ROCEPHIN)  IV  1 g Intravenous Q24H  . docusate sodium  100 mg Oral BID  . enoxaparin (LOVENOX) injection  40 mg Subcutaneous Q24H  . escitalopram  30 mg Oral Daily  . fluticasone  2 spray Each Nare Daily  . guaiFENesin  1,200 mg Oral BID  .  HYDROmorphone (DILAUDID) injection  0.5 mg Intravenous Once  . imipramine  100 mg Oral QHS  . levothyroxine  100 mcg Oral QAC breakfast  . methadone  90 mg Oral Daily  . nicotine  14 mg Transdermal Daily  . pantoprazole  40 mg Oral Daily  . senna  1 tablet Oral Daily   Continuous Infusions:    Principal Problem:   CAP (community acquired pneumonia) Active Problems:   Opioid dependence   Graves' disease   Hypokalemia    Time spent: > 35 minutes     Esperanza Sheets  [facility] Pager [phone]. If 7PM-7AM, please contact night-coverage at [email or web address], password [facility] 08/01/2013, 11:43 AM  LOS: 3 days

## 2013-08-01 NOTE — Consult Note (Signed)
I saw the patient today.  I will provide a full note in the morning.  She really needs a PET scan to help determine extent of disease.  She may benefit from an EUS.  She must have HER-2 status done on the bx that was done.    I have no idea where the path was sent.  We really need the HER2 on this.  I suspect that she has inoperable disease based on CT scan, but PET scan is needed.  Unfortunately this can only be done as an outpatient.  I will see if Mancel Bale can see her.  My office is too far away for her and I want to make this as easy as possible for her.  Her MOM had colon ca and breast ca.  I think genetic testing may be needed here.  She is very nice!!  Cindee Lame E

## 2013-08-01 NOTE — Progress Notes (Signed)
Subjective: No acute events.  Objective: Vital signs in last 24 hours: Temp:  [98.1 F (36.7 C)-99.6 F (37.6 C)] 98.1 F (36.7 C) (09/26 0504) Pulse Rate:  [85-96] 85 (09/26 0504) Resp:  [16-18] 16 (09/26 0504) BP: (114-127)/(58-70) 117/58 mmHg (09/26 0504) SpO2:  [93 %-94 %] 94 % (09/26 0504) Last BM Date: 07/30/13  Intake/Output from previous day: 09/25 0701 - 09/26 0700 In: 720 [P.O.:720] Out: -  Intake/Output this shift:    General appearance: alert and no distress GI: tender in the upper abdomen  Lab Results:  Recent Labs  07/29/13 2051 07/30/13 0200  WBC 12.2* 9.9  HGB 13.1 12.4  HCT 36.8 34.7*  PLT 301 263   BMET  Recent Labs  07/30/13 0200 07/30/13 1742 07/31/13 1000  NA 136 138 133*  K 2.2* 3.8 3.9  CL 93* 102 99  CO2 31 25 25   GLUCOSE 121* 73 74  BUN 7 6 4*  CREATININE 0.51 0.46* 0.45*  CALCIUM 8.3* 8.1* 8.7   LFT  Recent Labs  07/30/13 0200  PROT 6.3  ALBUMIN 2.6*  AST 37  ALT 18  ALKPHOS 163*  BILITOT 0.6   PT/INR No results found for this basename: LABPROT, INR,  in the last 72 hours Hepatitis Panel No results found for this basename: HEPBSAG, HCVAB, HEPAIGM, HEPBIGM,  in the last 72 hours C-Diff No results found for this basename: CDIFFTOX,  in the last 72 hours Fecal Lactopherrin No results found for this basename: FECLLACTOFRN,  in the last 72 hours  Studies/Results: Ct Chest W Contrast  07/31/2013   CLINICAL DATA:  Recently diagnosed distal esophageal carcinoma.  EXAM: CT CHEST, ABDOMEN, AND PELVIS WITH CONTRAST  TECHNIQUE: Multidetector CT imaging of the chest, abdomen and pelvis was performed following the standard protocol during bolus administration of intravenous contrast.  CONTRAST:  80mL OMNIPAQUE IOHEXOL 300 MG/ML  SOLN  COMPARISON:  Chest radiographs dated 07/29/2013.  FINDINGS: CT CHEST FINDINGS  Patchy prominence of the interstitial markings in both lungs, most pronounced in the upper lobes. This includes  extensive ground-glass opacities. There is also diffuse peribronchial thickening. No lung masses are seen.  Mild to moderate diffuse esophageal dilatation containing fluid in the lumen. There is confluent soft tissue density involving the distal esophagus and gastroesophageal junction and adjacent proximal stomach. Evaluation of the stomach is limited by a poor distention of the stomach.  An enlarged AP window lymph node is demonstrated with a short axis diameter of 14 mm on image 17. Mildly prominent subcarinal lymph node with a short axis diameter of 11 mm on image number 26. Minimal bilateral pleural fluid. Unremarkable bones.  CT ABDOMEN AND PELVIS FINDINGS  Diffuse low density of the liver relative to the spleen. Cholecystectomy clips. Mildly enlarged adrenal glands without a discrete mass. Unremarkable spleen, pancreas, kidneys, urinary bladder, uterus and ovaries. No intestinal abnormalities. Mildly enlarged lymph nodes in the retroperitoneum and root of the mesentery with a larger, more confluent lymph node mass with poorly defined margins partially encasing the celiac axis and proximal superior mesenteric artery. The confluent lymph node mass measures 5.4 x 2.7 cm in maximum dimensions on image 9. A left para-aortic node at the level of the mid left kidney has a short axis diameter 13 mm on image 20. A lymph node mass at the base of the mesentery, below the gastrohepatic ligament, has a short axis diameter of 12 mm on image 8. There is a gastrohepatic ligament node with a short axis  diameter of 10 mm on image 4. Unremarkable lumbar spine.  IMPRESSION: CT CHEST IMPRESSION  1. Mass involving the mid distal esophagus, gastroesophageal junction and proximal stomach, compatible with the patient's known esophageal carcinoma. 2. Extensive bilateral interstitial pneumonitis. 3. Mild mediastinal adenopathy. This could be metastatic or reactive in nature.  CT ABDOMEN AND PELVIS IMPRESSION  1. Metastatic upper  abdominal adenopathy, as described above. 2. Diffuse hepatic steatosis. 3. Mild bilateral adrenal hyperplasia.   Electronically Signed   By: Gordan Payment   On: 07/31/2013 17:04   Ct Abdomen Pelvis W Contrast  07/31/2013   CLINICAL DATA:  Recently diagnosed distal esophageal carcinoma.  EXAM: CT CHEST, ABDOMEN, AND PELVIS WITH CONTRAST  TECHNIQUE: Multidetector CT imaging of the chest, abdomen and pelvis was performed following the standard protocol during bolus administration of intravenous contrast.  CONTRAST:  80mL OMNIPAQUE IOHEXOL 300 MG/ML  SOLN  COMPARISON:  Chest radiographs dated 07/29/2013.  FINDINGS: CT CHEST FINDINGS  Patchy prominence of the interstitial markings in both lungs, most pronounced in the upper lobes. This includes extensive ground-glass opacities. There is also diffuse peribronchial thickening. No lung masses are seen.  Mild to moderate diffuse esophageal dilatation containing fluid in the lumen. There is confluent soft tissue density involving the distal esophagus and gastroesophageal junction and adjacent proximal stomach. Evaluation of the stomach is limited by a poor distention of the stomach.  An enlarged AP window lymph node is demonstrated with a short axis diameter of 14 mm on image 17. Mildly prominent subcarinal lymph node with a short axis diameter of 11 mm on image number 26. Minimal bilateral pleural fluid. Unremarkable bones.  CT ABDOMEN AND PELVIS FINDINGS  Diffuse low density of the liver relative to the spleen. Cholecystectomy clips. Mildly enlarged adrenal glands without a discrete mass. Unremarkable spleen, pancreas, kidneys, urinary bladder, uterus and ovaries. No intestinal abnormalities. Mildly enlarged lymph nodes in the retroperitoneum and root of the mesentery with a larger, more confluent lymph node mass with poorly defined margins partially encasing the celiac axis and proximal superior mesenteric artery. The confluent lymph node mass measures 5.4 x 2.7 cm in  maximum dimensions on image 9. A left para-aortic node at the level of the mid left kidney has a short axis diameter 13 mm on image 20. A lymph node mass at the base of the mesentery, below the gastrohepatic ligament, has a short axis diameter of 12 mm on image 8. There is a gastrohepatic ligament node with a short axis diameter of 10 mm on image 4. Unremarkable lumbar spine.  IMPRESSION: CT CHEST IMPRESSION  1. Mass involving the mid distal esophagus, gastroesophageal junction and proximal stomach, compatible with the patient's known esophageal carcinoma. 2. Extensive bilateral interstitial pneumonitis. 3. Mild mediastinal adenopathy. This could be metastatic or reactive in nature.  CT ABDOMEN AND PELVIS IMPRESSION  1. Metastatic upper abdominal adenopathy, as described above. 2. Diffuse hepatic steatosis. 3. Mild bilateral adrenal hyperplasia.   Electronically Signed   By: Gordan Payment   On: 07/31/2013 17:04    Medications:  Scheduled: . azithromycin  500 mg Oral QHS  . busPIRone  15 mg Oral BID  . cefTRIAXone (ROCEPHIN)  IV  1 g Intravenous Q24H  . docusate sodium  100 mg Oral BID  . enoxaparin (LOVENOX) injection  40 mg Subcutaneous Q24H  . escitalopram  30 mg Oral Daily  . fluticasone  2 spray Each Nare Daily  . guaiFENesin  1,200 mg Oral BID  . imipramine  100 mg Oral QHS  . influenza vac split quadrivalent PF  0.5 mL Intramuscular Tomorrow-1000  . levothyroxine  100 mcg Oral QAC breakfast  . methadone  90 mg Oral Daily  .  morphine injection  1 mg Intravenous Once  . nicotine  14 mg Transdermal Daily  . pantoprazole  40 mg Oral Daily  . senna  1 tablet Oral Daily   Continuous:   Assessment/Plan: 1) Poorly differentiated distal esophageal adenocarcinoma.  Please see the Red Chart for the hard copy of the pathology report and the EGD report. 2) CAP.   The CT scan confirms the findings of the EGD.  It appears to be metastatic.  There is involvement of the Celiac axis and the SMA.  She  is able to tolerate full liquids, but I am concerned about any solid foods.  Her pulmonary status is still too poor to perform an EUS for staging.  Currently she is saturating at 89-90% with 3 liters of O2.  Plan: 1) Oncology consultation.  [facility] 2) Continue with antibiotics. 3) Continue with full liquids.     LOS: 3 days   Christelle Igoe D 08/01/2013, 8:36 AM

## 2013-08-02 DIAGNOSIS — E0590 Thyrotoxicosis, unspecified without thyrotoxic crisis or storm: Secondary | ICD-10-CM

## 2013-08-02 LAB — CULTURE, RESPIRATORY W GRAM STAIN

## 2013-08-02 MED ORDER — OXYCODONE HCL 5 MG PO TABS
5.0000 mg | ORAL_TABLET | Freq: Four times a day (QID) | ORAL | Status: DC | PRN
  Administered 2013-08-02 – 2013-08-04 (×4): 10 mg via ORAL
  Filled 2013-08-02 (×4): qty 2

## 2013-08-02 NOTE — Progress Notes (Signed)
[facility] PROGRESS NOTE  [name]:[id number] DOB: Dec 24, 1965 DOA: 07/29/2013 PCP: [name], MD  Assessment/Plan:  47 y.o. female with Past medical history of multiple sclerosis, irritable bowel syndrome, depression, recurrent pancreatitis, Graves' disease, chronic methadone use admitted with pneumonia, and metastatic CA  1. CAP (community acquired pneumonia); There is an hilar prominence with interstitial pattern on chest x-ray, and her examination suggests bilateral rhonchi and crackles.  -improving, still on Oxygen 3lpm'; cont IV ceftriaxone and IV azithromycin to cover the atypical organisms primary. -based on CXR some edema likely CAP related; BNP not elevated; no clinical s/s of CHF; monitor   2. Metastatic adenocarcinoma esophageal; s/p EGD;   -CT: mass in the esophagus, gastroesophageal junction and proximal stomach; Mild mediastinal adenopathy. This could be metastatic or reactive with pneumonia; Metastatic upper abdominal adenopathy -GI involved; oncology ? Outpatient PETplan; EUS/HER2; pending final recommendations   3. Abdominal pain chronic on methadone. 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 -cont methadone   3. Multiple sclerosis Does not appear to have any flares at present. Continue to monitor.   4. Hypothyroidism and Graves' disease Continue Synthroid   5. Smoking Counseling was provided to the patient, nicotine patch at present   6. Hypo K; replacing IV+PO; improved   Code Status: full  Family Communication: not present at the bedside  (indicate person spoken with, relationship, and if by phone, the number) Disposition Plan: home -d/w patient; informed that she is not medically ready to be discharged today    Consultants:  No   Procedures:  No   Antibiotics:  Cefriaxone+azithro 9/23<<< (indicate start date, and stop date if known)  HPI/Subjective: Feels better   Objective: Filed Vitals:    08/01/13 2209  BP: 120/68  Pulse: 85  Temp: 98.6 F (37 C)  Resp: 18    Intake/Output Summary (Last 24 hours) at 08/02/13 1131 Last data filed at 08/01/13 1300  Gross per 24 hour  Intake    480 ml  Output      0 ml  Net    480 ml   There were no vitals filed for this visit.  Exam:   General:  Alert, awake   Cardiovascular: S1, S2 RRR  Respiratory: LL crackles rhonchi  Abdomen: soft, NT ND   Musculoskeletal: no LE edema    Data Reviewed: Basic Metabolic Panel:  Recent Labs Lab 07/29/13 2051 07/30/13 0200 07/30/13 1742 07/31/13 1000  NA 135 136 138 133*  K 3.1* 2.2* 3.8 3.9  CL 93* 93* 102 99  CO2 30 31 25 25   GLUCOSE 90 121* 73 74  BUN 9 7 6  4*  CREATININE 0.50 0.51 0.46* 0.45*  CALCIUM 8.5 8.3* 8.1* 8.7   Liver Function Tests:  Recent Labs Lab 07/30/13 0200  AST 37  ALT 18  ALKPHOS 163*  BILITOT 0.6  PROT 6.3  ALBUMIN 2.6*    Recent Labs Lab 07/30/13 0200  LIPASE 11   No results found for this basename: AMMONIA,  in the last 168 hours CBC:  Recent Labs Lab 07/29/13 2051 07/30/13 0200  WBC 12.2* 9.9  NEUTROABS  --  7.3  HGB 13.1 12.4  HCT 36.8 34.7*  MCV 89.5 89.7  PLT 301 263   Cardiac Enzymes: No results found for this basename: CKTOTAL, CKMB, CKMBINDEX, TROPONINI,  in the last 168 hours BNP (last 3 results)  Recent Labs  07/29/13 2104  PROBNP 74.9   CBG: No results found for this basename:  GLUCAP,  in the last 168 hours  Recent Results (from the past 240 hour(s))  CULTURE, BLOOD (ROUTINE X 2)     Status: None   Collection Time    07/30/13  1:50 AM      Result Value Range Status   Specimen Description BLOOD LEFT ARM   Final   Special Requests BOTTLES DRAWN AEROBIC ONLY 4CC   Final   Culture  Setup Time     Final   Value: 07/30/2013 10:02     Performed at [facility]   Culture     Final   Value:        BLOOD CULTURE RECEIVED NO GROWTH TO DATE CULTURE WILL BE HELD FOR 5 DAYS BEFORE ISSUING A FINAL NEGATIVE  REPORT     Performed at [facility]   Report Status PENDING   Incomplete  CULTURE, BLOOD (ROUTINE X 2)     Status: None   Collection Time    07/30/13  2:00 AM      Result Value Range Status   Specimen Description BLOOD LEFT HAND   Final   Special Requests BOTTLES DRAWN AEROBIC ONLY 5CC   Final   Culture  Setup Time     Final   Value: 07/30/2013 10:02     Performed at [facility]   Culture     Final   Value:        BLOOD CULTURE RECEIVED NO GROWTH TO DATE CULTURE WILL BE HELD FOR 5 DAYS BEFORE ISSUING A FINAL NEGATIVE REPORT     Performed at [facility]   Report Status PENDING   Incomplete  CULTURE, EXPECTORATED SPUTUM-ASSESSMENT     Status: None   Collection Time    07/30/13  6:38 AM      Result Value Range Status   Specimen Description SPUTUM   Final   Special Requests NONE   Final   Sputum evaluation     Final   Value: MICROSCOPIC FINDINGS SUGGEST THAT THIS SPECIMEN IS NOT REPRESENTATIVE OF LOWER RESPIRATORY SECRETIONS. PLEASE RECOLLECT.     CALLED TO B.LABBATO,RN 07/30/13 0745 BY BSLADE   Report Status 07/30/2013 FINAL   Final  CULTURE, EXPECTORATED SPUTUM-ASSESSMENT     Status: None   Collection Time    07/30/13  9:22 PM      Result Value Range Status   Specimen Description SPUTUM   Final   Special Requests NONE   Final   Sputum evaluation     Final   Value: THIS SPECIMEN IS ACCEPTABLE. RESPIRATORY CULTURE REPORT TO FOLLOW.   Report Status 07/31/2013 FINAL   Final  CULTURE, RESPIRATORY (NON-EXPECTORATED)     Status: None   Collection Time    07/30/13  9:22 PM      Result Value Range Status   Specimen Description SPUTUM   Final   Special Requests NONE   Final   Gram Stain     Final   Value: FEW WBC PRESENT,BOTH PMN AND MONONUCLEAR     RARE SQUAMOUS EPITHELIAL CELLS PRESENT     RARE GRAM POSITIVE COCCI     IN PAIRS     Performed at [facility]   Culture     Final   Value: FEW CANDIDA ALBICANS     Performed at [facility]    Report Status PENDING   Incomplete     Studies: Dg Abd 1 View  08/01/2013   *RADIOLOGY REPORT*  Clinical Data: Upper  abdominal pain.  ABDOMEN - 1 VIEW  Comparison: CT abdomen pelvis 07/31/2013.  Findings: There is retained oral contrast in the colon.  No small bowel dilatation.  Surgical clips in the right upper quadrant. Phleboliths are seen in the anatomic pelvis.  IMPRESSION: No acute findings.   Original Report Authenticated By: Leanna Battles, M.D.   Ct Chest W Contrast  07/31/2013   CLINICAL DATA:  Recently diagnosed distal esophageal carcinoma.  EXAM: CT CHEST, ABDOMEN, AND PELVIS WITH CONTRAST  TECHNIQUE: Multidetector CT imaging of the chest, abdomen and pelvis was performed following the standard protocol during bolus administration of intravenous contrast.  CONTRAST:  80mL OMNIPAQUE IOHEXOL 300 MG/ML  SOLN  COMPARISON:  Chest radiographs dated 07/29/2013.  FINDINGS: CT CHEST FINDINGS  Patchy prominence of the interstitial markings in both lungs, most pronounced in the upper lobes. This includes extensive ground-glass opacities. There is also diffuse peribronchial thickening. No lung masses are seen.  Mild to moderate diffuse esophageal dilatation containing fluid in the lumen. There is confluent soft tissue density involving the distal esophagus and gastroesophageal junction and adjacent proximal stomach. Evaluation of the stomach is limited by a poor distention of the stomach.  An enlarged AP window lymph node is demonstrated with a short axis diameter of 14 mm on image 17. Mildly prominent subcarinal lymph node with a short axis diameter of 11 mm on image number 26. Minimal bilateral pleural fluid. Unremarkable bones.  CT ABDOMEN AND PELVIS FINDINGS  Diffuse low density of the liver relative to the spleen. Cholecystectomy clips. Mildly enlarged adrenal glands without a discrete mass. Unremarkable spleen, pancreas, kidneys, urinary bladder, uterus and ovaries. No intestinal abnormalities.  Mildly enlarged lymph nodes in the retroperitoneum and root of the mesentery with a larger, more confluent lymph node mass with poorly defined margins partially encasing the celiac axis and proximal superior mesenteric artery. The confluent lymph node mass measures 5.4 x 2.7 cm in maximum dimensions on image 9. A left para-aortic node at the level of the mid left kidney has a short axis diameter 13 mm on image 20. A lymph node mass at the base of the mesentery, below the gastrohepatic ligament, has a short axis diameter of 12 mm on image 8. There is a gastrohepatic ligament node with a short axis diameter of 10 mm on image 4. Unremarkable lumbar spine.  IMPRESSION: CT CHEST IMPRESSION  1. Mass involving the mid distal esophagus, gastroesophageal junction and proximal stomach, compatible with the patient's known esophageal carcinoma. 2. Extensive bilateral interstitial pneumonitis. 3. Mild mediastinal adenopathy. This could be metastatic or reactive in nature.  CT ABDOMEN AND PELVIS IMPRESSION  1. Metastatic upper abdominal adenopathy, as described above. 2. Diffuse hepatic steatosis. 3. Mild bilateral adrenal hyperplasia.   Electronically Signed   By: Gordan Payment   On: 07/31/2013 17:04   Ct Abdomen Pelvis W Contrast  07/31/2013   CLINICAL DATA:  Recently diagnosed distal esophageal carcinoma.  EXAM: CT CHEST, ABDOMEN, AND PELVIS WITH CONTRAST  TECHNIQUE: Multidetector CT imaging of the chest, abdomen and pelvis was performed following the standard protocol during bolus administration of intravenous contrast.  CONTRAST:  80mL OMNIPAQUE IOHEXOL 300 MG/ML  SOLN  COMPARISON:  Chest radiographs dated 07/29/2013.  FINDINGS: CT CHEST FINDINGS  Patchy prominence of the interstitial markings in both lungs, most pronounced in the upper lobes. This includes extensive ground-glass opacities. There is also diffuse peribronchial thickening. No lung masses are seen.  Mild to moderate diffuse esophageal dilatation containing  fluid in  the lumen. There is confluent soft tissue density involving the distal esophagus and gastroesophageal junction and adjacent proximal stomach. Evaluation of the stomach is limited by a poor distention of the stomach.  An enlarged AP window lymph node is demonstrated with a short axis diameter of 14 mm on image 17. Mildly prominent subcarinal lymph node with a short axis diameter of 11 mm on image number 26. Minimal bilateral pleural fluid. Unremarkable bones.  CT ABDOMEN AND PELVIS FINDINGS  Diffuse low density of the liver relative to the spleen. Cholecystectomy clips. Mildly enlarged adrenal glands without a discrete mass. Unremarkable spleen, pancreas, kidneys, urinary bladder, uterus and ovaries. No intestinal abnormalities. Mildly enlarged lymph nodes in the retroperitoneum and root of the mesentery with a larger, more confluent lymph node mass with poorly defined margins partially encasing the celiac axis and proximal superior mesenteric artery. The confluent lymph node mass measures 5.4 x 2.7 cm in maximum dimensions on image 9. A left para-aortic node at the level of the mid left kidney has a short axis diameter 13 mm on image 20. A lymph node mass at the base of the mesentery, below the gastrohepatic ligament, has a short axis diameter of 12 mm on image 8. There is a gastrohepatic ligament node with a short axis diameter of 10 mm on image 4. Unremarkable lumbar spine.  IMPRESSION: CT CHEST IMPRESSION  1. Mass involving the mid distal esophagus, gastroesophageal junction and proximal stomach, compatible with the patient's known esophageal carcinoma. 2. Extensive bilateral interstitial pneumonitis. 3. Mild mediastinal adenopathy. This could be metastatic or reactive in nature.  CT ABDOMEN AND PELVIS IMPRESSION  1. Metastatic upper abdominal adenopathy, as described above. 2. Diffuse hepatic steatosis. 3. Mild bilateral adrenal hyperplasia.   Electronically Signed   By: Gordan Payment   On: 07/31/2013  17:04    Scheduled Meds: . azithromycin  500 mg Oral QHS  . busPIRone  15 mg Oral BID  . cefTRIAXone (ROCEPHIN)  IV  1 g Intravenous Q24H  . docusate sodium  100 mg Oral BID  . enoxaparin (LOVENOX) injection  40 mg Subcutaneous Q24H  . escitalopram  30 mg Oral Daily  . fluticasone  2 spray Each Nare Daily  . guaiFENesin  1,200 mg Oral BID  . imipramine  100 mg Oral QHS  . levothyroxine  100 mcg Oral QAC breakfast  . methadone  90 mg Oral Daily  . nicotine  14 mg Transdermal Daily  . pantoprazole  40 mg Oral Daily  . senna  1 tablet Oral Daily   Continuous Infusions:    Principal Problem:   CAP (community acquired pneumonia) Active Problems:   Opioid dependence   Graves' disease   Hypokalemia    Time spent: > 35 minutes     Esperanza Sheets  [facility] Pager [phone]. If 7PM-7AM, please contact night-coverage at [email or web address], password [facility] 08/02/2013, 11:31 AM  LOS: 4 days

## 2013-08-03 DIAGNOSIS — C801 Malignant (primary) neoplasm, unspecified: Secondary | ICD-10-CM

## 2013-08-03 NOTE — Progress Notes (Signed)
[facility] PROGRESS NOTE  [name] RUE:[id number] DOB: January 14, 1966 DOA: 07/29/2013 PCP: [name], MD  Assessment/Plan:  47 y.o. female with Past medical history of multiple sclerosis, irritable bowel syndrome, depression, recurrent pancreatitis, Graves' disease, chronic methadone use admitted with pneumonia, and metastatic CA  1. CAP (community acquired pneumonia); There is an hilar prominence with interstitial pattern on chest x-ray, and her examination suggests bilateral rhonchi and crackles.  -improving, wean Oxygen'; cont IV ceftriaxone and IV azithromycin to cover the atypical organisms primary. -based on CXR some edema likely CAP related; BNP not elevated; no clinical s/s of CHF; monitor   2. Newly Dx Metastatic adenocarcinoma esophageal; s/p EGD;   -CT: mass in the esophagus, gastroesophageal junction and proximal stomach; Mild mediastinal adenopathy. This could be metastatic or reactive with pneumonia; Metastatic upper abdominal adenopathy -GI involved; oncology ? Outpatient PETplan; EUS/HER2; pending final recommendations from oncology   3. Abdominal pain chronic on methadone. 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 -cont methadone   3. Multiple sclerosis Does not appear to have any flares at present. Continue to monitor.   4. Hypothyroidism and Graves' disease Continue Synthroid   5. Smoking Counseling was provided to the patient, nicotine patch at present   6. Hypo K; replacing IV+PO; improved   Code Status: full  Family Communication: not present at the bedside  (indicate person spoken with, relationship, and if by phone, the number) Disposition Plan: home -d/w patient; likely tomorrow if cleared by consults     Consultants:  No   Procedures:  No   Antibiotics:  Cefriaxone+azithro 9/23<<< (indicate start date, and stop date if known)  HPI/Subjective: Feels better   Objective: Filed Vitals:   08/03/13 0555   BP: 114/54  Pulse: 78  Temp: 98.5 F (36.9 C)  Resp: 18    Intake/Output Summary (Last 24 hours) at 08/03/13 1027 Last data filed at 08/03/13 0555  Gross per 24 hour  Intake    480 ml  Output      0 ml  Net    480 ml   There were no vitals filed for this visit.  Exam:   General:  Alert, awake   Cardiovascular: S1, S2 RRR  Respiratory: LL crackles rhonchi  Abdomen: soft, NT ND   Musculoskeletal: no LE edema    Data Reviewed: Basic Metabolic Panel:  Recent Labs Lab 07/29/13 2051 07/30/13 0200 07/30/13 1742 07/31/13 1000  NA 135 136 138 133*  K 3.1* 2.2* 3.8 3.9  CL 93* 93* 102 99  CO2 30 31 25 25   GLUCOSE 90 121* 73 74  BUN 9 7 6  4*  CREATININE 0.50 0.51 0.46* 0.45*  CALCIUM 8.5 8.3* 8.1* 8.7   Liver Function Tests:  Recent Labs Lab 07/30/13 0200  AST 37  ALT 18  ALKPHOS 163*  BILITOT 0.6  PROT 6.3  ALBUMIN 2.6*    Recent Labs Lab 07/30/13 0200  LIPASE 11   No results found for this basename: AMMONIA,  in the last 168 hours CBC:  Recent Labs Lab 07/29/13 2051 07/30/13 0200  WBC 12.2* 9.9  NEUTROABS  --  7.3  HGB 13.1 12.4  HCT 36.8 34.7*  MCV 89.5 89.7  PLT 301 263   Cardiac Enzymes: No results found for this basename: CKTOTAL, CKMB, CKMBINDEX, TROPONINI,  in the last 168 hours BNP (last 3 results)  Recent Labs  07/29/13 2104  PROBNP 74.9   CBG: No results found for this basename: GLUCAP,  in the last 168 hours  Recent Results (from the past 240 hour(s))  CULTURE, BLOOD (ROUTINE X 2)     Status: None   Collection Time    07/30/13  1:50 AM      Result Value Range Status   Specimen Description BLOOD LEFT ARM   Final   Special Requests BOTTLES DRAWN AEROBIC ONLY 4CC   Final   Culture  Setup Time     Final   Value: 07/30/2013 10:02     Performed at [facility]   Culture     Final   Value:        BLOOD CULTURE RECEIVED NO GROWTH TO DATE CULTURE WILL BE HELD FOR 5 DAYS BEFORE ISSUING A FINAL NEGATIVE REPORT      Performed at [facility]   Report Status PENDING   Incomplete  CULTURE, BLOOD (ROUTINE X 2)     Status: None   Collection Time    07/30/13  2:00 AM      Result Value Range Status   Specimen Description BLOOD LEFT HAND   Final   Special Requests BOTTLES DRAWN AEROBIC ONLY 5CC   Final   Culture  Setup Time     Final   Value: 07/30/2013 10:02     Performed at [facility]   Culture     Final   Value:        BLOOD CULTURE RECEIVED NO GROWTH TO DATE CULTURE WILL BE HELD FOR 5 DAYS BEFORE ISSUING A FINAL NEGATIVE REPORT     Performed at [facility]   Report Status PENDING   Incomplete  CULTURE, EXPECTORATED SPUTUM-ASSESSMENT     Status: None   Collection Time    07/30/13  6:38 AM      Result Value Range Status   Specimen Description SPUTUM   Final   Special Requests NONE   Final   Sputum evaluation     Final   Value: MICROSCOPIC FINDINGS SUGGEST THAT THIS SPECIMEN IS NOT REPRESENTATIVE OF LOWER RESPIRATORY SECRETIONS. PLEASE RECOLLECT.     CALLED TO B.LABBATO,RN 07/30/13 0745 BY BSLADE   Report Status 07/30/2013 FINAL   Final  CULTURE, EXPECTORATED SPUTUM-ASSESSMENT     Status: None   Collection Time    07/30/13  9:22 PM      Result Value Range Status   Specimen Description SPUTUM   Final   Special Requests NONE   Final   Sputum evaluation     Final   Value: THIS SPECIMEN IS ACCEPTABLE. RESPIRATORY CULTURE REPORT TO FOLLOW.   Report Status 07/31/2013 FINAL   Final  CULTURE, RESPIRATORY (NON-EXPECTORATED)     Status: None   Collection Time    07/30/13  9:22 PM      Result Value Range Status   Specimen Description SPUTUM   Final   Special Requests NONE   Final   Gram Stain     Final   Value: FEW WBC PRESENT,BOTH PMN AND MONONUCLEAR     RARE SQUAMOUS EPITHELIAL CELLS PRESENT     RARE GRAM POSITIVE COCCI     IN PAIRS     Performed at [facility]   Culture     Final   Value: FEW CANDIDA ALBICANS     Performed at [facility]   Report  Status 08/02/2013 FINAL   Final     Studies: No results found.  Scheduled Meds: . azithromycin  500 mg Oral QHS  .  busPIRone  15 mg Oral BID  . cefTRIAXone (ROCEPHIN)  IV  1 g Intravenous Q24H  . docusate sodium  100 mg Oral BID  . enoxaparin (LOVENOX) injection  40 mg Subcutaneous Q24H  . escitalopram  30 mg Oral Daily  . fluticasone  2 spray Each Nare Daily  . guaiFENesin  1,200 mg Oral BID  . imipramine  100 mg Oral QHS  . levothyroxine  100 mcg Oral QAC breakfast  . methadone  90 mg Oral Daily  . nicotine  14 mg Transdermal Daily  . pantoprazole  40 mg Oral Daily  . senna  1 tablet Oral Daily   Continuous Infusions:    Principal Problem:   CAP (community acquired pneumonia) Active Problems:   Opioid dependence   Graves' disease   Hypokalemia    Time spent: > 35 minutes     Esperanza Sheets  [facility] Pager [phone]. If 7PM-7AM, please contact night-coverage at [email or web address], password [facility] 08/03/2013, 10:27 AM  LOS: 5 days

## 2013-08-03 NOTE — Progress Notes (Signed)
Pt has limited venous access available and after long term illness is beginning to clamp down  when access is attempted.  Pt. states she is now about to undergo surgery and chemotherapy treatment.  Please consider IR placing port for further access.  This would provide the safest access for her with her continued medical treatments.                                                                                              Thank you,                                                                                                                   Thompson Caul Messenheimer [facility]   IV team

## 2013-08-04 ENCOUNTER — Inpatient Hospital Stay (HOSPITAL_COMMUNITY): Payer: Medicare Other

## 2013-08-04 MED ORDER — GADOBENATE DIMEGLUMINE 529 MG/ML IV SOLN
15.0000 mL | Freq: Once | INTRAVENOUS | Status: AC
  Administered 2013-08-04: 15 mL via INTRAVENOUS

## 2013-08-04 MED ORDER — LORAZEPAM BOLUS VIA INFUSION: 0.5000 mg | Freq: Once | INTRAVENOUS | Status: DC

## 2013-08-04 MED ORDER — LORAZEPAM 2 MG/ML IJ SOLN
0.5000 mg | Freq: Once | INTRAMUSCULAR | Status: AC
  Administered 2013-08-04: 0.5 mg via INTRAVENOUS
  Filled 2013-08-04 (×2): qty 1

## 2013-08-04 MED ORDER — AMOXICILLIN-POT CLAVULANATE 875-125 MG PO TABS: 1.0000 | ORAL_TABLET | Freq: Two times a day (BID) | ORAL | Status: DC

## 2013-08-04 MED ORDER — OXYCODONE-ACETAMINOPHEN 5-325 MG PO TABS: 1.0000 | ORAL_TABLET | ORAL | Status: DC | PRN

## 2013-08-04 MED ORDER — LORAZEPAM BOLUS VIA INFUSION
0.5000 mg | Freq: Once | INTRAVENOUS | Status: AC
  Administered 2013-08-04: 0.5 mg via INTRAVENOUS

## 2013-08-04 NOTE — Progress Notes (Signed)
At 0615,[name] NT found 2 pills un-opened of methadone 10mg  po behind the keypad of computer in [address] [name].Pt is ordered 90mg  gday.Pt may have gotten only 70mg  when last dose given by Alicia Cranker RN.Reported to [name] pharmd  Who told me to inventory methadone drawer pocket,and include the 2 extra that were found,thus creating a discrepency of 68 pills instead of 66 pills.Then he instructed me to resolve the discrepency by picking last cookie cutter phrase to pick from list and type a note to explain.This was done and discrepency was resolved.All above info [name] reported to [employer] [facility].Will phone [name] to f/u and report the 2 extra methadone pills found.[name]

## 2013-08-04 NOTE — Consult Note (Signed)
#   [id number] is the consult note I dictated.  [address] 33:3-4

## 2013-08-04 NOTE — Progress Notes (Signed)
Subjective: Feeling better.  Breathing is better, but still on oxygen.  Objective: Vital signs in last 24 hours: Temp:  [98.2 F (36.8 C)-98.5 F (36.9 C)] 98.2 F (36.8 C) (09/29 0603) Pulse Rate:  [71-76] 76 (09/29 0603) Resp:  [18] 18 (09/29 0603) BP: (119-126)/(60-72) 124/72 mmHg (09/29 0603) SpO2:  [97 %-98 %] 97 % (09/29 0603) Last BM Date: 08/03/13  Intake/Output from previous day: 09/28 0701 - 09/29 0700 In: 720 [P.O.:720] Out: -  Intake/Output this shift:    General appearance: alert and no distress GI: tender in the upper abdomen  Lab Results: No results found for this basename: WBC, HGB, HCT, PLT,  in the last 72 hours BMET No results found for this basename: NA, K, CL, CO2, GLUCOSE, BUN, CREATININE, CALCIUM,  in the last 72 hours LFT No results found for this basename: PROT, ALBUMIN, AST, ALT, ALKPHOS, BILITOT, BILIDIR, IBILI,  in the last 72 hours PT/INR No results found for this basename: LABPROT, INR,  in the last 72 hours Hepatitis Panel No results found for this basename: HEPBSAG, HCVAB, HEPAIGM, HEPBIGM,  in the last 72 hours C-Diff No results found for this basename: CDIFFTOX,  in the last 72 hours Fecal Lactopherrin No results found for this basename: FECLLACTOFRN,  in the last 72 hours  Studies/Results: No results found.  Medications:  Scheduled: . busPIRone  15 mg Oral BID  . cefTRIAXone (ROCEPHIN)  IV  1 g Intravenous Q24H  . docusate sodium  100 mg Oral BID  . enoxaparin (LOVENOX) injection  40 mg Subcutaneous Q24H  . escitalopram  30 mg Oral Daily  . fluticasone  2 spray Each Nare Daily  . guaiFENesin  1,200 mg Oral BID  . imipramine  100 mg Oral QHS  . levothyroxine  100 mcg Oral QAC breakfast  . methadone  90 mg Oral Daily  . nicotine  14 mg Transdermal Daily  . pantoprazole  40 mg Oral Daily  . senna  1 tablet Oral Daily   Continuous:   Assessment/Plan: 1) Esophageal adenocarcinoma.  Path report is in the front of the Red  Chart.  The Red Chart is located in the cabinet next to the room. 2) CAP.    Her pulse ox is improving.  Currently saturating in the low to mid 90's on 2 liters of oxygen.    Plan: 1) Continue treatment for CAP. 2) I will perform an EUS once she is off of oxygen. 3) I will call [facility] to run a HER2 status on the biopsy samples.  LOS: 6 days   [name] 08/04/2013, 8:09 AM

## 2013-08-04 NOTE — Discharge Summary (Signed)
Physician Discharge Summary  [name] ZOX:[id number] DOB: 01-11-1966 DOA: 07/29/2013  PCP: [name], MD  Admit date: 07/29/2013 Discharge date: 08/04/2013  Time spent: > 35 minutes  minutes  Recommendations for Outpatient Follow-up:   F/u with GI this Wednesday to scheduled EUS on Saturday d/w GI  F/u with oncology this week for PET CT and treatment plan; d/w oncology   Discharge Diagnoses:  Principal Problem:   CAP (community acquired pneumonia) Active Problems:   Opioid dependence   Graves' disease   Hypokalemia   Discharge Condition: stable   Diet recommendation: heart healthy   There were no vitals filed for this visit.  History of present illness:  47 y.o. female with Past medical history of multiple sclerosis, irritable bowel syndrome, depression, recurrent pancreatitis, Graves' disease, chronic methadone use admitted with pneumonia, and metastatic CA   Hospital Course:  1. CAP (community acquired pneumonia); There is an hilar prominence with interstitial pattern on chest x-ray, and her examination suggests bilateral rhonchi and crackles.  -improved on IV ceftriaxone and IV azithromycin to cover the atypical organisms primary. D/c on Pop atx to 2 mote days   -based on CXR some edema likely CAP related; BNP not elevated; no clinical s/s of CHF;   2. Newly Dx Metastatic adenocarcinoma esophageal; s/p EGD outpatient pend official report ;  -CT: mass in the esophagus, gastroesophageal junction and proximal stomach; Mild mediastinal adenopathy. This could be metastatic or reactive with pneumonia; Metastatic upper abdominal adenopathy  -MRI head: NO mets  -d/w GI Dr. [name] who plan to EUS biopsy on this Saturday  -d/w oncology Dr. Myna Hidalgo who plan  PET; EUS/HER2; and treatment plan as outpatient   3. Abdominal pain chronic on methadone. 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  -cont methadone   3. Multiple sclerosis Does not appear  to have any flares at present. Continue to monitor.   4. Hypothyroidism and Graves' disease Continue Synthroid   5. Smoking Counseling was provided to the patient, nicotine patch at present   6. Hypo K; replaced; improved      Procedures:  MRI  (i.e. Studies not automatically included, echos, thoracentesis, etc; not x-rays)  Consultations:  Oncology, GI   Discharge Exam: Filed Vitals:   08/04/13 0603  BP: 124/72  Pulse: 76  Temp: 98.2 F (36.8 C)  Resp: 18    General: alert  Cardiovascular:s1,s2 rrr  Respiratory: CTA BL   Discharge Instructions  Discharge Orders   Future Orders Complete By Expires   Diet - low sodium heart healthy  As directed    Discharge instructions  As directed    Comments:     Please follow up with gastroenterologist on Wednesday this week  Please follow up with oncologist next week   Increase activity slowly  As directed        Medication List    STOP taking these medications       amoxicillin 875 MG tablet  Commonly known as:  AMOXIL     azithromycin 250 MG tablet  Commonly known as:  ZITHROMAX Z-PAK     diclofenac 75 MG EC tablet  Commonly known as:  VOLTAREN      TAKE these medications       albuterol 108 (90 BASE) MCG/ACT inhaler  Commonly known as:  PROVENTIL HFA;VENTOLIN HFA  Inhale 2 puffs into the lungs every 4 (four) hours as needed for wheezing or shortness of breath.     amoxicillin-clavulanate 875-125 MG  per tablet  Commonly known as:  AUGMENTIN  Take 1 tablet by mouth 2 (two) times daily.     busPIRone 15 MG tablet  Commonly known as:  BUSPAR  Take 15 mg by mouth 2 (two) times daily.     cyclobenzaprine 10 MG tablet  Commonly known as:  FLEXERIL  Take 10 mg by mouth 3 (three) times daily as needed. For muscle spasms.     diclofenac sodium 1 % Gel  Commonly known as:  VOLTAREN  Apply 1 application topically 2 (two) times daily. Knees, hips, neck     escitalopram 10 MG tablet  Commonly known as:   LEXAPRO  Take 30 mg by mouth daily.     esomeprazole 20 MG capsule  Commonly known as:  NEXIUM  Take 20 mg by mouth daily before breakfast.     imipramine 50 MG tablet  Commonly known as:  TOFRANIL  Take 100 mg by mouth at bedtime.     levothyroxine 100 MCG tablet  Commonly known as:  SYNTHROID, LEVOTHROID  Take 100 mcg by mouth daily.     Melatonin 3 MG Tabs  Take 1 tablet by mouth at bedtime as needed. For sleep     methadone 10 MG/5ML solution  Commonly known as:  DOLOPHINE  Take 91 mg by mouth daily.     multivitamin with minerals Tabs tablet  Take 1 tablet by mouth daily.     potassium chloride 10 MEQ tablet  Commonly known as:  K-DUR,KLOR-CON  Take 10 mEq by mouth daily.       Allergies  Allergen Reactions  . Erythromycin Nausea And Vomiting  . Morphine And Related Other (See Comments)    whelps on arms at iv site       Follow-up Information   Follow up with HUNG,PATRICK D, MD In 2 days.   Specialty:  Gastroenterology   Contact information:   [address] [place] [address] [phone]       Follow up with Josph Macho, MD In 1 week.   Specialty:  Oncology   Contact information:   [address] [place] [address] [phone]        The results of significant diagnostics from this hospitalization (including imaging, microbiology, ancillary and laboratory) are listed below for reference.    Significant Diagnostic Studies: Dg Chest 2 View  07/29/2013   CLINICAL DATA:  Shortness of breath. Chest pain with inspiration. Recently diagnosed with pneumonia. History of multiple sclerosis, irritable bowel syndrome, pancreatitis and Graves disease.  EXAM: CHEST  2 VIEW  COMPARISON:  07/28/2013 and 08/28/2012.  FINDINGS: The bilateral perihilar airspace opacities have not significantly changed from yesterday's examination. There is no significant pleural effusion. The heart size and mediastinal contours are stable with  persistent fullness in the AP window which may reflect lymphadenopathy. No acute osseous findings are seen. Cholecystectomy clips are again noted.  IMPRESSION: No significant change in perihilar airspace opacities and suspected AP window adenopathy compared with yesterday's examination. findings remain most consistent with pneumonia, although could reflect atypical edema or noninfectious inflammation. Continued radiographic follow up recommended.   Electronically Signed   By: Roxy Horseman   On: 07/29/2013 20:46   Dg Chest 2 View  07/28/2013   CLINICAL DATA:  Cough for 3 days. Cigarette smoker.  EXAM: CHEST  2 VIEW  COMPARISON:  08/28/2012.  FINDINGS: Cardiopericardial silhouette within normal limits for projection. There is bilateral perihilar airspace disease extending into both  lower lobes. More focal airspace disease extends to the right upper lobe. There is no effusion. Cholecystectomy clips are present in the right upper quadrant. Trachea midline. Mediastinal contours demonstrate prominence in the AP window which may represent adenopathy.  Followup to ensure radiographic clearing and exclude an underlying lesion is recommended. Typically clearing will be observed at 8 weeks.  IMPRESSION: Diffuse perihilar airspace disease and more focal right upper lobe airspace disease. This is favored to represent multifocal infection although CHF with asymmetric/atypical pulmonary edema could produce a similar appearance.   Electronically Signed   By: Andreas Newport M.D.   On: 07/28/2013 20:39   Dg Abd 1 View  08/01/2013   *RADIOLOGY REPORT*  Clinical Data: Upper abdominal pain.  ABDOMEN - 1 VIEW  Comparison: CT abdomen pelvis 07/31/2013.  Findings: There is retained oral contrast in the colon.  No small bowel dilatation.  Surgical clips in the right upper quadrant. Phleboliths are seen in the anatomic pelvis.  IMPRESSION: No acute findings.   Original Report Authenticated By: Leanna Battles, M.D.   Ct Chest W  Contrast  07/31/2013   CLINICAL DATA:  Recently diagnosed distal esophageal carcinoma.  EXAM: CT CHEST, ABDOMEN, AND PELVIS WITH CONTRAST  TECHNIQUE: Multidetector CT imaging of the chest, abdomen and pelvis was performed following the standard protocol during bolus administration of intravenous contrast.  CONTRAST:  80mL OMNIPAQUE IOHEXOL 300 MG/ML  SOLN  COMPARISON:  Chest radiographs dated 07/29/2013.  FINDINGS: CT CHEST FINDINGS  Patchy prominence of the interstitial markings in both lungs, most pronounced in the upper lobes. This includes extensive ground-glass opacities. There is also diffuse peribronchial thickening. No lung masses are seen.  Mild to moderate diffuse esophageal dilatation containing fluid in the lumen. There is confluent soft tissue density involving the distal esophagus and gastroesophageal junction and adjacent proximal stomach. Evaluation of the stomach is limited by a poor distention of the stomach.  An enlarged AP window lymph node is demonstrated with a short axis diameter of 14 mm on image 17. Mildly prominent subcarinal lymph node with a short axis diameter of 11 mm on image number 26. Minimal bilateral pleural fluid. Unremarkable bones.  CT ABDOMEN AND PELVIS FINDINGS  Diffuse low density of the liver relative to the spleen. Cholecystectomy clips. Mildly enlarged adrenal glands without a discrete mass. Unremarkable spleen, pancreas, kidneys, urinary bladder, uterus and ovaries. No intestinal abnormalities. Mildly enlarged lymph nodes in the retroperitoneum and root of the mesentery with a larger, more confluent lymph node mass with poorly defined margins partially encasing the celiac axis and proximal superior mesenteric artery. The confluent lymph node mass measures 5.4 x 2.7 cm in maximum dimensions on image 9. A left para-aortic node at the level of the mid left kidney has a short axis diameter 13 mm on image 20. A lymph node mass at the base of the mesentery, below the  gastrohepatic ligament, has a short axis diameter of 12 mm on image 8. There is a gastrohepatic ligament node with a short axis diameter of 10 mm on image 4. Unremarkable lumbar spine.  IMPRESSION: CT CHEST IMPRESSION  1. Mass involving the mid distal esophagus, gastroesophageal junction and proximal stomach, compatible with the patient's known esophageal carcinoma. 2. Extensive bilateral interstitial pneumonitis. 3. Mild mediastinal adenopathy. This could be metastatic or reactive in nature.  CT ABDOMEN AND PELVIS IMPRESSION  1. Metastatic upper abdominal adenopathy, as described above. 2. Diffuse hepatic steatosis. 3. Mild bilateral adrenal hyperplasia.   Electronically Signed  By: Gordan Payment   On: 07/31/2013 17:04   Mr Brain W Wo Contrast  08/04/2013   CLINICAL DATA:  Esophageal cancer  EXAM: MRI HEAD WITHOUT AND WITH CONTRAST  TECHNIQUE: Multiplanar, multiecho pulse sequences of the brain and surrounding structures were obtained according to standard protocol without and with intravenous contrast  CONTRAST:  15mL MULTIHANCE GADOBENATE DIMEGLUMINE 529 MG/ML IV SOLN  COMPARISON:  MRI of the brain 07/20/2005.  FINDINGS: The study is again moderately degraded by patient motion. No acute infarct, hemorrhage, or mass lesion is present. The ventricles are of normal size. No significant extra-axial fluid collection is present.  The postcontrast images demonstrate no pathologic enhancement to suggest metastatic disease of the brain or meninges. Midline structures are within normal limits.  Flow is present in the major intracranial arteries. The globes and orbits are intact. The paranasal sinuses are clear. There is some fluid in the mastoid air cells, left greater than right.  IMPRESSION: 1. The study is moderately degraded by patient motion, limiting sensitivity of very small lesions. 2. Accounting for this, the MRI appearance of the brain is normal without evidence for metastatic disease. 3. Minimal mastoid fluid  bilaterally, left greater than right. No obstructing nasopharyngeal lesion is evident.   Electronically Signed   By: Gennette Pac   On: 08/04/2013 13:16   Ct Abdomen Pelvis W Contrast  07/31/2013   CLINICAL DATA:  Recently diagnosed distal esophageal carcinoma.  EXAM: CT CHEST, ABDOMEN, AND PELVIS WITH CONTRAST  TECHNIQUE: Multidetector CT imaging of the chest, abdomen and pelvis was performed following the standard protocol during bolus administration of intravenous contrast.  CONTRAST:  80mL OMNIPAQUE IOHEXOL 300 MG/ML  SOLN  COMPARISON:  Chest radiographs dated 07/29/2013.  FINDINGS: CT CHEST FINDINGS  Patchy prominence of the interstitial markings in both lungs, most pronounced in the upper lobes. This includes extensive ground-glass opacities. There is also diffuse peribronchial thickening. No lung masses are seen.  Mild to moderate diffuse esophageal dilatation containing fluid in the lumen. There is confluent soft tissue density involving the distal esophagus and gastroesophageal junction and adjacent proximal stomach. Evaluation of the stomach is limited by a poor distention of the stomach.  An enlarged AP window lymph node is demonstrated with a short axis diameter of 14 mm on image 17. Mildly prominent subcarinal lymph node with a short axis diameter of 11 mm on image number 26. Minimal bilateral pleural fluid. Unremarkable bones.  CT ABDOMEN AND PELVIS FINDINGS  Diffuse low density of the liver relative to the spleen. Cholecystectomy clips. Mildly enlarged adrenal glands without a discrete mass. Unremarkable spleen, pancreas, kidneys, urinary bladder, uterus and ovaries. No intestinal abnormalities. Mildly enlarged lymph nodes in the retroperitoneum and root of the mesentery with a larger, more confluent lymph node mass with poorly defined margins partially encasing the celiac axis and proximal superior mesenteric artery. The confluent lymph node mass measures 5.4 x 2.7 cm in maximum dimensions on  image 9. A left para-aortic node at the level of the mid left kidney has a short axis diameter 13 mm on image 20. A lymph node mass at the base of the mesentery, below the gastrohepatic ligament, has a short axis diameter of 12 mm on image 8. There is a gastrohepatic ligament node with a short axis diameter of 10 mm on image 4. Unremarkable lumbar spine.  IMPRESSION: CT CHEST IMPRESSION  1. Mass involving the mid distal esophagus, gastroesophageal junction and proximal stomach, compatible with the patient's known esophageal  carcinoma. 2. Extensive bilateral interstitial pneumonitis. 3. Mild mediastinal adenopathy. This could be metastatic or reactive in nature.  CT ABDOMEN AND PELVIS IMPRESSION  1. Metastatic upper abdominal adenopathy, as described above. 2. Diffuse hepatic steatosis. 3. Mild bilateral adrenal hyperplasia.   Electronically Signed   By: Gordan Payment   On: 07/31/2013 17:04    Microbiology: Recent Results (from the past 240 hour(s))  CULTURE, BLOOD (ROUTINE X 2)     Status: None   Collection Time    07/30/13  1:50 AM      Result Value Range Status   Specimen Description BLOOD LEFT ARM   Final   Special Requests BOTTLES DRAWN AEROBIC ONLY 4CC   Final   Culture  Setup Time     Final   Value: 07/30/2013 10:02     Performed at [facility]   Culture     Final   Value:        BLOOD CULTURE RECEIVED NO GROWTH TO DATE CULTURE WILL BE HELD FOR 5 DAYS BEFORE ISSUING A FINAL NEGATIVE REPORT     Performed at [facility]   Report Status PENDING   Incomplete  CULTURE, BLOOD (ROUTINE X 2)     Status: None   Collection Time    07/30/13  2:00 AM      Result Value Range Status   Specimen Description BLOOD LEFT HAND   Final   Special Requests BOTTLES DRAWN AEROBIC ONLY 5CC   Final   Culture  Setup Time     Final   Value: 07/30/2013 10:02     Performed at [facility]   Culture     Final   Value:        BLOOD CULTURE RECEIVED NO GROWTH TO DATE CULTURE WILL BE HELD FOR  5 DAYS BEFORE ISSUING A FINAL NEGATIVE REPORT     Performed at [facility]   Report Status PENDING   Incomplete  CULTURE, EXPECTORATED SPUTUM-ASSESSMENT     Status: None   Collection Time    07/30/13  6:38 AM      Result Value Range Status   Specimen Description SPUTUM   Final   Special Requests NONE   Final   Sputum evaluation     Final   Value: MICROSCOPIC FINDINGS SUGGEST THAT THIS SPECIMEN IS NOT REPRESENTATIVE OF LOWER RESPIRATORY SECRETIONS. PLEASE RECOLLECT.     CALLED TO B.LABBATO,RN 07/30/13 0745 BY BSLADE   Report Status 07/30/2013 FINAL   Final  CULTURE, EXPECTORATED SPUTUM-ASSESSMENT     Status: None   Collection Time    07/30/13  9:22 PM      Result Value Range Status   Specimen Description SPUTUM   Final   Special Requests NONE   Final   Sputum evaluation     Final   Value: THIS SPECIMEN IS ACCEPTABLE. RESPIRATORY CULTURE REPORT TO FOLLOW.   Report Status 07/31/2013 FINAL   Final  CULTURE, RESPIRATORY (NON-EXPECTORATED)     Status: None   Collection Time    07/30/13  9:22 PM      Result Value Range Status   Specimen Description SPUTUM   Final   Special Requests NONE   Final   Gram Stain     Final   Value: FEW WBC PRESENT,BOTH PMN AND MONONUCLEAR     RARE SQUAMOUS EPITHELIAL CELLS PRESENT     RARE GRAM POSITIVE COCCI     IN PAIRS     Performed at [facility]   Culture     Final   Value: FEW CANDIDA ALBICANS     Performed at [facility]   Report Status 08/02/2013 FINAL   Final     Labs: Basic Metabolic Panel:  Recent Labs Lab 07/29/13 2051 07/30/13 0200 07/30/13 1742 07/31/13 1000  NA 135 136 138 133*  K 3.1* 2.2* 3.8 3.9  CL 93* 93* 102 99  CO2 30 31 25 25   GLUCOSE 90 121* 73 74  BUN 9 7 6  4*  CREATININE 0.50 0.51 0.46* 0.45*  CALCIUM 8.5 8.3* 8.1* 8.7   Liver Function Tests:  Recent Labs Lab 07/30/13 0200  AST 37  ALT 18  ALKPHOS 163*  BILITOT 0.6  PROT 6.3  ALBUMIN 2.6*    Recent Labs Lab 07/30/13 0200   LIPASE 11   No results found for this basename: AMMONIA,  in the last 168 hours CBC:  Recent Labs Lab 07/29/13 2051 07/30/13 0200  WBC 12.2* 9.9  NEUTROABS  --  7.3  HGB 13.1 12.4  HCT 36.8 34.7*  MCV 89.5 89.7  PLT 301 263   Cardiac Enzymes: No results found for this basename: CKTOTAL, CKMB, CKMBINDEX, TROPONINI,  in the last 168 hours BNP: BNP (last 3 results)  Recent Labs  07/29/13 2104  PROBNP 74.9   CBG: No results found for this basename: GLUCAP,  in the last 168 hours     Signed:  Jonette Mate N  [facility] 08/04/2013, 1:51 PM

## 2013-08-05 LAB — CULTURE, BLOOD (ROUTINE X 2): Culture: NO GROWTH

## 2013-08-05 MED FILL — Lorazepam Inj 2 MG/ML: INTRAMUSCULAR | Qty: 1 | Status: AC

## 2013-08-05 NOTE — Consult Note (Signed)
NAME:  [name], [name]                  ACCOUNT NO.:  [id number]  MEDICAL RECORD NO.:  [id number]  LOCATION:  5N31C                        FACILITY:  [facility]  PHYSICIAN:  Wanna Gully R Fatih Stalvey, M.D.  DATE OF BIRTH:  01/30/1966  DATE OF CONSULTATION: DATE OF DISCHARGE:  08/04/2013                                CONSULTATION   REFERRING PHYSICIAN:  Patrick D. Hung, MD.  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  HISTORY OF PRESENT ILLNESS:  [name] is a nice 47-year-old white female.  She was a former nurse.  She has lost some weight.  She had little bit difficulty swallowing. This may go on for a few months.  The patient subsequently was admitted because of some cough and shortness of breath.  She appears to have undergone an upper GI a week prior to admission.  I guess she saw Dr. Hung.  The EGD showed a stenosed area in distal esophagus that was friable and exhibited abnormal mucosa.  The gastric lumen was also normal.  Biopsies were taken.  The pathologist spoke with Dr. Hung.  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  Upon admission, she had lab work done.  The lab work showed a white cell count of 12.2, hemoglobin 13.1, hematocrit 36.8, platelet count 301. Potassium was 3.1.  Albumin 2.8.  Total protein 7.3.  Alkaline phosphatase is 197.  On a chest x-ray, she had bilateral perihilar airspace opacities that have not changed.  Again, she was admitted, she did have a CT scan of the chest, abdomen, and pelvis.  This is done on the 25th.  The CT of the chest showed mild- to-moderate diffuse esophageal dilation.  There is enlarged AP window lymph nodes.  There is some mildly prominent subcarinal lymph nodes. She did have a mass involving the mid distal esophagus.  This is also involving GE junction, possible stomach.  There was mild mediastinal adenopathy.  She had bilateral interstitial  pneumonitis.  There is upper abdominal adenopathy.  She has some mild hepatic steatosis.  We were asked to see her to help with management issues. Unfortunately, we cannot do a PET scan on her as she is an inpatient.  She had no headache.  There is no nausea or vomiting.  She has had a little bit of a cough.  She has had no bleeding.  She has had no change in bowel or bladder habits.  PAST MEDICAL HISTORY: 1. Irritable bowel syndrome. 2. Graves disease. 3. Posttraumatic stress disorder. 4. History of pancreatitis. 5. Arthritis. 6. Anxiety.  ALLERGIES:  Erythromycin and morphine.  MEDICATIONS:  Albuterol inhaler, azithromycin, buspirone, Flexeril, Voltaren, Lexapro, Nexium, Tofranil, Synthroid, methadone, and potassium.  SOCIAL HISTORY:  Remarkable for tobacco use.  She denies any alcohol use or recreational drug use.  She is not working.  FAMILY HISTORY:  Remarkable for her mother that you had a ovarian and colon cancer.  There is hypertension in the family.  REVIEW OF SYSTEMS:  As stated in the history of present illness.  No additional findings noted on 12-system review.  PHYSICAL EXAMINATION:  GENERAL:  This is a fairly well-developed,   well- nourished white female, in no obvious distress. VITAL SIGNS:  Showed temperature 98.2, pulse 71, respiratory rate 18, blood pressure 119/60. HEAD AND NECK:  Normocephalic, atraumatic skull.  There are no ocular or oral lesions.  There is no palpable cervical or supraclavicular lymph nodes. LUNGS:  Clear bilaterally. CARDIAC:  Regular rate and rhythm with normal S1 and S2.  There are no murmurs, rubs, or bruits. ABDOMEN:  Soft.  She has good bowel sounds.  There is no fluid wave. There is no guarding or rebound tenderness.  There is no palpable hepatosplenomegaly. BACK:  No tenderness of the spine, ribs, or hips. EXTREMITIES:  Show no clubbing, cyanosis, or edema. NEUROLOGICAL:  Shows no focal neurological deficits.  LABORATORY  STUDIES:  White cell count 9.9, hemoglobin 12.4, hematocrit 34.7, platelet count 263.  Sodium 133, potassium 3.9, BUN 4, creatinine 0.45.  Calcium 8.7.  IMPRESSION:  [name] is a 47-year-old white female with locally advanced/metastatic adenocarcinoma of the esophagus whether this is esophageal or GE junction, I think its not that important at this point.  We will have to try to see the path report.  If this is truly adenocarcinoma, she will definitely need to have a HER2 study done.  I do think that a PET scan is valuable here.  This can help us identify whether or not this is metastatic or not.  I think an endoscopic ultrasound also would be helpful.  Again, staging is the key in this situation.  Staging will determine whether or not this is that tumor that could be resected at some point.  There is the possibility for resection, then I would definitely favor neoadjuvant chemo and radiation therapy.  She is young, she has a fairly decent performance status.  I think she would be able to tolerate therapy fairly well.  Again, I do not see that we can do anything else with her as an inpatient.  I would probably would get an MRI of the brain just to make sure that there is no metastatic disease.  We will certainly follow along, I will see if Dr. Sherrill can follow her as an outpatient.  She has transportation difficulties.  She lives close to the [facility], which makes things lot easier for her.  She is awful nice.  I had a nice time talking with her.     Romilda Proby R Jonel Sick, M.D.     PRE/MEDQ  D:  08/04/2013  T:  08/05/2013  Job:  [id number] 

## 2013-08-06 ENCOUNTER — Telehealth: Payer: Self-pay | Admitting: Oncology

## 2013-08-06 ENCOUNTER — Other Ambulatory Visit: Payer: Self-pay | Admitting: Gastroenterology

## 2013-08-06 NOTE — Telephone Encounter (Signed)
LVOM FOR PT TO RETURN CALL IN RE TO APPT.

## 2013-08-06 NOTE — Telephone Encounter (Signed)
PT SCHEDULED TO SEE DR. Darrold Span 10/08 @ 3.  WELCOME PACKET MAILED.

## 2013-08-08 ENCOUNTER — Encounter (HOSPITAL_COMMUNITY)
Admission: RE | Disposition: A | Discharge status: Home / Self Care | Payer: Self-pay | Source: Ambulatory Visit | Attending: Gastroenterology

## 2013-08-08 ENCOUNTER — Other Ambulatory Visit: Payer: Self-pay | Admitting: Oncology

## 2013-08-08 ENCOUNTER — Ambulatory Visit (HOSPITAL_COMMUNITY)
Admission: RE | Admit: 2013-08-08 | Discharge: 2013-08-08 | Disposition: A | Discharge status: Home / Self Care | Payer: Medicare Other | Source: Ambulatory Visit | Attending: Gastroenterology | Admitting: Gastroenterology

## 2013-08-08 ENCOUNTER — Encounter (HOSPITAL_COMMUNITY): Payer: Self-pay | Admitting: *Deleted

## 2013-08-08 DIAGNOSIS — M129 Arthropathy, unspecified: Secondary | ICD-10-CM | POA: Insufficient documentation

## 2013-08-08 DIAGNOSIS — R05 Cough: Secondary | ICD-10-CM | POA: Insufficient documentation

## 2013-08-08 DIAGNOSIS — C159 Malignant neoplasm of esophagus, unspecified: Secondary | ICD-10-CM

## 2013-08-08 DIAGNOSIS — K589 Irritable bowel syndrome without diarrhea: Secondary | ICD-10-CM | POA: Insufficient documentation

## 2013-08-08 DIAGNOSIS — F4310 Post-traumatic stress disorder, unspecified: Secondary | ICD-10-CM | POA: Insufficient documentation

## 2013-08-08 DIAGNOSIS — R059 Cough, unspecified: Secondary | ICD-10-CM | POA: Insufficient documentation

## 2013-08-08 DIAGNOSIS — R0602 Shortness of breath: Secondary | ICD-10-CM | POA: Insufficient documentation

## 2013-08-08 DIAGNOSIS — E0500 Thyrotoxicosis with diffuse goiter without thyrotoxic crisis or storm: Secondary | ICD-10-CM | POA: Insufficient documentation

## 2013-08-08 DIAGNOSIS — J8410 Pulmonary fibrosis, unspecified: Secondary | ICD-10-CM | POA: Insufficient documentation

## 2013-08-08 DIAGNOSIS — F411 Generalized anxiety disorder: Secondary | ICD-10-CM | POA: Insufficient documentation

## 2013-08-08 DIAGNOSIS — K7689 Other specified diseases of liver: Secondary | ICD-10-CM | POA: Insufficient documentation

## 2013-08-08 DIAGNOSIS — R1310 Dysphagia, unspecified: Secondary | ICD-10-CM | POA: Insufficient documentation

## 2013-08-08 DIAGNOSIS — R599 Enlarged lymph nodes, unspecified: Secondary | ICD-10-CM | POA: Insufficient documentation

## 2013-08-08 HISTORY — DX: Other specified postprocedural states: R11.2

## 2013-08-08 HISTORY — PX: EUS: SHX5427

## 2013-08-08 HISTORY — DX: Other specified postprocedural states: Z98.890

## 2013-08-08 SURGERY — ESOPHAGEAL ENDOSCOPIC ULTRASOUND (EUS) RADIAL
Anesthesia: Moderate Sedation

## 2013-08-08 MED ORDER — SODIUM CHLORIDE 0.9 % IV SOLN
INTRAVENOUS | Status: DC
  Administered 2013-08-08: 500 mL via INTRAVENOUS

## 2013-08-08 MED ORDER — FENTANYL CITRATE 0.05 MG/ML IJ SOLN
INTRAMUSCULAR | Status: DC | PRN
  Administered 2013-08-08 (×4): 25 ug via INTRAVENOUS

## 2013-08-08 MED ORDER — BUTAMBEN-TETRACAINE-BENZOCAINE 2-2-14 % EX AERO
INHALATION_SPRAY | CUTANEOUS | Status: DC | PRN
  Administered 2013-08-08: 2 via TOPICAL

## 2013-08-08 MED ORDER — MIDAZOLAM HCL 10 MG/2ML IJ SOLN
INTRAMUSCULAR | Status: AC
  Filled 2013-08-08: qty 4

## 2013-08-08 MED ORDER — MIDAZOLAM HCL 10 MG/2ML IJ SOLN
INTRAMUSCULAR | Status: DC | PRN
  Administered 2013-08-08 (×5): 2 mg via INTRAVENOUS

## 2013-08-08 MED ORDER — DIPHENHYDRAMINE HCL 50 MG/ML IJ SOLN
INTRAMUSCULAR | Status: AC
  Filled 2013-08-08: qty 1

## 2013-08-08 MED ORDER — DIPHENHYDRAMINE HCL 50 MG/ML IJ SOLN
INTRAMUSCULAR | Status: DC | PRN
  Administered 2013-08-08 (×2): 25 mg via INTRAVENOUS

## 2013-08-08 MED ORDER — FENTANYL CITRATE 0.05 MG/ML IJ SOLN
INTRAMUSCULAR | Status: AC
  Filled 2013-08-08: qty 4

## 2013-08-08 NOTE — Op Note (Signed)
[facility], [place] [address] [place], [address]   ENDOSCOPIC ULTRASOUND PROCEDURE REPORT  PATIENT: [name], [name]  MR#: [id number] BIRTHDATE: 1966/04/02  GENDER: Female ENDOSCOPIST: Jeani Hawking, MD REFERRED BY: PROCEDURE DATE:  08/08/2013 PROCEDURE:   Upper EUS ASA CLASS:      Class III INDICATIONS:   1.  Esophageal cancer. MEDICATIONS: Versed 10 mg IV, Fentanyl 100 mcg IV, and Benadryl 25 mg IV DESCRIPTION OF PROCEDURE:   After the risks benefits and alternatives of the procedure were  explained, informed consent was obtained. The patient was then placed in the left, lateral, decubitus postion and IV sedation was administered. Throughout the procedure, the patients blood pressure, pulse and oxygen saturations were monitored continuously.  Under direct visualization, the Pentax Linear [id number]  endoscope was introduced through the mouth  and advanced to the second portion of the duodenum .  Water was used as necessary to provide an acoustic interface.  Upon completion of the imaging, water was removed and the patient was sent to the recovery room in satisfactory condition.   FINDINGS: In the distal esophagus starting down at 40 cm and extending 5 cm into the proximal stomach a stenosing and friable mass was identified.  The adult endoscope was able to pass through with moderate difficulty.  The area was balloon dilated up to 13.5 mm.  At 12 mm, the balloon moved freely.  After dilation the radial echoendoscope was inserted.  The maximal depth of tumor invasion was 28 mm and it extended beyond the adventitia.  Two large peritumoral lymph nodes were found measuring 10 mm.  I attempted to evaluate the Celiac axis, but the anatomy was distored and at this time the patient became very restless.  Increasing the sedatives did not alleviate the problem.  At this point I made the decision to conclude the procedure.   The scope was then withdrawn from the patient and  the procedure completed.  COMPLICATIONS: There were no complications.    ENDOSCOPIC IMPRESSION: 1) T4N2 distal esohpageal/proximal gastric tumor. (CT scan reveals that the tumor encases the Celiac axis.)  RECOMMENDATIONS: 1) Chemotherapy and XRT per Oncology.  _______________________________ eSignedJeani Hawking, MD 08/08/2013 3:52 PM   CC:

## 2013-08-08 NOTE — Interval H&P Note (Signed)
History and Physical Interval Note:  08/08/2013 2:29 PM  [name]  has presented today for surgery, with the diagnosis of esophageal cancer  The various methods of treatment have been discussed with the patient and family. After consideration of risks, benefits and other options for treatment, the patient has consented to  Procedure(s): ESOPHAGEAL ENDOSCOPIC ULTRASOUND (EUS) RADIAL (N/A) as a surgical intervention .  The patient's history has been reviewed, patient examined, no change in status, stable for surgery.  I have reviewed the patient's chart and labs.  Questions were answered to the patient's satisfaction.     Hanford Lust D

## 2013-08-08 NOTE — H&P (View-Only) (Signed)
NAMEELIZET, [name] NO.:  [id number]  MEDICAL RECORD NO.:  [id number]  LOCATION:  5N31C                        FACILITY:  [facility]  PHYSICIAN:  Josph Macho, M.D.  DATE OF BIRTH:  [birth year]  DATE OF CONSULTATION: DATE OF DISCHARGE:  08/04/2013                                CONSULTATION   REFERRING PHYSICIAN:  [name]. [name], MD.  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  HISTORY OF PRESENT ILLNESS:  [name] is a nice 47 year old white female.  She was a former Engineer, civil (consulting).  She has lost some weight.  She had little bit difficulty swallowing. This may go on for a few months.  The patient subsequently was admitted because of some cough and shortness of breath.  She appears to have undergone an upper GI a week prior to admission.  I guess she saw Dr. [name].  The EGD showed a stenosed area in distal esophagus that was friable and exhibited abnormal mucosa.  The gastric lumen was also normal.  Biopsies were taken.  The pathologist spoke with Dr. [name].  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  Upon admission, she had lab work done.  The lab work showed a white cell count of 12.2, hemoglobin 13.1, hematocrit 36.8, platelet count 301. Potassium was 3.1.  Albumin 2.8.  Total protein 7.3.  Alkaline phosphatase is 197.  On a chest x-ray, she had bilateral perihilar airspace opacities that have not changed.  Again, she was admitted, she did have a CT scan of the chest, abdomen, and pelvis.  This is done on the 25th.  The CT of the chest showed mild- to-moderate diffuse esophageal dilation.  There is enlarged AP window lymph nodes.  There is some mildly prominent subcarinal lymph nodes. She did have a mass involving the mid distal esophagus.  This is also involving GE junction, possible stomach.  There was mild mediastinal adenopathy.  She had bilateral interstitial  pneumonitis.  There is upper abdominal adenopathy.  She has some mild hepatic steatosis.  We were asked to see her to help with management issues. Unfortunately, we cannot do a PET scan on her as she is an inpatient.  She had no headache.  There is no nausea or vomiting.  She has had a little bit of a cough.  She has had no bleeding.  She has had no change in bowel or bladder habits.  PAST MEDICAL HISTORY: 1. Irritable bowel syndrome. 2. Graves disease. 3. Posttraumatic stress disorder. 4. History of pancreatitis. 5. Arthritis. 6. Anxiety.  ALLERGIES:  Erythromycin and morphine.  MEDICATIONS:  Albuterol inhaler, azithromycin, buspirone, Flexeril, Voltaren, Lexapro, Nexium, Tofranil, Synthroid, methadone, and potassium.  SOCIAL HISTORY:  Remarkable for tobacco use.  She denies any alcohol use or recreational drug use.  She is not working.  FAMILY HISTORY:  Remarkable for her mother that you had a ovarian and colon cancer.  There is hypertension in the family.  REVIEW OF SYSTEMS:  As stated in the history of present illness.  No additional findings noted on 12-system review.  PHYSICAL EXAMINATION:  GENERAL:  This is a fairly well-developed,  well- nourished white female, in no obvious distress. VITAL SIGNS:  Showed temperature 98.2, pulse 71, respiratory rate 18, blood pressure 119/60. HEAD AND NECK:  Normocephalic, atraumatic skull.  There are no ocular or oral lesions.  There is no palpable cervical or supraclavicular lymph nodes. LUNGS:  Clear bilaterally. CARDIAC:  Regular rate and rhythm with normal S1 and S2.  There are no murmurs, rubs, or bruits. ABDOMEN:  Soft.  She has good bowel sounds.  There is no fluid wave. There is no guarding or rebound tenderness.  There is no palpable hepatosplenomegaly. BACK:  No tenderness of the spine, ribs, or hips. EXTREMITIES:  Show no clubbing, cyanosis, or edema. NEUROLOGICAL:  Shows no focal neurological deficits.  LABORATORY  STUDIES:  White cell count 9.9, hemoglobin 12.4, hematocrit 34.7, platelet count 263.  Sodium 133, potassium 3.9, BUN 4, creatinine 0.45.  Calcium 8.7.  IMPRESSION:  [name] is a 47 year old white female with locally advanced/metastatic adenocarcinoma of the esophagus whether this is esophageal or GE junction, I think its not that important at this point.  We will have to try to see the path report.  If this is truly adenocarcinoma, she will definitely need to have a HER2 study done.  I do think that a PET scan is valuable here.  This can help [place] identify whether or not this is metastatic or not.  I think an endoscopic ultrasound also would be helpful.  Again, staging is the key in this situation.  Staging will determine whether or not this is that tumor that could be resected at some point.  There is the possibility for resection, then I would definitely favor neoadjuvant chemo and radiation therapy.  She is young, she has a fairly decent performance status.  I think she would be able to tolerate therapy fairly well.  Again, I do not see that we can do anything else with her as an inpatient.  I would probably would get an MRI of the brain just to make sure that there is no metastatic disease.  We will certainly follow along, I will see if Dr. Truett Perna can follow her as an outpatient.  She has transportation difficulties.  She lives close to the [facility], which makes things lot easier for her.  She is awful nice.  I had a nice time talking with her.     Josph Macho, M.D.     PRE/MEDQ  D:  08/04/2013  T:  08/05/2013  Job:  [id number]

## 2013-08-11 ENCOUNTER — Encounter (HOSPITAL_COMMUNITY): Payer: Self-pay | Admitting: Gastroenterology

## 2013-08-12 ENCOUNTER — Other Ambulatory Visit: Payer: Self-pay | Admitting: Oncology

## 2013-08-12 ENCOUNTER — Telehealth: Payer: Self-pay | Admitting: Oncology

## 2013-08-12 NOTE — Telephone Encounter (Signed)
s.w. pt and advised on 10.8 and 10.9 appts...pt ok and aware

## 2013-08-13 ENCOUNTER — Other Ambulatory Visit (HOSPITAL_BASED_OUTPATIENT_CLINIC_OR_DEPARTMENT_OTHER): Payer: Medicare Other | Admitting: Lab

## 2013-08-13 ENCOUNTER — Telehealth: Payer: Self-pay | Admitting: *Deleted

## 2013-08-13 ENCOUNTER — Ambulatory Visit (HOSPITAL_BASED_OUTPATIENT_CLINIC_OR_DEPARTMENT_OTHER): Payer: Medicare Other | Admitting: Oncology

## 2013-08-13 ENCOUNTER — Encounter: Payer: Self-pay | Admitting: Oncology

## 2013-08-13 ENCOUNTER — Ambulatory Visit: Payer: Medicare Other

## 2013-08-13 ENCOUNTER — Ambulatory Visit (HOSPITAL_BASED_OUTPATIENT_CLINIC_OR_DEPARTMENT_OTHER): Payer: Medicare Other

## 2013-08-13 VITALS — BP 116/72 | HR 89 | Temp 98.4°F | Resp 18 | Ht 63.0 in | Wt 119.8 lb

## 2013-08-13 VITALS — BP 115/61 | HR 78 | Temp 97.8°F | Resp 18

## 2013-08-13 DIAGNOSIS — C155 Malignant neoplasm of lower third of esophagus: Secondary | ICD-10-CM

## 2013-08-13 DIAGNOSIS — C159 Malignant neoplasm of esophagus, unspecified: Secondary | ICD-10-CM | POA: Insufficient documentation

## 2013-08-13 DIAGNOSIS — R634 Abnormal weight loss: Secondary | ICD-10-CM

## 2013-08-13 DIAGNOSIS — E876 Hypokalemia: Secondary | ICD-10-CM

## 2013-08-13 DIAGNOSIS — G35 Multiple sclerosis: Secondary | ICD-10-CM | POA: Insufficient documentation

## 2013-08-13 DIAGNOSIS — G35D Multiple sclerosis, unspecified: Secondary | ICD-10-CM

## 2013-08-13 LAB — COMPREHENSIVE METABOLIC PANEL (CC13)
ALT: 14 U/L (ref 0–55)
AST: 23 U/L (ref 5–34)
Albumin: 2.7 g/dL — ABNORMAL LOW (ref 3.5–5.0)
Alkaline Phosphatase: 160 U/L — ABNORMAL HIGH (ref 40–150)
Anion Gap: 10 mEq/L (ref 3–11)
BUN: 6.8 mg/dL — ABNORMAL LOW (ref 7.0–26.0)
CO2: 35 mEq/L — ABNORMAL HIGH (ref 22–29)
Calcium: 9.2 mg/dL (ref 8.4–10.4)
Chloride: 96 mEq/L — ABNORMAL LOW (ref 98–109)
Creatinine: 0.7 mg/dL (ref 0.6–1.1)
Glucose: 106 mg/dl (ref 70–140)
Potassium: 2.7 mEq/L — CL (ref 3.5–5.1)
Sodium: 140 mEq/L (ref 136–145)
Total Bilirubin: 0.27 mg/dL (ref 0.20–1.20)
Total Protein: 6.5 g/dL (ref 6.4–8.3)

## 2013-08-13 LAB — CBC WITH DIFFERENTIAL/PLATELET
BASO%: 0.4 % (ref 0.0–2.0)
Basophils Absolute: 0 10*3/uL (ref 0.0–0.1)
EOS%: 1.7 % (ref 0.0–7.0)
HCT: 38.8 % (ref 34.8–46.6)
LYMPH%: 46 % (ref 14.0–49.7)
MCHC: 34 g/dL (ref 31.5–36.0)
MONO#: 0.4 10*3/uL (ref 0.1–0.9)
NEUT#: 2.9 10*3/uL (ref 1.5–6.5)
RBC: 4.2 10*6/uL (ref 3.70–5.45)
WBC: 6.3 10*3/uL (ref 3.9–10.3)
lymph#: 2.9 10*3/uL (ref 0.9–3.3)

## 2013-08-13 MED ORDER — POTASSIUM CHLORIDE ER 10 MEQ PO TBCR: 20.0000 meq | EXTENDED_RELEASE_TABLET | Freq: Two times a day (BID) | ORAL | Status: DC

## 2013-08-13 MED ORDER — SODIUM CHLORIDE 0.9 % IV SOLN
INTRAVENOUS | Status: DC
  Administered 2013-08-13: 17:00:00 via INTRAVENOUS

## 2013-08-13 MED ORDER — SODIUM CHLORIDE 0.9 % IV SOLN
INTRAVENOUS | Status: DC
  Filled 2013-08-13: qty 1000

## 2013-08-13 NOTE — Patient Instructions (Signed)
Call if needed prior to scheduled visits   [phone]  Increase potassium to total 4 of the 10 mEq tablets today. We will give IVF with K tomorrow at [facility]

## 2013-08-13 NOTE — Patient Instructions (Signed)
Hypokalemia Hypokalemia means a low potassium level in the blood.Potassium is an electrolyte that helps regulate the amount of fluid in the body. It also stimulates muscle contraction and maintains a stable acid-base balance.Most of the body's potassium is inside of cells, and only a very small amount is in the blood. Because the amount in the blood is so small, minor changes can have big effects. PREPARATION FOR TEST Testing for potassium requires taking a blood sample taken by needle from a vein in the arm. The skin is cleaned thoroughly before the sample is drawn. There is no other special preparation needed. NORMAL VALUES Potassium levels below 3.5 mEq/L are abnormally low. Levels above 5.1 mEq/L are abnormally high. Ranges for normal findings may vary among different laboratories and hospitals. You should always check with your doctor after having lab work or other tests done to discuss the meaning of your test results and whether your values are considered within normal limits. MEANING OF TEST  Your caregiver will go over the test results with you and discuss the importance and meaning of your results, as well as treatment options and the need for additional tests, if necessary. A potassium level is frequently part of a routine medical exam. It is usually included as part of a whole "panel" of tests for several blood salts (such as Sodium and Chloride). It may be done as part of follow-up when a low potassium level was found in the past or other blood salts are suspected of being out of balance. A low potassium level might be suspected if you have one or more of the following:  Symptoms of weakness.  Abnormal heart rhythms.  High blood pressure and are taking medication to control this, especially water pills (diuretics).  Kidney disease that can affect your potassium level .  Diabetes requiring the use of insulin. The potassium may fall after taking insulin, especially if the diabetes  had been out of control for a while.  A condition requiring the use of cortisone-type medication or certain types of antibiotics.  Vomiting and/or diarrhea for more than a day or two.  A stomach or intestinal condition that may not permit appropriate absorption of potassium.  Fainting episodes.  Mental confusion. OBTAINING TEST RESULTS It is your responsibility to obtain your test results. Ask the lab or department performing the test when and how you will get your results.  Please contact your caregiver directly if you have not received the results within one week. At that time, ask if there is anything different or new you should be doing in relation to the results. TREATMENT Hypokalemia can be treated with potassium supplements taken by mouth and/or adjustments in your current medications. A diet high in potassium is also helpful. Foods with high potassium content are:  Peas, lentils, lima beans, nuts, and dried fruit.  Whole grain and bran cereals and breads.  Fresh fruit, vegetables (bananas, cantaloupe, grapefruit, oranges, tomatoes, honeydew melons, potatoes).  Orange and tomato juices.  Meats. If potassium supplement has been prescribed for you today or your medications have been adjusted, see your personal caregiver in time02 for a re-check. SEEK MEDICAL CARE IF:  There is a feeling of worsening weakness.  You experience repeated chest palpitations.  You are diabetic and having difficulty keeping your blood sugars in the normal range.  You are experiencing vomiting and/or diarrhea.  You are having difficulty with any of your regular medications. SEEK IMMEDIATE MEDICAL CARE IF:  You experience chest pain, shortness of   breath, or episodes of dizziness.  You have been having vomiting or diarrhea for more than 2 days.  You have a fainting episode. MAKE SURE YOU:   Understand these instructions.  Will watch your condition.  Will get help right away if you are  not doing well or get worse. Document Released: 10/23/2005 Document Revised: 01/15/2012 Document Reviewed: 10/03/2008 [facility] Patient Information 2014 [place], [place].  Dehydration, Adult Dehydration is when you lose more fluids from the body than you take in. Vital organs like the kidneys, brain, and heart cannot function without a proper amount of fluids and salt. Any loss of fluids from the body can cause dehydration.  CAUSES   Vomiting.  Diarrhea.  Excessive sweating.  Excessive urine output.  Fever. SYMPTOMS  Mild dehydration  Thirst.  Dry lips.  Slightly dry mouth. Moderate dehydration  Very dry mouth.  Sunken eyes.  Skin does not bounce back quickly when lightly pinched and released.  Dark urine and decreased urine production.  Decreased tear production.  Headache. Severe dehydration  Very dry mouth.  Extreme thirst.  Rapid, weak pulse (more than 100 beats per minute at rest).  Cold hands and feet.  Not able to sweat in spite of heat and temperature.  Rapid breathing.  Blue lips.  Confusion and lethargy.  Difficulty being awakened.  Minimal urine production.  No tears. DIAGNOSIS  Your caregiver will diagnose dehydration based on your symptoms and your exam. Blood and urine tests will help confirm the diagnosis. The diagnostic evaluation should also identify the cause of dehydration. TREATMENT  Treatment of mild or moderate dehydration can often be done at home by increasing the amount of fluids that you drink. It is best to drink small amounts of fluid more often. Drinking too much at one time can make vomiting worse. Refer to the home care instructions below. Severe dehydration needs to be treated at the hospital where you will probably be given intravenous (IV) fluids that contain water and electrolytes. HOME CARE INSTRUCTIONS   Ask your caregiver about specific rehydration instructions.  Drink enough fluids to keep your urine clear or  pale yellow.  Drink small amounts frequently if you have nausea and vomiting.  Eat as you normally do.  Avoid:  Foods or drinks high in sugar.  Carbonated drinks.  Juice.  Extremely hot or cold fluids.  Drinks with caffeine.  Fatty, greasy foods.  Alcohol.  Tobacco.  Overeating.  Gelatin desserts.  Wash your hands well to avoid spreading bacteria and viruses.  Only take over-the-counter or prescription medicines for pain, discomfort, or fever as directed by your caregiver.  Ask your caregiver if you should continue all prescribed and over-the-counter medicines.  Keep all follow-up appointments with your caregiver. SEEK MEDICAL CARE IF:  You have abdominal pain and it increases or stays in one area (localizes).  You have a rash, stiff neck, or severe headache.  You are irritable, sleepy, or difficult to awaken.  You are weak, dizzy, or extremely thirsty. SEEK IMMEDIATE MEDICAL CARE IF:   You are unable to keep fluids down or you get worse despite treatment.  You have frequent episodes of vomiting or diarrhea.  You have blood or green matter (bile) in your vomit.  You have blood in your stool or your stool looks black and tarry.  You have not urinated in 6 to 8 hours, or you have only urinated a small amount of very dark urine.  You have a fever.  You faint. MAKE SURE  YOU:   Understand these instructions.  Will watch your condition.  Will get help right away if you are not doing well or get worse. Document Released: 10/23/2005 Document Revised: 01/15/2012 Document Reviewed: 06/12/2011 [facility] Patient Information 2014 [place], [place].

## 2013-08-13 NOTE — Progress Notes (Signed)
Checked in new patient and gave appt card. No financial issues. Mail and phone only for communication.

## 2013-08-13 NOTE — Progress Notes (Signed)
[facility] NEW PATIENT EVALUATION   Name: [name] Date: 08/13/2013 MRN: [id number] DOB: August 03, 1966  REFERRING PHYSICIAN: Jeani Hawking CC: Fleet Contras (PCP), Dorisann Frames, Trudie Buckler ([place], [place], neurology), Reddy/ Dartha Lodge NP ([facility]), K.Hilty (cardiology)   REASON FOR REFERRAL: newly diagnosed esophageal carcinoma. Note new patient consultation done in hospital 08-05-13 by Dr Myna Hidalgo, however this is the first time that I have seen patient.   HISTORY OF PRESENT ILLNESS:[name] is a 47 y.o. female who is seen in consultation, together with mother, at the request of Dr Jeani Hawking with new diagnosis of esophageal cancer. Full staging has not been completed as yet, and patient has multiple significant comorbidities. (Note her mother, [name], is also my patient, with history of breast cancer and rectal cancer).  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 Last upper endoscopy prior to recent evaluation was 3 years ago and reportedly was normal. More recently she has developed difficulty swallowing and different pain, which extends from epigastrium to LLQ. She had EGD by Dr [name] in [place] which found a stenosed area in distal esophagus which was friable, with abnormal mucosa there and in gastric lumen. Pathology from [facility] reportedly showed adenocarcinoma; that report is not in records available to me now and has been requested. She was hospitalized in [facility] system from 9-23 thru 08-01-13 with  pneumonia, with SOB, hypoxemia and dizziness, also hypokalemic then.Blood and sputum cultures had no growth other than few candida in the sputum. She had CT CAP in hospital on 07-31-13, with extensive ground glass opacities in lungs with diffuse peribronchial thickening, esophageal  dilatation with confluent soft tissue density involving distal esophagus and GE junction/ adjacent stomach, enlarged AP window node and prominent subcarinal node, mildly enlarged nodes retroperitoneum and root of mesentery, nodal mass 5.4 x 2.7 cm encasing celiac axis and proximal superior mesenteric artery, diffuse hepatic steatosis and bilateral adrenal hyperplasia.  She also had endoscopic ultrasound by Dr [name] on 08-08-13, with stenosing and friable mass from 40 cm extending 5 cm into proximal stomach with maximal depth of invasion 28mm, extending beyond the adventitia; two large lymph nodes were identified, measuring 10mm, however celiac axis area could not be evaluated. Dr Haywood Pao impression at that procedure was of T4N2 distal esophageal/ proximal gastric tumor. MRI head 08-04-13 was unremarkable.   REVIEW OF SYSTEMS as above, also: Usual good weight ~ 135 lbs, continuing to lose weight. Difficulty swallowing solids, is eating oatmeal, jello, liquids but has not added supplements. History of migraines but none recently, no particular medications for these. Usually wears corrective lenses, no sinus problems, no difficulty hearing, has dentures. No respiratory problems until recent pneumonia, not SOB now and denies cough or sputum. No fever either with pneumonia or since. Stopped smoking in hospital, now using nicotine patch. No chest pain or palpitations. No noted changes in breasts, overdue mammograms. No diarrhea. Reflux x years. 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 No blood clots or bleeding. Does not have menstrual periods. Is able to sleep. Not orthostatic now but not taking fluids well and feels dehydrated. Very difficult peripheral IV access.  Remainder of full 10 point review of systems negative.   ALLERGIES: Erythromycin and Morphine and related  PAST MEDICAL/ SURGICAL HISTORY:    Recurrent pancreatitis following cholecystectomy in 1993, improved after sphincterotomy and stents ~ 2 years  ago by Dr [name] at  [facility] (now in GI at [facility] [address]) Multiple sclerosis, followed by Dr D.Leotis Shames initially at [facility] and now in [place] Graves disease diagnosed 2013, post radioactive iodine and now on synthroid Long QT syndrome IBS Anxiety On methadone x 3 years thru [facility]  CURRENT MEDICATIONS: reviewed as listed now in EMR, including K+ 10 mEq daily; with increase in K dose, we will cover this prescription. Note she has new prescription for zofran by Dr [name] today. Methadone is prescribed by [facility]. She had flu vaccine this fall.   PHARMACY: [facility]   SOCIAL HISTORY:  From [place], lives adjacent to mother, single. 4 children ages 81, 3, 65 and 69 (55 yo lives with father, others live with patient). Previously worked as [employer] fundraiser. Long tobacco DCd during recent hospitalization. Denies drugs or ETOH presently   FAMILY HISTORY:  Mother with breast ca and rectal ca, both curatively treated. Father HTN Maternal aunt died with lung ca Maternal grandfather died ALS Paternal grandfather died esophageal Ca DM, HTN Children healthy other than one child autistic         PHYSICAL EXAM:  height is 5\' 3"  (1.6 m) and weight is 119 lb 12.8 oz (54.341 kg). Her oral temperature is 98.4 F (36.9 C). Her blood pressure is 116/72 and her pulse is 89. Her respiration is 18.  Alert, pleasant, cooperative, looks stated age and mildly uncomfortable. Ambulatory without assistance. Respirations not labored RA. Color a little poor bu not icteric.  HEENT:normal hair pattern. PERRL. Dentures. Oral mucosa somewhat dry, no lesions. Neck supple without JVD. No apparent thyroid mass.   RESPIRATORY: diminished breath sounds thruout otherwise clear without wheezes, rales. Hyperresonant to percussion. No use of accessory muscles  CARDIAC/ VASCULAR: RRR no murmur or gallop.   ABDOMEN: soft, not obviously distended, few bowel sounds, not tender to gentle palpation, no clear HSM or mass  LYMPH  NODES: no cervical, supraclavicular, axillary or inguinal nodes  BREASTS: without dominant mass, skin or nipple findings. Axillae benign.  NEUROLOGIC/ PSYCH: CN, motor, sensory, cerebellar without focal deficits. Oriented and appropriate affect.  SKIN: multiple resolving ecchymoses UE bilaterally at sites of IVs and venipunctures during recent hospitalization and procedures.  MUSCULOSKELETAL:LE without clubbing, cyanosis, edema. Back not tender.    LABORATORY DATA:  Results for orders placed in visit on 08/13/13 (from the past 48 hour(s))  CBC WITH DIFFERENTIAL     Status: Abnormal   Collection Time    08/13/13  3:19 PM      Result Value Range   WBC 6.3  3.9 - 10.3 10e3/uL   NEUT# 2.9  1.5 - 6.5 10e3/uL   HGB 13.2  11.6 - 15.9 g/dL   HCT 95.6  21.3 - 08.6 %   Platelets 543 (*) 145 - 400 10e3/uL   MCV 92.4  79.5 - 101.0 fL   MCH 31.4  25.1 - 34.0 pg   MCHC 34.0  31.5 - 36.0 g/dL   RBC 5.78  4.69 - 6.29 10e6/uL   RDW 13.6  11.2 - 14.5 %   lymph# 2.9  0.9 - 3.3 10e3/uL   MONO# 0.4  0.1 - 0.9 10e3/uL   Eosinophils Absolute 0.1  0.0 - 0.5 10e3/uL   Basophils Absolute 0.0  0.0 - 0.1 10e3/uL   NEUT% 45.6  38.4 - 76.8 %   LYMPH% 46.0  14.0 - 49.7 %   MONO% 6.3  0.0 - 14.0 %   EOS%  1.7  0.0 - 7.0 %   BASO% 0.4  0.0 - 2.0 %  COMPREHENSIVE METABOLIC PANEL (CC13)     Status: Abnormal   Collection Time    08/13/13  3:20 PM      Result Value Range   Sodium 140  136 - 145 mEq/L   Potassium 2.7 (*) 3.5 - 5.1 mEq/L   Chloride 96 (*) 98 - 109 mEq/L   CO2 35 (*) 22 - 29 mEq/L   Glucose 106  70 - 140 mg/dl   BUN 6.8 (*) 7.0 - 16.1 mg/dL   Creatinine 0.7  0.6 - 1.1 mg/dL   Total Bilirubin 0.96  0.20 - 1.20 mg/dL   Alkaline Phosphatase 160 (*) 40 - 150 U/L   AST 23  5 - 34 U/L   ALT 14  0 - 55 U/L   Total Protein 6.5  6.4 - 8.3 g/dL   Albumin 2.7 (*) 3.5 - 5.0 g/dL   Calcium 9.2  8.4 - 04.5 mg/dL   Anion Gap 10  3 - 11 mEq/L     Chemistries resulted while patient was still at  office, and she was then given 1 liter NS with 20 mEq K, as well as instructed to increase K to total 40 mEq po today and daily  PATHOLOGY: Outside report requested from Dr [name], not yet in this EMR. I do not know HER 2 status   RADIOGRAPHY: CT CHEST, ABDOMEN, AND PELVIS WITH CONTRAST  TECHNIQUE:  Multidetector CT imaging of the chest, abdomen and pelvis was  performed following the standard protocol during bolus  administration of intravenous contrast.  CONTRAST: 80mL OMNIPAQUE IOHEXOL 300 MG/ML SOLN  COMPARISON: Chest radiographs dated 07/29/2013.  FINDINGS:  CT CHEST FINDINGS  Patchy prominence of the interstitial markings in both lungs, most  pronounced in the upper lobes. This includes extensive ground-glass  opacities. There is also diffuse peribronchial thickening. No lung  masses are seen.  Mild to moderate diffuse esophageal dilatation containing fluid in  the lumen. There is confluent soft tissue density involving the  distal esophagus and gastroesophageal junction and adjacent proximal  stomach. Evaluation of the stomach is limited by a poor distention  of the stomach.  An enlarged AP window lymph node is demonstrated with a short axis  diameter of 14 mm on image 17. Mildly prominent subcarinal lymph  node with a short axis diameter of 11 mm on image number 26. Minimal  bilateral pleural fluid. Unremarkable bones.  CT ABDOMEN AND PELVIS FINDINGS  Diffuse low density of the liver relative to the spleen.  Cholecystectomy clips. Mildly enlarged adrenal glands without a  discrete mass. Unremarkable spleen, pancreas, kidneys, urinary  bladder, uterus and ovaries. No intestinal abnormalities. Mildly  enlarged lymph nodes in the retroperitoneum and root of the  mesentery with a larger, more confluent lymph node mass with poorly  defined margins partially encasing the celiac axis and proximal  superior mesenteric artery. The confluent lymph node mass measures  5.4 x 2.7 cm in  maximum dimensions on image 9. A left para-aortic  node at the level of the mid left kidney has a short axis diameter  13 mm on image 20. A lymph node mass at the base of the mesentery,  below the gastrohepatic ligament, has a short axis diameter of 12 mm  on image 8. There is a gastrohepatic ligament node with a short axis  diameter of 10 mm on image 4. Unremarkable lumbar spine.  IMPRESSION:  CT CHEST IMPRESSION  1. Mass involving the mid distal esophagus, gastroesophageal  junction and proximal stomach, compatible with the patient's known  esophageal carcinoma.  2. Extensive bilateral interstitial pneumonitis.  3. Mild mediastinal adenopathy. This could be metastatic or reactive  in nature.  CT ABDOMEN AND PELVIS IMPRESSION  1. Metastatic upper abdominal adenopathy, as described above.  2. Diffuse hepatic steatosis.  3. Mild bilateral adrenal hyperplasia.     DISCUSSION: Patient understands that PET will be useful in confirming extent of disease involvement now, but that most likely concurrent chemoRT will be recommendation. We will try to proceed as quickly as possible, realizing that the PET will need to be precertified. We will refer to radiation oncology, have her attend chemotherapy teaching class anticipating low dose weekly taxol carboplatin with RT, and get PAC by IR. She will have IVF with IV K today and increase po K as above. She will need to meet with dietician and should add supplements now. We did not discuss PEG, but this may be needed. I will see her again on 08-25-13, which should be after most or all of this is completed.      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 Likely will treat with chemoRT, with weekly carbo taxol. Other plans as above. Referral to dietician with ongoing weight loss and difficult po's. 2.chronic  pancreatitis since cholecystectomy 1993, better in past 2 years post sphincterotomy and stents 3. On methadone maintenance for drug history 4.long tobacco just DCd 5. 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 WIll check magnesium with next labs. Interventions as above. 12. Interstitial pneumonia etiology not clear, symptoms improved with interventions in hospital.  Patient and mother have had questions answered to their satisfaction and are in agreement with plan above. They can contact this office for questions or concerns at any time prior to next scheduled visit.  Time spent 50 min , including >50% discussion and coordination of care.    LIVESAY,LENNIS P, MD 08/13/2013 7:55 PM

## 2013-08-13 NOTE — Telephone Encounter (Signed)
appts made and printed. Pt is aware tthat cs will call w/ an appt for her PET. She's also aware that Lynden Ang stated that Inetta Fermo will call her with an IR appt. Pt is getting fluids today instead of on 08/14/13 @ 4pm....td

## 2013-08-14 ENCOUNTER — Ambulatory Visit: Payer: Medicare Other | Admitting: Nutrition

## 2013-08-14 ENCOUNTER — Encounter: Payer: Self-pay | Admitting: Radiation Oncology

## 2013-08-14 ENCOUNTER — Telehealth: Payer: Self-pay | Admitting: *Deleted

## 2013-08-14 NOTE — Progress Notes (Signed)
Patient is a 47 year old female diagnosed with esophageal cancer.  She is a patient of Dr. Darrold Span.  Past medical history includes IBS, Graves' disease, PTSD, pancreatitis, arthritis, and anxiety.  Medications include Flexeril, Lexapro, Nexium, Synthroid, multivitamin, Zofran, and K-Dur.  Labs include potassium 2.7, BUN 6.8, and albumin 2.7.  Height: 63 inches. Weight: 119.8 on October 8. Usual body weight: 155 pounds February 2013. BMI: 21.23.  Patient reports 20 pound weight loss over last 6 weeks.  She complains of difficulty swallowing and nausea.  She denies problems swallowing liquids.  She requires easy to prepare foods. She complains of fatigue.  Nutrition diagnosis: Unintended weight loss related to esophageal cancer and associated symptoms as evidenced by 20 pound weight loss in 6 weeks.  Intervention: Patient was educated to increase meals and snacks to 6 times daily.  I have reviewed the importance of increasing protein and given her examples of protein containing foods.  I have educated her on strategies for eating with nausea.  I've also educated her on strategies for ease of swallowing.  I provided samples of oral nutrition supplements and coupons.  I've encouraged patient to try to incorporate oral nutrition supplements twice daily between meals.  I provided fact sheets and my contact information.  Questions were answered.  Teach back method used.  Monitoring, evaluation, goals: Patient will tolerate increased oral intake to minimize further weight loss.  Next visit: I will follow patient during chemotherapy.

## 2013-08-14 NOTE — Telephone Encounter (Signed)
INFORMED TONI THAT DR.LIVESAY IS OUT OF THE OFFICE UNTIL Monday. THE ISSUE OF SCHEDULING PT.'S PET SCAN WILL BE ADDRESSED AT THAT TIME.

## 2013-08-14 NOTE — Progress Notes (Signed)
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 ? Neoadjuvant chemotherapy  Weight changes, if any: 20 lb weight loss six week prior to 08/14/13  Bowel/Bladder complaints, if any:   Nausea / Vomiting, if any: Constant nausea  Pain issues, if any:  Pain a level 4 in the Mid esophagus to left upper abd. Regions.  Food gets stuck in the mid esophageal region and her esophagus and abdomen burns as if it is raw when food passes.  SAFETY ISSUES:  Prior radiation? No  Pacemaker/ICD? No  Possible current pregnancy? No  Is the patient on methotrexate? No  Current Complaints / other details:  Has a history of sphincter 40 dysfunction and recurrent pancreatitis.  Diagnosis of pneumonia 07/28/13.   Graves disease and had treatment for her  Thyroid - Radioactive Iodine - September 2013

## 2013-08-14 NOTE — Telephone Encounter (Signed)
DR.LIVESAY HAS AN APPOINTMENT SCHEDULED WITH PT. ON 08/25/13. DO ANY ADJUSTMENTS NEED TO BE MADE WITH PT.'S APPOINTMENT? THIS NOTE TO DR.LIVESAY'S DESK.

## 2013-08-15 ENCOUNTER — Encounter: Payer: Self-pay | Admitting: Radiation Oncology

## 2013-08-15 ENCOUNTER — Other Ambulatory Visit: Payer: Self-pay | Admitting: Oncology

## 2013-08-15 ENCOUNTER — Ambulatory Visit
Admission: RE | Admit: 2013-08-15 | Discharge: 2013-08-15 | Disposition: A | Discharge status: Home / Self Care | Payer: Medicare Other | Source: Ambulatory Visit | Attending: Radiation Oncology | Admitting: Radiation Oncology

## 2013-08-15 ENCOUNTER — Encounter: Payer: Self-pay | Admitting: Oncology

## 2013-08-15 ENCOUNTER — Other Ambulatory Visit: Payer: Self-pay | Admitting: Radiology

## 2013-08-15 VITALS — BP 121/73 | HR 77 | Temp 98.5°F | Ht 63.0 in | Wt 122.8 lb

## 2013-08-15 DIAGNOSIS — C159 Malignant neoplasm of esophagus, unspecified: Secondary | ICD-10-CM

## 2013-08-15 DIAGNOSIS — C155 Malignant neoplasm of lower third of esophagus: Secondary | ICD-10-CM

## 2013-08-15 DIAGNOSIS — E0500 Thyrotoxicosis with diffuse goiter without thyrotoxic crisis or storm: Secondary | ICD-10-CM | POA: Insufficient documentation

## 2013-08-15 DIAGNOSIS — G35 Multiple sclerosis: Secondary | ICD-10-CM | POA: Insufficient documentation

## 2013-08-15 DIAGNOSIS — Z87891 Personal history of nicotine dependence: Secondary | ICD-10-CM | POA: Insufficient documentation

## 2013-08-15 DIAGNOSIS — E876 Hypokalemia: Secondary | ICD-10-CM

## 2013-08-15 DIAGNOSIS — K7689 Other specified diseases of liver: Secondary | ICD-10-CM | POA: Insufficient documentation

## 2013-08-15 DIAGNOSIS — C160 Malignant neoplasm of cardia: Secondary | ICD-10-CM | POA: Insufficient documentation

## 2013-08-15 HISTORY — DX: Malignant neoplasm of esophagus, unspecified: C15.9

## 2013-08-15 NOTE — Progress Notes (Signed)
Radiation Oncology         [id number]) [phone] ________________________________  Initial outpatient Consultation  Name: [name] MRN: [id number]  Date: 08/15/2013  DOB: 05/12/1966  NF:AOZHYQM,VHQIO A, MD  Alicia Packer, MD   REFERRING PHYSICIAN: Reece Packer, MD  DIAGNOSIS: clinical T4N2Mx esophageal cancer, adenocarcinoma, poorly differentiated, lower third  HISTORY OF PRESENT ILLNESS::[name] is a 47 y.o. female who  reports 20 pound weight loss over the past 8 weeks. She also has a history of chronic reflux and her heartburn has been worsening. She has a history of difficulty swallowing but no frank choking on her food -- although more recently she feels like food is getting stuck in her mid/lower esophagus. She is now only able to eat liquids, and soft noodles or puddings.  She was even more restricted before undergoing recent esophageal dilation by Dr. Elnoria [name]. She reports nausea and pain in the lower chest and upper left abdomen. This pain radiates to her back bilaterally.  Biopsy of lower third of esophagus on 9-18 revealed poorly differentiated adenocarcinoma; bx of stomach fundus was negative for malignancy.  She underwent endoscopic ultrasound and EGD on 08/08/2013. In the distal esophagus starting at 40 cm and extending 5 cm in the proximal stomach there is a stenosing a friable mass. The endoscope was able to pass through this moderate difficulty in the area was balloon dilated. The tumor extended beyond the adventitia. There were 2 large peritumoral lymph nodes measuring 10 mm. Celiac axis could not be assessed due to patient restlessness. She has undergone an MRI of her brain which has some motion degradation but no obvious metastases. She has also undergone CT of the chest and abdomen as of 07/31/2013 which shows a mass involving the mid distal esophagus and gastroesophageal junction and proximal stomach and mild mediastinal adenopathy. Furthermore she has metastatic upper  abdominal adenopathy as described below. She has bilateral pneumonitis - unclear etiology, pt denies chronic pneumonitis, but she has a recent history of pneumonia.  PET scan is scheduled for 08/28/2013 and I will try to expedite this.  PREVIOUS RADIATION THERAPY: Yes - radioactive iodine 2013 for Graves' disease but no external beam radiotherapy  PAST MEDICAL HISTORY:  has a past medical history of Multiple sclerosis; Irritable bowel syndrome; Arthritis; Pancreatitis; Anxiety; Depression; Chronic pain; Migraine; PTSD (post-traumatic stress disorder); Methadone use; PONV (postoperative nausea and vomiting); Esophageal cancer (07/24/13); Grave's disease; and hypothyroidism.    PAST SURGICAL HISTORY: Past Surgical History  Procedure Laterality Date  . Sphincterotomy    . Cholecystectomy    . Eus N/A 08/08/2013    Procedure: ESOPHAGEAL ENDOSCOPIC ULTRASOUND (EUS) RADIAL;  Surgeon: Theda Belfast, MD;  Location: [facility];  Service: Endoscopy;  Laterality: N/A;  . Biopsy of esophagus  07/24/13    Poorly differentiatied Adenocarcinoma of the Lower Third of the Esophagus    FAMILY HISTORY: family history includes Cancer in her mother; Hypertension in her father.  SOCIAL HISTORY:  reports that she has been smoking.  She has never used smokeless tobacco. She reports that she does not drink alcohol or use illicit drugs.  ALLERGIES: Erythromycin and Morphine and related  MEDICATIONS:  Current Outpatient Prescriptions  Medication Sig Dispense Refill  . albuterol (PROVENTIL HFA;VENTOLIN HFA) 108 (90 BASE) MCG/ACT inhaler Inhale 2 puffs into the lungs every 4 (four) hours as needed for wheezing or shortness of breath.  1 Inhaler  0  . busPIRone (BUSPAR) 15 MG tablet Take 15 mg by mouth  2 (two) times daily.      . cyclobenzaprine (FLEXERIL) 10 MG tablet Take 10 mg by mouth 3 (three) times daily as needed. For muscle spasms.      . diclofenac sodium (VOLTAREN) 1 % GEL Apply 1 application topically 2  (two) times daily. Knees, hips, neck      . escitalopram (LEXAPRO) 10 MG tablet Take 30 mg by mouth daily.      Marland Kitchen esomeprazole (NEXIUM) 20 MG capsule Take 20 mg by mouth daily before breakfast.      . imipramine (TOFRANIL) 50 MG tablet Take 100 mg by mouth at bedtime.       Marland Kitchen levothyroxine (SYNTHROID, LEVOTHROID) 100 MCG tablet Take 100 mcg by mouth daily.      . Melatonin 3 MG TABS Take 1-2 tablets by mouth at bedtime as needed. For sleep      . methadone (DOLOPHINE) 10 MG/5ML solution Take 91 mg by mouth daily.       . Multiple Vitamin (MULTIVITAMIN WITH MINERALS) TABS Take 1 tablet by mouth daily.      . ondansetron (ZOFRAN) 8 MG tablet Take 8 mg by mouth every 8 (eight) hours as needed for nausea.      Marland Kitchen oxyCODONE-acetaminophen (ROXICET) 5-325 MG per tablet Take 1 tablet by mouth every 4 (four) hours as needed for pain.  12 tablet  0  . potassium chloride (K-DUR) 10 MEQ tablet Take 2 tablets (20 mEq total) by mouth 2 (two) times daily. Or as directed  120 tablet  0  . potassium chloride (K-DUR,KLOR-CON) 10 MEQ tablet Take 10 mEq by mouth daily.       No current facility-administered medications for this encounter.    REVIEW OF SYSTEMS:  Notable for that above.   PHYSICAL EXAM:  height is 5\' 3"  (1.6 m) and weight is 122 lb 12.8 oz (55.702 kg). Her temperature is 98.5 F (36.9 C). Her blood pressure is 121/73 and her pulse is 77.   General: Alert and oriented, in no acute distress HEENT: Head is normocephalic. Pupils are equally round and reactive to light. Extraocular movements are intact. Oropharynx is clear. Neck: Neck is supple, no palpable cervical or supraclavicular lymphadenopathy. Heart: Regular in rate and rhythm with no murmurs, rubs, or gallops. Chest: low pitched scattered sounds diffusely on inspiration and expiration Abdomen: firm, tender diffusely, but esp in LUQ,  no rigidity or guarding. Extremities: No cyanosis or edema. Lymphatics: No concerning lymphadenopathy. Skin:  No concerning lesions. Slightly yellow hue to skin. Musculoskeletal: has some generalized mild weakness, I helped her get on exam stable. Neurologic: Cranial nerves II through XII are grossly intact. No obvious focalities. Speech is fluent. Reports some balance issues Psychiatric: Judgment and insight are intact. Affect is appropriate.  ECOG = 1  LABORATORY DATA:  Lab Results  Component Value Date   WBC 6.3 08/13/2013   HGB 13.2 08/13/2013   HCT 38.8 08/13/2013   MCV 92.4 08/13/2013   PLT 543* 08/13/2013   CMP     Component Value Date/Time   NA 140 08/13/2013 1520   NA 133* 07/31/2013 1000   K 2.7* 08/13/2013 1520   K 3.9 07/31/2013 1000   CL 99 07/31/2013 1000   CO2 35* 08/13/2013 1520   CO2 25 07/31/2013 1000   GLUCOSE 106 08/13/2013 1520   GLUCOSE 74 07/31/2013 1000   BUN 6.8* 08/13/2013 1520   BUN 4* 07/31/2013 1000   CREATININE 0.7 08/13/2013 1520   CREATININE 0.45* 07/31/2013  1000   CALCIUM 9.2 08/13/2013 1520   CALCIUM 8.7 07/31/2013 1000   PROT 6.5 08/13/2013 1520   PROT 6.3 07/30/2013 0200   ALBUMIN 2.7* 08/13/2013 1520   ALBUMIN 2.6* 07/30/2013 0200   AST 23 08/13/2013 1520   AST 37 07/30/2013 0200   ALT 14 08/13/2013 1520   ALT 18 07/30/2013 0200   ALKPHOS 160* 08/13/2013 1520   ALKPHOS 163* 07/30/2013 0200   BILITOT 0.27 08/13/2013 1520   BILITOT 0.6 07/30/2013 0200   GFRNONAA >90 07/31/2013 1000   GFRAA >90 07/31/2013 1000         RADIOGRAPHY: Dg Chest 2 View  07/29/2013   CLINICAL DATA:  Shortness of breath. Chest pain with inspiration. Recently diagnosed with pneumonia. History of multiple sclerosis, irritable bowel syndrome, pancreatitis and Graves disease.  EXAM: CHEST  2 VIEW  COMPARISON:  07/28/2013 and 08/28/2012.  FINDINGS: The bilateral perihilar airspace opacities have not significantly changed from yesterday's examination. There is no significant pleural effusion. The heart size and mediastinal contours are stable with persistent fullness in the AP window which may reflect  lymphadenopathy. No acute osseous findings are seen. Cholecystectomy clips are again noted.  IMPRESSION: No significant change in perihilar airspace opacities and suspected AP window adenopathy compared with yesterday's examination. findings remain most consistent with pneumonia, although could reflect atypical edema or noninfectious inflammation. Continued radiographic follow up recommended.   Electronically Signed   By: Roxy Horseman   On: 07/29/2013 20:46   Dg Chest 2 View  07/28/2013   CLINICAL DATA:  Cough for 3 days. Cigarette smoker.  EXAM: CHEST  2 VIEW  COMPARISON:  08/28/2012.  FINDINGS: Cardiopericardial silhouette within normal limits for projection. There is bilateral perihilar airspace disease extending into both lower lobes. More focal airspace disease extends to the right upper lobe. There is no effusion. Cholecystectomy clips are present in the right upper quadrant. Trachea midline. Mediastinal contours demonstrate prominence in the AP window which may represent adenopathy.  Followup to ensure radiographic clearing and exclude an underlying lesion is recommended. Typically clearing will be observed at 8 weeks.  IMPRESSION: Diffuse perihilar airspace disease and more focal right upper lobe airspace disease. This is favored to represent multifocal infection although CHF with asymmetric/atypical pulmonary edema could produce a similar appearance.   Electronically Signed   By: Andreas Newport M.D.   On: 07/28/2013 20:39   Dg Abd 1 View  08/01/2013   *RADIOLOGY REPORT*  Clinical Data: Upper abdominal pain.  ABDOMEN - 1 VIEW  Comparison: CT abdomen pelvis 07/31/2013.  Findings: There is retained oral contrast in the colon.  No small bowel dilatation.  Surgical clips in the right upper quadrant. Phleboliths are seen in the anatomic pelvis.  IMPRESSION: No acute findings.   Original Report Authenticated By: Leanna Battles, M.D.   Ct Chest W Contrast  07/31/2013   CLINICAL DATA:  Recently diagnosed  distal esophageal carcinoma.  EXAM: CT CHEST, ABDOMEN, AND PELVIS WITH CONTRAST  TECHNIQUE: Multidetector CT imaging of the chest, abdomen and pelvis was performed following the standard protocol during bolus administration of intravenous contrast.  CONTRAST:  80mL OMNIPAQUE IOHEXOL 300 MG/ML  SOLN  COMPARISON:  Chest radiographs dated 07/29/2013.  FINDINGS: CT CHEST FINDINGS  Patchy prominence of the interstitial markings in both lungs, most pronounced in the upper lobes. This includes extensive ground-glass opacities. There is also diffuse peribronchial thickening. No lung masses are seen.  Mild to moderate diffuse esophageal dilatation containing fluid in the lumen. There  is confluent soft tissue density involving the distal esophagus and gastroesophageal junction and adjacent proximal stomach. Evaluation of the stomach is limited by a poor distention of the stomach.  An enlarged AP window lymph node is demonstrated with a short axis diameter of 14 mm on image 17. Mildly prominent subcarinal lymph node with a short axis diameter of 11 mm on image number 26. Minimal bilateral pleural fluid. Unremarkable bones.  CT ABDOMEN AND PELVIS FINDINGS  Diffuse low density of the liver relative to the spleen. Cholecystectomy clips. Mildly enlarged adrenal glands without a discrete mass. Unremarkable spleen, pancreas, kidneys, urinary bladder, uterus and ovaries. No intestinal abnormalities. Mildly enlarged lymph nodes in the retroperitoneum and root of the mesentery with a larger, more confluent lymph node mass with poorly defined margins partially encasing the celiac axis and proximal superior mesenteric artery. The confluent lymph node mass measures 5.4 x 2.7 cm in maximum dimensions on image 9. A left para-aortic node at the level of the mid left kidney has a short axis diameter 13 mm on image 20. A lymph node mass at the base of the mesentery, below the gastrohepatic ligament, has a short axis diameter of 12 mm on image  8. There is a gastrohepatic ligament node with a short axis diameter of 10 mm on image 4. Unremarkable lumbar spine.  IMPRESSION: CT CHEST IMPRESSION  1. Mass involving the mid distal esophagus, gastroesophageal junction and proximal stomach, compatible with the patient's known esophageal carcinoma. 2. Extensive bilateral interstitial pneumonitis. 3. Mild mediastinal adenopathy. This could be metastatic or reactive in nature.  CT ABDOMEN AND PELVIS IMPRESSION  1. Metastatic upper abdominal adenopathy, as described above. 2. Diffuse hepatic steatosis. 3. Mild bilateral adrenal hyperplasia.   Electronically Signed   By: Gordan Payment   On: 07/31/2013 17:04   Mr Brain W Wo Contrast  08/04/2013   CLINICAL DATA:  Esophageal cancer  EXAM: MRI HEAD WITHOUT AND WITH CONTRAST  TECHNIQUE: Multiplanar, multiecho pulse sequences of the brain and surrounding structures were obtained according to standard protocol without and with intravenous contrast  CONTRAST:  15mL MULTIHANCE GADOBENATE DIMEGLUMINE 529 MG/ML IV SOLN  COMPARISON:  MRI of the brain 07/20/2005.  FINDINGS: The study is again moderately degraded by patient motion. No acute infarct, hemorrhage, or mass lesion is present. The ventricles are of normal size. No significant extra-axial fluid collection is present.  The postcontrast images demonstrate no pathologic enhancement to suggest metastatic disease of the brain or meninges. Midline structures are within normal limits.  Flow is present in the major intracranial arteries. The globes and orbits are intact. The paranasal sinuses are clear. There is some fluid in the mastoid air cells, left greater than right.  IMPRESSION: 1. The study is moderately degraded by patient motion, limiting sensitivity of very small lesions. 2. Accounting for this, the MRI appearance of the brain is normal without evidence for metastatic disease. 3. Minimal mastoid fluid bilaterally, left greater than right. No obstructing nasopharyngeal  lesion is evident.   Electronically Signed   By: Gennette Pac   On: 08/04/2013 13:16   Ct Abdomen Pelvis W Contrast  07/31/2013   CLINICAL DATA:  Recently diagnosed distal esophageal carcinoma.  EXAM: CT CHEST, ABDOMEN, AND PELVIS WITH CONTRAST  TECHNIQUE: Multidetector CT imaging of the chest, abdomen and pelvis was performed following the standard protocol during bolus administration of intravenous contrast.  CONTRAST:  80mL OMNIPAQUE IOHEXOL 300 MG/ML  SOLN  COMPARISON:  Chest radiographs dated 07/29/2013.  FINDINGS:  CT CHEST FINDINGS  Patchy prominence of the interstitial markings in both lungs, most pronounced in the upper lobes. This includes extensive ground-glass opacities. There is also diffuse peribronchial thickening. No lung masses are seen.  Mild to moderate diffuse esophageal dilatation containing fluid in the lumen. There is confluent soft tissue density involving the distal esophagus and gastroesophageal junction and adjacent proximal stomach. Evaluation of the stomach is limited by a poor distention of the stomach.  An enlarged AP window lymph node is demonstrated with a short axis diameter of 14 mm on image 17. Mildly prominent subcarinal lymph node with a short axis diameter of 11 mm on image number 26. Minimal bilateral pleural fluid. Unremarkable bones.  CT ABDOMEN AND PELVIS FINDINGS  Diffuse low density of the liver relative to the spleen. Cholecystectomy clips. Mildly enlarged adrenal glands without a discrete mass. Unremarkable spleen, pancreas, kidneys, urinary bladder, uterus and ovaries. No intestinal abnormalities. Mildly enlarged lymph nodes in the retroperitoneum and root of the mesentery with a larger, more confluent lymph node mass with poorly defined margins partially encasing the celiac axis and proximal superior mesenteric artery. The confluent lymph node mass measures 5.4 x 2.7 cm in maximum dimensions on image 9. A left para-aortic node at the level of the mid left kidney  has a short axis diameter 13 mm on image 20. A lymph node mass at the base of the mesentery, below the gastrohepatic ligament, has a short axis diameter of 12 mm on image 8. There is a gastrohepatic ligament node with a short axis diameter of 10 mm on image 4. Unremarkable lumbar spine.  IMPRESSION: CT CHEST IMPRESSION  1. Mass involving the mid distal esophagus, gastroesophageal junction and proximal stomach, compatible with the patient's known esophageal carcinoma. 2. Extensive bilateral interstitial pneumonitis. 3. Mild mediastinal adenopathy. This could be metastatic or reactive in nature.  CT ABDOMEN AND PELVIS IMPRESSION  1. Metastatic upper abdominal adenopathy, as described above. 2. Diffuse hepatic steatosis. 3. Mild bilateral adrenal hyperplasia.   Electronically Signed   By: Gordan Payment   On: 07/31/2013 17:04      IMPRESSION/PLAN: This is a  [age] yo woman with locally advanced adenocarcinoma of the esophagus.  It is extensive (mid/distal esophagus with likely mediastinal adenopathy and nodes partially encasing the celiac axis and proximal superior mesenteric artery). PET for full staging to rule out distant mets is pending  -- I am trying to move this up to a sooner date.  She's been referred to nutrition and she recently quit smoking.  It was a pleasure meeting the patient today. We discussed the risks, benefits, and side effects (possibly including esophagitis, skin irritation, fatigue, nausea, diarrhea, injury to internal chest/abdomen organs) of radiotherapy. I recommend ~ 2 weeks of RT if her disease is distantly metastatic, with palliative goals. If locally advanced only, I recommend a 5 and a half week course.  There is a possibility of eventually resection after neoadjuvant ChRT but she's not seen a C-T surgeon yet. No guarantees of treatment were given. A consent form was signed and placed in the patient's medical record. The patient is enthusiastic about proceeding with treatment. I  look forward to participating in the patient's care. Simulation to occur [place], 10-13. IMRT may be needed to spare spinal cord, kidneys, bowel, lung, liver adequately.   I spent 55 minutes  face to face with the patient and more than 50% of that time was spent in counseling and/or coordination of care.  __________________________________________   Eppie Gibson, MD

## 2013-08-15 NOTE — Progress Notes (Signed)
Please see the Nurse Progress Note in the MD Initial Consult Encounter for this patient. 

## 2013-08-17 DIAGNOSIS — C155 Malignant neoplasm of lower third of esophagus: Secondary | ICD-10-CM | POA: Insufficient documentation

## 2013-08-18 ENCOUNTER — Telehealth: Payer: Self-pay | Admitting: *Deleted

## 2013-08-18 ENCOUNTER — Ambulatory Visit
Admission: RE | Admit: 2013-08-18 | Discharge: 2013-08-18 | Disposition: A | Discharge status: Home / Self Care | Payer: Medicare Other | Source: Ambulatory Visit | Attending: Radiation Oncology | Admitting: Radiation Oncology

## 2013-08-18 ENCOUNTER — Other Ambulatory Visit: Payer: Medicare Other

## 2013-08-18 ENCOUNTER — Other Ambulatory Visit: Payer: Self-pay | Admitting: Radiology

## 2013-08-18 VITALS — BP 110/66 | HR 91 | Temp 98.0°F | Resp 20 | Wt 119.6 lb

## 2013-08-18 DIAGNOSIS — R42 Dizziness and giddiness: Secondary | ICD-10-CM | POA: Insufficient documentation

## 2013-08-18 DIAGNOSIS — C155 Malignant neoplasm of lower third of esophagus: Secondary | ICD-10-CM

## 2013-08-18 DIAGNOSIS — Z510 Encounter for antineoplastic radiation therapy: Secondary | ICD-10-CM | POA: Insufficient documentation

## 2013-08-18 DIAGNOSIS — C159 Malignant neoplasm of esophagus, unspecified: Secondary | ICD-10-CM | POA: Insufficient documentation

## 2013-08-18 DIAGNOSIS — R5381 Other malaise: Secondary | ICD-10-CM | POA: Insufficient documentation

## 2013-08-18 DIAGNOSIS — R110 Nausea: Secondary | ICD-10-CM | POA: Insufficient documentation

## 2013-08-18 DIAGNOSIS — R000 Tachycardia, unspecified: Secondary | ICD-10-CM | POA: Insufficient documentation

## 2013-08-18 DIAGNOSIS — M549 Dorsalgia, unspecified: Secondary | ICD-10-CM | POA: Insufficient documentation

## 2013-08-18 NOTE — Progress Notes (Signed)
Patient was tried by Reinaldo Berber x 1 attempt Iv, unsuccessful swell, Reesa Chew, RN, from [facility], tried x 2, also unsuccessful at an IV start, , informed MD,Squire, will do without IV per MD and will try oral contrast,Md informed that patient will get a power porta cath next week, Patient, stated she wasn't sure if she could keep anything down,  Will try 9:27 AM

## 2013-08-18 NOTE — Telephone Encounter (Signed)
Called patient to inform of test, lvm for a return call 

## 2013-08-18 NOTE — Addendum Note (Signed)
Encounter addended by: Lowella Petties, RN on: 08/18/2013  9:30 AM<BR>     Documentation filed: Vitals Section

## 2013-08-18 NOTE — Telephone Encounter (Signed)
Message copied by Lorine Bears on Mon Aug 18, 2013  5:50 PM ------      Message from: Reece Packer      Created: Fri Aug 15, 2013 10:27 PM       All -- she needs follow up labs on Mon 10-13 if possible, potassium low last week. She will be at RT 10-13 early AM.        This was on POF from my visit but didn't get on schedule.      Thank you!      Lennis            Cc Imelda Pillow and Dr Basilio Cairo ------

## 2013-08-18 NOTE — Telephone Encounter (Signed)
Called and left voice message, asked if patient was on her way for 815 am  appt with nurse for IV start for a 9am Ct Simulation, waiting for call back, called upstairs, name was called no one answered 8:20 AM

## 2013-08-18 NOTE — Telephone Encounter (Signed)
Left a message in vm requesting that [name] call tomorrow am 08-19-13 to speak with the triage nurse to coordinate a lab appointment aroun d her pet scan 08-19-13 at 1400 for a b-met  and magnesium level.  Looks as thought she needs to reschedule chemotherapy education class prior to visit Monday 08-25-13 per [facility] note as she missed class today.

## 2013-08-18 NOTE — Progress Notes (Signed)
Patient arrived late 0858 am, gave name and dob as identification, not allergic to IV dye, not a diabetic, last labs 08/13/13 ,BUN=6.8l, Cr=0.7, attempted x 1 RAC, unsuccessful, pat is dehydrated, said sh tried to drink before coming, called Tonya,RN,charge nurse in med onc, she will send someone down, Percell Boston will attempt Iv start x 1, called Jehnna,therapist informed her of status  9:06 AM'

## 2013-08-18 NOTE — Progress Notes (Addendum)
  Radiation Oncology         ([phone]) [phone] ________________________________  Name: [name] MRN: [id number]  Date: 08/18/2013  DOB: 10-31-1966  SIMULATION AND TREATMENT PLANNING NOTE  outpatient  DIAGNOSIS:  Esophageal cancer  NARRATIVE:  The patient was brought to the CT Simulation planning suite.  Identity was confirmed.  All relevant records and images related to the planned course of therapy were reviewed.  The patient freely provided informed written consent to proceed with treatment after reviewing the details related to the planned course of therapy. The consent form was witnessed and verified by the simulation staff.    Then, the patient was set-up in a stable reproducible  supine position for radiation therapy.  CT images were obtained.  Surface markings were placed.  The CT images were loaded into the planning software.    TREATMENT PLANNING NOTE: Treatment planning then occurred.  The radiation prescription was entered and confirmed.    A total of 3 medically necessary complex treatment devices were fabricated and supervised by me - AP/PA fields with MLC's to block heart, lungs, stomach and VACLOCK. I have requested : 3D simulation is medically necessary for this case for the following reason:  To spare lungs, stomach, heart. (IMRT may be used if disease is not metastatic per PET)  The patient will receive 50.4 Gy in 28 fractions if disease is potentially curable per PET staging (PET tomorrow.)  Otherwise, 30 Gy in 10 fractions is a palliative option to her esophageal lesion.      -----------------------------------  Lonie Peak, MD

## 2013-08-19 ENCOUNTER — Ambulatory Visit (HOSPITAL_BASED_OUTPATIENT_CLINIC_OR_DEPARTMENT_OTHER): Payer: Medicare Other | Admitting: Lab

## 2013-08-19 ENCOUNTER — Encounter (HOSPITAL_COMMUNITY): Payer: Self-pay

## 2013-08-19 ENCOUNTER — Telehealth: Payer: Self-pay | Admitting: Oncology

## 2013-08-19 ENCOUNTER — Telehealth: Payer: Self-pay | Admitting: *Deleted

## 2013-08-19 ENCOUNTER — Encounter (HOSPITAL_COMMUNITY)
Admission: RE | Admit: 2013-08-19 | Discharge: 2013-08-19 | Disposition: A | Discharge status: Home / Self Care | Payer: Medicare Other | Source: Ambulatory Visit | Attending: Radiation Oncology | Admitting: Radiation Oncology

## 2013-08-19 DIAGNOSIS — C159 Malignant neoplasm of esophagus, unspecified: Secondary | ICD-10-CM

## 2013-08-19 DIAGNOSIS — E876 Hypokalemia: Secondary | ICD-10-CM

## 2013-08-19 LAB — MAGNESIUM (CC13): Magnesium: 2.1 mg/dl (ref 1.5–2.5)

## 2013-08-19 LAB — BASIC METABOLIC PANEL (CC13)
Anion Gap: 11 mEq/L (ref 3–11)
BUN: 5.9 mg/dL — ABNORMAL LOW (ref 7.0–26.0)
CO2: 28 mEq/L (ref 22–29)
Chloride: 101 mEq/L (ref 98–109)
Creatinine: 0.7 mg/dL (ref 0.6–1.1)
Glucose: 75 mg/dl (ref 70–140)
Potassium: 3.9 mEq/L (ref 3.5–5.1)

## 2013-08-19 MED ORDER — FLUDEOXYGLUCOSE F - 18 (FDG) INJECTION
16.5000 | Freq: Once | INTRAVENOUS | Status: AC | PRN
  Administered 2013-08-19: 16.5 via INTRAVENOUS

## 2013-08-19 NOTE — Telephone Encounter (Signed)
lmonvm for pt re appt for ched 10/16 @ 10am. appt r/s from 10/13 per [place] - pt FTKA. Other appts remain the same.

## 2013-08-19 NOTE — Telephone Encounter (Signed)
No additional note

## 2013-08-19 NOTE — Telephone Encounter (Signed)
Called patient to discuss need and schedule lab draw for BMET and magnesium today.  Reports it's better for her to have lab after PET scan due to getting children to and from school.  Will send request for her to come to lab between 3:15 pm and 3:45 pm.  She will come to [facility] lab after today's PET scan.

## 2013-08-20 ENCOUNTER — Telehealth: Payer: Self-pay

## 2013-08-20 ENCOUNTER — Other Ambulatory Visit: Payer: Self-pay | Admitting: Radiology

## 2013-08-20 ENCOUNTER — Encounter (HOSPITAL_COMMUNITY): Payer: Self-pay | Admitting: Pharmacy Technician

## 2013-08-20 ENCOUNTER — Encounter: Payer: Self-pay | Admitting: Radiation Oncology

## 2013-08-20 ENCOUNTER — Telehealth: Payer: Self-pay | Admitting: *Deleted

## 2013-08-20 NOTE — Addendum Note (Signed)
Encounter addended by: Lonie Peak, MD on: 08/20/2013  5:47 PM<BR>     Documentation filed: Notes Section

## 2013-08-20 NOTE — Telephone Encounter (Signed)
Spoke with Ms. Broder and told her the lab results as noted below.  She stated that she has decreased the potassium tabs already to 20 meq daily. Reminded her of the education class in the am at 1000.  Told her the Portacath placement was scheduled on 08-22-13.  Told her there may be a message in her voice mail regarding the procedure as her vm is full.  This nurse tried to reach her and could not leave a message earlier today. Told Ms. Pare that the Nurse Educator could print her schedule as she misplaced the one given at her visit the other day.

## 2013-08-20 NOTE — Telephone Encounter (Signed)
Message copied by Lorine Bears on Wed Aug 20, 2013 12:59 PM ------      Message from: Reece Packer      Created: Tue Aug 19, 2013  7:38 PM       Labs seen and need follow up: please let her know K up to 3.9. If she is still taking 40 mEq K daily, she can decrease that to 20 mEq daily      Cc LA, TH ------

## 2013-08-20 NOTE — Telephone Encounter (Signed)
Pt vm is not accepting messages. im hoping the pt is made aware of her lab appt when she comes in for class....td

## 2013-08-20 NOTE — Progress Notes (Signed)
I called [name] and talked with her about her PET scan results.  We talked about the metastatic nature of her disease and how this changes her treatment plan. I will focus on treating the esophageal tumor for palliation. She understands her cancer is treatable but unfortunately not curable. She will received chemotherapy either during or immediately after RT. Surgery is no longer a  recommended option.  We will expedite her RT to start it on Monday. Anticipate 2 weeks of RT.  Dr. Darrold Span will see her on Monday as well.  Ms. Barretta took the news relatively well.  -----------------------------------  Lonie Peak, MD

## 2013-08-21 ENCOUNTER — Encounter: Payer: Self-pay | Admitting: *Deleted

## 2013-08-21 ENCOUNTER — Other Ambulatory Visit: Payer: Medicare Other | Admitting: Lab

## 2013-08-21 ENCOUNTER — Other Ambulatory Visit: Payer: Self-pay | Admitting: Radiology

## 2013-08-21 ENCOUNTER — Other Ambulatory Visit: Payer: Medicare Other

## 2013-08-21 NOTE — Progress Notes (Signed)
[facility] Psychosocial Distress Screening Clinical Social Work  Clinical Social Work was referred by distress screening protocol.  The patient scored a 8 on the Psychosocial Distress Thermometer which indicates severe distress. Clinical Social Worker left vm to assess for distress and other psychosocial needs. CSW awaits return call and will attempt to see at future appts.    Clinical Social Worker follow up needed: yes  Doreen Salvage, LCSW Clinical Social Worker [facility] Wednesday, Thursday and Friday Phone: [phone] Fax: [phone]

## 2013-08-21 NOTE — Progress Notes (Signed)
Patient a no show the 2nd time for chemo class.  Dr. Precious Reel nurse, Sallye Ober, aware.

## 2013-08-22 ENCOUNTER — Encounter (HOSPITAL_COMMUNITY): Payer: Self-pay

## 2013-08-22 ENCOUNTER — Ambulatory Visit (HOSPITAL_COMMUNITY)
Admission: RE | Admit: 2013-08-22 | Discharge: 2013-08-22 | Disposition: A | Discharge status: Home / Self Care | Payer: Medicare Other | Source: Ambulatory Visit | Attending: Oncology | Admitting: Oncology

## 2013-08-22 ENCOUNTER — Telehealth: Payer: Self-pay | Admitting: *Deleted

## 2013-08-22 ENCOUNTER — Other Ambulatory Visit: Payer: Self-pay | Admitting: Oncology

## 2013-08-22 DIAGNOSIS — G35 Multiple sclerosis: Secondary | ICD-10-CM | POA: Insufficient documentation

## 2013-08-22 DIAGNOSIS — C159 Malignant neoplasm of esophagus, unspecified: Secondary | ICD-10-CM | POA: Insufficient documentation

## 2013-08-22 DIAGNOSIS — E039 Hypothyroidism, unspecified: Secondary | ICD-10-CM | POA: Insufficient documentation

## 2013-08-22 MED ORDER — MIDAZOLAM HCL 2 MG/2ML IJ SOLN
INTRAMUSCULAR | Status: AC
  Filled 2013-08-22: qty 4

## 2013-08-22 MED ORDER — FENTANYL CITRATE 0.05 MG/ML IJ SOLN
INTRAMUSCULAR | Status: AC | PRN
  Administered 2013-08-22: 100 ug via INTRAVENOUS
  Administered 2013-08-22: 50 ug via INTRAVENOUS

## 2013-08-22 MED ORDER — MIDAZOLAM HCL 2 MG/2ML IJ SOLN
INTRAMUSCULAR | Status: AC | PRN
  Administered 2013-08-22: 2 mg via INTRAVENOUS
  Administered 2013-08-22 (×2): 1 mg via INTRAVENOUS

## 2013-08-22 MED ORDER — FENTANYL CITRATE 0.05 MG/ML IJ SOLN
INTRAMUSCULAR | Status: DC
Start: 2013-08-22 — End: 2013-08-25
  Filled 2013-08-22: qty 4

## 2013-08-22 MED ORDER — SODIUM CHLORIDE 0.9 % IV SOLN
INTRAVENOUS | Status: DC
  Administered 2013-08-22: 11:00:00 via INTRAVENOUS

## 2013-08-22 MED ORDER — CEFAZOLIN SODIUM-DEXTROSE 2-3 GM-% IV SOLR
2.0000 g | INTRAVENOUS | Status: AC
  Administered 2013-08-22: 2 g via INTRAVENOUS

## 2013-08-22 MED ORDER — CEFAZOLIN SODIUM-DEXTROSE 2-3 GM-% IV SOLR
INTRAVENOUS | Status: AC
  Filled 2013-08-22: qty 50

## 2013-08-22 MED ORDER — ONDANSETRON HCL 4 MG/2ML IJ SOLN
INTRAMUSCULAR | Status: AC | PRN
  Administered 2013-08-22: 4 mg via INTRAVENOUS

## 2013-08-22 MED ORDER — ONDANSETRON HCL 4 MG/2ML IJ SOLN
INTRAMUSCULAR | Status: AC
  Filled 2013-08-22: qty 2

## 2013-08-22 MED ORDER — HEPARIN SOD (PORK) LOCK FLUSH 100 UNIT/ML IV SOLN
500.0000 [IU] | Freq: Once | INTRAVENOUS | Status: AC
  Administered 2013-08-22: 500 [IU] via INTRAVENOUS

## 2013-08-22 NOTE — H&P (Signed)
Chief Complaint: "I'm here for a port" Referring Physician:Livesay HPI: [name] is an 47 y.o. female with esophageal cancer. She is set to start both radiation therapy and chemo. She is scheduled today for port placement. PMHx and meds reviewed. Feels well, no recent illness, fevers, chills.  Past Medical History:  Past Medical History  Diagnosis Date  . Multiple sclerosis   . Irritable bowel syndrome   . Arthritis   . Pancreatitis   . Anxiety   . Depression   . Chronic pain   . Migraine   . PTSD (post-traumatic stress disorder)   . Methadone use   . PONV (postoperative nausea and vomiting)   . 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 Grave's disease   . hypothyroidism     Past Surgical History:  Past Surgical History  Procedure Laterality Date  . Sphincterotomy    . Cholecystectomy    . Eus N/A 08/08/2013    Procedure: ESOPHAGEAL ENDOSCOPIC ULTRASOUND (EUS) RADIAL;  Surgeon: Theda Belfast, MD;  Location: [facility];  Service: Endoscopy;  Laterality: N/A;  . Biopsy of esophagus  07/24/13    Poorly differentiatied Adenocarcinoma of the Lower Third of the Esophagus    Family History:  Family History  Problem Relation Age of Onset  . Cancer Mother     ovarian, colon  . Hypertension Father     Social History:  reports that she has been smoking.  She has never used smokeless tobacco. She reports that she does not drink alcohol or use illicit drugs.  Allergies:  Allergies  Allergen Reactions  . Erythromycin Nausea And Vomiting  . Morphine And Related Other (See Comments)    whelps on arms at iv site    Medications:   Medication List    ASK your doctor about these medications       albuterol 108 (90 BASE) MCG/ACT inhaler  Commonly known as:  PROVENTIL HFA;VENTOLIN HFA  Inhale 2 puffs into the lungs every 4 (four) hours as needed for wheezing or shortness of breath.     albuterol (2.5 MG/3ML) 0.083% nebulizer solution  Commonly known  as:  PROVENTIL  Take 2.5 mg by nebulization every 4 (four) hours as needed for shortness of breath.     busPIRone 15 MG tablet  Commonly known as:  BUSPAR  Take 15 mg by mouth 2 (two) times daily.     cyclobenzaprine 10 MG tablet  Commonly known as:  FLEXERIL  Take 10 mg by mouth 3 (three) times daily as needed. For muscle spasms.     diclofenac sodium 1 % Gel  Commonly known as:  VOLTAREN  Apply 1 application topically 2 (two) times daily as needed (Applies to knees, hips and neck for pain.).     escitalopram 10 MG tablet  Commonly known as:  LEXAPRO  Take 30 mg by mouth daily after supper.     esomeprazole 20 MG capsule  Commonly known as:  NEXIUM  Take 20 mg by mouth daily before breakfast.     imipramine 50 MG tablet  Commonly known as:  TOFRANIL  Take 100 mg by mouth at bedtime.     levothyroxine 100 MCG tablet  Commonly known as:  SYNTHROID, LEVOTHROID  Take 100 mcg by mouth daily before breakfast.     Melatonin 3 MG Tabs  Take 3-6 mg by mouth at bedtime as needed (For sleep.).     methadone 10 MG/5ML solution  Commonly known as:  DOLOPHINE  Take 94 mg by mouth every morning.     multivitamin with minerals Tabs tablet  Take 1 tablet by mouth at bedtime.     nicotine 14 mg/24hr patch  Commonly known as:  NICODERM CQ - dosed in mg/24 hours  Place 1 patch onto the skin daily.     ondansetron 8 MG tablet  Commonly known as:  ZOFRAN  Take 8 mg by mouth every 8 (eight) hours as needed for nausea.     oxyCODONE-acetaminophen 5-325 MG per tablet  Commonly known as:  ROXICET  Take 1 tablet by mouth every 4 (four) hours as needed for pain.     potassium chloride 10 MEQ tablet  Commonly known as:  K-DUR,KLOR-CON  Take 20 mEq by mouth at bedtime.        Please HPI for pertinent positives, otherwise complete 10 system ROS negative.  Physical Exam: BP 135/75  Pulse 87  Temp(Src) 97.4 F (36.3 C) (Oral)  Resp 16  SpO2 97%  LMP 04/28/2011 There is no weight  on file to calculate BMI.   General Appearance:  Alert, cooperative, no distress, appears stated age  Head:  Normocephalic, without obvious abnormality, atraumatic  ENT: Unremarkable  Neck: Supple, symmetrical, trachea midline  Lungs:   Clear to auscultation bilaterally, no w/r/r,   Chest Wall:  No tenderness or deformity  Heart:  Regular rate and rhythm, S1, S2 normal, no murmur, rub or gallop.  Neurologic: Normal affect, no gross deficits.   No results found for this or any previous visit (from the past 48 hour(s)). No results found.  Assessment/Plan Esophageal cancer Discussed port placement. Explained procedure, risks, complications, use of sedation. Labs from 120/8 reviewed, no anticoagulation use. Consent signed in chart  Brayton El PA-C 08/22/2013, 10:18 AM

## 2013-08-22 NOTE — Telephone Encounter (Signed)
Lm gv appt for NUT on 08/27/13@9am ...td

## 2013-08-22 NOTE — Procedures (Signed)
Placement of right jugular port.  Tip in SVC and ready to use.  No immediate complication.

## 2013-08-24 ENCOUNTER — Other Ambulatory Visit: Payer: Self-pay | Admitting: Oncology

## 2013-08-25 ENCOUNTER — Ambulatory Visit
Admission: RE | Admit: 2013-08-25 | Discharge: 2013-08-25 | Disposition: A | Discharge status: Home / Self Care | Payer: Medicare Other | Source: Ambulatory Visit | Attending: Radiation Oncology | Admitting: Radiation Oncology

## 2013-08-25 ENCOUNTER — Ambulatory Visit (HOSPITAL_BASED_OUTPATIENT_CLINIC_OR_DEPARTMENT_OTHER): Payer: Medicare Other | Admitting: Oncology

## 2013-08-25 ENCOUNTER — Encounter: Payer: Self-pay | Admitting: Radiation Oncology

## 2013-08-25 ENCOUNTER — Encounter: Payer: Self-pay | Admitting: Oncology

## 2013-08-25 ENCOUNTER — Ambulatory Visit: Payer: Medicare Other | Admitting: Radiation Oncology

## 2013-08-25 ENCOUNTER — Telehealth: Payer: Self-pay | Admitting: *Deleted

## 2013-08-25 ENCOUNTER — Other Ambulatory Visit (HOSPITAL_BASED_OUTPATIENT_CLINIC_OR_DEPARTMENT_OTHER): Payer: Medicare Other | Admitting: Lab

## 2013-08-25 VITALS — BP 103/68 | HR 99 | Temp 98.0°F | Resp 18 | Ht 63.0 in | Wt 111.8 lb

## 2013-08-25 VITALS — BP 114/79 | HR 91 | Temp 98.2°F | Resp 20 | Wt 112.2 lb

## 2013-08-25 DIAGNOSIS — C155 Malignant neoplasm of lower third of esophagus: Secondary | ICD-10-CM

## 2013-08-25 DIAGNOSIS — C7951 Secondary malignant neoplasm of bone: Secondary | ICD-10-CM

## 2013-08-25 DIAGNOSIS — E876 Hypokalemia: Secondary | ICD-10-CM

## 2013-08-25 DIAGNOSIS — G35 Multiple sclerosis: Secondary | ICD-10-CM

## 2013-08-25 DIAGNOSIS — C159 Malignant neoplasm of esophagus, unspecified: Secondary | ICD-10-CM

## 2013-08-25 DIAGNOSIS — I4581 Long QT syndrome: Secondary | ICD-10-CM

## 2013-08-25 LAB — CBC WITH DIFFERENTIAL/PLATELET
Basophils Absolute: 0 10*3/uL (ref 0.0–0.1)
EOS%: 1.5 % (ref 0.0–7.0)
Eosinophils Absolute: 0.1 10*3/uL (ref 0.0–0.5)
HCT: 43.7 % (ref 34.8–46.6)
HGB: 14.7 g/dL (ref 11.6–15.9)
LYMPH%: 44 % (ref 14.0–49.7)
MCH: 31.7 pg (ref 25.1–34.0)
MCV: 94.4 fL (ref 79.5–101.0)
MONO%: 5.5 % (ref 0.0–14.0)
NEUT%: 48.6 % (ref 38.4–76.8)
Platelets: 228 10*3/uL (ref 145–400)
nRBC: 0 % (ref 0–0)

## 2013-08-25 LAB — COMPREHENSIVE METABOLIC PANEL (CC13)
ALT: 11 U/L (ref 0–55)
Albumin: 2.8 g/dL — ABNORMAL LOW (ref 3.5–5.0)
Alkaline Phosphatase: 174 U/L — ABNORMAL HIGH (ref 40–150)
Anion Gap: 12 mEq/L — ABNORMAL HIGH (ref 3–11)
BUN: 5.9 mg/dL — ABNORMAL LOW (ref 7.0–26.0)
CO2: 29 mEq/L (ref 22–29)
Glucose: 94 mg/dl (ref 70–140)
Total Bilirubin: 0.47 mg/dL (ref 0.20–1.20)
Total Protein: 6.8 g/dL (ref 6.4–8.3)

## 2013-08-25 LAB — MAGNESIUM (CC13): Magnesium: 1.9 mg/dl (ref 1.5–2.5)

## 2013-08-25 NOTE — Progress Notes (Signed)
Simulation Verification Note Outpatient  The patient was brought to the treatment unit and placed in the planned treatment position. The clinical setup was verified. Then port films were obtained and uploaded to the radiation oncology medical record software.  The treatment beams were carefully compared against the planned radiation fields. The position location and shape of the radiation fields was reviewed. They targeted volume of tissue appears to be appropriately covered by the radiation beams. Organs at risk appear to be excluded as planned. However, her spine was slightly rotated and I requested reports tomorrow with better spine alignment.  The patient's treatment will proceed as planned.  -----------------------------------  Lonie Peak, MD

## 2013-08-25 NOTE — Progress Notes (Signed)
   Weekly Management Note:  Outpatient - treating GEJ tumor and T9-11. Current Dose:  3Gy  Projected Dose: 30 Gy   Narrative:  The patient presents for routine under treatment assessment.  CBCT/MVCT images/Port film x-rays were reviewed.  The chart was checked. She acknowledges b/l back pain in flank regions. Nausea is alleviated with Zofran. Needs to eat slowly. Fatigued and weak.  Physical Findings:  weight is 112 lb 3.2 oz (50.894 kg). Her oral temperature is 98.2 F (36.8 C). Her blood pressure is 114/79 and her pulse is 91. Her respiration is 20.  NAD  CBC    Component Value Date/Time   WBC 6.8 08/25/2013 1417   WBC 9.9 07/30/2013 0200   RBC 4.63 08/25/2013 1417   RBC 3.87 07/30/2013 0200   HGB 14.7 08/25/2013 1417   HGB 12.4 07/30/2013 0200   HCT 43.7 08/25/2013 1417   HCT 34.7* 07/30/2013 0200   PLT 228 08/25/2013 1417   PLT 263 07/30/2013 0200   MCV 94.4 08/25/2013 1417   MCV 89.7 07/30/2013 0200   MCH 31.7 08/25/2013 1417   MCH 32.0 07/30/2013 0200   MCHC 33.6 08/25/2013 1417   MCHC 35.7 07/30/2013 0200   RDW 13.7 08/25/2013 1417   RDW 12.6 07/30/2013 0200   LYMPHSABS 3.0 08/25/2013 1417   LYMPHSABS 2.0 07/30/2013 0200   MONOABS 0.4 08/25/2013 1417   MONOABS 0.6 07/30/2013 0200   EOSABS 0.1 08/25/2013 1417   EOSABS 0.0 07/30/2013 0200   BASOSABS 0.0 08/25/2013 1417   BASOSABS 0.0 07/30/2013 0200     CMP     Component Value Date/Time   NA 141 08/25/2013 1418   NA 133* 07/31/2013 1000   K 3.4* 08/25/2013 1418   K 3.9 07/31/2013 1000   CL 99 07/31/2013 1000   CO2 29 08/25/2013 1418   CO2 25 07/31/2013 1000   GLUCOSE 94 08/25/2013 1418   GLUCOSE 74 07/31/2013 1000   BUN 5.9* 08/25/2013 1418   BUN 4* 07/31/2013 1000   CREATININE 0.7 08/25/2013 1418   CREATININE 0.45* 07/31/2013 1000   CALCIUM 9.3 08/25/2013 1418   CALCIUM 8.7 07/31/2013 1000   PROT 6.8 08/25/2013 1418   PROT 6.3 07/30/2013 0200   ALBUMIN 2.8* 08/25/2013 1418   ALBUMIN 2.6* 07/30/2013 0200   AST 21  08/25/2013 1418   AST 37 07/30/2013 0200   ALT 11 08/25/2013 1418   ALT 18 07/30/2013 0200   ALKPHOS 174* 08/25/2013 1418   ALKPHOS 163* 07/30/2013 0200   BILITOT 0.47 08/25/2013 1418   BILITOT 0.6 07/30/2013 0200   GFRNONAA >90 07/31/2013 1000   GFRAA >90 07/31/2013 1000     Impression:  The patient is tolerating radiotherapy.   Plan:  Continue radiotherapy as planned. We spoke generally about prognosis, without specific numbers. She understands her cancer is not curable and is open to seeing a Child psychotherapist to discuss will, POA, etc.  Would like to see someone in a couple weeks to let everything "settle" first. I will make a referral.  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 Hopefully this will help her back pain, if it is from the metastases.  -----------------------------------  Lonie Peak, MD

## 2013-08-25 NOTE — Progress Notes (Signed)
Pt c/o pain in her lower back and sternum. She states she has n/v every time she eats unless she eats very slowly. Pt takes Zofran twice daily w/good relief. She is drinking Ensure x 1 a day, states she will try to increase this amount. Pt c/o fatigue, weakness today. She is to see Dr Darrold Span after seeing Dr Basilio Cairo today.

## 2013-08-25 NOTE — Telephone Encounter (Signed)
appts made and printed. Pt is aware that tx will be added and that i will call her w/ a d/t. i emailed MW to add the tx's...td

## 2013-08-25 NOTE — Progress Notes (Signed)
OFFICE PROGRESS NOTE   08/25/2013   Physicians:Patrick Felix Ahmadi (PCP), Bindubal Balan, Trudie Buckler ([place], neurology), Reddy/ Dartha Lodge NP ([facility]), K.Hilty (cardiology)   INTERVAL HISTORY:  Patient is seen, alone for visit, in continuing attention to esophageal adenocarcinoma which unfortunately has been shown metastatic by PET done 08-19-13. She is worked in for visit now after she was double booked with radiation oncology earlier. I discussed situation with Dr Basilio Cairo after results of the PET were available; plan is for short course palliative RT to esophagus in hopes of improving swallowing. She is "swallowing slowly", liquids and medications. She has been drinking one Ensure daily and will try to increase this. Bowels are moving. She continues methadone from [facility] and has oxycodone 5-325 from Dr.Hung; she complains of pain in mid back which may be related to the primary tumor rather than the CT occult bone mets identified at T7, T11 and possibly T9and T10 by PET.   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 Per [employer], [employer] protocol (epirubicin, CDDP, xeloda, rilotumumab) is no longer available here. Following patient's visit, I discussed with my partner in GI oncology, who would consider FOLFOX after RT. She has not yet attended chemotherapy education class, having missed 2 appointments for this.  She had PAC placed by IR on 08-22-13.   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 Last upper endoscopy prior to recent evaluation was 3 years ago and reportedly was normal. More recently she has developed difficulty swallowing and  different pain, which extends from epigastrium to LLQ. She had EGD by Dr [name] in [place] which found a stenosed area in distal esophagus which was friable, with abnormal mucosa there and in gastric lumen. Pathology from [facility] reportedly showed adenocarcinoma; that report is not in records available to me now and has been requested. She was hospitalized in [facility] system from 9-23 thru 08-01-13 with pneumonia, with SOB, hypoxemia and dizziness, also hypokalemic then.Blood and sputum cultures had no growth other than few candida in the sputum. She had CT CAP in hospital on 07-31-13, with extensive ground glass opacities in lungs with diffuse peribronchial thickening, esophageal dilatation with confluent soft tissue density involving distal esophagus and GE junction/ adjacent stomach, enlarged AP window node and prominent subcarinal node, mildly enlarged nodes retroperitoneum and root of mesentery, nodal mass 5.4 x 2.7 cm encasing celiac axis and proximal superior mesenteric artery, diffuse hepatic steatosis and bilateral adrenal hyperplasia. She also had endoscopic ultrasound by Dr [name] on 08-08-13, with stenosing and friable mass from 40 cm extending 5 cm into proximal stomach with maximal depth of invasion 28mm, extending beyond the adventitia; two large lymph nodes were identified, measuring 10mm, however celiac axis area could not be evaluated. Dr Haywood Pao impression at that procedure was of T4N2 distal esophageal/ proximal gastric tumor. MRI head 08-04-13 was unremarkable.   Review of systems as above, also: No fever or symptoms of infection. No new or different neurologic symptoms, underlying MS. No bleeding. No increased cough or SOB. Remainder of 10 point Review of Systems negative.  Objective:  Vital signs in last 24 hours:  BP 103/68  Pulse 99  Temp(Src) 98 F (36.7 C) (Oral)  Resp  18  Ht 5\' 3"  (1.6 m)  Wt 111 lb 12.8 oz (50.712 kg)  BMI 19.81 kg/m2  LMP 04/28/2011 Weight is  down another 8 lbs from 08-13-13.   Alert, oriented and appropriate. Ambulatory without assistance. Looks mildly uncomfortable but NAD.   HEENT:PERRL, sclerae not icteric. Oral mucosa moist without lesions, posterior pharynx clear.  Neck supple. No JVD.  Lymphatics:no cervical,suraclavicular adenopathy Resp: diminished breath sounds thruout otherwise clear to auscultation and  percussion bilaterally Cardio: tachy, regular rate and rhythm. No gallop. GI: soft, nontender, not distended, no mass or organomegaly. Normally active bowel sounds. Surgical incision not remarkable. Musculoskeletal/ Extremities: without pitting edema, cords, tenderness. Spine not tender to palpation Neuro:  nonfocal Skin without rash, ecchymosis, petechiae Portacath-good position, resolving bruises from placement, surgical glue in place  Lab Results:  Results for orders placed in visit on 08/25/13  CBC WITH DIFFERENTIAL      Result Value Range   WBC 6.8  3.9 - 10.3 10e3/uL   NEUT# 3.3  1.5 - 6.5 10e3/uL   HGB 14.7  11.6 - 15.9 g/dL   HCT 78.2  95.6 - 21.3 %   Platelets 228  145 - 400 10e3/uL   MCV 94.4  79.5 - 101.0 fL   MCH 31.7  25.1 - 34.0 pg   MCHC 33.6  31.5 - 36.0 g/dL   RBC 0.86  5.78 - 4.69 10e6/uL   RDW 13.7  11.2 - 14.5 %   lymph# 3.0  0.9 - 3.3 10e3/uL   MONO# 0.4  0.1 - 0.9 10e3/uL   Eosinophils Absolute 0.1  0.0 - 0.5 10e3/uL   Basophils Absolute 0.0  0.0 - 0.1 10e3/uL   NEUT% 48.6  38.4 - 76.8 %   LYMPH% 44.0  14.0 - 49.7 %   MONO% 5.5  0.0 - 14.0 %   EOS% 1.5  0.0 - 7.0 %   BASO% 0.4  0.0 - 2.0 %   nRBC 0  0 - 0 %  COMPREHENSIVE METABOLIC PANEL (CC13)      Result Value Range   Sodium 141  136 - 145 mEq/L   Potassium 3.4 (*) 3.5 - 5.1 mEq/L   Chloride 100  98 - 109 mEq/L   CO2 29  22 - 29 mEq/L   Glucose 94  70 - 140 mg/dl   BUN 5.9 (*) 7.0 - 62.9 mg/dL   Creatinine 0.7  0.6 - 1.1 mg/dL   Total Bilirubin 5.28  0.20 - 1.20 mg/dL   Alkaline Phosphatase 174 (*) 40 - 150 U/L   AST 21   5 - 34 U/L   ALT 11  0 - 55 U/L   Total Protein 6.8  6.4 - 8.3 g/dL   Albumin 2.8 (*) 3.5 - 5.0 g/dL   Calcium 9.3  8.4 - 41.3 mg/dL   Anion Gap 12 (*) 3 - 11 mEq/L  MAGNESIUM (CC13)      Result Value Range   Magnesium 1.9  1.5 - 2.5 mg/dl     Studies/Results: NUCLEAR MEDICINE PET SKULL BASE TO THIGH  COMPARISON: 07/31/2013 chest, abdomen, and pelvic CTs  FINDINGS:  NECK  Left low jugular/supraclavicular nodal metastasis. This measures 1.3  cm and a S.U.V. max of 13.3 on image 60/series 2.  CHEST  Distal esophageal/ proximal gastric primary. This measures a S.U.V.  max of 16.1 on image 125/series 2.  ABDOMEN/PELVIS  Extensive nodal metastasis within a gastrohepatic ligament and  surrounding the celiac origin. Index nodal mass measures  2.5 cm and  a S.U.V. max of 13.5 on image 139.  Hypermetabolic adenopathy continues along the left periaortic  station of the retroperitoneum.  Ascending colonic hypermetabolism is favored to be physiologic,  including on image 184/series 2.  SKELETON  Multiple osseous metastasis. Lesions are identified within T11 and  left-sided T7. Possible T9 and T10 lesions. These are relatively CT  occult, without gross epidural tumor or canal encroachment.  CT IMAGES PERFORMED FOR ATTENUATION CORRECTION  No further findings within the neck.  Chest, abdomen, and pelvic findings deferred to recent diagnostic  CTs. The gastroesophageal junction primary is likely partially  obstructive. Fluid and contrast are seen within the lower thoracic  esophagus which is mildly dilated. The prevascular nodes described  on prior exam are decreased in size and not hypermetabolic.  Cholecystectomy. Minimal pneumobilia. Hepatic steatosis.  IMPRESSION:  1. Gastroesophageal junction primary with upper abdominal and low  cervical nodal metastasis.  2. Osseous metastasis.   Medications: I have reviewed the patient's current medications and will try to reach [facility] to coordinate management of pain medication. Patient is in agreement with beginning zometa later this week and will be given additional IVF with that infusion.  DISCUSSION: Patient understands information about metastatic disease seen on PET, including fact that this is not a situation of possible cure. She is hopeful that interventions will improve condition even so.  Assessment/Plan:  1. 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 We will plan to start chemo in palliative attempt likely with FOLFOX after RT completes. I will coordinate pain management with [facility]. SHe needs evaluation by [facility] dietician. Begin zometa with extra IVF this week. 2 chronic pancreatitis since cholecystectomy 1993, better in past 2 years post sphincterotomy and stents  3. On methadone maintenance for drug history  4.long tobacco just DCd  5. PAC placed for chemotherapy  6.post treatment of Graves disease, now on replacement for treatment induced hypothyroidism  7.hx migraines but tolerates zofran  8.flu vaccine done  9.multiple sclerosis not symptomatic presently  10.long QT syndrome  11.hypokalemia: etiology not clear but also required treatment in hospital. WIll check magnesium with next labs. Interventions as above.  12. Interstitial pneumonia treated during hospitalization in Sept.        Reece Packer, MD   08/25/2013, 4:58 PM

## 2013-08-26 ENCOUNTER — Telehealth: Payer: Self-pay | Admitting: *Deleted

## 2013-08-26 ENCOUNTER — Ambulatory Visit
Admission: RE | Admit: 2013-08-26 | Discharge: 2013-08-26 | Disposition: A | Discharge status: Home / Self Care | Payer: Medicare Other | Source: Ambulatory Visit | Attending: Radiation Oncology | Admitting: Radiation Oncology

## 2013-08-26 DIAGNOSIS — C7951 Secondary malignant neoplasm of bone: Secondary | ICD-10-CM | POA: Insufficient documentation

## 2013-08-26 NOTE — Telephone Encounter (Signed)
Lm gv appt arrival time for tx on 08/28/13 @ 11:30am...td

## 2013-08-26 NOTE — Telephone Encounter (Signed)
No additional note

## 2013-08-26 NOTE — Telephone Encounter (Signed)
Per staff message and POF I have scheduled appts.  JMW  

## 2013-08-27 ENCOUNTER — Encounter: Payer: Medicare Other | Admitting: Nutrition

## 2013-08-27 ENCOUNTER — Ambulatory Visit
Admission: RE | Admit: 2013-08-27 | Discharge: 2013-08-27 | Disposition: A | Discharge status: Home / Self Care | Payer: Medicare Other | Source: Ambulatory Visit | Attending: Radiation Oncology | Admitting: Radiation Oncology

## 2013-08-27 ENCOUNTER — Ambulatory Visit: Payer: Medicare Other | Admitting: Radiation Oncology

## 2013-08-27 ENCOUNTER — Other Ambulatory Visit: Payer: Self-pay

## 2013-08-27 ENCOUNTER — Telehealth: Payer: Self-pay | Admitting: Oncology

## 2013-08-27 MED ORDER — OXYCODONE-ACETAMINOPHEN 10-325 MG PO TABS: ORAL_TABLET | ORAL | Status: DC

## 2013-08-27 NOTE — Telephone Encounter (Signed)
Medical Oncology  Called [facility] to discuss pain management, as she is on maintenance methadone for drug history. Routed thru main phone line to [facility] at [phone] and LM with my pager as preferred # and phone for my RN. MD needs to speak with this pharmacy if call is returned.  Ila Mcgill, MD

## 2013-08-27 NOTE — Progress Notes (Signed)
[name] called stating that her pain in her lower back is a 7/10.  It decreases to a 5-6/10 with Ibuprofen 400 mg every 6-8 hours.  Dr. Darrold Span prescribed Oxycodone 10/325 mg. Tried to reach patient at [phone].  No answer.  Will try again tomorrow morning.

## 2013-08-28 ENCOUNTER — Other Ambulatory Visit (HOSPITAL_COMMUNITY): Payer: Medicare Other

## 2013-08-28 ENCOUNTER — Ambulatory Visit: Payer: Medicare Other

## 2013-08-28 ENCOUNTER — Ambulatory Visit (HOSPITAL_BASED_OUTPATIENT_CLINIC_OR_DEPARTMENT_OTHER): Payer: Medicare Other

## 2013-08-28 ENCOUNTER — Ambulatory Visit
Admission: RE | Admit: 2013-08-28 | Discharge: 2013-08-28 | Disposition: A | Discharge status: Home / Self Care | Payer: Medicare Other | Source: Ambulatory Visit | Attending: Radiation Oncology | Admitting: Radiation Oncology

## 2013-08-28 ENCOUNTER — Other Ambulatory Visit: Payer: Self-pay | Admitting: *Deleted

## 2013-08-28 ENCOUNTER — Telehealth: Payer: Self-pay

## 2013-08-28 ENCOUNTER — Encounter (INDEPENDENT_AMBULATORY_CARE_PROVIDER_SITE_OTHER): Payer: Self-pay

## 2013-08-28 DIAGNOSIS — C7951 Secondary malignant neoplasm of bone: Secondary | ICD-10-CM

## 2013-08-28 DIAGNOSIS — E876 Hypokalemia: Secondary | ICD-10-CM

## 2013-08-28 DIAGNOSIS — C159 Malignant neoplasm of esophagus, unspecified: Secondary | ICD-10-CM

## 2013-08-28 MED ORDER — HEPARIN SOD (PORK) LOCK FLUSH 100 UNIT/ML IV SOLN
500.0000 [IU] | Freq: Once | INTRAVENOUS | Status: AC
  Administered 2013-08-28: 500 [IU] via INTRAVENOUS
  Filled 2013-08-28: qty 5

## 2013-08-28 MED ORDER — SODIUM CHLORIDE 0.9 % IJ SOLN
10.0000 mL | INTRAMUSCULAR | Status: DC | PRN
  Administered 2013-08-28: 10 mL via INTRAVENOUS
  Filled 2013-08-28: qty 10

## 2013-08-28 MED ORDER — ZOLEDRONIC ACID 4 MG/5ML IV CONC
4.0000 mg | Freq: Once | INTRAVENOUS | Status: AC
  Administered 2013-08-28: 4 mg via INTRAVENOUS
  Filled 2013-08-28: qty 5

## 2013-08-28 MED ORDER — SODIUM CHLORIDE 0.9 % IV SOLN
INTRAVENOUS | Status: DC
  Administered 2013-08-28: 12:00:00 via INTRAVENOUS
  Filled 2013-08-28: qty 1000

## 2013-08-28 NOTE — Progress Notes (Signed)
[name] came this morning for IVF and Zometa.  Gave her prescription for Oxycodone 10-325mg .

## 2013-08-28 NOTE — Patient Instructions (Signed)
Zoledronic Acid injection (Hypercalcemia, Oncology) What is this medicine? ZOLEDRONIC ACID (ZOE le dron ik AS id) lowers the amount of calcium loss from bone. It is used to treat too much calcium in your blood from cancer. It is also used to prevent complications of cancer that has spread to the bone. This medicine may be used for other purposes; ask your health care provider or pharmacist if you have questions. What should I tell my health care provider before I take this medicine? They need to know if you have any of these conditions: -aspirin-sensitive asthma -dental disease -kidney disease -an unusual or allergic reaction to zoledronic acid, other medicines, foods, dyes, or preservatives -pregnant or trying to get pregnant -breast-feeding How should I use this medicine? This medicine is for infusion into a vein. It is given by a health care professional in a hospital or clinic setting. Talk to your pediatrician regarding the use of this medicine in children. Special care may be needed. Overdosage: If you think you have taken too much of this medicine contact a poison control center or emergency room at once. NOTE: This medicine is only for you. Do not share this medicine with others. What if I miss a dose? It is important not to miss your dose. Call your doctor or health care professional if you are unable to keep an appointment. What may interact with this medicine? -certain antibiotics given by injection -NSAIDs, medicines for pain and inflammation, like ibuprofen or naproxen -some diuretics like bumetanide, furosemide -teriparatide -thalidomide This list may not describe all possible interactions. Give your health care provider a list of all the medicines, herbs, non-prescription drugs, or dietary supplements you use. Also tell them if you smoke, drink alcohol, or use illegal drugs. Some items may interact with your medicine. What should I watch for while using this medicine? Visit  your doctor or health care professional for regular checkups. It may be some time before you see the benefit from this medicine. Do not stop taking your medicine unless your doctor tells you to. Your doctor may order blood tests or other tests to see how you are doing. Women should inform their doctor if they wish to become pregnant or think they might be pregnant. There is a potential for serious side effects to an unborn child. Talk to your health care professional or pharmacist for more information. You should make sure that you get enough calcium and vitamin D while you are taking this medicine. Discuss the foods you eat and the vitamins you take with your health care professional. Some people who take this medicine have severe bone, joint, and/or muscle pain. This medicine may also increase your risk for a broken thigh bone. Tell your doctor right away if you have pain in your upper leg or groin. Tell your doctor if you have any pain that does not go away or that gets worse. What side effects may I notice from receiving this medicine? Side effects that you should report to your doctor or health care professional as soon as possible: -allergic reactions like skin rash, itching or hives, swelling of the face, lips, or tongue -anxiety, confusion, or depression -breathing problems -changes in vision -feeling faint or lightheaded, falls -jaw burning, cramping, pain -muscle cramps, stiffness, or weakness -trouble passing urine or change in the amount of urine Side effects that usually do not require medical attention (report to your doctor or health care professional if they continue or are bothersome): -bone, joint, or muscle pain -  fever -hair loss -irritation at site where injected -loss of appetite -nausea, vomiting -stomach upset -tired This list may not describe all possible side effects. Call your doctor for medical advice about side effects. You may report side effects to FDA at  1-800-FDA-1088. Where should I keep my medicine? This drug is given in a hospital or clinic and will not be stored at home. NOTE: This sheet is a summary. It may not cover all possible information. If you have questions about this medicine, talk to your doctor, pharmacist, or health care provider.  2012, Elsevier/Gold Standard. (04/21/2011 9:06:58 AM)  Hypokalemia Hypokalemia means a low potassium level in the blood.Potassium is an electrolyte that helps regulate the amount of fluid in the body. It also stimulates muscle contraction and maintains a stable acid-base balance.Most of the body's potassium is inside of cells, and only a very small amount is in the blood. Because the amount in the blood is so small, minor changes can have big effects. PREPARATION FOR TEST Testing for potassium requires taking a blood sample taken by needle from a vein in the arm. The skin is cleaned thoroughly before the sample is drawn. There is no other special preparation needed. NORMAL VALUES Potassium levels below 3.5 mEq/L are abnormally low. Levels above 5.1 mEq/L are abnormally high. Ranges for normal findings may vary among different laboratories and hospitals. You should always check with your doctor after having lab work or other tests done to discuss the meaning of your test results and whether your values are considered within normal limits. MEANING OF TEST  Your caregiver will go over the test results with you and discuss the importance and meaning of your results, as well as treatment options and the need for additional tests, if necessary. A potassium level is frequently part of a routine medical exam. It is usually included as part of a whole "panel" of tests for several blood salts (such as Sodium and Chloride). It may be done as part of follow-up when a low potassium level was found in the past or other blood salts are suspected of being out of balance. A low potassium level might be suspected if you  have one or more of the following:  Symptoms of weakness.  Abnormal heart rhythms.  High blood pressure and are taking medication to control this, especially water pills (diuretics).  Kidney disease that can affect your potassium level .  Diabetes requiring the use of insulin. The potassium may fall after taking insulin, especially if the diabetes had been out of control for a while.  A condition requiring the use of cortisone-type medication or certain types of antibiotics.  Vomiting and/or diarrhea for more than a day or two.  A stomach or intestinal condition that may not permit appropriate absorption of potassium.  Fainting episodes.  Mental confusion. OBTAINING TEST RESULTS It is your responsibility to obtain your test results. Ask the lab or department performing the test when and how you will get your results.  Please contact your caregiver directly if you have not received the results within one week. At that time, ask if there is anything different or new you should be doing in relation to the results. TREATMENT Hypokalemia can be treated with potassium supplements taken by mouth and/or adjustments in your current medications. A diet high in potassium is also helpful. Foods with high potassium content are:  Peas, lentils, lima beans, nuts, and dried fruit.  Whole grain and bran cereals and breads.  Fresh fruit,  vegetables (bananas, cantaloupe, grapefruit, oranges, tomatoes, honeydew melons, potatoes).  Orange and tomato juices.  Meats. If potassium supplement has been prescribed for you today or your medications have been adjusted, see your personal caregiver in time02 for a re-check. SEEK MEDICAL CARE IF:  There is a feeling of worsening weakness.  You experience repeated chest palpitations.  You are diabetic and having difficulty keeping your blood sugars in the normal range.  You are experiencing vomiting and/or diarrhea.  You are having difficulty with  any of your regular medications. SEEK IMMEDIATE MEDICAL CARE IF:  You experience chest pain, shortness of breath, or episodes of dizziness.  You have been having vomiting or diarrhea for more than 2 days.  You have a fainting episode. MAKE SURE YOU:   Understand these instructions.  Will watch your condition.  Will get help right away if you are not doing well or get worse. Document Released: 10/23/2005 Document Revised: 01/15/2012 Document Reviewed: 10/03/2008 [facility] Patient Information 2014 [place], [place].

## 2013-08-28 NOTE — Telephone Encounter (Signed)
[name] pharmacist at [facility] called to inform Dr. Darrold Span that [name] received 100 tabs of percocet on 08-11-13 from Dr. [name] with directions of 1 tab tid prn. Told [name] that Dr.Livesay spoke with the physician at the [facility] yesterday and is ok with prescription of percocet 10-325 mg being filled with directions 1/2-1 tab q 4 hrs prn.   [name] will let [name] know when prescription is filled.

## 2013-08-29 ENCOUNTER — Ambulatory Visit
Admission: RE | Admit: 2013-08-29 | Discharge: 2013-08-29 | Disposition: A | Discharge status: Home / Self Care | Payer: Medicare Other | Source: Ambulatory Visit | Attending: Radiation Oncology | Admitting: Radiation Oncology

## 2013-08-29 ENCOUNTER — Ambulatory Visit: Payer: Medicare Other

## 2013-08-30 ENCOUNTER — Other Ambulatory Visit: Payer: Self-pay | Admitting: Oncology

## 2013-08-31 ENCOUNTER — Other Ambulatory Visit: Payer: Self-pay | Admitting: Oncology

## 2013-09-01 ENCOUNTER — Other Ambulatory Visit: Payer: Self-pay

## 2013-09-01 ENCOUNTER — Other Ambulatory Visit: Payer: Self-pay | Admitting: Oncology

## 2013-09-01 ENCOUNTER — Ambulatory Visit
Admission: RE | Admit: 2013-09-01 | Discharge: 2013-09-01 | Disposition: A | Discharge status: Home / Self Care | Payer: Medicare Other | Source: Ambulatory Visit | Attending: Radiation Oncology | Admitting: Radiation Oncology

## 2013-09-01 ENCOUNTER — Telehealth: Payer: Self-pay

## 2013-09-01 ENCOUNTER — Ambulatory Visit: Payer: Medicare Other

## 2013-09-01 VITALS — BP 114/75 | HR 89 | Temp 97.6°F | Ht 63.0 in | Wt 108.4 lb

## 2013-09-01 DIAGNOSIS — C159 Malignant neoplasm of esophagus, unspecified: Secondary | ICD-10-CM

## 2013-09-01 DIAGNOSIS — C155 Malignant neoplasm of lower third of esophagus: Secondary | ICD-10-CM

## 2013-09-01 NOTE — Progress Notes (Signed)
   Weekly Management Note:  outpatient Current Dose:  18 Gy  Projected Dose: 30 Gy   Narrative:  The patient presents for routine under treatment assessment.  CBCT/MVCT images/Port film x-rays were reviewed.  The chart was checked. Feels that esophagus is "opening up a bit".  No esophagitis.  Has had nausea/vomiting.  A Little lightheaded. Pain improved, now taking Oxycodone/acet.  Physical Findings:  height is 5\' 3"  (1.6 m) and weight is 108 lb 6.4 oz (49.17 kg). Her temperature is 97.6 F (36.4 C). Her blood pressure is 114/75 and her pulse is 89.  Note flow sheet - when standing BP is 97/67 and pulse 107.  Heart slightly tachycardic but regular rhythm.  Impression:  The patient is tolerating radiotherapy.  Plan:  Continue radiotherapy as planned. Take Zofran for nausea.  Nausea improving, so try to push PO fluids.  Louise from med/onc spoke with Sonda Rumble - she will have her labs moved to tomorrow and receive IV fluids. Pt knows to go to ED if she feels worse before then.  ________________________________   Lonie Peak, M.D.

## 2013-09-01 NOTE — Progress Notes (Signed)
[name] has received 6 fractions to her esophagus.  She c/o pain as a level 5 in her lower back, especially on left side and "some cramping" in her lower legs..  She is traveling by wheelchair and admits to having fatigue.  She is now on Oxycodone and admits to better relief.  Cautioned about the possibility of constipation and encouraged her to use a stool softener or laxative such as Senokot-S or Miralax.

## 2013-09-01 NOTE — Telephone Encounter (Signed)
Spoke with Ms. Botkin and her mother Steward Drone.  Scheduled for IVF tomorrow 09-02-13 at 1330 and sent POF to Scheduling to have IVF set up for 09-03-13 after visit with Dr. Darrold Span.

## 2013-09-01 NOTE — Telephone Encounter (Signed)
Message copied by Lorine Bears on Mon Sep 01, 2013  6:10 PM ------      Message from: Reece Packer      Created: Sun Aug 31, 2013  8:11 PM       I bet she will need IVF again this week, even 2 or 3 times. RT is at 4:00 daily. Please speak to patient and set up if so, and I'll put orders in whatever days.  Thank you            Cc LA, TH ------

## 2013-09-02 ENCOUNTER — Encounter (INDEPENDENT_AMBULATORY_CARE_PROVIDER_SITE_OTHER): Payer: Self-pay

## 2013-09-02 ENCOUNTER — Ambulatory Visit: Payer: Medicare Other

## 2013-09-02 ENCOUNTER — Ambulatory Visit (HOSPITAL_BASED_OUTPATIENT_CLINIC_OR_DEPARTMENT_OTHER): Payer: Medicare Other | Admitting: Lab

## 2013-09-02 ENCOUNTER — Telehealth: Payer: Self-pay | Admitting: Oncology

## 2013-09-02 ENCOUNTER — Ambulatory Visit (HOSPITAL_BASED_OUTPATIENT_CLINIC_OR_DEPARTMENT_OTHER): Payer: Medicare Other

## 2013-09-02 ENCOUNTER — Ambulatory Visit
Admission: RE | Admit: 2013-09-02 | Discharge: 2013-09-02 | Disposition: A | Discharge status: Home / Self Care | Payer: Medicare Other | Source: Ambulatory Visit | Attending: Radiation Oncology | Admitting: Radiation Oncology

## 2013-09-02 VITALS — BP 110/64 | HR 84 | Temp 98.5°F | Resp 18

## 2013-09-02 DIAGNOSIS — C159 Malignant neoplasm of esophagus, unspecified: Secondary | ICD-10-CM

## 2013-09-02 DIAGNOSIS — R109 Unspecified abdominal pain: Secondary | ICD-10-CM

## 2013-09-02 DIAGNOSIS — R110 Nausea: Secondary | ICD-10-CM

## 2013-09-02 LAB — BASIC METABOLIC PANEL (CC13)
CO2: 28 mEq/L (ref 22–29)
Calcium: 8.2 mg/dL — ABNORMAL LOW (ref 8.4–10.4)
Chloride: 98 mEq/L (ref 98–109)
Glucose: 112 mg/dl (ref 70–140)
Sodium: 136 mEq/L (ref 136–145)

## 2013-09-02 MED ORDER — OXYCODONE-ACETAMINOPHEN 5-325 MG PO TABS
ORAL_TABLET | ORAL | Status: AC
  Filled 2013-09-02: qty 1

## 2013-09-02 MED ORDER — SODIUM CHLORIDE 0.9 % IV SOLN
INTRAVENOUS | Status: DC
  Administered 2013-09-02: 14:00:00 via INTRAVENOUS

## 2013-09-02 MED ORDER — ONDANSETRON HCL 8 MG PO TABS
8.0000 mg | ORAL_TABLET | Freq: Once | ORAL | Status: AC
  Administered 2013-09-02: 8 mg via ORAL

## 2013-09-02 MED ORDER — HEPARIN SOD (PORK) LOCK FLUSH 100 UNIT/ML IV SOLN
500.0000 [IU] | Freq: Once | INTRAVENOUS | Status: AC
  Administered 2013-09-02: 500 [IU] via INTRAVENOUS
  Filled 2013-09-02: qty 5

## 2013-09-02 MED ORDER — OXYCODONE-ACETAMINOPHEN 5-325 MG PO TABS
1.0000 | ORAL_TABLET | ORAL | Status: AC
  Administered 2013-09-02: 1 via ORAL

## 2013-09-02 MED ORDER — ONDANSETRON HCL 8 MG PO TABS
ORAL_TABLET | ORAL | Status: AC
  Filled 2013-09-02: qty 1

## 2013-09-02 MED ORDER — SODIUM CHLORIDE 0.9 % IJ SOLN
10.0000 mL | INTRAMUSCULAR | Status: DC | PRN
  Administered 2013-09-02: 10 mL via INTRAVENOUS
  Filled 2013-09-02: qty 10

## 2013-09-02 NOTE — Patient Instructions (Signed)
Dehydration, Adult Dehydration is when you lose more fluids from the body than you take in. Vital organs like the kidneys, brain, and heart cannot function without a proper amount of fluids and salt. Any loss of fluids from the body can cause dehydration.  CAUSES   Vomiting.  Diarrhea.  Excessive sweating.  Excessive urine output.  Fever. SYMPTOMS  Mild dehydration  Thirst.  Dry lips.  Slightly dry mouth. Moderate dehydration  Very dry mouth.  Sunken eyes.  Skin does not bounce back quickly when lightly pinched and released.  Dark urine and decreased urine production.  Decreased tear production.  Headache. Severe dehydration  Very dry mouth.  Extreme thirst.  Rapid, weak pulse (more than 100 beats per minute at rest).  Cold hands and feet.  Not able to sweat in spite of heat and temperature.  Rapid breathing.  Blue lips.  Confusion and lethargy.  Difficulty being awakened.  Minimal urine production.  No tears. DIAGNOSIS  Your caregiver will diagnose dehydration based on your symptoms and your exam. Blood and urine tests will help confirm the diagnosis. The diagnostic evaluation should also identify the cause of dehydration. TREATMENT  Treatment of mild or moderate dehydration can often be done at home by increasing the amount of fluids that you drink. It is best to drink small amounts of fluid more often. Drinking too much at one time can make vomiting worse. Refer to the home care instructions below. Severe dehydration needs to be treated at the hospital where you will probably be given intravenous (IV) fluids that contain water and electrolytes. HOME CARE INSTRUCTIONS   Ask your caregiver about specific rehydration instructions.  Drink enough fluids to keep your urine clear or pale yellow.  Drink small amounts frequently if you have nausea and vomiting.  Eat as you normally do.  Avoid:  Foods or drinks high in sugar.  Carbonated  drinks.  Juice.  Extremely hot or cold fluids.  Drinks with caffeine.  Fatty, greasy foods.  Alcohol.  Tobacco.  Overeating.  Gelatin desserts.  Wash your hands well to avoid spreading bacteria and viruses.  Only take over-the-counter or prescription medicines for pain, discomfort, or fever as directed by your caregiver.  Ask your caregiver if you should continue all prescribed and over-the-counter medicines.  Keep all follow-up appointments with your caregiver. SEEK MEDICAL CARE IF:  You have abdominal pain and it increases or stays in one area (localizes).  You have a rash, stiff neck, or severe headache.  You are irritable, sleepy, or difficult to awaken.  You are weak, dizzy, or extremely thirsty. SEEK IMMEDIATE MEDICAL CARE IF:   You are unable to keep fluids down or you get worse despite treatment.  You have frequent episodes of vomiting or diarrhea.  You have blood or green matter (bile) in your vomit.  You have blood in your stool or your stool looks black and tarry.  You have not urinated in 6 to 8 hours, or you have only urinated a small amount of very dark urine.  You have a fever.  You faint. MAKE SURE YOU:   Understand these instructions.  Will watch your condition.  Will get help right away if you are not doing well or get worse. Document Released: 10/23/2005 Document Revised: 01/15/2012 Document Reviewed: 06/12/2011 [employer] Patient Information 2014 [employer].  

## 2013-09-02 NOTE — Progress Notes (Signed)
1545:  Pt c/o 6/10 pain in her left side that radiates to her back.  Pt also c/o nausea.  Per Tiana Loft, okay to give 8mg  zofran PO and 1 tablet percocet.  Will continue to monitor closely.  SLJ  1615: pt states nausea is resolved, and that pain is a 5/10.  She is going to radiation oncology and will f/u with Dr Darrold Span at a f/u visit tomorrow.  SLJ

## 2013-09-02 NOTE — Telephone Encounter (Signed)
lmonvm advising the pt of her nutrition appt.

## 2013-09-03 ENCOUNTER — Ambulatory Visit
Admission: RE | Admit: 2013-09-03 | Discharge: 2013-09-03 | Disposition: A | Discharge status: Home / Self Care | Payer: Medicare Other | Source: Ambulatory Visit | Attending: Radiation Oncology | Admitting: Radiation Oncology

## 2013-09-03 ENCOUNTER — Ambulatory Visit: Payer: Medicare Other

## 2013-09-03 ENCOUNTER — Other Ambulatory Visit: Payer: Medicare Other | Admitting: Lab

## 2013-09-03 ENCOUNTER — Other Ambulatory Visit: Payer: Self-pay

## 2013-09-03 ENCOUNTER — Ambulatory Visit (HOSPITAL_BASED_OUTPATIENT_CLINIC_OR_DEPARTMENT_OTHER): Payer: Medicare Other

## 2013-09-03 ENCOUNTER — Encounter: Payer: Self-pay | Admitting: Oncology

## 2013-09-03 ENCOUNTER — Ambulatory Visit (HOSPITAL_BASED_OUTPATIENT_CLINIC_OR_DEPARTMENT_OTHER): Payer: Medicare Other | Admitting: Oncology

## 2013-09-03 DIAGNOSIS — I4581 Long QT syndrome: Secondary | ICD-10-CM

## 2013-09-03 DIAGNOSIS — C155 Malignant neoplasm of lower third of esophagus: Secondary | ICD-10-CM

## 2013-09-03 DIAGNOSIS — C159 Malignant neoplasm of esophagus, unspecified: Secondary | ICD-10-CM

## 2013-09-03 DIAGNOSIS — R110 Nausea: Secondary | ICD-10-CM

## 2013-09-03 DIAGNOSIS — C7951 Secondary malignant neoplasm of bone: Secondary | ICD-10-CM

## 2013-09-03 DIAGNOSIS — G35 Multiple sclerosis: Secondary | ICD-10-CM

## 2013-09-03 DIAGNOSIS — E876 Hypokalemia: Secondary | ICD-10-CM

## 2013-09-03 MED ORDER — LIDOCAINE-PRILOCAINE 2.5-2.5 % EX CREA: TOPICAL_CREAM | CUTANEOUS | Status: AC

## 2013-09-03 MED ORDER — ONDANSETRON 8 MG PO TBDP: 8.0000 mg | ORAL_TABLET | Freq: Three times a day (TID) | ORAL | Status: DC | PRN

## 2013-09-03 MED ORDER — ONDANSETRON 8 MG/50ML IVPB (CHCC)
8.0000 mg | Freq: Once | INTRAVENOUS | Status: AC
  Administered 2013-09-03: 8 mg via INTRAVENOUS

## 2013-09-03 MED ORDER — ONDANSETRON 8 MG/NS 50 ML IVPB
INTRAVENOUS | Status: AC
  Filled 2013-09-03: qty 8

## 2013-09-03 MED ORDER — HEPARIN SOD (PORK) LOCK FLUSH 100 UNIT/ML IV SOLN
500.0000 [IU] | Freq: Once | INTRAVENOUS | Status: DC
  Filled 2013-09-03: qty 5

## 2013-09-03 MED ORDER — OXYCODONE-ACETAMINOPHEN 5-325 MG PO TABS
2.0000 | ORAL_TABLET | Freq: Once | ORAL | Status: AC
  Administered 2013-09-03: 2 via ORAL

## 2013-09-03 MED ORDER — OXYCODONE HCL 5 MG/5ML PO SOLN: 5.0000 mg | ORAL | Status: DC | PRN

## 2013-09-03 MED ORDER — SODIUM CHLORIDE 0.9 % IV SOLN: Freq: Once | INTRAVENOUS | Status: AC

## 2013-09-03 MED ORDER — OXYCODONE-ACETAMINOPHEN 5-325 MG PO TABS
ORAL_TABLET | ORAL | Status: AC
  Filled 2013-09-03: qty 2

## 2013-09-03 MED ORDER — SODIUM CHLORIDE 0.9 % IV SOLN
Freq: Once | INTRAVENOUS | Status: DC
  Filled 2013-09-03: qty 1000

## 2013-09-03 MED ORDER — SODIUM CHLORIDE 0.9 % IJ SOLN
10.0000 mL | INTRAMUSCULAR | Status: DC | PRN
  Administered 2013-09-03: 10 mL via INTRAVENOUS
  Filled 2013-09-03: qty 10

## 2013-09-03 MED ORDER — SODIUM CHLORIDE 0.9 % IV SOLN
1000.0000 mL | Freq: Once | INTRAVENOUS | Status: AC
  Administered 2013-09-03: 1000 mL via INTRAVENOUS

## 2013-09-03 NOTE — Progress Notes (Signed)
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 RT is in process to area of esophageal involvement, planned thru 09-05-13. Zometa was given 08-28-13 with additional IVF. [facility] nutritionist is following.  IVF 10-23 and 10-28 was helpful, and this will be repeated today and next 2 days.  She is still able to swallow liquids and thin solids, has had ~ 1 Boost clear daily, is able to swallow oral meds crushed. She vomited last pm, does not cough with swallowing. We will change her prn oxycodone to liquid and try zofran ODT as prn for nausea. Carafate liquid has been helpful. She is sleeping only an hour or so at a time due to discomfort with throat and low back. Bowels have been somewhat loose.  She is willing to have PEG. I have discussed with Dr [name] now and will refer to general surgery.  PAC has functioned without difficulty for IVF and zometa. Patient requests WC and walker, these due to general weakness from the metastatic cancer and her underlying MS. She expects to get equipment from medical supply, written order given now.  Patient's father was not aware of metastatic disease and did not realize that this cannot be cured. We have discussed overall situation with patient and these family members now.  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 Last upper endoscopy prior to recent evaluation was 3 years ago and reportedly was normal. More recently she has developed difficulty swallowing and different pain, which extends from epigastrium to LLQ.  She had EGD by Dr [name] in [place] which found a stenosed area in distal esophagus which was friable, with abnormal mucosa there and in gastric lumen. Pathology from [facility] reportedly showed adenocarcinomaHER 2 was 2+ by immunohistochemical staining, with FISH negative ([facility] addendum from 24 Sept 2014 accession # [id number]).  She was hospitalized in [facility] system from 9-23 thru 08-01-13 with pneumonia, with SOB, hypoxemia and dizziness, also hypokalemic then.Blood and sputum cultures had no growth other than few candida in the sputum. She had CT CAP in hospital on 07-31-13, with extensive ground glass opacities in lungs with diffuse peribronchial thickening, esophageal dilatation with confluent soft tissue density involving distal esophagus and GE junction/ adjacent stomach, enlarged AP window node and prominent subcarinal node, mildly enlarged nodes retroperitoneum and root of mesentery, nodal mass 5.4 x 2.7 cm encasing celiac axis and proximal superior mesenteric artery, diffuse hepatic steatosis and bilateral adrenal hyperplasia. She also had endoscopic ultrasound by Dr [name] on 08-08-13, with stenosing and friable mass from 40 cm extending 5 cm into proximal stomach with maximal depth of invasion 28mm, extending beyond the adventitia; two large lymph nodes were identified, measuring 10mm, however celiac axis area could not be evaluated. Dr Haywood Pao impression at that procedure was of T4N2 distal esophageal/ proximal gastric tumor. MRI head 08-04-13 was unremarkable. PET 08-19-13 showed primary distal esophagus/ GE junction with involved nodes at gastrohepatic ligament and celiac axis as well as left periaortics, and bony mets T7, T11 and possibly T9 and T10. RT is in process to area of primary tumor in esophagus, planned thru 09-05-13.  Review of systems as above, also: No fever or symptoms of infection. Is voiding some. Felt more weak overall for ~ a day after zometa. Remainder  of 10 point Review of Systems negative.  I spoke by phone with Dr Betti [facility] prior to this visit, discussing pain medication in setting of continuing methadone. He is in full agreement with patient receiving additional pain medication, and notes that her tolerance is likely higher due to ongoing methadone.  I have discussed by phone with Dr [name] now, who feels that general surgery will have to place peg rather than GI or IR.   I have discussed chemotherapy with my partner in GI oncology, with recommendation for FOLFOX; she will have chemotherapy education done for this regimen.    Objective:  Vital signs in last 24 hours:  BP 112/69  Pulse 96  Temp(Src) 97.7 F (36.5 C) (Oral)  Resp 18  Ht 5\' 3"  (1.6 m)  Wt 108 lb 12.8 oz (49.351 kg)  BMI 19.28 kg/m2  LMP 04/28/2011  Weight is down 3 lbs from 08-25-13.  Looks uncomfortable but not in acute distress. Alert, oriented and appropriate. Ambulatory without assistance.    HEENT:PERRL, sclerae not icteric. Oral mucosa somewhat dry, without lesions, posterior pharynx clear.  Neck supple. No JVD.  Lymphatics:no cervical,suraclavicular adenopathy Resp: clear to auscultation bilaterally and normal percussion bilaterally Cardio: tachy, regular rate and rhythm. No gallop. GI: soft, nontender including epigastrium, not distended, no mass or organomegaly. Some active bowel sounds.  Musculoskeletal/ Extremities: without pitting edema, cords, tenderness Neuro: no focal deficits. Note MS history. Psych as above Skin poor turgor, without rash, ecchymosis, petechiae Portacath-without erythema or tenderness  Lab Results:  Results for orders placed in visit on 09/02/13  BASIC METABOLIC PANEL ([facility]13)      Result Value Range   Sodium 136  136 - 145 mEq/L   Potassium 3.4 (*) 3.5 - 5.1 mEq/L   Chloride 98  98 - 109 mEq/L   CO2 28  22 - 29 mEq/L   Glucose 112  70 - 140 mg/dl   BUN 8.7  7.0 - 08.6 mg/dL   Creatinine 0.6   0.6 - 1.1 mg/dL   Calcium 8.2 (*) 8.4 - 10.4 mg/dL   Anion Gap 10  3 - 11 mEq/L     Studies/Results:  No results found.  Medications: I have reviewed the patient's current medications. Will try liquid oxycodone and zofran ODT. She continues regular methadone by Dr Betti Cruz. Continue carafate liquid. She has had flu vaccine  DISCUSSION: patient and family are in agreement with interventions as above, including PEG placement by general surgery if possible. Continue IVF several times weekly until PEG allows better hydratrion. I will see her back next week after chemo education class. Start of chemo will depend on PEG.  Assessment/Plan: 1. 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 Plan FOLFOX in palliative attempt after RT and PEG. Zometa begun 08-28-13. Needs outpatient IVF at least several times weekly for now.  2.chronic pancreatitis since cholecystectomy 1993, better in past with adenopathy including mass at celiac axis, left supraclavicular node, retroperitoneal nodes, several bony mets in T spine by PET: RT to primary tumor area started today. We will plan to start chemo in palliative attempt likely with FOLFOX after RT completes. I will coordinate pain management with [facility].   2 chronic pancreatitis since  cholecystectomy 1993, better in past 2 years post sphincterotomy and stents  3. On methadone maintenance for drug history. I have discussed with Dr Betti Cruz and appreciate his input. 4.long tobacco just DCd  5. 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 Follow 12. Interstitial pneumonia treated during  hospitalization in Sept.         Reece Packer, MD   09/03/2013, 2:11 PM

## 2013-09-03 NOTE — Patient Instructions (Signed)
Dehydration, Adult  Dehydration means your body does not have as much fluid as it needs. Your kidneys, brain, and heart will not work properly without the right amount of fluids and salt.   HOME CARE   Ask your doctor how to replace body fluid losses (rehydrate).   Drink enough fluids to keep your pee (urine) clear or pale yellow.   Drink small amounts of fluids often if you feel sick to your stomach (nauseous) or throw up (vomit).   Eat like you normally do.   Avoid:   Foods or drinks high in sugar.   Bubbly (carbonated) drinks.   Juice.   Very hot or cold fluids.   Drinks with caffeine.   Fatty, greasy foods.   Alcohol.   Tobacco.   Eating too much.   Gelatin desserts.   Wash your hands to avoid spreading germs (bacteria, viruses).   Only take medicine as told by your doctor.   Keep all doctor visits as told.  GET HELP RIGHT AWAY IF:    You cannot drink something without throwing up.   You get worse even with treatment.   Your vomit has blood in it or looks greenish.   Your poop (stool) has blood in it or looks black and tarry.   You have not peed in 6 to 8 hours.   You pee a small amount of very dark pee.   You have a fever.   You pass out (faint).   You have belly (abdominal) pain that gets worse or stays in one spot (localizes).   You have a rash, stiff neck, or bad headache.   You get easily annoyed, sleepy, or are hard to wake up.   You feel weak, dizzy, or very thirsty.  MAKE SURE YOU:    Understand these instructions.   Will watch your condition.   Will get help right away if you are not doing well or get worse.  Document Released: 08/19/2009 Document Revised: 01/15/2012 Document Reviewed: 06/12/2011  [employer] Patient Information 2014 [employer].

## 2013-09-04 ENCOUNTER — Other Ambulatory Visit: Payer: Medicare Other

## 2013-09-04 ENCOUNTER — Ambulatory Visit
Admission: RE | Admit: 2013-09-04 | Discharge: 2013-09-04 | Disposition: A | Discharge status: Home / Self Care | Payer: Medicare Other | Source: Ambulatory Visit | Attending: Radiation Oncology | Admitting: Radiation Oncology

## 2013-09-04 ENCOUNTER — Ambulatory Visit: Payer: Medicare Other

## 2013-09-04 ENCOUNTER — Ambulatory Visit (HOSPITAL_BASED_OUTPATIENT_CLINIC_OR_DEPARTMENT_OTHER): Payer: Medicare Other

## 2013-09-04 ENCOUNTER — Ambulatory Visit: Payer: Medicare Other | Admitting: Nutrition

## 2013-09-04 DIAGNOSIS — C155 Malignant neoplasm of lower third of esophagus: Secondary | ICD-10-CM

## 2013-09-04 DIAGNOSIS — R110 Nausea: Secondary | ICD-10-CM

## 2013-09-04 DIAGNOSIS — C159 Malignant neoplasm of esophagus, unspecified: Secondary | ICD-10-CM

## 2013-09-04 DIAGNOSIS — R109 Unspecified abdominal pain: Secondary | ICD-10-CM

## 2013-09-04 MED ORDER — ONDANSETRON 8 MG/50ML IVPB (CHCC)
8.0000 mg | Freq: Once | INTRAVENOUS | Status: AC
  Administered 2013-09-04: 8 mg via INTRAVENOUS

## 2013-09-04 MED ORDER — SODIUM CHLORIDE 0.9 % IV SOLN
INTRAVENOUS | Status: DC
  Administered 2013-09-04: 15:00:00 via INTRAVENOUS
  Filled 2013-09-04: qty 1000

## 2013-09-04 MED ORDER — OXYCODONE-ACETAMINOPHEN 5-325 MG PO TABS
ORAL_TABLET | ORAL | Status: AC
  Filled 2013-09-04: qty 1

## 2013-09-04 MED ORDER — OXYCODONE-ACETAMINOPHEN 5-325 MG PO TABS
1.0000 | ORAL_TABLET | Freq: Once | ORAL | Status: AC
  Administered 2013-09-04: 1 via ORAL

## 2013-09-04 NOTE — Progress Notes (Signed)
Pt returned from radiation c/o pain 6/10 in left abdomen and nausea.  MD Chism on call - ordered 8 mg IV zofran and percocet.  Administered.  Pt reports "feeling much better" on discharge.

## 2013-09-04 NOTE — Patient Instructions (Signed)

## 2013-09-04 NOTE — Progress Notes (Signed)
Patient reports she is scheduled to have a feeding tube placed.  She is finding it more and more difficult to swallow.  She tends to tolerate thicker, soft foods better than thin liquids.  She is trying to drink boost breeze daily.  She has Slim fast powder that she is using in foods.  Weight documented as 108.8 pounds on October 29.  This is decreased from 119.6 pounds on October 13.  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  Patient is looking forward to placement.  Patient is at risk for refeeding syndrome secondary to severe weight loss.  Patient meets criteria for severe malnutrition in the context of acute illness secondary to 9% weight loss in 3 weeks, less than 50% of estimated energy requirements for greater than 5 days and depletion of fat and muscle stores on physical exam.  Estimated nutrition needs: 1600-1800 calories, 80-100 g protein, 1.8 L fluid.  Nutrition diagnosis: Unintended weight loss continues.  Intervention: Patient was educated to continue small frequent meals and snacks throughout the day concentrating on high-protein, high calorie foods and liquids as tolerated.  Patient chooses to use [facility] for her homecare agency.  She is thinking about whether she prefers a bolus method or a continuous method for feeding.  Patient was educated to document food intake until PEG placed.  Monitoring, evaluation, goals: Patient will tolerate oral intake and tube feedings to minimize further weight loss and promote repletion of lean body mass without showing signs of refeeding syndrome.  Next visit: Will schedule after feeding tube placed.  Patient has my contact information if she needs anything before this.

## 2013-09-05 ENCOUNTER — Encounter: Payer: Self-pay | Admitting: Radiation Oncology

## 2013-09-05 ENCOUNTER — Ambulatory Visit: Payer: Medicare Other

## 2013-09-05 ENCOUNTER — Ambulatory Visit
Admission: RE | Admit: 2013-09-05 | Discharge: 2013-09-05 | Disposition: A | Discharge status: Home / Self Care | Payer: Medicare Other | Source: Ambulatory Visit | Attending: Radiation Oncology | Admitting: Radiation Oncology

## 2013-09-05 ENCOUNTER — Telehealth: Payer: Self-pay | Admitting: *Deleted

## 2013-09-05 VITALS — BP 110/73 | HR 84 | Temp 97.7°F

## 2013-09-05 DIAGNOSIS — C159 Malignant neoplasm of esophagus, unspecified: Secondary | ICD-10-CM

## 2013-09-05 MED ORDER — MAGIC MOUTHWASH W/LIDOCAINE: ORAL | Status: DC

## 2013-09-05 NOTE — Progress Notes (Signed)
[name] completes radiation therapy to her esophagus today.  She grades pain as a level 3-4 in the right lower lumbar region with occ. pain left lumbar region.  She also reports intermittent nausea today and this is relieved with Zofran Q 8 hrs prn.  Currently her PO intake consist of liquids only, but c/o pain in the mid-esophageal region if she eats food.   Dr. Darrold Span is to schedule Peg tube placement.  After the PEG is placed she will start chemotherapy with 5 FU. Weight today 111.9 lb compared 108.4 on 09/01/13 when last seen by [name].

## 2013-09-05 NOTE — Progress Notes (Signed)
   Weekly Management Note  outpatient  Completed Radiotherapy. Total Dose:  30Gy   Narrative:  The patient presents for routine under treatment assessment on last day of radiotherapy.  CBCT/MVCT images/Port film x-rays were reviewed.  The chart was checked. She completes radiation therapy to her esophagus today. She grades pain as a level 3-4 in the right flank region with occ. pain left flank region. She also reports intermittent nausea today and this is relieved with Zofran Q 8 hrs prn. Currently her PO intake consist of liquids only, but c/o pain in the mid-esophageal region if she eats food. Dr. Darrold Span is to schedule Peg tube placement. After the PEG is placed she will start chemotherapy with 5 FU. Weight today 111.9 lb compared 108.4 on 09/01/13 when last seen by me.  She has some esophageal pain with swallowing.   Physical Findings:  temperature is 97.7 F (36.5 C). Her blood pressure is 110/73 and her pulse is 84.   NAD, minimal skin changes over back  Impression:  The patient has tolerated radiotherapy.  Plan:  Routine follow-up in one month with my partner while I am on maternity leave. MMW prescribed today for esophagitis.  Continue carafate PRN as well.  Anticipate it could take 1-2 more weeks to see if RT helps flank pain which could be stemming from lower T spine mets. Aloe vera can be used if skin irritation develops in RT fields. ________________________________   Lonie Peak, M.D.

## 2013-09-05 NOTE — Telephone Encounter (Signed)
Patient called and moved her appt from today to tomorrow  

## 2013-09-06 ENCOUNTER — Ambulatory Visit (HOSPITAL_BASED_OUTPATIENT_CLINIC_OR_DEPARTMENT_OTHER): Payer: Medicare Other

## 2013-09-06 ENCOUNTER — Other Ambulatory Visit: Payer: Self-pay | Admitting: Oncology

## 2013-09-06 DIAGNOSIS — C155 Malignant neoplasm of lower third of esophagus: Secondary | ICD-10-CM

## 2013-09-06 MED ORDER — SODIUM CHLORIDE 0.9 % IV SOLN
INTRAVENOUS | Status: DC
  Administered 2013-09-06: 09:00:00 via INTRAVENOUS

## 2013-09-06 MED ORDER — HEPARIN SOD (PORK) LOCK FLUSH 100 UNIT/ML IV SOLN
500.0000 [IU] | Freq: Once | INTRAVENOUS | Status: AC
  Administered 2013-09-06: 500 [IU] via INTRAVENOUS
  Filled 2013-09-06: qty 5

## 2013-09-06 MED ORDER — SODIUM CHLORIDE 0.9 % IJ SOLN
10.0000 mL | INTRAMUSCULAR | Status: DC | PRN
  Administered 2013-09-06: 10 mL via INTRAVENOUS
  Filled 2013-09-06: qty 10

## 2013-09-06 MED ORDER — SODIUM CHLORIDE 0.9 % IV SOLN: INTRAVENOUS | Status: DC

## 2013-09-07 NOTE — Progress Notes (Signed)
  Radiation Oncology         [phone] ________________________________  Name: [name] MRN: [id number]  Date: 09/05/2013  DOB: June 09, 1966  End of Treatment Note  Diagnosis:   Metastatic esophageal cancer    Indication for treatment:  palliative       Radiation treatment dates:   08/25/2013-09/05/2013  Site/dose:  Distal esophagus / GE junction / T9-11 / 30 Gy in 10 fractions  Beams/energy:   AP/PA / 15 MV photons  Narrative: The patient tolerated radiation treatment relatively well.   She developed modest esophagitis.  Continued to have back pain by the end of therapy.  Plan: The patient has completed radiation treatment. The patient will return to radiation oncology clinic for routine followup in one month. I advised them to call or return sooner if they have any questions or concerns related to their recovery or treatment.Routine follow-up in one month with my partner while I am on maternity leave. MMW prescribed for esophagitis. Continue carafate PRN as well. Anticipate it could take 1-2 more weeks to see if RT helps flank pain which could be stemming from lower T spine mets. Aloe vera can be used if skin irritation develops in RT fields.   -----------------------------------  Lonie Peak, MD

## 2013-09-08 ENCOUNTER — Ambulatory Visit (HOSPITAL_BASED_OUTPATIENT_CLINIC_OR_DEPARTMENT_OTHER): Payer: Medicare Other | Admitting: Lab

## 2013-09-08 ENCOUNTER — Ambulatory Visit: Payer: Medicare Other

## 2013-09-08 ENCOUNTER — Ambulatory Visit (HOSPITAL_BASED_OUTPATIENT_CLINIC_OR_DEPARTMENT_OTHER): Payer: Medicare Other | Admitting: Oncology

## 2013-09-08 ENCOUNTER — Telehealth: Payer: Self-pay

## 2013-09-08 ENCOUNTER — Other Ambulatory Visit: Payer: Self-pay | Admitting: Oncology

## 2013-09-08 ENCOUNTER — Ambulatory Visit (HOSPITAL_BASED_OUTPATIENT_CLINIC_OR_DEPARTMENT_OTHER): Payer: Medicare Other

## 2013-09-08 ENCOUNTER — Encounter (INDEPENDENT_AMBULATORY_CARE_PROVIDER_SITE_OTHER): Payer: Self-pay

## 2013-09-08 ENCOUNTER — Other Ambulatory Visit: Payer: Medicare Other

## 2013-09-08 ENCOUNTER — Encounter: Payer: Self-pay | Admitting: Oncology

## 2013-09-08 DIAGNOSIS — E43 Unspecified severe protein-calorie malnutrition: Secondary | ICD-10-CM

## 2013-09-08 DIAGNOSIS — E038 Other specified hypothyroidism: Secondary | ICD-10-CM

## 2013-09-08 DIAGNOSIS — C155 Malignant neoplasm of lower third of esophagus: Secondary | ICD-10-CM

## 2013-09-08 DIAGNOSIS — E876 Hypokalemia: Secondary | ICD-10-CM

## 2013-09-08 DIAGNOSIS — C7951 Secondary malignant neoplasm of bone: Secondary | ICD-10-CM

## 2013-09-08 DIAGNOSIS — R110 Nausea: Secondary | ICD-10-CM

## 2013-09-08 DIAGNOSIS — C159 Malignant neoplasm of esophagus, unspecified: Secondary | ICD-10-CM

## 2013-09-08 DIAGNOSIS — Z79899 Other long term (current) drug therapy: Secondary | ICD-10-CM

## 2013-09-08 LAB — BASIC METABOLIC PANEL (CC13)
CO2: 28 mEq/L (ref 22–29)
Glucose: 73 mg/dl (ref 70–140)
Potassium: 3.2 mEq/L — ABNORMAL LOW (ref 3.5–5.1)
Sodium: 137 mEq/L (ref 136–145)

## 2013-09-08 LAB — CBC WITH DIFFERENTIAL/PLATELET
Basophils Absolute: 0 10*3/uL (ref 0.0–0.1)
Eosinophils Absolute: 0.1 10*3/uL (ref 0.0–0.5)
HCT: 41 % (ref 34.8–46.6)
HGB: 14.1 g/dL (ref 11.6–15.9)
MCV: 90.9 fL (ref 79.5–101.0)
MONO#: 0.6 10*3/uL (ref 0.1–0.9)
MONO%: 6.3 % (ref 0.0–14.0)
NEUT#: 7.5 10*3/uL — ABNORMAL HIGH (ref 1.5–6.5)
NEUT%: 83.4 % — ABNORMAL HIGH (ref 38.4–76.8)
RDW: 14 % (ref 11.2–14.5)
lymph#: 0.9 10*3/uL (ref 0.9–3.3)

## 2013-09-08 MED ORDER — SODIUM CHLORIDE 0.9 % IJ SOLN
10.0000 mL | INTRAMUSCULAR | Status: DC | PRN
  Administered 2013-09-08: 10 mL via INTRAVENOUS
  Filled 2013-09-08: qty 10

## 2013-09-08 MED ORDER — ONDANSETRON 8 MG/NS 50 ML IVPB
INTRAVENOUS | Status: AC
  Filled 2013-09-08: qty 8

## 2013-09-08 MED ORDER — ONDANSETRON 8 MG/50ML IVPB (CHCC)
8.0000 mg | Freq: Once | INTRAVENOUS | Status: AC | PRN
  Administered 2013-09-08: 8 mg via INTRAVENOUS

## 2013-09-08 MED ORDER — HEPARIN SOD (PORK) LOCK FLUSH 100 UNIT/ML IV SOLN
500.0000 [IU] | Freq: Once | INTRAVENOUS | Status: AC
  Administered 2013-09-08: 500 [IU] via INTRAVENOUS
  Filled 2013-09-08: qty 5

## 2013-09-08 MED ORDER — SODIUM CHLORIDE 0.9 % IV SOLN
Freq: Once | INTRAVENOUS | Status: AC
  Administered 2013-09-08: 16:00:00 via INTRAVENOUS
  Filled 2013-09-08: qty 1000

## 2013-09-08 NOTE — Telephone Encounter (Signed)
The referral made was not complete as the Dept. [facility] needed to be entered.  [facility] staff member talked this nurse through the editing of the  referral under  appointment desk.  The dept. Was entered and [facility] appointment scheduler can see the referral and will schedule the appointment with in the next 24 hours.  Appreciated there assistance.

## 2013-09-08 NOTE — Telephone Encounter (Signed)
Message copied by Lorine Bears on Mon Sep 08, 2013  3:02 PM ------      Message from: Reece Packer      Created: Fri Sep 05, 2013 10:20 PM       Please check on 11-3 to be sure referral to general surgery for PEG is in process - I don't see this apt yet.      Thanks      Cc LA, TH ------

## 2013-09-08 NOTE — Patient Instructions (Addendum)
Please arrive to check in at 245 pm.  Fluids 3 pm 09/09/13.     You received IV fluids with Potassium Today   Dehydration, Adult Dehydration is when you lose more fluids from the body than you take in. Vital organs like the kidneys, brain, and heart cannot function without a proper amount of fluids and salt. Any loss of fluids from the body can cause dehydration.  CAUSES   Vomiting.  Diarrhea.  Excessive sweating.  Excessive urine output.  Fever. SYMPTOMS  Mild dehydration  Thirst.  Dry lips.  Slightly dry mouth. Moderate dehydration  Very dry mouth.  Sunken eyes.  Skin does not bounce back quickly when lightly pinched and released.  Dark urine and decreased urine production.  Decreased tear production.  Headache. Severe dehydration  Very dry mouth.  Extreme thirst.  Rapid, weak pulse (more than 100 beats per minute at rest).  Cold hands and feet.  Not able to sweat in spite of heat and temperature.  Rapid breathing.  Blue lips.  Confusion and lethargy.  Difficulty being awakened.  Minimal urine production.  No tears. DIAGNOSIS  Your caregiver will diagnose dehydration based on your symptoms and your exam. Blood and urine tests will help confirm the diagnosis. The diagnostic evaluation should also identify the cause of dehydration. TREATMENT  Treatment of mild or moderate dehydration can often be done at home by increasing the amount of fluids that you drink. It is best to drink small amounts of fluid more often. Drinking too much at one time can make vomiting worse. Refer to the home care instructions below. Severe dehydration needs to be treated at the hospital where you will probably be given intravenous (IV) fluids that contain water and electrolytes. HOME CARE INSTRUCTIONS   Ask your caregiver about specific rehydration instructions.  Drink enough fluids to keep your urine clear or pale yellow.  Drink small amounts frequently if  you have nausea and vomiting.  Eat as you normally do.  Avoid:  Foods or drinks high in sugar.  Carbonated drinks.  Juice.  Extremely hot or cold fluids.  Drinks with caffeine.  Fatty, greasy foods.  Alcohol.  Tobacco.  Overeating.  Gelatin desserts.  Wash your hands well to avoid spreading bacteria and viruses.  Only take over-the-counter or prescription medicines for pain, discomfort, or fever as directed by your caregiver.  Ask your caregiver if you should continue all prescribed and over-the-counter medicines.  Keep all follow-up appointments with your caregiver. SEEK MEDICAL CARE IF:  You have abdominal pain and it increases or stays in one area (localizes).  You have a rash, stiff neck, or severe headache.  You are irritable, sleepy, or difficult to awaken.  You are weak, dizzy, or extremely thirsty. SEEK IMMEDIATE MEDICAL CARE IF:   You are unable to keep fluids down or you get worse despite treatment.  You have frequent episodes of vomiting or diarrhea.  You have blood or green matter (bile) in your vomit.  You have blood in your stool or your stool looks black and tarry.  You have not urinated in 6 to 8 hours, or you have only urinated a small amount of very dark urine.  You have a fever.  You faint. MAKE SURE YOU:   Understand these instructions.  Will watch your condition.  Will get help right away if you are not doing well or get worse. Document Released: 10/23/2005 Document Revised: 01/15/2012 Document Reviewed: 06/12/2011 [facility] Patient Information 2014 [place], [place].

## 2013-09-08 NOTE — Progress Notes (Signed)
OFFICE PROGRESS NOTE   09/08/2013   Physicians:Patrick Felix Ahmadi (PCP), Bindubal Balan, Trudie Buckler ([place], neurology), Reddy/ Dartha Lodge NP ([facility]), K.Hilty    INTERVAL HISTORY:  Patient is seen in infusion area,with mother present,  receiving IVF due to inadequate po intake with metastatic esophageal cancer. Referral to General Surgery for PEG unfortunately did not happen last week, initially due to problem with the EPIC order(resolved today), then need for PCP authorization of the referral (which we did not know about until today). I have spoken directly with Dr Tyson Dense shortly before 5 PM today and he will give authorization to [facility]. I believe that she will thus be able to see Dr Claud Kelp on 09-09-13. Note esophageal lumen too narrow for GI or IR to do PEG per Dr [name].  Patient is still swallowing very tiny amounts of liquids. Nausea is managed well with ODT zofran. Liquid oxycodone is much easier than tablets. Pain is mostly in mid and low back. She has had no fever and no symptoms of UTI, tho she has had UTIs in past. Breathing is ok and no symptoms of aspiration. PAC is functioning well.  She has not yet had chemo teaching for FOLFOX.  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 Prior upper endoscopy 3 years ago was normal. Recently she developed difficulty swallowing and pain from epigastrium to LLQ.  EGD by Dr [name] in [place]  found a stenosed area in distal esophagus which was friable, with abnormal mucosa there and in gastric lumen. Pathology from [facility]  showed adenocarcinomaHER 2 was 2+ by immunohistochemical staining, with FISH negative ([facility] addendum from 24 Sept 2014 accession # [id number]). She was hospitalized in [facility] system 9-23  thru 08-01-13 with pneumonia, with SOB, hypoxemia and dizziness, also hypokalemic. CT CAP  07-31-13 had extensive ground glass opacities in lungs with diffuse peribronchial thickening, esophageal dilatation with confluent soft tissue density involving distal esophagus and GE junction/ adjacent stomach, enlarged AP window node and prominent subcarinal node, mildly enlarged nodes retroperitoneum and root of mesentery, nodal mass 5.4 x 2.7 cm encasing celiac axis and proximal superior mesenteric artery, diffuse hepatic steatosis and bilateral adrenal hyperplasia. Endoscopic ultrasound by Dr [name] 08-08-13 documented stenosing and friable mass from 40 cm extending 5 cm into proximal stomach with maximal depth of invasion 28mm, extending beyond the adventitia; two large lymph nodes were identified, measuring 10mm, however celiac axis area could not be evaluated, this felt to be T4N2 distal esophageal/ proximal gastric tumor. MRI head 08-04-13 was unremarkable. PET 08-19-13 showed primary distal esophagus/ GE junction with involved nodes at gastrohepatic ligament and celiac axis as well as left periaortics, and bony mets T7, T11 and possibly T9 and T10. She received 30 Gy to distal esophagus/GE junction/ T9-11 by Dr Basilio Cairo from 10-20 thru 09-05-13. Palliative chemotherapy with FOLFOX has not yet begun due to need for PEG, as swallowing has progressively worsened.   Review of systems as above, also: No new or different neurologic symptoms. No bladder symptoms. No LE swelling. No symptoms of aspiration. Remainder of 10 point Review of Systems negative.  Objective:  Vital signs in last 24 hours: per infusion flow sheets. Respirations 18 and not labored supine on RA. Afebrile. Heart rate 90 regular. Lying on right side in recliner. Alert, oriented and appropriate, appears dehydrate  and generally uncomfortable, but not in acute distress  HEENT:PERRL, sclerae not icteric. Oral mucosa dry without lesions, posterior  pharynx clear.  Neck supple.  Lymphatics:no cervical,suraclavicular adenopathy Resp: clear to auscultation bilaterally Cardio: regular rate and rhythm. No gallop. GI: soft, not tender to gentle exam, not distended, Musculoskeletal/ Extremities: without pitting edema, cords, tenderness Neuro/Psych: nonfocal, and as above Skin without rash, ecchymosis, petechiae Portacath-without erythema or tenderness  Lab Results:  Results for orders placed in visit on 09/08/13  CBC WITH DIFFERENTIAL      Result Value Range   WBC 9.0  3.9 - 10.3 10e3/uL   NEUT# 7.5 (*) 1.5 - 6.5 10e3/uL   HGB 14.1  11.6 - 15.9 g/dL   HCT 14.7  82.9 - 56.2 %   Platelets 464 (*) 145 - 400 10e3/uL   MCV 90.9  79.5 - 101.0 fL   MCH 31.3  25.1 - 34.0 pg   MCHC 34.4  31.5 - 36.0 g/dL   RBC 1.30  8.65 - 7.84 10e6/uL   RDW 14.0  11.2 - 14.5 %   lymph# 0.9  0.9 - 3.3 10e3/uL   MONO# 0.6  0.1 - 0.9 10e3/uL   Eosinophils Absolute 0.1  0.0 - 0.5 10e3/uL   Basophils Absolute 0.0  0.0 - 0.1 10e3/uL   NEUT% 83.4 (*) 38.4 - 76.8 %   LYMPH% 9.4 (*) 14.0 - 49.7 %   MONO% 6.3  0.0 - 14.0 %   EOS% 0.7  0.0 - 7.0 %   BASO% 0.2  0.0 - 2.0 %   nRBC 0  0 - 0 %  BASIC METABOLIC PANEL ([facility]13)      Result Value Range   Sodium 137  136 - 145 mEq/L   Potassium 3.2 (*) 3.5 - 5.1 mEq/L   Chloride 96 (*) 98 - 109 mEq/L   CO2 28  22 - 29 mEq/L   Glucose 73  70 - 140 mg/dl   BUN 8.3  7.0 - 69.6 mg/dL   Creatinine 0.5 (*) 0.6 - 1.1 mg/dL   Calcium 8.8  8.4 - 29.5 mg/dL   Anion Gap 13 (*) 3 - 11 mEq/L     Studies/Results:  No results found.  Medications: I have reviewed the patient's current medications. She has received zofran 8 mg IV with one liter NS/20 KCl now.   Assessment/Plan: 1. 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 Refer to surgery,PEG needed for hydration and nutritional support. She will likely need IVF 11-4 and may need additional this week depending on PEG. Plan FOLFOX after PEG  placement. 2 chronic pancreatitis since cholecystectomy 1993, better in past 2 years post sphincterotomy and stents  3. On methadone maintenance for drug history. I have discussed with Dr Betti Cruz last week. 4.long tobacco just DCd  5. 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 K+ supplemented with IVF today as she is having difficulty with usual po K+ now.  12. Severe malnutrition in context of acute illness: [facility] dietician aware and will assist with tube feedings        Haily Caley P, MD   09/08/2013, 5:19 PM

## 2013-09-09 ENCOUNTER — Ambulatory Visit (INDEPENDENT_AMBULATORY_CARE_PROVIDER_SITE_OTHER): Payer: Medicare Other | Admitting: General Surgery

## 2013-09-09 ENCOUNTER — Encounter (INDEPENDENT_AMBULATORY_CARE_PROVIDER_SITE_OTHER): Payer: Self-pay | Admitting: General Surgery

## 2013-09-09 ENCOUNTER — Ambulatory Visit (HOSPITAL_BASED_OUTPATIENT_CLINIC_OR_DEPARTMENT_OTHER): Payer: Medicare Other

## 2013-09-09 ENCOUNTER — Ambulatory Visit (INDEPENDENT_AMBULATORY_CARE_PROVIDER_SITE_OTHER): Payer: Self-pay | Admitting: General Surgery

## 2013-09-09 ENCOUNTER — Ambulatory Visit: Payer: Medicare Other

## 2013-09-09 VITALS — BP 120/68 | HR 88 | Temp 97.6°F | Resp 15 | Ht 63.0 in | Wt 109.6 lb

## 2013-09-09 VITALS — BP 127/87 | HR 92 | Temp 97.5°F

## 2013-09-09 DIAGNOSIS — C7951 Secondary malignant neoplasm of bone: Secondary | ICD-10-CM

## 2013-09-09 DIAGNOSIS — C159 Malignant neoplasm of esophagus, unspecified: Secondary | ICD-10-CM

## 2013-09-09 DIAGNOSIS — G35 Multiple sclerosis: Secondary | ICD-10-CM

## 2013-09-09 DIAGNOSIS — E876 Hypokalemia: Secondary | ICD-10-CM

## 2013-09-09 DIAGNOSIS — C155 Malignant neoplasm of lower third of esophagus: Secondary | ICD-10-CM

## 2013-09-09 DIAGNOSIS — E43 Unspecified severe protein-calorie malnutrition: Secondary | ICD-10-CM

## 2013-09-09 MED ORDER — SODIUM CHLORIDE 0.9 % IJ SOLN
10.0000 mL | INTRAMUSCULAR | Status: DC | PRN
  Administered 2013-09-09: 10 mL via INTRAVENOUS
  Filled 2013-09-09: qty 10

## 2013-09-09 MED ORDER — HEPARIN SOD (PORK) LOCK FLUSH 100 UNIT/ML IV SOLN
500.0000 [IU] | Freq: Once | INTRAVENOUS | Status: AC
  Administered 2013-09-09: 500 [IU] via INTRAVENOUS
  Filled 2013-09-09: qty 5

## 2013-09-09 MED ORDER — SODIUM CHLORIDE 0.9 % IV SOLN
INTRAVENOUS | Status: DC
  Administered 2013-09-09: 16:00:00 via INTRAVENOUS
  Filled 2013-09-09: qty 1000

## 2013-09-09 MED ORDER — ONDANSETRON 8 MG/50ML IVPB (CHCC)
8.0000 mg | Freq: Once | INTRAVENOUS | Status: AC | PRN
  Administered 2013-09-09: 8 mg via INTRAVENOUS

## 2013-09-09 MED ORDER — ONDANSETRON 8 MG/NS 50 ML IVPB
INTRAVENOUS | Status: AC
  Filled 2013-09-09: qty 8

## 2013-09-09 NOTE — Patient Instructions (Signed)
We have reviewed all of your medical problems, with emphasis on your esophageal cancer and malnutrition.  You will be scheduled for an operation under general anesthesia to place a feeding tube in your stomach. We will schedule this at [facility] in the near future.      Care of a Feeding Tube People who have trouble swallowing or cannot take food or medicine by mouth are sometimes given feeding tubes. A feeding tube can go into the nose and down to the stomach or through the skin in the abdomen and into the stomach or small bowel. Some of the names of these feeding tubes are gastrostomy tubes, PEG lines, nasogastric tubes, and gastrojejunostomy tubes.  SUPPLIES NEEDED TO CARE FOR THE TUBE SITE  Clean gloves.  Clean wash cloth, gauze pads, or soft paper towel.  Cotton swabs.  Skin barrier ointment or cream.  Soap and water.  Pre-cut foam pads or gauze (that go around the tube).  Tube tape. TUBE SITE CARE 1. Have all supplies ready and available. 2. Wash hands well. 3. Put on clean gloves. 4. Remove the soiled foam pad or gauze, if present, that is found under the tube stabilizer. Change the foam pad or gauze daily or when soiled or moist. 5. Check the skin around the tube site for redness, rash, swelling, drainage, or extra tissue growth. If you notice any of these, call your caregiver. 6. Moisten gauze and cotton swabs with water and soap. 7. Wipe the area closest to the tube (right near the stoma) with cotton swabs. Wipe the surrounding skin with moistened gauze. Rinse with water. 8. Dry the skin and stoma site with a dry gauze pad or soft paper towel. Do not use antibiotic ointments at the tube site. 9. If the skin is red, apply a skin barrier cream or ointment (such as petroleum jelly) in a circular motion, using a cotton swab. The cream or ointment will provide a moisture barrier for the skin and helps with wound healing. 10. Apply a new pre-cut foam pad or gauze around the  tube. Secure it with tape around the edges. If no drainage is present, foam pads or gauze may be left off. 11. Use tape or an anchoring device to fasten the feeding tube to the skin for comfort or as directed. Rotate where you tape the tube to avoid skin damage from the adhesive. 12. Position the person in a semi-upright position (30 45 degree angle). 13. Throw away used supplies. 14. Remove gloves. 15. Wash hands. SUPPLIES NEEDED TO FLUSH A FEEDING TUBE  Clean gloves.  60 mL syringe (that connects to the feeding tube).  Towel.  Water. FLUSHING A FEEDING TUBE  1. Have all supplies ready and available. 2. Wash hands well. 3. Put on clean gloves. 4. Draw up 30 mL of water in the syringe. 5. Kink the feeding tube while disconnecting it from the feeding-bag tubing or while removing the plug at the end of the tube. Kinking closes the tube and prevents secretions in the tube from spilling out. 6. Insert the tip of the syringe into the end of the feeding tube. Release the kink. Slowly inject the water. 7. If unable to inject the water, the person with the feeding tube should lay on his or her left side. The tip of the tube may be against the stomach wall, blocking fluid flow. Changing positions may move the tip away from the stomach wall. After repositioning, try injecting the water again.  Do  not use a syringe smaller than 60 mL to flush the tubing.  Do not use excessive force to overcome resistance because this could cause the tube to rupture. 8. After injecting the water, remove the syringe. 9. Always flush before giving the first medicine, between medicines, and after the final medicine before starting a feeding. This prevents medicines from clogging the tube.  Do not mix medicines with formula or with other medicines before giving medicines.  Thoroughly flush medicines through the tube so they do not mix with formula. 10. Throw away used supplies. 11. Remove gloves. 12. Wash  hands. Document Released: 10/23/2005 Document Revised: 10/09/2012 Document Reviewed: 06/06/2012 [facility] Patient Information 2014 [place], [place]. Gastrostomy Tube, Adult A gastrostomy tube is a tube that is placed into the stomach. It is also called a "G-tube." This tube is used for:  Feeding.  Giving medication. CLEANING THE G-TUBE SITE  Wash your hands with soap and water.  Remove the old dressing (if any). Some styles of G-tubes may need a dressing inserted between the skin and the G-tube. Other types of G-tubes do not require a dressing. Ask your caregiver if a dressing is needed.  Check the area where the tube enters the skin (insertion site) for redness, swelling, or pus-like (purulent) drainage. A small amount of clear or tan liquid drainage is normal. Check to make sure scar tissue (skin) is not growing around the insertion site. This could have a raised, bumpy appearance.  A cotton swab can be used to clean the skin around the tube:  When the G-tube is first put in, a normal saline solution or water can be used to clean the skin.  Mild soap and warm water can be used when the skin around the G-tube site has healed.  Roll the cotton swab around the G-tube insertion site to remove any drainage or crusting at the insertion site. RESIDUALS Feeding tube residuals are the amount of liquids that are in the stomach at any given time. Residuals may be checked before giving feedings, medications, or as instructed by your caregiver.  Ask your caregiver if there are instances when you would not start tube feedings depending on the amount or type of contents withdrawn from the stomach.  Check residuals by attaching a syringe to the G-tube and pull back on the syringe plunger. Note the amount and return the residual back into the stomach. FLUSHING THE G-TUBE  The G-tube should be periodically flushed with clean warm water to keep it from clogging.  Flush the G-tube after feedings or  medications. Draw up 30 mLs of warm water in a syringe. Connect the syringe to the G-tube and slowly push the water into the tube.  Do not push feedings, medications, or flushes rapidly. Flush the G-tube gently and slowly.  Only use syringes made for G-tubes to flush medications or feedings.  Your caregiver may want the G-tube flushed more often or with more water. If this is the case, follow your caregiver's instructions. FEEDINGS Your caregiver will determine whether feedings are given as a bolus (a certain amount given at one time and at scheduled times) or whether feedings will be given continuously on a feeding pump.   Formulas should be given at room temperature.  If feedings are continuous, no more than 4 hours worth of feedings should be placed in the feeding bag. This helps prevent spoilage or accidental excess infusion.  Cover and place unused formula in the refrigerator.  If feedings are continuous, stop the  feedings when medications or flushes are given. Be sure to restart the feedings.  Feeding bags and syringes should be replaced as instructed by your caregiver. GIVING MEDICATION   In general, it is best if all medications are in a liquid form for G-tube administration. Liquid medications are less likely to clog the G-tube.  Mix the liquid medication with 30 mLs (or amount recommended by your caregiver) of warm water.  Draw up the medication into the syringe.  Attach the syringe to the G-tube and slowly push the mixture into the G-tube.  After giving the medication, draw up 30 mLs of warm water in the syringe and slowly flush the G-tube.  For pills or capsules, check with your caregiver first before crushing medications. Some pills are not effective if they are crushed. Some capsules are sustained release medications.  If appropriate, crush the pill or capsule and mix with 30 mLs of warm water. Using the syringe, slowly push the medication through the tube, then flush  the tube with another 30 mLs of tap water. G-TUBE PROBLEMS G-tube was pulled out.  Cause: May have been pulled out accidentally.  Solutions: Cover the opening with clean dressing and tape. Call your caregiver right away. The G-tube should be put in as soon as possible (within 4 hours) so the G-tube opening (tract) does not close. The G-tube needs to be put in at a healthcare setting. An X-ray needs to be done to confirm placement before the G-tube can be used again. Redness, irritation, soreness, or foul odor around the gastrostomy site.  Cause: May be caused by leakage or infection.  Solutions: Call your caregiver right away. Large amount of leakage of fluid or mucus-like liquid present (a large amount means it soaks clothing).  Cause: Many reasons could cause the G-tube to leak.  Solutions: Call your caregiver to discuss the amount of leakage. Skin or scar tissue appears to be growing where tube enters skin.   Cause: Tissue growth may develop around the insertion site if the G-tube is moved or pulled on excessively.  Solutions: Secure tube with tape so that excess movement does not occur. Call your caregiver. G-tube is clogged.  Cause: Thick formula or medication.  Solutions: Try to slowly push warm water into the tube with a large syringe. Never try to push any object into the tube to unclog it. Do not force fluid into the G-tube. If you are unable to unclog the tube, call your caregiver right away. TIPS  Head of Bed [facility]) position refers to the upright position of a person's upper body.  When giving medications or a feeding bolus, keep the HOB up as told by your caregiver. Do this during the feeding and for 1 hour after the feeding or medication administration.  If continuous feedings are being given, it is best to keep the [facility] up as told by your caregiver. When ADLs (Activities of Daily Living) are performed and the [facility] needs to be flat, be sure to turn the feeding pump off.  Restart the feeding pump when the [facility] is returned to the recommended height.  Do not pull or put tension on the tube.  To prevent fluid backflow, kink the G-tube before removing the cap or disconnecting a syringe.  Check the G-tube length every day. Measure from the insertion site to the end of the G-tube. If the length is longer than previous measurements, the tube may be coming out. Call your caregiver if you notice increasing G-tube length.  Oral care, such as brushing teeth, must be continued.  You may need to remove excess air (vent) from the G-tube. Your caregiver will tell you if this is needed.  Always call your caregiver if you have questions or problems with the G-tube. SEEK IMMEDIATE MEDICAL CARE IF:   You have severe abdominal pain, tenderness, or abdominal bloating(distension).  You have nausea or vomiting.  You are constipated or have problems moving your bowels.  The G-tube insertion site is red, swollen, has a foul smell, or has yellow or brown drainage.  You have difficulty breathing or shortness of breath.  You have a fever.  You have a large amount of feeding tube residuals.  The G-tube is clogged and cannot be flushed. MAKE SURE YOU:   Understand these instructions.  Will watch your condition.  Will get help right away if you are not doing well or get worse. Document Released: 01/01/2002 Document Revised: 01/15/2012 Document Reviewed: 02/18/2008 [facility] Patient Information 2014 [place], [place].

## 2013-09-09 NOTE — Addendum Note (Signed)
Addended by: Ernestene Mention on: 09/09/2013 05:30 PM   Modules accepted: Orders

## 2013-09-09 NOTE — Patient Instructions (Signed)
Dehydration, Adult Dehydration is when you lose more fluids from the body than you take in. Vital organs like the kidneys, brain, and heart cannot function without a proper amount of fluids and salt. Any loss of fluids from the body can cause dehydration.  CAUSES   Vomiting.  Diarrhea.  Excessive sweating.  Excessive urine output.  Fever. SYMPTOMS  Mild dehydration  Thirst.  Dry lips.  Slightly dry mouth. Moderate dehydration  Very dry mouth.  Sunken eyes.  Skin does not bounce back quickly when lightly pinched and released.  Dark urine and decreased urine production.  Decreased tear production.  Headache. Severe dehydration  Very dry mouth.  Extreme thirst.  Rapid, weak pulse (more than 100 beats per minute at rest).  Cold hands and feet.  Not able to sweat in spite of heat and temperature.  Rapid breathing.  Blue lips.  Confusion and lethargy.  Difficulty being awakened.  Minimal urine production.  No tears. DIAGNOSIS  Your caregiver will diagnose dehydration based on your symptoms and your exam. Blood and urine tests will help confirm the diagnosis. The diagnostic evaluation should also identify the cause of dehydration. TREATMENT  Treatment of mild or moderate dehydration can often be done at home by increasing the amount of fluids that you drink. It is best to drink small amounts of fluid more often. Drinking too much at one time can make vomiting worse. Refer to the home care instructions below. Severe dehydration needs to be treated at the hospital where you will probably be given intravenous (IV) fluids that contain water and electrolytes. HOME CARE INSTRUCTIONS   Ask your caregiver about specific rehydration instructions.  Drink enough fluids to keep your urine clear or pale yellow.  Drink small amounts frequently if you have nausea and vomiting.  Eat as you normally do.  Avoid:  Foods or drinks high in sugar.  Carbonated  drinks.  Juice.  Extremely hot or cold fluids.  Drinks with caffeine.  Fatty, greasy foods.  Alcohol.  Tobacco.  Overeating.  Gelatin desserts.  Wash your hands well to avoid spreading bacteria and viruses.  Only take over-the-counter or prescription medicines for pain, discomfort, or fever as directed by your caregiver.  Ask your caregiver if you should continue all prescribed and over-the-counter medicines.  Keep all follow-up appointments with your caregiver. SEEK MEDICAL CARE IF:  You have abdominal pain and it increases or stays in one area (localizes).  You have a rash, stiff neck, or severe headache.  You are irritable, sleepy, or difficult to awaken.  You are weak, dizzy, or extremely thirsty. SEEK IMMEDIATE MEDICAL CARE IF:   You are unable to keep fluids down or you get worse despite treatment.  You have frequent episodes of vomiting or diarrhea.  You have blood or green matter (bile) in your vomit.  You have blood in your stool or your stool looks black and tarry.  You have not urinated in 6 to 8 hours, or you have only urinated a small amount of very dark urine.  You have a fever.  You faint. MAKE SURE YOU:   Understand these instructions.  Will watch your condition.  Will get help right away if you are not doing well or get worse. Document Released: 10/23/2005 Document Revised: 01/15/2012 Document Reviewed: 06/12/2011 [employer] Patient Information 2014 [employer].  

## 2013-09-09 NOTE — Progress Notes (Addendum)
Patient ID: [name], female   DOB: 06-Oct-1966, 47 y.o.   MRN: [id number]  Chief Complaint  Patient presents with  . New Evaluation    eval for Peg Tube placement/ distal third esophaus Cancer    HPI [name] is a 47 y.o. female.  She is referred by Dr. Jama Flavors for placement of a feeding gastrostomy tube. Her radiation oncologist is Dr. Lonie Peak. Her gastroenterologist is Dr. Jeani Hawking.. Her primary care physician is Dr. Fleet Contras. Her psychiatrist is Dr. Betti Cruz.  This patient has had a 30-40 pound weight loss over the past 2 years. She was hospitalized in the [facility] system in September with pneumonia, hypoxemia. She was found to have bilateral pneumonitis and an esophageal mass. Endoscopic ultrasound by Dr. [name] on 08/08/2013 showed a 5 cm long mass in the distal esophagus. Biopsy showed poorly differentiated adenocarcinoma. PET scan showed primary distal esophageal GE junction tumor with involvement of celiac axis as well as periaortics and bony metastases and T7, T12 and possibly T9 and T10. MRI of the brain is negative. CT scan shows the pneumonitis and esophageal tumor.She has just completed radiation therapy to the distal esophagus and GE junction. A Port-A-Cath has been placed by radiology and she's going to start chemotherapy soon.   She still has trouble swallowing. She cannot eat any solid food. She sometimes regurgitates liquids. She basically has severe protein calorie malnutrition. Dr. Jaynie Collins as they has told me that she will never be a candidate for an esophagectomy. Chemotherapy and radiation therapy are positive.  Past history is significant for chronic pancreatitis since cholecystectomy 1993. Has had sphincterotomy and stent placement. On methadone maintenance for drug history followed by Dr. Betti Cruz. While tobacco abuse just discontinued. History Port-A-Cath. Status post treatment of Graves' disease now on replacement. Multiple sclerosis but minimally symptomatic.  Long QT syndrome. Severe protein calorie malnutrition. [facility] nutritionist has been involved.  Surgically she has had a laparoscopic cholecystectomy patient epigastric hernia repair as a child. She's had Port-A-Cath placed. She is here today with her mother. HPI  Past Medical History  Diagnosis Date  . Multiple sclerosis   . Irritable bowel syndrome   . Arthritis   . Pancreatitis   . Anxiety   . Depression   . Chronic pain   . Migraine   . PTSD (post-traumatic stress disorder)   . Methadone use   . PONV (postoperative nausea and vomiting)   . 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 Grave's disease   . hypothyroidism   . Neuromuscular disorder   . Substance abuse     Past Surgical History  Procedure Laterality Date  . Sphincterotomy    . Cholecystectomy    . Eus N/A 08/08/2013    Procedure: ESOPHAGEAL ENDOSCOPIC ULTRASOUND (EUS) RADIAL;  Surgeon: Theda Belfast, MD;  Location: [facility] END[facility];  Service: Endoscopy;  Laterality: N/A;  . 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 Hernia repair      Family History  Problem Relation Age of Onset  . Cancer Mother     ovarian, colon, breast  . Hypertension Father   . Cancer Maternal Uncle     lung  . Cancer Maternal Grandmother     lung    Social History History  Substance Use Topics  . Smoking status: Current Every Day Smoker -- 0.50 packs/day for 8 years  . Smokeless tobacco: Never  Used  . Alcohol Use: No    Allergies  Allergen Reactions  . Erythromycin Nausea And Vomiting  . Morphine And Related Other (See Comments)    whelps on arms at iv site    Current Outpatient Prescriptions  Medication Sig Dispense Refill  . Alum & Mag Hydroxide-Simeth (MAGIC MOUTHWASH W/LIDOCAINE) SOLN 1part nystatin,1part Maaloxplus,1part benadryl,3part 2%viscous lidocaine. swallow 10 mL up to QID, before meals/bedtime  480 mL  3  . busPIRone (BUSPAR)  15 MG tablet Take 15 mg by mouth 2 (two) times daily.      Marland Kitchen escitalopram (LEXAPRO) 10 MG tablet Take 30 mg by mouth daily after supper.       . esomeprazole (NEXIUM) 20 MG capsule Take 20 mg by mouth daily before breakfast.      . imipramine (TOFRANIL) 50 MG tablet Take 100 mg by mouth at bedtime.       Marland Kitchen levothyroxine (SYNTHROID, LEVOTHROID) 100 MCG tablet Take 100 mcg by mouth daily before breakfast.       . lidocaine-prilocaine (EMLA) cream Apply 1-2 hours prior to Porta-Cath accessed  30 g  2  . Melatonin 3 MG TABS Take 3-6 mg by mouth at bedtime as needed (For sleep.).       Marland Kitchen methadone (DOLOPHINE) 10 MG/5ML solution Take 94 mg by mouth every morning.       . ondansetron (ZOFRAN) 8 MG tablet Take 8 mg by mouth every 8 (eight) hours as needed for nausea.      Marland Kitchen oxyCODONE (ROXICODONE) 5 MG/5ML solution Take 5-10 mLs (5-10 mg total) by mouth every 4 (four) hours as needed for pain.  500 mL  0  . Multiple Vitamin (MULTIVITAMIN WITH MINERALS) TABS Take 1 tablet by mouth at bedtime.       . nicotine (NICODERM CQ - DOSED IN MG/24 HOURS) 14 mg/24hr patch Place 1 patch onto the skin daily.      . potassium chloride (K-DUR,KLOR-CON) 10 MEQ tablet Take 10 mEq by mouth 2 (two) times daily.        No current facility-administered medications for this visit.    Review of Systems Review of Systems  Constitutional: Positive for activity change, appetite change, fatigue and unexpected weight change. Negative for fever and chills.  HENT: Negative for congestion, hearing loss, sore throat, trouble swallowing and voice change.   Eyes: Negative for visual disturbance.  Respiratory: Positive for cough and shortness of breath. Negative for wheezing.   Cardiovascular: Negative for chest pain, palpitations and leg swelling.  Gastrointestinal: Positive for vomiting and abdominal pain. Negative for nausea, diarrhea, constipation, blood in stool, abdominal distention and anal bleeding.  Genitourinary: Negative  for hematuria, vaginal bleeding and difficulty urinating.  Musculoskeletal: Negative for arthralgias.  Skin: Positive for color change. Negative for rash and wound.  Neurological: Negative for seizures, syncope and headaches.  Hematological: Negative for adenopathy. Does not bruise/bleed easily.  Psychiatric/Behavioral: Negative for confusion.    Blood pressure 120/68, pulse 88, temperature 97.6 F (36.4 C), temperature source Temporal, resp. rate 15, height 5\' 3"  (1.6 m), weight 109 lb 9.6 oz (49.714 kg), last menstrual period 04/28/2011.  Physical Exam Physical Exam  Constitutional: She is oriented to person, place, and time. No distress.  Pale. Cachectic.  HENT:  Head: Normocephalic and atraumatic.  Nose: Nose normal.  Mouth/Throat: No oropharyngeal exudate.  Eyes: Conjunctivae and EOM are normal. Pupils are equal, round, and reactive to light. Left eye exhibits no discharge. No scleral icterus.  Neck: Neck  supple. No JVD present. No tracheal deviation present. No thyromegaly present.  No gross adenopathy.  Cardiovascular: Normal rate, regular rhythm, normal heart sounds and intact distal pulses.   No murmur heard. Pulmonary/Chest: Effort normal and breath sounds normal. No respiratory distress. She has no wheezes. She has no rales. She exhibits no tenderness.  Port-A-Cath right infraclavicular area with catheter going up into right internal jugular vein. No infection.  Abdominal: Soft. Bowel sounds are normal. She exhibits no distension and no mass. There is tenderness. There is no rebound and no guarding.  Short transverse scar right epigastrium. No hernia. Laparoscopic scars. Seems mildly diffusely tender across the upper abdomen.  Musculoskeletal: She exhibits no edema and no tenderness.  Lymphadenopathy:    She has no cervical adenopathy.  Neurological: She is alert and oriented to person, place, and time. She exhibits normal muscle tone. Coordination normal.  Skin: Skin is  warm. No rash noted. She is not diaphoretic. No erythema. No pallor.  Psychiatric: Her behavior is normal. Judgment and thought content normal.  Somewhat depressed, flattened affect.    Data Reviewed Communication with Dr. Roylene Reason [place] say. Recent cancer center notes. All imaging studies. All histopathology.  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  Severe protein calorie malnutrition  Not a candidate per Dr. Darrold Span: Mikey Bussing --  she has not seen thoracic surgeon, but has metastatic disease to bone & distant node inolvement, so is not/ will not be not candidate for esophagectomy.  Thank you  [name]   Chronic pancreatitis  Effort on methadone maintenance for drug history, Dr. Betti Cruz.  History laparoscopic cholecystectomy  History epigastric hernia repair  Recent Port-A-Cath placement  Status post treatment of Graves' disease, on replacement therapy  Multiple sclerosis, minimally symptomatic  Long QT syndrome     Plan    We'll schedule for a feeding gastrostomy tube placement under general anesthesia. This may or may not be difficult because of her previous surgeries and because of her neoplastic disease and pancreatitis history.   We will admit her to the hospital for one or 2 nights to make sure she is stable .  Involve nutritional counselor to help with tube feeding selection. We need to be sure to avoid the overfeeding syndrome.  We will check her thyroid function testing and coagulation status preop just in case  I discussed the indications, details, techniques, and numerous risk of the surgery with the patient and her mother. There were where the risk of bleeding, infection, poor wound healing, hernia, dehiscence, intra-abdominal surgical complications, susceptibility to infection and pneumonia, metabolic problems with overfeeding. They understand these issues well. All their questions  were answered. They agree with this plan.         Angelia Mould. Derrell Lolling, M.D., [facility] General and Minimally invasive Surgery Breast and Colorectal Surgery Office:   [phone] Pager:   [phone]  09/09/2013, 11:44 AM

## 2013-09-10 ENCOUNTER — Ambulatory Visit (HOSPITAL_BASED_OUTPATIENT_CLINIC_OR_DEPARTMENT_OTHER): Payer: Medicare Other

## 2013-09-10 ENCOUNTER — Other Ambulatory Visit: Payer: Medicare Other

## 2013-09-10 ENCOUNTER — Encounter: Payer: Self-pay | Admitting: *Deleted

## 2013-09-10 ENCOUNTER — Ambulatory Visit: Payer: Medicare Other

## 2013-09-10 ENCOUNTER — Telehealth (INDEPENDENT_AMBULATORY_CARE_PROVIDER_SITE_OTHER): Payer: Self-pay | Admitting: *Deleted

## 2013-09-10 ENCOUNTER — Ambulatory Visit (HOSPITAL_BASED_OUTPATIENT_CLINIC_OR_DEPARTMENT_OTHER): Payer: Medicare Other | Admitting: Oncology

## 2013-09-10 ENCOUNTER — Other Ambulatory Visit: Payer: Medicare Other | Admitting: Lab

## 2013-09-10 ENCOUNTER — Encounter: Payer: Self-pay | Admitting: Oncology

## 2013-09-10 ENCOUNTER — Other Ambulatory Visit: Payer: Self-pay | Admitting: Oncology

## 2013-09-10 VITALS — BP 136/78 | HR 87 | Temp 97.8°F

## 2013-09-10 DIAGNOSIS — C159 Malignant neoplasm of esophagus, unspecified: Secondary | ICD-10-CM

## 2013-09-10 DIAGNOSIS — E876 Hypokalemia: Secondary | ICD-10-CM

## 2013-09-10 DIAGNOSIS — E43 Unspecified severe protein-calorie malnutrition: Secondary | ICD-10-CM

## 2013-09-10 DIAGNOSIS — R112 Nausea with vomiting, unspecified: Secondary | ICD-10-CM

## 2013-09-10 MED ORDER — ONDANSETRON 8 MG/50ML IVPB (CHCC)
8.0000 mg | Freq: Once | INTRAVENOUS | Status: AC
  Administered 2013-09-10: 8 mg via INTRAVENOUS

## 2013-09-10 MED ORDER — SODIUM CHLORIDE 0.9 % IJ SOLN
10.0000 mL | INTRAMUSCULAR | Status: DC | PRN
  Administered 2013-09-10: 10 mL via INTRAVENOUS
  Filled 2013-09-10: qty 10

## 2013-09-10 MED ORDER — SODIUM CHLORIDE 0.9 % IV SOLN
INTRAVENOUS | Status: DC
  Administered 2013-09-10: 13:00:00 via INTRAVENOUS
  Filled 2013-09-10: qty 1000

## 2013-09-10 MED ORDER — HYDROMORPHONE HCL PF 4 MG/ML IJ SOLN
1.0000 mg | Freq: Once | INTRAMUSCULAR | Status: AC
  Administered 2013-09-10: 1 mg via INTRAVENOUS

## 2013-09-10 MED ORDER — ONDANSETRON 8 MG/NS 50 ML IVPB
INTRAVENOUS | Status: AC
  Filled 2013-09-10: qty 8

## 2013-09-10 MED ORDER — SODIUM CHLORIDE 0.9 % IV SOLN
Freq: Once | INTRAVENOUS | Status: AC
  Administered 2013-09-10: 15:00:00 via INTRAVENOUS

## 2013-09-10 MED ORDER — HYDROMORPHONE HCL PF 4 MG/ML IJ SOLN
INTRAMUSCULAR | Status: AC
  Filled 2013-09-10: qty 1

## 2013-09-10 MED ORDER — PANTOPRAZOLE SODIUM 40 MG IV SOLR
40.0000 mg | Freq: Once | INTRAVENOUS | Status: AC
  Administered 2013-09-10: 40 mg via INTRAVENOUS
  Filled 2013-09-10: qty 40

## 2013-09-10 MED ORDER — HEPARIN SOD (PORK) LOCK FLUSH 100 UNIT/ML IV SOLN
500.0000 [IU] | Freq: Once | INTRAVENOUS | Status: AC
  Administered 2013-09-10: 500 [IU] via INTRAVENOUS
  Filled 2013-09-10: qty 5

## 2013-09-10 MED ORDER — OXYCODONE HCL 5 MG/5ML PO SOLN: 5.0000 mg | ORAL | Status: DC | PRN

## 2013-09-10 NOTE — Progress Notes (Signed)
1703 Pt rates pain at a 6 on the pain scale of 1-10. Dr Darrold Span notified and advised pt go to ER if pain not controlled with current pain meds. Information given to patient and family members and pt verbalizes understanding of all.

## 2013-09-10 NOTE — Progress Notes (Signed)
OFFICE PROGRESS NOTE   09/10/2013   Physicians:Patrick Felix Ahmadi (PCP), Claud Kelp, Bindubal Balan, Trudie Buckler ([facility], [place], neurology), Reddy/ Dartha Lodge NP ([facility]), K.Hilty    INTERVAL HISTORY:   Patient is seen in infusion area, together with mother, as she is receiving IVF due to inability swallow adequately from esophageal obstruction with metastatic esophageal cancer. She saw Dr Derrell Lolling yesterday, soonest G tube can be accomplished will be next week. She has felt some better with IVF at this office last few days. She was able to swallow half of a protein drink and some small amounts of other liquids in last 24 hours. She is having some nausea and has vomited a couple of times. Pain in mid back is controlled with oxycodone liquid 10 ml every 4 hours; chronic methadone is liquid and she is able to swallow it. She has more soreness upper left abdomen to back, not exactly like previous pancreatitis. She has difficulty with lots of stairs in her home including bathroom upstairs, and may do better at mother's house next door. No increased SOB. No fever.  No problems with PAC.  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 Prior upper endoscopy 3 years ago was normal. Recently she developed difficulty swallowing and pain from epigastrium to LLQ. EGD by Dr [name] in [place] found a stenosed area in distal esophagus which was friable, with abnormal mucosa there and in gastric lumen. Pathology from [facility] showed adenocarcinomaHER 2 was 2+ by immunohistochemical staining, with FISH negative ([facility] addendum from 24 Sept 2014 accession # [id number]). She was hospitalized in [facility] system 9-23 thru 08-01-13 with pneumonia, with SOB, hypoxemia and dizziness, also hypokalemic. CT CAP 07-31-13  had extensive ground glass opacities in lungs with diffuse peribronchial thickening, esophageal dilatation with confluent soft tissue density involving distal esophagus and GE junction/ adjacent stomach, enlarged AP window node and prominent subcarinal node, mildly enlarged nodes retroperitoneum and root of mesentery, nodal mass 5.4 x 2.7 cm encasing celiac axis and proximal superior mesenteric artery, diffuse hepatic steatosis and bilateral adrenal hyperplasia. Endoscopic ultrasound by Dr [name] 08-08-13 documented stenosing and friable mass from 40 cm extending 5 cm into proximal stomach with maximal depth of invasion 28mm, extending beyond the adventitia; two large lymph nodes were identified, measuring 10mm, however celiac axis area could not be evaluated, this felt to be T4N2 distal esophageal/ proximal gastric tumor. MRI head 08-04-13 was unremarkable. PET 08-19-13 showed primary distal esophagus/ GE junction with involved nodes at gastrohepatic ligament and celiac axis as well as left periaortics, and bony mets T7, T11 and possibly T9 and T10. She received 30 Gy to distal esophagus/GE junction/ T9-11 by Dr Basilio Cairo from 10-20 thru 09-05-13.  Palliative chemotherapy with FOLFOX has not yet begun due to need for PEG/G tube, as swallowing has progressively worsened.   Review of systems as above, also: Not uncomfortable from constipation. No bleeding. Remainder of 10 point Review of Systems negative.  Objective:  Vital signs in last 24 hours: BP 136/78, HR 87, temp 97.8, respirations 18 not labored RA. Last weight 11-4   109.5 lbs  Alert, oriented and appropriate conversation. Changes position in infusion bed without assistance, using WC for office. Looks generally ill tho NAD, does appear better hydrated than earlier in week.    HEENT:PERRL, sclerae not icteric. Oral mucosa  More  moist without lesions, posterior pharynx clear.  Neck supple. No JVD.  Lymphatics:no cervical,suraclavicular  adenopathy Resp: clear to auscultation bilaterally  Cardio: regular rate and rhythm. No gallop. GI: soft, somewhat tender epigastrium to left without rebound, not distended, no palpable mass or organomegaly. Few bowel sounds.  Musculoskeletal/ Extremities: without pitting edema, cords, tenderness. Feet warm. Neuro: no peripheral neuropathy. Otherwise nonfocal Skin without rash, ecchymosis, petechiae Portacath-without erythema or tenderness  Lab Results: Last from 09-08-13:  Results for orders placed in visit on 09/08/13  CBC WITH DIFFERENTIAL      Result Value Range   WBC 9.0  3.9 - 10.3 10e3/uL   NEUT# 7.5 (*) 1.5 - 6.5 10e3/uL   HGB 14.1  11.6 - 15.9 g/dL   HCT 16.1  09.6 - 04.5 %   Platelets 464 (*) 145 - 400 10e3/uL   MCV 90.9  79.5 - 101.0 fL   MCH 31.3  25.1 - 34.0 pg   MCHC 34.4  31.5 - 36.0 g/dL   RBC 4.09  8.11 - 9.14 10e6/uL   RDW 14.0  11.2 - 14.5 %   lymph# 0.9  0.9 - 3.3 10e3/uL   MONO# 0.6  0.1 - 0.9 10e3/uL   Eosinophils Absolute 0.1  0.0 - 0.5 10e3/uL   Basophils Absolute 0.0  0.0 - 0.1 10e3/uL   NEUT% 83.4 (*) 38.4 - 76.8 %   LYMPH% 9.4 (*) 14.0 - 49.7 %   MONO% 6.3  0.0 - 14.0 %   EOS% 0.7  0.0 - 7.0 %   BASO% 0.2  0.0 - 2.0 %   nRBC 0  0 - 0 %  BASIC METABOLIC PANEL (CC13)      Result Value Range   Sodium 137  136 - 145 mEq/L   Potassium 3.2 (*) 3.5 - 5.1 mEq/L   Chloride 96 (*) 98 - 109 mEq/L   CO2 28  22 - 29 mEq/L   Glucose 73  70 - 140 mg/dl   BUN 8.3  7.0 - 78.2 mg/dL   Creatinine 0.5 (*) 0.6 - 1.1 mg/dL   Calcium 8.8  8.4 - 95.6 mg/dL   Anion Gap 13 (*) 3 - 11 mEq/L     Studies/Results:  No results found.  Medications: I have reviewed the patient's current medications. Oxycodone rewritten now. She has received  zofran total 18 mg, protonix 40mg , dilaudid 1 mg and potassium 20 mEq all IV in addition to 1250cc NS today.  DISCUSSION: [facility] is willing to provide daily IV NS 1 liter with 20 mEq K and IV zofran daily for next 6 days  (cannot get approval for the IV protonix at least now). Patient prefers this support at home rather than daily trips to this office between now and when G tube done. She and mother understand that if situation worsens at home tonight or later, that she should go to ED.  Following visit, I have discussed with [facility] dietician re consideration of short term TNA possibly at home via [facility], including possible refeeding electrolyte problems and length of need. [facility] dietician suggests if TNA done, this should be no >50% of goal now, with daily K and Mg checked. Just a week of TNA would not meet usual benefit criteria, tho if problems getting G tube feedings adequate quickly,  could be another 1-2 weeks of TNA after that placement. Note hypokalemia already has been a problem and history of some cardiac conduction problems. After much consideration, I am not comfortable  trying to manage TNA at home. If she has to be admitted, that would be more safe and feasible if otherwise appropriate.  Assessment/Plan:  1. 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 G tube needed for hydration, medication and nutritional support, to be done by Dr Derrell Lolling next week as first available. Plan FOLFOX after G tube placement if/when stable 2 chronic pancreatitis since cholecystectomy 1993, better in past 2 years post sphincterotomy and stents.  3. On methadone maintenance for drug history.  Managed by Dr Betti Cruz, who is aware of esophageal cancer. 4.long tobacco just DCd  5. 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 K+ supplemented with IVF again today as she is having difficulty with usual po K+ now.  12. Severe malnutrition in context of acute illness: [facility] dietician aware and  will assist with tube feedings. See discussion re TNA above, which was all after patient's visit so not shared with her here.   Patient and mother are in favor of IVF at home daily for now, and both understand that if she has more problems that she may need admission until G tube accomplished. Dr Jacinto Halim help much appreciated.    Iretha Kirley P, MD   09/10/2013, 4:21 PM

## 2013-09-10 NOTE — Progress Notes (Signed)
1325-Small amount of emesis with continued nausea.  Dr. Darrold Span notified and order received for Zofran 8mg . IV.  Given as ordered per MD.

## 2013-09-10 NOTE — Patient Instructions (Addendum)
Dehydration, Adult Dehydration means your body does not have as much fluid as it needs. Your kidneys, brain, and heart will not work properly without the right amount of fluids and salt.  HOME CARE  Ask your doctor how to replace body fluid losses (rehydrate).  Drink enough fluids to keep your pee (urine) clear or pale yellow.  Drink small amounts of fluids often if you feel sick to your stomach (nauseous) or throw up (vomit).  Eat like you normally do.  Avoid:  Foods or drinks high in sugar.  Bubbly (carbonated) drinks.  Juice.  Very hot or cold fluids.  Drinks with caffeine.  Fatty, greasy foods.  Alcohol.  Tobacco.  Eating too much.  Gelatin desserts.  Wash your hands to avoid spreading germs (bacteria, viruses).  Only take medicine as told by your doctor.  Keep all doctor visits as told. GET HELP RIGHT AWAY IF:   You cannot drink something without throwing up.  You get worse even with treatment.  Your vomit has blood in it or looks greenish.  Your poop (stool) has blood in it or looks black and tarry.  You have not peed in 6 to 8 hours.  You pee a small amount of very dark pee.  You have a fever.  You pass out (faint).  You have belly (abdominal) pain that gets worse or stays in one spot (localizes).  You have a rash, stiff neck, or bad headache.  You get easily annoyed, sleepy, or are hard to wake up.  You feel weak, dizzy, or very thirsty. MAKE SURE YOU:   Understand these instructions.  Will watch your condition.  Will get help right away if you are not doing well or get worse. Document Released: 08/19/2009 Document Revised: 01/15/2012 Document Reviewed: 06/12/2011 [facility] Patient Information 2014 [place], [place]. **Do not drive as you received IV pain medication**

## 2013-09-10 NOTE — Telephone Encounter (Signed)
error 

## 2013-09-10 NOTE — Progress Notes (Signed)
Patients father is going to drive her home due to patient receiving IV pain medication.w

## 2013-09-11 ENCOUNTER — Ambulatory Visit: Payer: Medicare Other

## 2013-09-11 ENCOUNTER — Telehealth: Payer: Self-pay | Admitting: *Deleted

## 2013-09-11 NOTE — Telephone Encounter (Signed)
Sent orders for MD to sign for IV fluids in home. Confirmed they will begin today.

## 2013-09-12 ENCOUNTER — Emergency Department (HOSPITAL_COMMUNITY): Payer: Medicare Other

## 2013-09-12 ENCOUNTER — Ambulatory Visit: Payer: Medicare Other

## 2013-09-12 ENCOUNTER — Inpatient Hospital Stay (HOSPITAL_COMMUNITY)
Admission: EM | Admit: 2013-09-12 | Discharge: 2013-09-20 | DRG: 374 | Disposition: A | Discharge status: Home / Self Care | Payer: Medicare Other | Attending: Internal Medicine | Admitting: Internal Medicine

## 2013-09-12 ENCOUNTER — Other Ambulatory Visit: Payer: Self-pay

## 2013-09-12 ENCOUNTER — Encounter (HOSPITAL_COMMUNITY): Payer: Self-pay | Admitting: Pharmacy Technician

## 2013-09-12 ENCOUNTER — Encounter (HOSPITAL_COMMUNITY): Payer: Self-pay | Admitting: Emergency Medicine

## 2013-09-12 DIAGNOSIS — C159 Malignant neoplasm of esophagus, unspecified: Secondary | ICD-10-CM

## 2013-09-12 DIAGNOSIS — C7951 Secondary malignant neoplasm of bone: Secondary | ICD-10-CM

## 2013-09-12 DIAGNOSIS — IMO0002 Reserved for concepts with insufficient information to code with codable children: Secondary | ICD-10-CM

## 2013-09-12 DIAGNOSIS — E46 Unspecified protein-calorie malnutrition: Secondary | ICD-10-CM

## 2013-09-12 DIAGNOSIS — R64 Cachexia: Secondary | ICD-10-CM | POA: Diagnosis present

## 2013-09-12 DIAGNOSIS — Z79899 Other long term (current) drug therapy: Secondary | ICD-10-CM

## 2013-09-12 DIAGNOSIS — Z66 Do not resuscitate: Secondary | ICD-10-CM | POA: Diagnosis not present

## 2013-09-12 DIAGNOSIS — K859 Acute pancreatitis without necrosis or infection, unspecified: Secondary | ICD-10-CM

## 2013-09-12 DIAGNOSIS — E860 Dehydration: Secondary | ICD-10-CM

## 2013-09-12 DIAGNOSIS — R110 Nausea: Secondary | ICD-10-CM

## 2013-09-12 DIAGNOSIS — K5900 Constipation, unspecified: Secondary | ICD-10-CM | POA: Diagnosis not present

## 2013-09-12 DIAGNOSIS — R634 Abnormal weight loss: Secondary | ICD-10-CM | POA: Diagnosis present

## 2013-09-12 DIAGNOSIS — F1120 Opioid dependence, uncomplicated: Secondary | ICD-10-CM

## 2013-09-12 DIAGNOSIS — E43 Unspecified severe protein-calorie malnutrition: Secondary | ICD-10-CM

## 2013-09-12 DIAGNOSIS — F329 Major depressive disorder, single episode, unspecified: Secondary | ICD-10-CM | POA: Diagnosis present

## 2013-09-12 DIAGNOSIS — R112 Nausea with vomiting, unspecified: Secondary | ICD-10-CM

## 2013-09-12 DIAGNOSIS — J189 Pneumonia, unspecified organism: Secondary | ICD-10-CM

## 2013-09-12 DIAGNOSIS — E876 Hypokalemia: Secondary | ICD-10-CM

## 2013-09-12 DIAGNOSIS — K589 Irritable bowel syndrome without diarrhea: Secondary | ICD-10-CM | POA: Diagnosis present

## 2013-09-12 DIAGNOSIS — C155 Malignant neoplasm of lower third of esophagus: Principal | ICD-10-CM

## 2013-09-12 DIAGNOSIS — G43909 Migraine, unspecified, not intractable, without status migrainosus: Secondary | ICD-10-CM | POA: Diagnosis present

## 2013-09-12 DIAGNOSIS — F17200 Nicotine dependence, unspecified, uncomplicated: Secondary | ICD-10-CM | POA: Diagnosis present

## 2013-09-12 DIAGNOSIS — F411 Generalized anxiety disorder: Secondary | ICD-10-CM | POA: Diagnosis present

## 2013-09-12 DIAGNOSIS — K861 Other chronic pancreatitis: Secondary | ICD-10-CM | POA: Diagnosis present

## 2013-09-12 DIAGNOSIS — Z8501 Personal history of malignant neoplasm of esophagus: Secondary | ICD-10-CM

## 2013-09-12 DIAGNOSIS — F4310 Post-traumatic stress disorder, unspecified: Secondary | ICD-10-CM | POA: Diagnosis present

## 2013-09-12 DIAGNOSIS — R52 Pain, unspecified: Secondary | ICD-10-CM

## 2013-09-12 DIAGNOSIS — E278 Other specified disorders of adrenal gland: Secondary | ICD-10-CM | POA: Diagnosis present

## 2013-09-12 DIAGNOSIS — R079 Chest pain, unspecified: Secondary | ICD-10-CM

## 2013-09-12 DIAGNOSIS — C801 Malignant (primary) neoplasm, unspecified: Secondary | ICD-10-CM

## 2013-09-12 DIAGNOSIS — G893 Neoplasm related pain (acute) (chronic): Secondary | ICD-10-CM | POA: Diagnosis present

## 2013-09-12 DIAGNOSIS — K834 Spasm of sphincter of Oddi: Secondary | ICD-10-CM

## 2013-09-12 DIAGNOSIS — M129 Arthropathy, unspecified: Secondary | ICD-10-CM | POA: Diagnosis present

## 2013-09-12 DIAGNOSIS — Z809 Family history of malignant neoplasm, unspecified: Secondary | ICD-10-CM

## 2013-09-12 DIAGNOSIS — F3289 Other specified depressive episodes: Secondary | ICD-10-CM | POA: Diagnosis present

## 2013-09-12 DIAGNOSIS — R627 Adult failure to thrive: Secondary | ICD-10-CM | POA: Diagnosis present

## 2013-09-12 DIAGNOSIS — E039 Hypothyroidism, unspecified: Secondary | ICD-10-CM | POA: Diagnosis present

## 2013-09-12 DIAGNOSIS — G35 Multiple sclerosis: Secondary | ICD-10-CM | POA: Diagnosis present

## 2013-09-12 DIAGNOSIS — R1310 Dysphagia, unspecified: Secondary | ICD-10-CM | POA: Diagnosis present

## 2013-09-12 DIAGNOSIS — E0500 Thyrotoxicosis with diffuse goiter without thyrotoxic crisis or storm: Secondary | ICD-10-CM

## 2013-09-12 LAB — COMPREHENSIVE METABOLIC PANEL
ALT: 21 U/L (ref 0–35)
AST: 52 U/L — ABNORMAL HIGH (ref 0–37)
Alkaline Phosphatase: 133 U/L — ABNORMAL HIGH (ref 39–117)
BUN: 3 mg/dL — ABNORMAL LOW (ref 6–23)
CO2: 24 mEq/L (ref 19–32)
GFR calc Af Amer: >90 mL/min (ref >90)
GFR calc non Af Amer: >90 mL/min (ref >90)
Glucose, Bld: 69 mg/dL — ABNORMAL LOW (ref 70–99)
Potassium: 3.7 mEq/L (ref 3.5–5.1)
Total Protein: 6.2 g/dL (ref 6.0–8.3)

## 2013-09-12 LAB — CBC WITH DIFFERENTIAL/PLATELET
Basophils Absolute: 0 10*3/uL (ref 0.0–0.1)
Basophils Relative: 0 % (ref 0–1)
HCT: 40.1 % (ref 36.0–46.0)
Lymphocytes Relative: 10 % — ABNORMAL LOW (ref 12–46)
Lymphs Abs: 0.7 10*3/uL (ref 0.7–4.0)
MCHC: 34.9 g/dL (ref 30.0–36.0)
Monocytes Absolute: 0.4 10*3/uL (ref 0.1–1.0)
Neutro Abs: 5.8 10*3/uL (ref 1.7–7.7)
Neutrophils Relative %: 82 % — ABNORMAL HIGH (ref 43–77)
Platelets: 386 10*3/uL (ref 150–400)
RBC: 4.41 MIL/uL (ref 3.87–5.11)
RDW: 14.3 % (ref 11.5–15.5)
WBC: 7.2 10*3/uL (ref 4.0–10.5)

## 2013-09-12 LAB — LIPASE, BLOOD: Lipase: 22 U/L (ref 11–59)

## 2013-09-12 LAB — TSH: TSH: 27.01 u[IU]/mL — ABNORMAL HIGH (ref 0.350–4.500)

## 2013-09-12 MED ORDER — ONDANSETRON HCL 4 MG/2ML IJ SOLN
4.0000 mg | Freq: Four times a day (QID) | INTRAMUSCULAR | Status: DC | PRN
  Administered 2013-09-12 – 2013-09-16 (×7): 4 mg via INTRAVENOUS
  Filled 2013-09-12 (×7): qty 2

## 2013-09-12 MED ORDER — SODIUM CHLORIDE 0.9 % IJ SOLN
3.0000 mL | Freq: Two times a day (BID) | INTRAMUSCULAR | Status: DC
  Administered 2013-09-13 – 2013-09-16 (×6): 3 mL via INTRAVENOUS

## 2013-09-12 MED ORDER — HYDROMORPHONE HCL PF 2 MG/ML IJ SOLN
2.0000 mg | INTRAMUSCULAR | Status: DC | PRN
  Administered 2013-09-12: 2 mg via INTRAVENOUS
  Filled 2013-09-12: qty 1

## 2013-09-12 MED ORDER — HYDROMORPHONE HCL PF 1 MG/ML IJ SOLN: 1.0000 mg | INTRAMUSCULAR | Status: DC | PRN

## 2013-09-12 MED ORDER — HYDROMORPHONE HCL PF 1 MG/ML IJ SOLN
2.0000 mg | INTRAMUSCULAR | Status: DC | PRN
  Administered 2013-09-12 – 2013-09-15 (×15): 2 mg via INTRAVENOUS
  Administered 2013-09-15: 1 mg via INTRAVENOUS
  Administered 2013-09-15 – 2013-09-19 (×22): 2 mg via INTRAVENOUS
  Filled 2013-09-12 (×39): qty 2

## 2013-09-12 MED ORDER — ONDANSETRON HCL 4 MG/2ML IJ SOLN: 4.0000 mg | Freq: Three times a day (TID) | INTRAMUSCULAR | Status: DC | PRN

## 2013-09-12 MED ORDER — ONDANSETRON HCL 4 MG PO TABS
4.0000 mg | ORAL_TABLET | Freq: Four times a day (QID) | ORAL | Status: DC | PRN
  Administered 2013-09-15 (×2): 4 mg via ORAL
  Filled 2013-09-12 (×2): qty 1

## 2013-09-12 MED ORDER — HYDROMORPHONE HCL PF 1 MG/ML IJ SOLN
1.0000 mg | Freq: Once | INTRAMUSCULAR | Status: AC
  Administered 2013-09-12: 1 mg via INTRAVENOUS
  Filled 2013-09-12: qty 1

## 2013-09-12 MED ORDER — SODIUM CHLORIDE 0.9 % IV BOLUS (SEPSIS)
1000.0000 mL | Freq: Once | INTRAVENOUS | Status: AC
  Administered 2013-09-12: 1000 mL via INTRAVENOUS

## 2013-09-12 MED ORDER — ACETAMINOPHEN 325 MG PO TABS: 650.0000 mg | ORAL_TABLET | Freq: Four times a day (QID) | ORAL | Status: DC | PRN

## 2013-09-12 MED ORDER — ONDANSETRON HCL 4 MG/2ML IJ SOLN
4.0000 mg | INTRAMUSCULAR | Status: AC
  Administered 2013-09-12: 4 mg via INTRAVENOUS
  Filled 2013-09-12: qty 2

## 2013-09-12 MED ORDER — SODIUM CHLORIDE 0.9 % IV SOLN
INTRAVENOUS | Status: DC
  Administered 2013-09-12 – 2013-09-14 (×4): via INTRAVENOUS

## 2013-09-12 MED ORDER — LEVOTHYROXINE SODIUM 100 MCG IV SOLR
50.0000 ug | Freq: Every day | INTRAVENOUS | Status: DC
  Administered 2013-09-12: 17:00:00 50 ug via INTRAVENOUS
  Filled 2013-09-12 (×2): qty 5

## 2013-09-12 MED ORDER — ACETAMINOPHEN 650 MG RE SUPP: 650.0000 mg | Freq: Four times a day (QID) | RECTAL | Status: DC | PRN

## 2013-09-12 MED ORDER — ENOXAPARIN SODIUM 40 MG/0.4ML ~~LOC~~ SOLN
40.0000 mg | SUBCUTANEOUS | Status: DC
  Administered 2013-09-12: 17:00:00 40 mg via SUBCUTANEOUS
  Filled 2013-09-12: qty 0.4

## 2013-09-12 MED ORDER — PANTOPRAZOLE SODIUM 40 MG IV SOLR
40.0000 mg | INTRAVENOUS | Status: DC
  Administered 2013-09-12 – 2013-09-18 (×7): 40 mg via INTRAVENOUS
  Filled 2013-09-12 (×9): qty 40

## 2013-09-12 NOTE — Progress Notes (Signed)
Unit CM UR Completed by [facility] ED CM  W. Emmett Bracknell RN  

## 2013-09-12 NOTE — ED Notes (Signed)
Pt being transported to Radiology Department at this time 

## 2013-09-12 NOTE — Consult Note (Signed)
[facility] CONSULT NOTE  Patient Care Team: [name], MD as PCP - General (Internal Medicine)  CHIEF COMPLAINTS/PURPOSE OF CONSULTATION:  T4N2M1 metastatic esophageal cancer admitted with uncontrolled pain, nausea, dehydration   HISTORY OF PRESENTING ILLNESS:  [name] 47 y.o. female is here because of uncontrolled pain, nausea, dehydration and severe malnutrition. She is a patient of Dr. Darrold Span.  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 Prior upper endoscopy 3 years ago was normal. Recently she developed difficulty swallowing and pain from epigastrium to LLQ. EGD by Dr [name] in [place] found a stenosed area in distal esophagus which was friable, with abnormal mucosa there and in gastric lumen. Pathology from [facility] showed adenocarcinomaHER 2 was 2+ by immunohistochemical staining, with FISH negative ([facility] addendum from 24 Sept 2014 accession # [id number]). She was hospitalized in [facility] system 9-23 thru 08-01-13 with pneumonia, with SOB, hypoxemia and dizziness, also hypokalemic. CT CAP 07-31-13 had extensive ground glass opacities in lungs with diffuse peribronchial thickening, esophageal dilatation with confluent soft tissue density involving distal esophagus and GE junction/ adjacent stomach, enlarged AP window node and prominent subcarinal node, mildly enlarged nodes retroperitoneum and root of mesentery, nodal mass 5.4 x 2.7 cm encasing celiac axis and proximal superior mesenteric artery, diffuse hepatic steatosis and bilateral adrenal hyperplasia. Endoscopic ultrasound by Dr [name] 08-08-13 documented stenosing and friable mass from 40 cm extending 5 cm into proximal stomach with maximal depth of invasion 28mm, extending beyond the adventitia; two large lymph nodes were identified,  measuring 10mm, however celiac axis area could not be evaluated, this felt to be T4N2 distal esophageal/ proximal gastric tumor. MRI head 08-04-13 was unremarkable. PET 08-19-13 showed primary distal esophagus/ GE junction with involved nodes at gastrohepatic ligament and celiac axis as well as left periaortics, and bony mets T7, T11 and possibly T9 and T10. She received 30 Gy to distal esophagus/GE junction/ T9-11 by Dr Basilio Cairo from 10-20 thru 09-05-13.  Palliative chemotherapy with FOLFOX has not yet begun due to need for PEG, as swallowing has progressively worsened.  Interval history  The patient was dependent on about 96 mg methadone per day and oxycodone 10 mg every 4 hours/prn for pain. Pain is located in the epigastric region with radiation to the back. She felt the current pain regimen is not working. She had difficulties swallowing food, able to tolerate small about of liquid diet. She has regular nausea and vomiting daily. She has lost an estimated 40 pounds of weight in 1 month.  Placement of feeding tube is pending. The patient denies any recent signs or symptoms of bleeding such as spontaneous epistaxis, hematuria or hematochezia. She denies any recent fever, chills, night sweats. No cough  MEDICAL HISTORY:  Past Medical History  Diagnosis Date  . Multiple sclerosis   . Irritable bowel syndrome   . Arthritis   . Pancreatitis   . Anxiety   . Depression   . Chronic pain   . Migraine   . PTSD (post-traumatic stress disorder)   . Methadone use   . PONV (postoperative nausea and vomiting)   . 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 Grave's disease   . hypothyroidism   . Neuromuscular disorder   . Substance abuse     SURGICAL HISTORY: Past Surgical History  Procedure Laterality Date  .  Sphincterotomy    . Cholecystectomy    . Eus N/A 08/08/2013    Procedure: ESOPHAGEAL ENDOSCOPIC ULTRASOUND (EUS) RADIAL;  Surgeon: Theda Belfast, MD;  Location: [facility];   Service: Endoscopy;  Laterality: N/A;  . 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 Hernia repair      SOCIAL HISTORY: History   Social History  . Marital Status: Divorced    Spouse Name: N/A    Number of Children: N/A  . Years of Education: N/A   Occupational History  .      former nurse   Social History Main Topics  . Smoking status: Current Every Day Smoker -- 0.50 packs/day for 8 years  . Smokeless tobacco: Never Used  . Alcohol Use: No  . Drug Use: No  . Sexual Activity: Yes    Birth Control/ Protection: Condom   Other Topics Concern  . Not on file   Social History Narrative  . No narrative on file    FAMILY HISTORY: Family History  Problem Relation Age of Onset  . Cancer Mother     ovarian, colon, breast  . Hypertension Father   . Cancer Maternal Uncle     lung  . Cancer Maternal Grandmother     lung    ALLERGIES:  is allergic to erythromycin and morphine and related.  MEDICATIONS:  Current Facility-Administered Medications  Medication Dose Route Frequency Provider Last Rate Last Dose  . 0.9 %  sodium chloride infusion   Intravenous Continuous [name], MD      . acetaminophen (TYLENOL) tablet 650 mg  650 mg Oral Q6H PRN [name], MD       Or  . acetaminophen (TYLENOL) suppository 650 mg  650 mg Rectal Q6H PRN [name], MD      . HYDROmorphone (DILAUDID) injection 2 mg  2 mg Intravenous Q3H PRN [name], MD   2 mg at 09/12/13 1602  . levothyroxine (SYNTHROID, LEVOTHROID) injection 50 mcg  50 mcg Intravenous Daily [name], MD      . ondansetron [facility]) tablet 4 mg  4 mg Oral Q6H PRN [name], MD       Or  . ondansetron (ZOFRAN) injection 4 mg  4 mg Intravenous Q6H PRN [name], MD      . sodium chloride 0.9 % injection 3 mL  3 mL Intravenous Q12H [name], MD        REVIEW OF SYSTEMS:   Constitutional: Denies fevers, chills   Eyes: Denies blurriness of vision, double vision or watery eyes Ears, nose, mouth, throat, and face: Denies mucositis or sore throat Respiratory: Denies cough, dyspnea or wheezes Cardiovascular: Denies palpitation, chest discomfort or lower extremity swelling Skin: Denies abnormal skin rashes Lymphatics: Denies new lymphadenopathy or easy bruising Neurological:Denies numbness, tingling or new weaknesses Behavioral/Psych: Mood is stable, no new changes  All other systems were reviewed with the patient and are negative.  PHYSICAL EXAMINATION: ECOG PERFORMANCE STATUS: 2 - Symptomatic, <50% confined to bed  Filed Vitals:   09/12/13 1653  BP: 143/88  Pulse: 79  Temp: 97.5 F (36.4 C)  Resp: 20   Filed Weights   09/12/13 1054 09/12/13 1653  Weight: 108 lb (48.988 kg) 109 lb (49.442 kg)    GENERAL:alert, no distress and comfortable. She looked thin and cachetic. SKIN: skin color, texture, turgor are normal, no rashes or significant lesions EYES: normal, conjunctiva are pink  and non-injected, sclera clear OROPHARYNX:no exudate, no erythema and lips, buccal mucosa, and tongue normal  NECK: supple, thyroid normal size, non-tender, without nodularity LYMPH:  no palpable lymphadenopathy in the cervical, axillary or inguinal LUNGS: clear to auscultation and percussion with normal breathing effort HEART: regular rate & rhythm and no murmurs and no lower extremity edema ABDOMEN:abdomen soft,tenderness on gentle palpation in the epigastrium with no rebound. Musculoskeletal:no cyanosis of digits and no clubbing  PSYCH: alert & oriented x 3 with fluent speech NEURO: no focal motor/sensory deficits  LABORATORY DATA:  I have reviewed the data as listed Lab Results  Component Value Date   WBC 7.2 09/12/2013   HGB 14.0 09/12/2013   HCT 40.1 09/12/2013   MCV 90.9 09/12/2013   PLT 386 09/12/2013    RADIOGRAPHIC STUDIES: I have personally reviewed the radiological images as listed and agreed  with the findings in the report. Dg Chest 2 View  09/12/2013   CLINICAL DATA:  Body pain, cough  EXAM: CHEST  2 VIEW  COMPARISON:  CT chest 07/31/2013  FINDINGS: Clear lungs. Normal heart and mediastinum. Unremarkable osseous structures. Right-sided Port-A-Cath with the tip projecting over the proximal IVC.  IMPRESSION: No active cardiopulmonary disease.   Electronically Signed   By: [name]   On: 09/12/2013 12:02   Nm Pet Image Initial (pi) Skull Base To Thigh  08/19/2013   CLINICAL DATA:  Initial treatment strategy for staging of esophageal cancer.  EXAM: NUCLEAR MEDICINE PET SKULL BASE TO THIGH  FASTING BLOOD GLUCOSE:  Value:  103 mg/dl  TECHNIQUE: 09.8 MCi J-19 FDG was injected intravenously. CT data was obtained and used for attenuation correction and anatomic localization only. (This was not acquired as a diagnostic CT examination.) Additional exam technical data entered on technologist worksheet.  COMPARISON:  07/31/2013 chest, abdomen, and pelvic CTs  FINDINGS: NECK  Left low jugular/supraclavicular nodal metastasis. This measures 1.3 cm and a S.U.V. max of 13.3 on image 60/series 2.  CHEST  Distal esophageal/ proximal gastric primary. This measures a S.U.V. max of 16.1 on image 125/series 2.  ABDOMEN/PELVIS  Extensive nodal metastasis within a gastrohepatic ligament and surrounding the celiac origin. Index nodal mass measures 2.5 cm and a S.U.V. max of 13.5 on image 139.  Hypermetabolic adenopathy continues along the left periaortic station of the retroperitoneum.  Ascending colonic hypermetabolism is favored to be physiologic, including on image 184/series 2.  SKELETON  Multiple osseous metastasis. Lesions are identified within T11 and left-sided T7. Possible T9 and T10 lesions. These are relatively CT occult, without gross epidural tumor or canal encroachment.  CT IMAGES PERFORMED FOR ATTENUATION CORRECTION  No further findings within the neck.  Chest, abdomen, and pelvic findings deferred to  recent diagnostic CTs. The gastroesophageal junction primary is likely partially obstructive. Fluid and contrast are seen within the lower thoracic esophagus which is mildly dilated. The prevascular nodes described on prior exam are decreased in size and not hypermetabolic.  Cholecystectomy. Minimal pneumobilia. Hepatic steatosis.  IMPRESSION: 1. Gastroesophageal junction primary with upper abdominal and low cervical nodal metastasis. 2. Osseous metastasis.   Electronically Signed   By: Jeronimo Greaves M.D.   On: 08/19/2013 17:18   Ir Fluoro Guide Cv Line Right  08/22/2013   CLINICAL DATA:  47 year old with esophageal cancer.  EXAM: FLUOROSCOPIC AND ULTRASOUND GUIDED PLACEMENT OF A SUBCUTANEOUS PORT.  Physician: Rachelle Hora. Lowella Dandy, MD  MEDICATIONS AND MEDICAL HISTORY: Versed 4 mg, fentanyl 200 mcg. Ancef 2 g. Zofran 4 mg. A  radiology nurse monitored the patient for moderate sedation. As antibiotic prophylaxis, Ancef was ordered pre-procedure and administered intravenously within one hour of incision.  ANESTHESIA/SEDATION: Moderate sedation time: 30 minutes  FLUOROSCOPY TIME:  30 seconds  PROCEDURE: The risks of the procedure were explained to the patient. Informed consent was obtained. Patient was placed supine on the interventional table. Ultrasound confirmed a patent right internal jugular vein. The right chest and neck were cleaned with a skin antiseptic and a sterile drape was placed. Maximal barrier sterile technique was utilized including caps, mask, sterile gowns, sterile gloves, sterile drape, hand hygiene and skin antiseptic. The right neck was anesthetized with 1% lidocaine. Small incision was made in the right neck with a blade. Micropuncture set was placed in the right internal jugular vein with ultrasound guidance. The micropuncture wire was used for measurement purposes. The right chest was anesthetized with 1% lidocaine with epinephrine. #15 blade was used to make an incision and a subcutaneous port pocket  was formed. 8 french Power Port was assembled. Subcutaneous tunnel was formed with a stiff tunneling device. The port catheter was brought through the subcutaneous tunnel. The port was placed in the subcutaneous pocket. The micropuncture set was exchanged for a peel-away sheath. The catheter was placed through the peel-away sheath and the tip was positioned in the superior vena cava. Catheter placement was confirmed with fluoroscopy. The port was accessed and flushed with heparinized saline. The port pocket was closed using two layers of absorbable sutures and Dermabond. The vein skin site was closed using a single layer of absorbable suture and Dermabond. Sterile dressings were applied. Patient tolerated the procedure well without an immediate complication. Ultrasound and fluoroscopic images were taken and saved for this procedure.  COMPLICATIONS: None  IMPRESSION: Placement of a subcutaneous port device. The catheter tip is in the superior vena cava and ready to be used.   Electronically Signed   By: Richarda Overlie M.D.   On: 08/22/2013 15:50   Ir US Guide Vasc Access Right  08/22/2013   CLINICAL DATA:  47 year old with esophageal cancer.  EXAM: FLUOROSCOPIC AND ULTRASOUND GUIDED PLACEMENT OF A SUBCUTANEOUS PORT.  Physician: Rachelle Hora. Lowella Dandy, MD  MEDICATIONS AND MEDICAL HISTORY: Versed 4 mg, fentanyl 200 mcg. Ancef 2 g. Zofran 4 mg. A radiology nurse monitored the patient for moderate sedation. As antibiotic prophylaxis, Ancef was ordered pre-procedure and administered intravenously within one hour of incision.  ANESTHESIA/SEDATION: Moderate sedation time: 30 minutes  FLUOROSCOPY TIME:  30 seconds  PROCEDURE: The risks of the procedure were explained to the patient. Informed consent was obtained. Patient was placed supine on the interventional table. Ultrasound confirmed a patent right internal jugular vein. The right chest and neck were cleaned with a skin antiseptic and a sterile drape was placed. Maximal barrier  sterile technique was utilized including caps, mask, sterile gowns, sterile gloves, sterile drape, hand hygiene and skin antiseptic. The right neck was anesthetized with 1% lidocaine. Small incision was made in the right neck with a blade. Micropuncture set was placed in the right internal jugular vein with ultrasound guidance. The micropuncture wire was used for measurement purposes. The right chest was anesthetized with 1% lidocaine with epinephrine. #15 blade was used to make an incision and a subcutaneous port pocket was formed. 8 french Power Port was assembled. Subcutaneous tunnel was formed with a stiff tunneling device. The port catheter was brought through the subcutaneous tunnel. The port was placed in the subcutaneous pocket. The micropuncture set was exchanged  for a peel-away sheath. The catheter was placed through the peel-away sheath and the tip was positioned in the superior vena cava. Catheter placement was confirmed with fluoroscopy. The port was accessed and flushed with heparinized saline. The port pocket was closed using two layers of absorbable sutures and Dermabond. The vein skin site was closed using a single layer of absorbable suture and Dermabond. Sterile dressings were applied. Patient tolerated the procedure well without an immediate complication. Ultrasound and fluoroscopic images were taken and saved for this procedure.  COMPLICATIONS: None  IMPRESSION: Placement of a subcutaneous port device. The catheter tip is in the superior vena cava and ready to be used.   Electronically Signed   By: Richarda Overlie M.D.   On: 08/22/2013 15:50    ASSESSMENT:  Metastatic esophageal cancer with uncontrolled pain, nausea & dehydration   PLAN:  #1 metastatic esophageal cancer Defer chemotherapy to out-patient. Continue supportive care  #2 Nausea IV anti-emetics as needed along with IV fluids #3 Uncontrolled pain Consider palliative care consult for pain management. Continue oral methadone,  oxycodone and prn IV pain medicine per primary service #4 Malnutrition Continue small amount of oral intake. Awaiting general surgery input timing of feeding tube. Consult nutrition service while hospitalized.  Dr. Darrold Span will return to see patient next week. Please do not hesitate to call on-call service if questions arise.  All questions were answered. The patient knows to call the clinic with any problems, questions or concerns.    Shimon Trowbridge, MD @T @ 4:59 PM

## 2013-09-12 NOTE — ED Provider Notes (Signed)
CSN: [id number]     Arrival date & time 09/12/13  1041 History   First MD Initiated Contact with Patient 09/12/13 1110     Chief Complaint  Patient presents with  . Abdominal Pain  . Nausea  . Emesis   (Consider location/radiation/quality/duration/timing/severity/associated sxs/prior Treatment) The history is provided by the patient and medical records.   This is a 47 year old female with past medical history significant for a MS, recurrent pancreatitis, metastatic esophageal adenocarcinoma, Graves' disease, presenting to the ED for abdominal pain, nausea, vomiting x1 week. According to medical records, patient's distal esophagus is almost completely occluded, she is scheduled for PEG tube placement on 09/17/2013.  Patient is currently on a total liquid diet, but has been unable to tolerate for the past week. States she has been having some epigastric and left upper quadrant abdominal pain with radiation to her back.  Has been taking her home pain medications but states she has only been able to partially keep them down.  Has been unable to rest comfortably because of this.  Denies any diarrhea or urinary sx.  Pt notes she was recently hospitalized for CAP but states her breathing does not feel back to baseline.  Denies any recent sick contacts, fevers, sweats, or chills.  Past Medical History  Diagnosis Date  . Multiple sclerosis   . Irritable bowel syndrome   . Arthritis   . Pancreatitis   . Anxiety   . Depression   . Chronic pain   . Migraine   . PTSD (post-traumatic stress disorder)   . Methadone use   . PONV (postoperative nausea and vomiting)   . 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 Grave's disease   . hypothyroidism   . Neuromuscular disorder   . Substance abuse    Past Surgical History  Procedure Laterality Date  . Sphincterotomy    . Cholecystectomy    . Eus N/A 08/08/2013    Procedure: ESOPHAGEAL ENDOSCOPIC ULTRASOUND (EUS) RADIAL;  Surgeon: Theda Belfast, MD;  Location: [facility];  Service: Endoscopy;  Laterality: N/A;  . 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 Hernia repair     Family History  Problem Relation Age of Onset  . Cancer Mother     ovarian, colon, breast  . Hypertension Father   . Cancer Maternal Uncle     lung  . Cancer Maternal Grandmother     lung   History  Substance Use Topics  . Smoking status: Current Every Day Smoker -- 0.50 packs/day for 8 years  . Smokeless tobacco: Never Used  . Alcohol Use: No   OB History   Grav Para Term Preterm Abortions TAB SAB Ect Mult Living                 Review of Systems  Gastrointestinal: Positive for nausea, vomiting and abdominal pain.  All other systems reviewed and are negative.    Allergies  Erythromycin and Morphine and related  Home Medications   Current Outpatient Rx  Name  Route  Sig  Dispense  Refill  . Alum & Mag Hydroxide-Simeth (MAGIC MOUTHWASH W/LIDOCAINE) SOLN      1part nystatin,1part Maaloxplus,1part benadryl,3part 2%viscous lidocaine. swallow 10 mL up to QID, before meals/bedtime   480 mL   3   . busPIRone (BUSPAR) 15 MG tablet   Oral   Take 15 mg by mouth 2 (two) times  daily.         . escitalopram (LEXAPRO) 10 MG tablet   Oral   Take 30 mg by mouth daily after supper.          . esomeprazole (NEXIUM) 20 MG capsule   Oral   Take 20 mg by mouth daily before breakfast.         . imipramine (TOFRANIL) 50 MG tablet   Oral   Take 100 mg by mouth at bedtime.          Marland Kitchen levothyroxine (SYNTHROID, LEVOTHROID) 100 MCG tablet   Oral   Take 100 mcg by mouth daily before breakfast.          . lidocaine-prilocaine (EMLA) cream      Apply 1-2 hours prior to Porta-Cath accessed   30 g   2   . Melatonin 3 MG TABS   Oral   Take 3-6 mg by mouth at bedtime as needed (For sleep.).          Marland Kitchen methadone (DOLOPHINE) 10 MG/5ML solution   Oral   Take  94 mg by mouth every morning.          . Multiple Vitamin (MULTIVITAMIN WITH MINERALS) TABS   Oral   Take 1 tablet by mouth at bedtime.          . nicotine (NICODERM CQ - DOSED IN MG/24 HOURS) 14 mg/24hr patch   Transdermal   Place 1 patch onto the skin daily.         . ondansetron (ZOFRAN) 8 MG tablet   Oral   Take 8 mg by mouth every 8 (eight) hours as needed for nausea.         Marland Kitchen oxyCODONE (ROXICODONE) 5 MG/5ML solution   Oral   Take 5-10 mLs (5-10 mg total) by mouth every 4 (four) hours as needed.   500 mL   0   . potassium chloride (K-DUR,KLOR-CON) 10 MEQ tablet   Oral   Take 10 mEq by mouth 2 (two) times daily.           BP 127/78  Pulse 97  Temp(Src) 97.5 F (36.4 C) (Oral)  Resp 22  Ht 5\' 3"  (1.6 m)  Wt 108 lb (48.988 kg)  BMI 19.14 kg/m2  SpO2 100%  LMP 04/28/2011  Physical Exam  Nursing note and vitals reviewed. Constitutional: She is oriented to person, place, and time. She appears well-developed and well-nourished. No distress.  Appears weak and uncomfortable  HENT:  Head: Normocephalic and atraumatic.  Mouth/Throat: Oropharynx is clear and moist.  Handling secretions appropriately  Eyes: Conjunctivae and EOM are normal. Pupils are equal, round, and reactive to light.  Neck: Normal range of motion.  Cardiovascular: Normal rate, regular rhythm and normal heart sounds.   Pulmonary/Chest: Effort normal and breath sounds normal. No respiratory distress. She has no wheezes.  Abdominal: Soft. Bowel sounds are normal. There is tenderness in the epigastric area and left upper quadrant. There is no rigidity, no CVA tenderness, no tenderness at McBurney's point and negative Murphy's sign.  Abdomen soft, non-distended, epigastric and LUQ TTP  Musculoskeletal: Normal range of motion.  Neurological: She is alert and oriented to person, place, and time.  Skin: Skin is warm and dry. She is not diaphoretic.  Psychiatric: She has a normal mood and affect.     ED Course  Procedures (including critical care time) Labs Review Labs Reviewed  COMPREHENSIVE METABOLIC PANEL - Abnormal; Notable for the following:  Sodium 132 (*)    Glucose, Bld 69 (*)    BUN 3 (*)    Creatinine, Ser 0.41 (*)    Calcium 8.2 (*)    Albumin 2.4 (*)    AST 52 (*)    Alkaline Phosphatase 133 (*)    All other components within normal limits  CBC WITH DIFFERENTIAL - Abnormal; Notable for the following:    Neutrophils Relative % 82 (*)    Lymphocytes Relative 10 (*)    All other components within normal limits  LIPASE, BLOOD  URINALYSIS, ROUTINE W REFLEX MICROSCOPIC   Imaging Review No results found.  EKG Interpretation   None       MDM   1. Carcinoma of distal third of esophagus   2. 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 Nausea and vomiting   4. Pancreatitis   5. Protein-calorie malnutrition, severe   6. Nausea & vomiting    Pt overall appears weak and uncomfortable.  Labs as above, no evidence of pancreatitis.  Sx midly improved with IVF, pain meds, and anti-emetics.  Due to pts poor current state of health, will plan admission to rehydration and pain control.  Discussed with hospitalist, Dr. Vanessa Barbara, pt will be admitted to [facility], med-surg.  Temp admit orders placed.  VS stable.  Garlon Hatchet, PA-C 09/12/13 1658

## 2013-09-12 NOTE — Progress Notes (Signed)
09/12/13 Patient was a admit from ED from home with kids. Dx of Pancreatitis, Hx of Esophagus CA, IV site4 ib rt chest implanted port. Diet NPO, IV fluids NS 11ml/hr.

## 2013-09-12 NOTE — ED Notes (Signed)
Patient has a port. IV team paged for access.

## 2013-09-12 NOTE — ED Notes (Signed)
Pt c/o abdominal pain with n/v x 1 week. Pt on liquid diet at present, but cannot tolerate.

## 2013-09-12 NOTE — Progress Notes (Addendum)
09/12/13 1530 call placed to ED for report, Nurse unable to answer phone will call back when able.Placed another call to ED at 1600 still no callback.

## 2013-09-12 NOTE — Progress Notes (Signed)
Patient ID: [name], female   DOB: 09-Jan-1966, 47 y.o.   MRN: [id number]  She has already been consulted on by Dr. Derrell Lolling who has had a discussion with her regarding an open G-tube and she is on the schedule for him to do at [facility] this coming Wednesday. I will notify him of her admission.

## 2013-09-12 NOTE — H&P (Signed)
[facility] History and Physical  [name] MVH:[id number] DOB: [birth year] DOA: 09/12/2013  Referring physician:  PCP: [name], MD  Specialists: Medical oncology  Chief Complaint: Unable to keep by mouth down  HPI: [name] is a 47 y.o. female with a past medical history metastatic adenocarcinoma of distal esophagus who currently follows with Dr. Jama Flavors of her medical oncology at the [facility]. She was recently seen by Dr. Darrold Span on 09/10/2013 at which time G-tube placement was discussed. The patient has progressively developed an inability to swallow adequately do to the esophageal obstruction caused by metastatic esophageal cancer. She was scheduled to undergo G tube placement by Dr. Derrell Lolling of general surgery next week on Wednesday. In the meantime has been receiving IV fluids at home through home health services. Up to this point she was able to hold down protein boost and small amounts of other liquids. However in the last 24-48 hours if she has been unable to hold anything down stating that all liquids have come back up. She denies fevers chills chest pain shortness of breath diarrhea dysuria hematuria. She is not presently on antimicrobial therapy. She does complain of chronic abdominal pain located in the epigastric and left upper quadrant region appear.                                                                         Review of Systems: The patient denies anorexia, fever, weight loss,, vision loss, decreased hearing, hoarseness, chest pain, syncope, dyspnea on exertion, peripheral edema, balance deficits, hemoptysis, abdominal pain, melena, hematochezia, severe indigestion/heartburn, hematuria, incontinence, genital sores, muscle weakness, suspicious skin lesions, transient blindness, difficulty walking, depression, unusual weight change, abnormal bleeding, enlarged lymph nodes, angioedema, and breast masses.    Past Medical History  Diagnosis Date  .  Multiple sclerosis   . Irritable bowel syndrome   . Arthritis   . Pancreatitis   . Anxiety   . Depression   . Chronic pain   . Migraine   . PTSD (post-traumatic stress disorder)   . Methadone use   . PONV (postoperative nausea and vomiting)   . 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 Grave's disease   . hypothyroidism   . Neuromuscular disorder   . Substance abuse    Past Surgical History  Procedure Laterality Date  . Sphincterotomy    . Cholecystectomy    . Eus N/A 08/08/2013    Procedure: ESOPHAGEAL ENDOSCOPIC ULTRASOUND (EUS) RADIAL;  Surgeon: Theda Belfast, MD;  Location: [facility];  Service: Endoscopy;  Laterality: N/A;  . 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 Hernia repair     Social History:  reports that she has been smoking.  She has never used smokeless tobacco. She reports that she does not drink alcohol or use illicit drugs.   Allergies  Allergen Reactions  . Erythromycin Nausea And Vomiting  . Morphine And Related Other (See Comments)    whelps on arms at iv site    Family History  Problem Relation Age of Onset  . Cancer Mother     ovarian, colon, breast  . Hypertension Father   .  Cancer Maternal Uncle     lung  . Cancer Maternal Grandmother     lung    Prior to Admission medications   Medication Sig Start Date End Date Taking? Authorizing Provider  Alum & Mag Hydroxide-Simeth (MAGIC MOUTHWASH W/LIDOCAINE) SOLN 1part nystatin,1part Maaloxplus,1part benadryl,3part 2%viscous lidocaine. swallow 10 mL up to QID, before meals/bedtime 09/05/13  Yes Lonie Peak, MD  busPIRone (BUSPAR) 15 MG tablet Take 15 mg by mouth 2 (two) times daily.   Yes Historical Provider, MD  escitalopram (LEXAPRO) 10 MG tablet Take 30 mg by mouth daily after supper.    Yes Historical Provider, MD  esomeprazole (NEXIUM) 20 MG capsule Take 20 mg by mouth daily before breakfast.   Yes  Historical Provider, MD  imipramine (TOFRANIL) 50 MG tablet Take 100 mg by mouth at bedtime.    Yes Historical Provider, MD  levothyroxine (SYNTHROID, LEVOTHROID) 100 MCG tablet Take 100 mcg by mouth daily before breakfast.    Yes Historical Provider, MD  lidocaine-prilocaine (EMLA) cream Apply 1-2 hours prior to Porta-Cath accessed 09/03/13  Yes Lennis P Livesay, MD  Melatonin 3 MG TABS Take 3-6 mg by mouth at bedtime as needed (For sleep.).    Yes Historical Provider, MD  methadone (DOLOPHINE) 10 MG/5ML solution Take 94 mg by mouth every morning.    Yes Historical Provider, MD  Multiple Vitamin (MULTIVITAMIN WITH MINERALS) TABS Take 1 tablet by mouth at bedtime.    Yes Historical Provider, MD  nicotine (NICODERM CQ - DOSED IN MG/24 HOURS) 14 mg/24hr patch Place 1 patch onto the skin daily.   Yes Historical Provider, MD  ondansetron (ZOFRAN) 8 MG tablet Take 8 mg by mouth every 8 (eight) hours as needed for nausea.   Yes Theda Belfast, MD  oxyCODONE (ROXICODONE) 5 MG/5ML solution Take 5-10 mLs (5-10 mg total) by mouth every 4 (four) hours as needed. 09/10/13  Yes Lennis Buzzy Han, MD  potassium chloride (K-DUR,KLOR-CON) 10 MEQ tablet Take 10 mEq by mouth 2 (two) times daily.    Yes Historical Provider, MD   Physical Exam: Filed Vitals:   09/12/13 1545  BP: 158/85  Pulse: 82  Temp:   Resp:      General:  Cachectic, ill-appearing, in no acute distress  Eyes: Pupils are equal round reactive to light no sclera icterus was noted, extraocular movement is intact  Neck: Neck is supple symmetrical flat jugular veins, no nuchal rigidity noted  Cardiovascular: Regular rate and rhythm normal S1-S2 no murmurs rubs or gallops  Respiratory: Lungs clear to auscultation bilaterally no wheezing rhonchi or rales  Abdomen: Patient having some pain to palpation across her epigastric region as well as left upper quadrant. There is no rebound tenderness guarding or peritoneal sign  Skin: No rashes or  lesions noted  Musculoskeletal: No edema, preserved range of motion of all extremity  Psychiatric: Patient is awake alert and oriented x3  Neurologic: Cranial nerves II through XII are grossly intact ulceration to his incision global 5 of 5 muscle  Labs on Admission:  Basic Metabolic Panel:  Recent Labs Lab 09/08/13 1412 09/12/13 1305  NA 137 132*  K 3.2* 3.7  CL  --  96  CO2 28 24  GLUCOSE 73 69*  BUN 8.3 3*  CREATININE 0.5* 0.41*  CALCIUM 8.8 8.2*   Liver Function Tests:  Recent Labs Lab 09/12/13 1305  AST 52*  ALT 21  ALKPHOS 133*  BILITOT 0.3  PROT 6.2  ALBUMIN 2.4*  Recent Labs Lab 09/12/13 1305  LIPASE 22   No results found for this basename: AMMONIA,  in the last 168 hours CBC:  Recent Labs Lab 09/08/13 1410 09/12/13 1305  WBC 9.0 7.2  NEUTROABS 7.5* 5.8  HGB 14.1 14.0  HCT 41.0 40.1  MCV 90.9 90.9  PLT 464* 386   Cardiac Enzymes: No results found for this basename: CKTOTAL, CKMB, CKMBINDEX, TROPONINI,  in the last 168 hours  BNP (last 3 results)  Recent Labs  07/29/13 2104  PROBNP 74.9   CBG: No results found for this basename: GLUCAP,  in the last 168 hours  Radiological Exams on Admission: Dg Chest 2 View  09/12/2013   CLINICAL DATA:  Body pain, cough  EXAM: CHEST  2 VIEW  COMPARISON:  CT chest 07/31/2013  FINDINGS: Clear lungs. Normal heart and mediastinum. Unremarkable osseous structures. Right-sided Port-A-Cath with the tip projecting over the proximal IVC.  IMPRESSION: No active cardiopulmonary disease.   Electronically Signed   By: [name]   On: 09/12/2013 12:02      Assessment/Plan Active Problems:   Pancreatitis   Graves' disease   Esophageal cancer   Carcinoma of distal third of esophagus   Metastatic carcinoma to bone   Protein-calorie malnutrition, severe   1. Metastatic esophageal cancer. Patient with a history of metastatic esophageal cancer, following Dr. Darrold Span of medical oncology at the cancer  Center. G-tube placement have been discussed on the recent followup visit. Plan for FOLFOX therapy after G-tube placement if stable. Patient now presenting with inability to take by mouth due to obstructive effect from esophageal cancer. I discussed case with general surgery for G-tube placement during this hospitalization. I have also consulted medical oncology. Meanwhile will continue IV fluid resuscitation with normal saline. Will make patient n.p.o., hold all oral medications for now or convert to IV if possible. Await further recommendations from surgery and medical oncology. 2. History of Graves' disease. Status post treatment, on thyroid replacement therapy. Will continue Synthroid at 50 mcg IV every 24 hours. Will check a TSH 3. Hyponatremia. Patient presenting with a sodium of 132, I suspect secondary to dehydration. Receiving IV fluid resuscitation.  4. History of long QT syndrome. Will check an EKG 5. Chronic pain syndrome. Will provide IV Dilantin every 3 hours as needed for severe breakthrough pain. 6. Severe malnutrition. Plan for G-tube placement with initiation of tube feeds. 7. DVT prophylaxis. Lovenox    Code Status: Full code Family Communication: Plan discussed with patient Disposition Plan: Will admit patient to telemetry, and his facial require greater than 2 night hospitalization  Time spent: 70 min  [name] [facility] Pager [phone]  If 7PM-7AM, please contact night-coverage [email or web address] Password [facility] 09/12/2013, 3:57 PM

## 2013-09-12 NOTE — ED Notes (Signed)
Phlebotomy called. Patients port will not draw blood back but flushes without any difficulty.

## 2013-09-13 DIAGNOSIS — E0500 Thyrotoxicosis with diffuse goiter without thyrotoxic crisis or storm: Secondary | ICD-10-CM

## 2013-09-13 DIAGNOSIS — E876 Hypokalemia: Secondary | ICD-10-CM

## 2013-09-13 DIAGNOSIS — R079 Chest pain, unspecified: Secondary | ICD-10-CM

## 2013-09-13 DIAGNOSIS — K834 Spasm of sphincter of Oddi: Secondary | ICD-10-CM

## 2013-09-13 DIAGNOSIS — C159 Malignant neoplasm of esophagus, unspecified: Secondary | ICD-10-CM

## 2013-09-13 LAB — COMPREHENSIVE METABOLIC PANEL
AST: 40 U/L — ABNORMAL HIGH (ref 0–37)
Albumin: 2.3 g/dL — ABNORMAL LOW (ref 3.5–5.2)
Alkaline Phosphatase: 123 U/L — ABNORMAL HIGH (ref 39–117)
BUN: <3 mg/dL — ABNORMAL LOW (ref 6–23)
CO2: 24 mEq/L (ref 19–32)
Chloride: 95 mEq/L — ABNORMAL LOW (ref 96–112)
Creatinine, Ser: 0.4 mg/dL — ABNORMAL LOW (ref 0.50–1.10)
GFR calc non Af Amer: >90 mL/min (ref >90)
Potassium: 2.9 mEq/L — ABNORMAL LOW (ref 3.5–5.1)
Sodium: 133 mEq/L — ABNORMAL LOW (ref 135–145)
Total Bilirubin: 0.3 mg/dL (ref 0.3–1.2)

## 2013-09-13 LAB — CBC
HCT: 37.2 % (ref 36.0–46.0)
MCV: 90.7 fL (ref 78.0–100.0)
RBC: 4.1 MIL/uL (ref 3.87–5.11)
RDW: 14.2 % (ref 11.5–15.5)
WBC: 5.1 10*3/uL (ref 4.0–10.5)

## 2013-09-13 LAB — GLUCOSE, CAPILLARY
Glucose-Capillary: 50 mg/dL — ABNORMAL LOW (ref 70–99)
Glucose-Capillary: 54 mg/dL — ABNORMAL LOW (ref 70–99)

## 2013-09-13 LAB — PHOSPHORUS: Phosphorus: 2.4 mg/dL (ref 2.3–4.6)

## 2013-09-13 MED ORDER — INSULIN ASPART 100 UNIT/ML ~~LOC~~ SOLN
0.0000 [IU] | SUBCUTANEOUS | Status: DC
  Administered 2013-09-14 – 2013-09-15 (×5): 1 [IU] via SUBCUTANEOUS

## 2013-09-13 MED ORDER — METHADONE HCL 5 MG PO TABS
30.0000 mg | ORAL_TABLET | Freq: Three times a day (TID) | ORAL | Status: DC
  Administered 2013-09-13 – 2013-09-14 (×3): 30 mg via ORAL
  Filled 2013-09-13 (×3): qty 6

## 2013-09-13 MED ORDER — POTASSIUM CHLORIDE 10 MEQ/100ML IV SOLN
10.0000 meq | INTRAVENOUS | Status: AC
  Administered 2013-09-13 (×6): 10 meq via INTRAVENOUS
  Filled 2013-09-13 (×6): qty 100

## 2013-09-13 MED ORDER — MAGNESIUM SULFATE IN D5W 10-5 MG/ML-% IV SOLN
1.0000 g | Freq: Once | INTRAVENOUS | Status: AC
  Administered 2013-09-13: 13:00:00 1 g via INTRAVENOUS
  Filled 2013-09-13 (×2): qty 100

## 2013-09-13 MED ORDER — FAT EMULSION 20 % IV EMUL
240.0000 mL | INTRAVENOUS | Status: AC
  Administered 2013-09-14: 240 mL via INTRAVENOUS
  Filled 2013-09-13: qty 250

## 2013-09-13 MED ORDER — METHADONE HCL 40 MG PO TBSO: 40.0000 mg | ORAL_TABLET | Freq: Two times a day (BID) | ORAL | Status: DC

## 2013-09-13 MED ORDER — METHADONE HCL 10 MG/ML PO CONC: 30.0000 mg | Freq: Three times a day (TID) | ORAL | Status: DC

## 2013-09-13 MED ORDER — MAGNESIUM SULFATE IN D5W 10-5 MG/ML-% IV SOLN
1.0000 g | Freq: Once | INTRAVENOUS | Status: AC
Start: 2013-09-13 — End: 2013-09-13
  Administered 2013-09-13: 1 g via INTRAVENOUS
  Filled 2013-09-13: qty 100

## 2013-09-13 MED ORDER — NICOTINE 21 MG/24HR TD PT24
21.0000 mg | MEDICATED_PATCH | Freq: Every day | TRANSDERMAL | Status: DC
  Administered 2013-09-13 – 2013-09-20 (×8): 21 mg via TRANSDERMAL
  Filled 2013-09-13 (×8): qty 1

## 2013-09-13 MED ORDER — HEPARIN SODIUM (PORCINE) 5000 UNIT/ML IJ SOLN
5000.0000 [IU] | Freq: Three times a day (TID) | INTRAMUSCULAR | Status: DC
  Administered 2013-09-13 – 2013-09-15 (×8): 5000 [IU] via SUBCUTANEOUS
  Filled 2013-09-13 (×12): qty 1

## 2013-09-13 MED ORDER — LEVOTHYROXINE SODIUM 100 MCG IV SOLR
75.0000 ug | Freq: Every day | INTRAVENOUS | Status: DC
  Administered 2013-09-13 – 2013-09-18 (×6): 75 ug via INTRAVENOUS
  Filled 2013-09-13 (×7): qty 5

## 2013-09-13 MED ORDER — TRACE MINERALS CR-CU-F-FE-I-MN-MO-SE-ZN IV SOLN
INTRAVENOUS | Status: AC
  Filled 2013-09-13: qty 1000

## 2013-09-13 MED ORDER — SODIUM CHLORIDE 0.9 % IJ SOLN
10.0000 mL | INTRAMUSCULAR | Status: DC | PRN
  Administered 2013-09-14: 05:00:00 10 mL

## 2013-09-13 NOTE — Progress Notes (Addendum)
[facility]                                                                                Patient Demographics  [name], is a 47 y.o. female, DOB - 1966-01-30, ZOX:[id number]  Admit date - 09/12/2013   Admitting Physician [name], MD  Outpatient Primary MD for the patient is [name], MD  LOS - 1   Chief Complaint  Patient presents with  . Abdominal Pain  . Nausea  . 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 - Causing chronic left upper quadrant abdominal pain and dysphagia, unable to take proper diet and now has severe protein calorie malnutrition with failure to thrive. She will be started on TPN dose by pharmacy via her right-sided Port-A-Cath, general surgery is following the patient and they have scheduled her for a G-tube placement procedure on 09/17/2013. Supportive care till then. Continue chronic methadone along with short-acting narcotics for breakthrough pain, liquid diet for pleasure and comfort with aspiration precautions. She follows with Dr. Darrold Span for her oncology problems, oncology group has also been consulted.    2. 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 No acute issues. Lipase stable.    3.History of Graves' disease. Status post treatment, on thyroid replacement therapy. Will continue Synthroid at 75 mcg IV every 24 hours which is higher than her home dose since TSH is elevated and as oral intake is unreliable.   Lab Results  Component Value Date   TSH 27.010* 09/12/2013   Repeat TSH in a few days.    4. Mildly prolonged QT on EKG. No dysrhythmia on telemetry. Continue telemetry monitoring, IV magnesium given. We'll monitor electrolytes closely    5. Chronic pain syndrome. Continue home dose methadone and short-acting narcotics  for breakthrough.    6. Hypokalemia. Aggressively replaced IV. We'll give her magnesium one dose also and monitor electrolytes closely      7. History of MS. Outpatient followup with neurologist post discharge. No acute issues.     Code Status: Full, long term prognosis is poor.  Family Communication:    Disposition Plan: Home   Procedures due for G-tube placement by general surgery   Consults  [facility], oncology   Medications  Scheduled Meds: . levothyroxine  50 mcg Intravenous Daily  . methadone  30 mg Oral Q8H  . nicotine  21 mg Transdermal Daily  . pantoprazole (PROTONIX) IV  40 mg Intravenous Q24H  . potassium chloride  10 mEq Intravenous Q1 Hr x 6  . sodium chloride  3 mL Intravenous Q12H   Continuous Infusions: . sodium chloride 100 mL/hr at 09/13/13 0310   PRN Meds:.acetaminophen, acetaminophen, HYDROmorphone (DILAUDID) injection, ondansetron (ZOFRAN) IV, ondansetron  DVT Prophylaxis    Heparin   Lab Results  Component Value Date   PLT 355 09/13/2013    Antibiotics    Anti-infectives   None          Subjective:   [name]  Widener today has, No headache, No chest pain, No abdominal pain - No Nausea, No new weakness tingling or numbness, No Cough - SOB.   Objective:   Filed Vitals:   09/12/13 1630 09/12/13 1653 09/12/13 2310 09/13/13 0512  BP: 138/92 143/88 123/80 144/80  Pulse: 82 79 76 79  Temp:  97.5 F (36.4 C) 97.7 F (36.5 C) 98.5 F (36.9 C)  TempSrc:  Oral Oral Oral  Resp:  20 20 20   Height:  5\' 3"  (1.6 m)    Weight:  49.442 kg (109 lb)    SpO2: 96% 98% 99% 97%    Wt Readings from Last 3 Encounters:  09/12/13 49.442 kg (109 lb)  09/09/13 49.714 kg (109 lb 9.6 oz)  09/03/13 49.351 kg (108 lb 12.8 oz)     Intake/Output Summary (Last 24 hours) at 09/13/13 0933 Last data filed at 09/13/13 0720  Gross per 24 hour  Intake 1421.66 ml  Output      1 ml  Net 1420.66 ml    Exam Awake Alert, Oriented X 3, No new F.N deficits,  Normal affect .AT,PERRAL Supple Neck,No JVD, No cervical lymphadenopathy appriciated.  Symmetrical Chest wall movement, Good air movement bilaterally, CTAB RRR,No Gallops,Rubs or new Murmurs, No Parasternal Heave +ve B.Sounds, Abd Soft, Non tender, No organomegaly appriciated, No rebound - guarding or rigidity. No Cyanosis, Clubbing or edema, No new Rash or bruise      Data Review   Micro Results No results found for this or any previous visit (from the past 240 hour(s)).  Radiology Reports Dg Chest 2 View  09/12/2013   CLINICAL DATA:  Body pain, cough  EXAM: CHEST  2 VIEW  COMPARISON:  CT chest 07/31/2013  FINDINGS: Clear lungs. Normal heart and mediastinum. Unremarkable osseous structures. Right-sided Port-A-Cath with the tip projecting over the proximal IVC.  IMPRESSION: No active cardiopulmonary disease.   Electronically Signed   By: [name]   On: 09/12/2013 12:02         CBC  Recent Labs Lab 09/08/13 1410 09/12/13 1305 09/13/13 0400  WBC 9.0 7.2 5.1  HGB 14.1 14.0 13.1  HCT 41.0 40.1 37.2  PLT 464* 386 355  MCV 90.9 90.9 90.7  MCH 31.3 31.7 32.0  MCHC 34.4 34.9 35.2  RDW 14.0 14.3 14.2  LYMPHSABS 0.9 0.7  --   MONOABS 0.6 0.4  --   EOSABS 0.1 0.2  --   BASOSABS 0.0 0.0  --     Chemistries   Recent Labs Lab 09/08/13 1412 09/12/13 1305 09/13/13 0400  NA 137 132* 133*  K 3.2* 3.7 2.9*  CL  --  96 95*  CO2 28 24 24   GLUCOSE 73 69* 51*  BUN 8.3 3* <3*  CREATININE 0.5* 0.41* 0.40*  CALCIUM 8.8 8.2* 7.8*  AST  --  52* 40*  ALT  --  21 17  ALKPHOS  --  133* 123*  BILITOT  --  0.3 0.3   ------------------------------------------------------------------------------------------------------------------ estimated creatinine clearance is 68.5 ml/min (by C-G formula based on Cr of 0.4). ------------------------------------------------------------------------------------------------------------------ No results found for this basename: HGBA1C,  in the  last 72 hours ------------------------------------------------------------------------------------------------------------------ No results found for this basename: CHOL, HDL, LDLCALC, TRIG, CHOLHDL, LDLDIRECT,  in the last 72 hours ------------------------------------------------------------------------------------------------------------------  Recent Labs  09/12/13 1540  TSH 27.010*   ------------------------------------------------------------------------------------------------------------------ No results found for this basename: VITAMINB12, FOLATE, FERRITIN, TIBC, IRON, RETICCTPCT,  in the last 72 hours  Coagulation profile No results found for this  basename: INR, PROTIME,  in the last 168 hours  No results found for this basename: DDIMER,  in the last 72 hours  Cardiac Enzymes No results found for this basename: CK, CKMB, TROPONINI, MYOGLOBIN,  in the last 168 hours ------------------------------------------------------------------------------------------------------------------ No components found with this basename: POCBNP,      Time Spent in minutes 35   Susa Raring K M.D on 09/13/2013 at 9:33 AM  Between 7am to 7pm - Pager - [phone]  After 7pm go to [email or web address] - password [email or web address]  And look for the night coverage person covering for me after hours  [facility] Office  [phone]

## 2013-09-13 NOTE — Progress Notes (Signed)
INITIAL NUTRITION ASSESSMENT  DOCUMENTATION CODES Per approved criteria  -Severe malnutrition in the context of chronic illness   INTERVENTION:  1. Initiate TPN 11/8, slow advancement due to refeeding risk. Once PEG placed and TF tolerance established can wean. Management per Pharmacy.   2. Once PEG is ready for use recommend:  Initiate Osmolite 1.2 1/2 can QID Increase by 1/2 can at each feeding daily until pt reaches goal of 1.5 cans QID.  (if pt no longer at refeeding risk) TF provides: 1710 kcal, 80 grams protein, and  ml H2O  3. 60 ml H2O flush before and after each feeding, additional 150 ml H2O flush daily. Total free water 1800 ml.  NUTRITION DIAGNOSIS: Malnutrition related to cancer as evidenced by 16% weight loss x 1 month, intake of </= 75% of needs for >/= 1 month, and severe fat and muscle wasting.   Goal: Pt to meet >/= 90% of their estimated nutrition needs   Monitor:  TF initiation and tolerance, weight trend, labs   Reason for Assessment: MD Consult   47 y.o. female  Admitting Dx: <principal problem not specified>  ASSESSMENT: Pt with newly dx of esophagea cancer in 07/2013. Pt with 16% weight loss x 1 month due to inability to take foods/liquids due to esophageal mass. Pt was planned to have PEG Wed 11/12 however pt admitted because she was unable to keep anything down by mouth.  Potassium low and being repleted IV.  Per pt she tries to drink a premier protein shake that has 30 grams protein in it but is lucky to get one down per day.  Reviewed TF with pt who had no questions.  Consult received from MD for assessment with plans for PEG Wed 11/12. Spoke with [facility], unable to move pt up on the list for PEG placement, therefore will start TPN as pt not likely to get any nutrition until Thursday. Spoke with Pharmacy who will begin nutrition at a low rate and monitor for refeeding. Plan to utilize TPN until enteral nutrition tolerance is established.   Nutrition  Focused Physical Exam:  Subcutaneous Fat:  Orbital Region: mild-moderate wasting Upper Arm Region: mild-moderate wasting Thoracic and Lumbar Region: severe wasting  Muscle:  Temple Region: severe wasting Clavicle Bone Region: severe wasting Clavicle and Acromion Bone Region: severe wasting Scapular Bone Region: severe wasting Dorsal Hand: mild-moderate wasting Patellar Region: mild-moderate wasting Anterior Thigh Region: mild-moderate wasting Posterior Calf Region: mild-moderate wasting  Edema: not present   Height: Ht Readings from Last 1 Encounters:  09/12/13 5\' 3"  (1.6 m)    Weight: Wt Readings from Last 1 Encounters:  09/12/13 109 lb (49.442 kg)    Ideal Body Weight: 52.2 kg   % Ideal Body Weight: 95%  Wt Readings from Last 10 Encounters:  09/12/13 109 lb (49.442 kg)  09/09/13 109 lb 9.6 oz (49.714 kg)  09/03/13 108 lb 12.8 oz (49.351 kg)  09/01/13 108 lb 6.4 oz (49.17 kg)  08/25/13 112 lb 3.2 oz (50.894 kg)  08/25/13 111 lb 12.8 oz (50.712 kg)  08/18/13 119 lb 9.6 oz (54.25 kg)  08/13/13 119 lb 12.8 oz (54.341 kg)  08/08/13 130 lb (58.968 kg)  08/08/13 130 lb (58.968 kg)    Usual Body Weight: 130 lb   % Usual Body Weight: 84%  BMI:  Body mass index is 19.31 kg/(m^2).  Estimated Nutritional Needs: Kcal: 1600-1800 Protein: 80-100 Fluid: > 1.8 L/day  Skin: no issues noted  Diet Order: NPO  EDUCATION NEEDS: -No  education needs identified at this time   Intake/Output Summary (Last 24 hours) at 09/13/13 0926 Last data filed at 09/13/13 0720  Gross per 24 hour  Intake 1421.66 ml  Output      1 ml  Net 1420.66 ml    Last BM: PTA   Labs:   Recent Labs Lab 09/08/13 1412 09/12/13 1305 09/13/13 0400  NA 137 132* 133*  K 3.2* 3.7 2.9*  CL  --  96 95*  CO2 28 24 24   BUN 8.3 3* <3*  CREATININE 0.5* 0.41* 0.40*  CALCIUM 8.8 8.2* 7.8*  GLUCOSE 73 69* 51*   Phosphorus: 2.4 Magnesium: 1.7  CBG (last 3)  No results found for this  basename: GLUCAP,  in the last 72 hours  Scheduled Meds: . levothyroxine  50 mcg Intravenous Daily  . methadone  30 mg Oral Q8H  . nicotine  21 mg Transdermal Daily  . pantoprazole (PROTONIX) IV  40 mg Intravenous Q24H  . potassium chloride  10 mEq Intravenous Q1 Hr x 6  . sodium chloride  3 mL Intravenous Q12H    Continuous Infusions: . sodium chloride 100 mL/hr at 09/13/13 0310    Past Medical History  Diagnosis Date  . Multiple sclerosis   . Irritable bowel syndrome   . Arthritis   . Pancreatitis   . Anxiety   . Depression   . Chronic pain   . Migraine   . PTSD (post-traumatic stress disorder)   . Methadone use   . PONV (postoperative nausea and vomiting)   . 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 Grave's disease   . hypothyroidism   . Neuromuscular disorder   . Substance abuse     Past Surgical History  Procedure Laterality Date  . Sphincterotomy    . Cholecystectomy    . Eus N/A 08/08/2013    Procedure: ESOPHAGEAL ENDOSCOPIC ULTRASOUND (EUS) RADIAL;  Surgeon: Theda Belfast, MD;  Location: [facility];  Service: Endoscopy;  Laterality: N/A;  . 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 Hernia repair      Kendell Bane RD, LDN, CNSC [phone] Pager [phone] After Hours Pager

## 2013-09-13 NOTE — Progress Notes (Addendum)
09/13/13  1645 Patient given coke to drink, takes sips. CBG  retaken at  1730 at 70.!930 CBG redone at 71 after eating a little of her meal and coke to drink.

## 2013-09-13 NOTE — Progress Notes (Addendum)
PARENTERAL NUTRITION CONSULT NOTE - INITIAL  Pharmacy Consult for TNA Indication: dysphagia, severe malnutrition related to esophageal cancer  Allergies  Allergen Reactions  . Erythromycin Nausea And Vomiting  . Morphine And Related Other (See Comments)    whelps on arms at iv site    Patient Measurements: Height: 5\' 3"  (160 cm) Weight: 109 lb (49.442 kg) IBW/kg (Calculated) : 52.4   Vital Signs: Temp: 98.5 F (36.9 C) (11/08 0512) Temp src: Oral (11/08 0512) BP: 144/80 mmHg (11/08 0512) Pulse Rate: 79 (11/08 0512) Intake/Output from previous day: 11/07 0701 - 11/08 0700 In: 103.3 [I.V.:103.3] Out: 1 [Urine:1] Intake/Output from this shift: Total I/O In: 1318.3 [I.V.:1318.3] Out: -   Labs:  Recent Labs  09/12/13 1305 09/13/13 0400  WBC 7.2 5.1  HGB 14.0 13.1  HCT 40.1 37.2  PLT 386 355     Recent Labs  09/12/13 1305 09/13/13 0400  NA 132* 133*  K 3.7 2.9*  CL 96 95*  CO2 24 24  GLUCOSE 69* 51*  BUN 3* <3*  CREATININE 0.41* 0.40*  CALCIUM 8.2* 7.8*  PROT 6.2 5.5*  ALBUMIN 2.4* 2.3*  AST 52* 40*  ALT 21 17  ALKPHOS 133* 123*  BILITOT 0.3 0.3   Estimated Creatinine Clearance: 68.5 ml/min (by C-G formula based on Cr of 0.4).   No results found for this basename: GLUCAP,  in the last 72 hours  Medical History: Past Medical History  Diagnosis Date  . Multiple sclerosis   . Irritable bowel syndrome   . Arthritis   . Pancreatitis   . Anxiety   . Depression   . Chronic pain   . Migraine   . PTSD (post-traumatic stress disorder)   . Methadone use   . PONV (postoperative nausea and vomiting)   . 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 Grave's disease   . hypothyroidism   . Neuromuscular disorder   . Substance abuse     Medications:  Scheduled:  . heparin subcutaneous  5,000 Units Subcutaneous Q8H  . levothyroxine  75 mcg Intravenous Daily  . methadone  30 mg Oral Q8H  . nicotine  21 mg Transdermal Daily  .  pantoprazole (PROTONIX) IV  40 mg Intravenous Q24H  . potassium chloride  10 mEq Intravenous Q1 Hr x 6  . sodium chloride  3 mL Intravenous Q12H    Insulin Requirements in the past 24 hours:  none  Current Nutrition:  NPO- unable to swallow and have PO intake Full liquid diet has been ordered but do not think she will be able to eat much  Nutritional Goals:  1600-1800 kCal, 80-100 grams of protein per day per RD assessment 11/8  Assessment: 83 YOF with h/o primary distal esophagus/GE junction with met involvement who is severely malnourished and has been unable to eat for the past 2 days. She is currently scheduled for PEG placement 11/12. Through RD discussion with surgery, if able that could possibly be moved up but that is unable to be determined until Monday at the earliest.   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 No cardiovascular meds Hepatobil: albumin low at 2.3, AST and alkphos both slightly elevated, ALT and TBili WNL. No trigs or prealbumin on file. Neuro: methadone 30mg  PO q8h ID/Onc: has not yet started FOLFOX chemo treatment; WBC 5.1, afebrile. No abx. Best Practices: subq hep TPN Access: Port-A-Cath TPN day#: 0 (starting 11/8)  Plan:  -add-on phosphorous and magnesium to this morning's BMET to establish baseline and replete as needed -start Clinimix E 5/15 at 43mL/hr at 1800 since patient is at such a high risk of refeeding -start fat emulsion 20% at 60mL/hr at 1800 -goal will be Clinimix E 5/15 at rate of 59mL/hr with fat emulsion 20% at 57mL/hr which will provide a total  of 84g protein and 1776kcal daily -CBGs q4h and sensitive SSI- will follow and adjust as needed -TPN labs as ordered- will follow carefully and replete patient's electrolytes as dictated by values and clinical presentation -follow along for surgical plans, clinical progression, ability to start and tolerate tube feeds, and ability to wean TPN when appropriate  Alicia Leis D. [name], PharmD Clinical Pharmacist Pager: [phone] 09/13/2013 10:22 AM   ADDENDUM: Phos = 2.4 mag = 1.7  Plan: Replete magnesium with 1g IV x1. No need to replace phos at this point  [name] D. [name], PharmD Clinical Pharmacist Pager: [phone] 09/13/2013 12:13 PM

## 2013-09-14 LAB — DIFFERENTIAL
Basophils Absolute: 0 10*3/uL (ref 0.0–0.1)
Basophils Relative: 1 % (ref 0–1)
Eosinophils Absolute: 0.2 10*3/uL (ref 0.0–0.7)
Eosinophils Relative: 3 % (ref 0–5)
Lymphs Abs: 0.8 10*3/uL (ref 0.7–4.0)
Monocytes Relative: 8 % (ref 3–12)
Neutrophils Relative %: 71 % (ref 43–77)

## 2013-09-14 LAB — COMPREHENSIVE METABOLIC PANEL
ALT: 13 U/L (ref 0–35)
Albumin: 2.4 g/dL — ABNORMAL LOW (ref 3.5–5.2)
Alkaline Phosphatase: 123 U/L — ABNORMAL HIGH (ref 39–117)
BUN: <3 mg/dL — ABNORMAL LOW (ref 6–23)
CO2: 24 mEq/L (ref 19–32)
Chloride: 98 mEq/L (ref 96–112)
Glucose, Bld: 64 mg/dL — ABNORMAL LOW (ref 70–99)
Potassium: 3.7 mEq/L (ref 3.5–5.1)
Sodium: 136 mEq/L (ref 135–145)
Total Bilirubin: 0.3 mg/dL (ref 0.3–1.2)

## 2013-09-14 LAB — GLUCOSE, CAPILLARY
Glucose-Capillary: 102 mg/dL — ABNORMAL HIGH (ref 70–99)
Glucose-Capillary: 121 mg/dL — ABNORMAL HIGH (ref 70–99)
Glucose-Capillary: 130 mg/dL — ABNORMAL HIGH (ref 70–99)
Glucose-Capillary: 68 mg/dL — ABNORMAL LOW (ref 70–99)
Glucose-Capillary: 82 mg/dL (ref 70–99)
Glucose-Capillary: 88 mg/dL (ref 70–99)

## 2013-09-14 LAB — CBC
MCH: 31.9 pg (ref 26.0–34.0)
MCHC: 34.8 g/dL (ref 30.0–36.0)
MCV: 91.5 fL (ref 78.0–100.0)
Platelets: 349 10*3/uL (ref 150–400)
RBC: 4.11 MIL/uL (ref 3.87–5.11)

## 2013-09-14 LAB — PREALBUMIN: Prealbumin: 11.7 mg/dL — ABNORMAL LOW (ref 17.0–34.0)

## 2013-09-14 LAB — PHOSPHORUS: Phosphorus: 2.1 mg/dL — ABNORMAL LOW (ref 2.3–4.6)

## 2013-09-14 MED ORDER — GLUCOSE 40 % PO GEL
ORAL | Status: AC
  Filled 2013-09-14: qty 1

## 2013-09-14 MED ORDER — SODIUM CHLORIDE 0.9 % IV SOLN: INTRAVENOUS | Status: DC

## 2013-09-14 MED ORDER — GLUCOSE-VITAMIN C 4-6 GM-MG PO CHEW: 4.0000 | CHEWABLE_TABLET | ORAL | Status: DC | PRN

## 2013-09-14 MED ORDER — GLUCOSE 40 % PO GEL
1.0000 | ORAL | Status: DC | PRN
  Administered 2013-09-14: 37.5 g via ORAL

## 2013-09-14 MED ORDER — METHADONE HCL 10 MG PO TABS: 30.0000 mg | ORAL_TABLET | Freq: Three times a day (TID) | ORAL | Status: DC

## 2013-09-14 MED ORDER — FAT EMULSION 20 % IV EMUL
240.0000 mL | INTRAVENOUS | Status: DC
  Filled 2013-09-14: qty 250

## 2013-09-14 MED ORDER — TRACE MINERALS CR-CU-F-FE-I-MN-MO-SE-ZN IV SOLN
INTRAVENOUS | Status: AC
  Administered 2013-09-14: 18:00:00 via INTRAVENOUS
  Filled 2013-09-14: qty 1000

## 2013-09-14 MED ORDER — POTASSIUM PHOSPHATE DIBASIC 3 MMOLE/ML IV SOLN
20.0000 mmol | Freq: Once | INTRAVENOUS | Status: AC
  Administered 2013-09-14: 10:00:00 20 mmol via INTRAVENOUS
  Filled 2013-09-14: qty 6.67

## 2013-09-14 MED ORDER — FAT EMULSION 20 % IV EMUL
240.0000 mL | INTRAVENOUS | Status: AC
  Administered 2013-09-14: 240 mL via INTRAVENOUS
  Filled 2013-09-14: qty 250

## 2013-09-14 MED ORDER — METHADONE HCL 10 MG PO TABS
30.0000 mg | ORAL_TABLET | Freq: Three times a day (TID) | ORAL | Status: DC
  Administered 2013-09-14 – 2013-09-19 (×15): 30 mg via ORAL
  Filled 2013-09-14 (×15): qty 3

## 2013-09-14 MED ORDER — TRACE MINERALS CR-CU-F-FE-I-MN-MO-SE-ZN IV SOLN
INTRAVENOUS | Status: DC
  Filled 2013-09-14: qty 2000

## 2013-09-14 MED ORDER — GLUCOSE 40 % PO GEL
ORAL | Status: AC
  Administered 2013-09-14: 37.5 g via ORAL
  Filled 2013-09-14: qty 1

## 2013-09-14 MED ORDER — KCL IN DEXTROSE-NACL 20-5-0.45 MEQ/L-%-% IV SOLN
INTRAVENOUS | Status: AC
  Administered 2013-09-14: 10:00:00 via INTRAVENOUS
  Filled 2013-09-14 (×2): qty 1000

## 2013-09-14 MED ORDER — GLUCOSE 40 % PO GEL: 1.0000 | ORAL | Status: DC | PRN

## 2013-09-14 MED ORDER — TRACE MINERALS CR-CU-F-FE-I-MN-MO-SE-ZN IV SOLN
INTRAVENOUS | Status: DC
  Filled 2013-09-14: qty 1000

## 2013-09-14 NOTE — ED Provider Notes (Signed)
Medical screening examination/treatment/procedure(s) were performed by non-physician practitioner and as supervising physician I was immediately available for consultation/collaboration.  EKG Interpretation     Ventricular Rate:    PR Interval:    QRS Duration:   QT Interval:    QTC Calculation:   R Axis:     Text Interpretation:                Laray Anger, DO 09/14/13 1803

## 2013-09-14 NOTE — Progress Notes (Signed)
Peripherally Inserted Central Catheter/Midline Placement  The IV Nurse has discussed with the patient and/or persons authorized to consent for the patient, the purpose of this procedure and the potential benefits and risks involved with this procedure.  The benefits include less needle sticks, lab draws from the catheter and patient may be discharged home with the catheter.  Risks include, but not limited to, infection, bleeding, blood clot (thrombus formation), and puncture of an artery; nerve damage and irregular heat beat.  Alternatives to this procedure were also discussed.  PICC/Midline Placement Documentation   Attempt PICC line insertion x 3 unsuccessful.  After three attempts obtain veinous access but was not able the thread PICC into SVC with multiple attempts.  PICC line appears to be hitting some type of obstruction and coils back from SVC.  Recommend referral to IR for PICC placement.     [name] 09/14/2013, 12:39 PM

## 2013-09-14 NOTE — Progress Notes (Signed)
[facility]                                                                                Patient Demographics  [name], is a 47 y.o. female, DOB - [birth year], BMW:[id number]  Admit date - 09/12/2013   Admitting Physician [name], MD  Outpatient Primary MD for the patient is [name], MD  LOS - 2   Chief Complaint  Patient presents with  . Abdominal Pain  . Nausea  . 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 - Causing chronic left upper quadrant abdominal pain and dysphagia, unable to take proper diet and now has severe protein calorie malnutrition with failure to thrive. She will be started on TPN dose by pharmacy via her right-sided Port-A-Cath, general surgery is following the patient and they have scheduled her for a G-tube placement procedure on 09/17/2013. Supportive care till then. Continue chronic methadone along with short-acting narcotics for breakthrough pain, liquid diet for pleasure and comfort with aspiration precautions. She follows with Dr. Darrold Span for her oncology problems, oncology group has also been consulted.     2. 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 No acute issues. Lipase stable.      3.History of Graves' disease. Status post treatment, on thyroid replacement therapy. Will continue Synthroid at 75 mcg IV every 24 hours which is higher than her home dose since TSH is elevated and as oral intake is unreliable.   Lab Results  Component Value Date   TSH 27.010* 09/12/2013   Repeat TSH in a few days.     4. Mildly prolonged QT on EKG. No dysrhythmia on telemetry. Continue telemetry monitoring, IV magnesium given. We'll monitor electrolytes closely     5. Chronic pain syndrome. Continue home dose methadone and short-acting  narcotics for breakthrough.     6. Hypokalemia. Replaced in stable will monitor.       7. History of MS. Outpatient followup with neurologist post discharge. No acute issues.     Code Status: Full, long term prognosis is poor.  Family Communication:    Disposition Plan: Home   Procedures - PICC line, due for G-tube placement by general surgery   Consults  CCS, oncology   Medications  Scheduled Meds: . heparin subcutaneous  5,000 Units Subcutaneous Q8H  . insulin aspart  0-9 Units Subcutaneous Q4H  . levothyroxine  75 mcg Intravenous Daily  . methadone  30 mg Oral Q8H  . nicotine  21 mg Transdermal Daily  . pantoprazole (PROTONIX) IV  40 mg Intravenous Q24H  . potassium phosphate IVPB (mmol)  20 mmol Intravenous Once  . sodium chloride  3 mL Intravenous Q12H   Continuous Infusions: . sodium chloride    . dextrose 5 % and 0.45 % NaCl with KCl 20 mEq/L 75 mL/hr at 09/14/13 1011  . Marland KitchenTPN (CLINIMIX-E) Adult     And  . fat emulsion    . Marland KitchenTPN (CLINIMIX-E) Adult  And  . fat emulsion     PRN Meds:.acetaminophen, acetaminophen, dextrose, glucose-Vitamin C, HYDROmorphone (DILAUDID) injection, ondansetron (ZOFRAN) IV, ondansetron, sodium chloride  DVT Prophylaxis    Heparin   Lab Results  Component Value Date   PLT 349 09/14/2013    Antibiotics    Anti-infectives   None          Subjective:   Nihal Doan today has, No headache, No chest pain, No abdominal pain - No Nausea, No new weakness tingling or numbness, No Cough - SOB.   Objective:   Filed Vitals:   09/13/13 0512 09/13/13 1300 09/13/13 2014 09/14/13 0526  BP: 144/80 137/93 147/84 148/87  Pulse: 79 84 85 83  Temp: 98.5 F (36.9 C) 97.8 F (36.6 C) 98 F (36.7 C) 98.3 F (36.8 C)  TempSrc: Oral Oral Oral Oral  Resp: 20 18 18 17   Height:      Weight:      SpO2: 97% 100% 100% 100%    Wt Readings from Last 3 Encounters:  09/12/13 49.442 kg (109 lb)  09/09/13 49.714 kg (109 lb 9.6 oz)   09/03/13 49.351 kg (108 lb 12.8 oz)     Intake/Output Summary (Last 24 hours) at 09/14/13 1247 Last data filed at 09/14/13 4098  Gross per 24 hour  Intake   2455 ml  Output      0 ml  Net   2455 ml    Exam Awake Alert, Oriented X 3, No new F.N deficits, Normal affect [place].AT,PERRAL Supple Neck,No JVD, No cervical lymphadenopathy appriciated.  Symmetrical Chest wall movement, Good air movement bilaterally, CTAB RRR,No Gallops,Rubs or new Murmurs, No Parasternal Heave +ve B.Sounds, Abd Soft, Non tender, No organomegaly appriciated, No rebound - guarding or rigidity. No Cyanosis, Clubbing or edema, No new Rash or bruise      Data Review   Micro Results No results found for this or any previous visit (from the past 240 hour(s)).  Radiology Reports Dg Chest 2 View  09/12/2013   CLINICAL DATA:  Body pain, cough  EXAM: CHEST  2 VIEW  COMPARISON:  CT chest 07/31/2013  FINDINGS: Clear lungs. Normal heart and mediastinum. Unremarkable osseous structures. Right-sided Port-A-Cath with the tip projecting over the proximal IVC.  IMPRESSION: No active cardiopulmonary disease.   Electronically Signed   By: [name]   On: 09/12/2013 12:02         CBC  Recent Labs Lab 09/08/13 1410 09/12/13 1305 09/13/13 0400 09/14/13 0455  WBC 9.0 7.2 5.1 4.8  HGB 14.1 14.0 13.1 13.1  HCT 41.0 40.1 37.2 37.6  PLT 464* 386 355 349  MCV 90.9 90.9 90.7 91.5  MCH 31.3 31.7 32.0 31.9  MCHC 34.4 34.9 35.2 34.8  RDW 14.0 14.3 14.2 14.4  LYMPHSABS 0.9 0.7  --  0.8  MONOABS 0.6 0.4  --  0.4  EOSABS 0.1 0.2  --  0.2  BASOSABS 0.0 0.0  --  0.0    Chemistries   Recent Labs Lab 09/08/13 1412 09/12/13 1305 09/13/13 0400 09/14/13 0455  NA 137 132* 133* 136  K 3.2* 3.7 2.9* 3.7  CL  --  96 95* 98  CO2 28 24 24 24   GLUCOSE 73 69* 51* 64*  BUN 8.3 3* <3* <3*  CREATININE 0.5* 0.41* 0.40* 0.36*  CALCIUM 8.8 8.2* 7.8* 8.1*  MG  --   --  1.7 2.1  AST  --  52* 40* 32  ALT  --  21 17  13   ALKPHOS  --  133* 123* 123*  BILITOT  --  0.3 0.3 0.3   ------------------------------------------------------------------------------------------------------------------ estimated creatinine clearance is 68.5 ml/min (by C-G formula based on Cr of 0.36). ------------------------------------------------------------------------------------------------------------------ No results found for this basename: HGBA1C,  in the last 72 hours ------------------------------------------------------------------------------------------------------------------  Recent Labs  09/14/13 0455  TRIG 182*   ------------------------------------------------------------------------------------------------------------------  Recent Labs  09/12/13 1540  TSH 27.010*   ------------------------------------------------------------------------------------------------------------------ No results found for this basename: VITAMINB12, FOLATE, FERRITIN, TIBC, IRON, RETICCTPCT,  in the last 72 hours  Coagulation profile No results found for this basename: INR, PROTIME,  in the last 168 hours  No results found for this basename: DDIMER,  in the last 72 hours  Cardiac Enzymes No results found for this basename: CK, CKMB, TROPONINI, MYOGLOBIN,  in the last 168 hours ------------------------------------------------------------------------------------------------------------------ No components found with this basename: POCBNP,      Time Spent in minutes 35   Sparkle Aube K M.D on 09/14/2013 at 12:47 PM  Between 7am to 7pm - Pager - [phone]  After 7pm go to [email or web address] - password [email or web address]  And look for the night coverage person covering for me after hours  [facility] Office  [phone]

## 2013-09-14 NOTE — Progress Notes (Addendum)
PARENTERAL NUTRITION CONSULT NOTE - FOLLOW UP  Pharmacy Consult for TNA Indication: dysphagia, severe malnutrition related to esophageal cancer  Allergies  Allergen Reactions  . Erythromycin Nausea And Vomiting  . Morphine And Related Other (See Comments)    whelps on arms at iv site   Patient Measurements: Height: 5\' 3"  (160 cm) Weight: 109 lb (49.442 kg) IBW/kg (Calculated) : 52.4  Vital Signs: Temp: 98.3 F (36.8 C) (11/09 0526) Temp src: Oral (11/09 0526) BP: 148/87 mmHg (11/09 0526) Pulse Rate: 83 (11/09 0526) Intake/Output from previous day: 11/08 0701 - 11/09 0700 In: 4373.3 [I.V.:3573.3; IV Piggyback:800] Out: -  Intake/Output from this shift:    Labs:  Recent Labs  09/12/13 1305 09/13/13 0400 09/14/13 0455  WBC 7.2 5.1 4.8  HGB 14.0 13.1 13.1  HCT 40.1 37.2 37.6  PLT 386 355 349     Recent Labs  09/12/13 1305 09/13/13 0400  NA 132* 133*  K 3.7 2.9*  CL 96 95*  CO2 24 24  GLUCOSE 69* 51*  BUN 3* <3*  CREATININE 0.41* 0.40*  CALCIUM 8.2* 7.8*  MG  --  1.7  PHOS  --  2.4  PROT 6.2 5.5*  ALBUMIN 2.4* 2.3*  AST 52* 40*  ALT 21 17  ALKPHOS 133* 123*  BILITOT 0.3 0.3   Estimated Creatinine Clearance: 68.5 ml/min (by C-G formula based on Cr of 0.4).    Recent Labs  09/14/13 0045 09/14/13 0120 09/14/13 0401  GLUCAP 68* 88 82    Insulin Requirements in the past 24 hours:  None- had episodes of HYPOglycemia overnight  Current Nutrition:  Clinimix E 5/15 at 52mL/hr + fat emulsion 20% at 57mL/hr- provides 36 grams of protein and 991kcal daily Clear liquid diet has been ordered- poor appetite documented  Nutritional Goals:  1600-1800 kCal, 80-100 grams of protein per day per RD assessment 11/8  Assessment: 62 YOF with h/o primary distal esophagus/GE junction with met involvement who is severely malnourished and has been unable to eat for the past 2 days. She is currently scheduled for PEG placement 11/12. Through RD discussion with  surgery, if able that could possibly be moved up but that is unable to be determined until Monday at the earliest.   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 No cardiovascular meds Hepatobil: albumin low at 2.4, alkphos slightly elevated, AST, ALT and TBili WNL. Triglycerides 182 this morning. No prealbumin on file. Neuro: methadone 30mg  PO q8h ID/Onc: has not yet started FOLFOX chemo treatment; WBC 4.8, afebrile. No abx. Best Practices: subq hep TPN Access: Port-A-Cath TPN day#: 1 (started 11/8)  Plan:  -replace phosphorous with potassium phosphate IV x1 (also provides of potassium) -increase Clinimix E 5/15 to 56mL/hr at 1800-- slow increase since patient is at such a high risk of refeeding -start fat emulsion 20% at 23mL/hr at 1800 -goal will be Clinimix E 5/15 at rate of 20mL/hr with fat emulsion 20% at 42mL/hr which will provide a total of 84g protein and 1776kcal daily -continue CBGs q4h and sensitive SSI -TPN labs as ordered- will follow carefully and replete patient's electrolytes as dictated by values and clinical presentation- CMET, phos, mag,  trigs, and albumin all due for tomorrow morning -follow along for surgical plans, clinical progression, ability to start and tolerate tube feeds, and ability to wean TPN when appropriate  Dixie Coppa D. Jaquetta Currier, PharmD Clinical Pharmacist Pager: [phone] 09/14/2013 7:52 AM   ADDENDUM: Just spoke with RN Melina Schools who was also RN yesterday because I realized Clinimix and fat emulsion were charted yesterday as not given for "order parameters not met". Questioned why this was not hung and was told due to patient  receiving electrolyte runs, RN had been told by MD TPN was not to start until 11/9. Per the orders I received and discussions with clinicians involved in care yesterday, my understanding was TPN to start 11/8 as I had ordered. Labs I ordered this morning were acceptable with the exception of the phosphorous repletion which I addressed above. Melina Schools also told me patient was getting a PICC line.  Plan: -Since patient did NOT receive TPN yesterday, will NOT increase rate today. Therefore, start Clinimix E 5/15 at 69mL/hr with fat emulsion 20% at 13mL/hr which will provide 36 grams of protein and 991kcal daily -change MIVF to plain NS at 91mL/hr at 1800 tonight when TPN starts as dextrose and KCl from IVF and higher rate will not be needed upon initiation of Clinimix -will adjust IVF as TPN rate increases so as not to volume overload the patient -Rest of plan as above -Please contact TPN pharmacist at [phone] or main pharmacy after 1530 if there are any issues with TPN  Phala Schraeder D. Unice Vantassel, PharmD Clinical Pharmacist Pager: [phone] 09/14/2013 11:32 AM

## 2013-09-14 NOTE — Progress Notes (Signed)
Pt's CBG 62 at 0007, a can of coke given and patient encouraged to drink. Patient drank approximately 1/4 of the can recheck at 0045, CBG 68. Pt encouraged to drink more soda. At 0120 patient had drank approximately 1/2 of the can of soda and CBG recheck was 88. PRN pain and nausea medications given per MD orders. No other complaints, will continue to monitor and assist as needed. Olene Craven, RN

## 2013-09-15 ENCOUNTER — Inpatient Hospital Stay (HOSPITAL_COMMUNITY): Payer: Medicare Other

## 2013-09-15 ENCOUNTER — Ambulatory Visit: Payer: Medicare Other

## 2013-09-15 DIAGNOSIS — C159 Malignant neoplasm of esophagus, unspecified: Secondary | ICD-10-CM

## 2013-09-15 DIAGNOSIS — R109 Unspecified abdominal pain: Secondary | ICD-10-CM

## 2013-09-15 DIAGNOSIS — F17200 Nicotine dependence, unspecified, uncomplicated: Secondary | ICD-10-CM

## 2013-09-15 DIAGNOSIS — E46 Unspecified protein-calorie malnutrition: Secondary | ICD-10-CM

## 2013-09-15 DIAGNOSIS — I4581 Long QT syndrome: Secondary | ICD-10-CM

## 2013-09-15 LAB — GLUCOSE, CAPILLARY
Glucose-Capillary: 135 mg/dL — ABNORMAL HIGH (ref 70–99)
Glucose-Capillary: 118 mg/dL — ABNORMAL HIGH (ref 70–99)
Glucose-Capillary: 93 mg/dL (ref 70–99)

## 2013-09-15 LAB — DIFFERENTIAL
Basophils Absolute: 0.1 10*3/uL (ref 0.0–0.1)
Basophils Relative: 1 % (ref 0–1)
Eosinophils Absolute: 0.2 10*3/uL (ref 0.0–0.7)
Eosinophils Relative: 3 % (ref 0–5)
Monocytes Absolute: 0.5 10*3/uL (ref 0.1–1.0)
Monocytes Relative: 9 % (ref 3–12)
Neutro Abs: 4.1 10*3/uL (ref 1.7–7.7)
Neutrophils Relative %: 71 % (ref 43–77)

## 2013-09-15 LAB — COMPREHENSIVE METABOLIC PANEL
ALT: 15 U/L (ref 0–35)
AST: 26 U/L (ref 0–37)
Alkaline Phosphatase: 133 U/L — ABNORMAL HIGH (ref 39–117)
BUN: <3 mg/dL — ABNORMAL LOW (ref 6–23)
CO2: 26 mEq/L (ref 19–32)
Chloride: 97 mEq/L (ref 96–112)
Creatinine, Ser: 0.37 mg/dL — ABNORMAL LOW (ref 0.50–1.10)
GFR calc non Af Amer: >90 mL/min (ref >90)
Glucose, Bld: 122 mg/dL — ABNORMAL HIGH (ref 70–99)
Potassium: 3.2 mEq/L — ABNORMAL LOW (ref 3.5–5.1)
Sodium: 133 mEq/L — ABNORMAL LOW (ref 135–145)
Total Bilirubin: 0.2 mg/dL — ABNORMAL LOW (ref 0.3–1.2)

## 2013-09-15 LAB — CBC
HCT: 40.5 % (ref 36.0–46.0)
MCH: 32.4 pg (ref 26.0–34.0)
MCHC: 35.6 g/dL (ref 30.0–36.0)
RDW: 14.3 % (ref 11.5–15.5)

## 2013-09-15 LAB — PREALBUMIN: Prealbumin: 12.8 mg/dL — ABNORMAL LOW (ref 17.0–34.0)

## 2013-09-15 LAB — TRIGLYCERIDES: Triglycerides: 233 mg/dL — ABNORMAL HIGH (ref <150)

## 2013-09-15 LAB — PHOSPHORUS: Phosphorus: 3.2 mg/dL (ref 2.3–4.6)

## 2013-09-15 LAB — MAGNESIUM: Magnesium: 2.1 mg/dL (ref 1.5–2.5)

## 2013-09-15 MED ORDER — LORAZEPAM 1 MG PO TABS
1.0000 mg | ORAL_TABLET | Freq: Once | ORAL | Status: AC
  Administered 2013-09-15: 22:00:00 1 mg via ORAL
  Filled 2013-09-15: qty 1

## 2013-09-15 MED ORDER — TRACE MINERALS CR-CU-F-FE-I-MN-MO-SE-ZN IV SOLN
INTRAVENOUS | Status: AC
  Administered 2013-09-15: 17:00:00 via INTRAVENOUS
  Filled 2013-09-15: qty 1000

## 2013-09-15 MED ORDER — POTASSIUM CHLORIDE 10 MEQ/100ML IV SOLN
10.0000 meq | INTRAVENOUS | Status: AC
  Administered 2013-09-15 (×3): 10 meq via INTRAVENOUS
  Filled 2013-09-15 (×3): qty 100

## 2013-09-15 MED ORDER — FAT EMULSION 20 % IV EMUL
240.0000 mL | INTRAVENOUS | Status: AC
  Administered 2013-09-15: 240 mL via INTRAVENOUS
  Filled 2013-09-15: qty 250

## 2013-09-15 MED ORDER — POTASSIUM CHLORIDE 10 MEQ/100ML IV SOLN
10.0000 meq | INTRAVENOUS | Status: AC
  Administered 2013-09-15 (×2): 10 meq via INTRAVENOUS
  Filled 2013-09-15 (×5): qty 100

## 2013-09-15 MED ORDER — IOHEXOL 300 MG/ML  SOLN
50.0000 mL | Freq: Once | INTRAMUSCULAR | Status: AC | PRN
  Administered 2013-09-15: 10 mL via INTRAVENOUS

## 2013-09-15 MED ORDER — SODIUM CHLORIDE 0.9 % IJ SOLN
10.0000 mL | INTRAMUSCULAR | Status: DC | PRN
  Administered 2013-09-15 (×3): 10 mL
  Administered 2013-09-16 – 2013-09-17 (×2): 20 mL
  Administered 2013-09-17 – 2013-09-19 (×3): 10 mL
  Administered 2013-09-19: 16:00:00 30 mL
  Administered 2013-09-20: 12:00:00 20 mL
  Administered 2013-09-20 (×4): 10 mL

## 2013-09-15 NOTE — Progress Notes (Signed)
09/15/2013, 7:45 AM  Hospital day 4 Antibiotics: none Chemotherapy: none  Physicians: P.Clementon, Vermont.Basilio Cairo, E.Avbuere, B.Balan, K.Reddy, K.Hilty, Trudie Buckler (neurology, [facility])  Appreciate Dr Bertis Ruddy seeing patient after admission, as I have followed her for metastatic esophageal cancer for past ~ 4 weeks. She was found to have distal esophageal/ GE junction adenocarcinoma, T4N2 at endoscopic [place] 08-08-13 and additionally involving gastrohepatic, celiac axis and periaortic nodes and bone (T7, T11 and possibly T9 and T10) by PET on 08-19-13. She had palliative RT to distal esophagus/GE junction/T9-11 30 Gy by Dr Basilio Cairo from 10-20 to 09-05-13. Swallowing worsened thru the RT treatment, with plans in process for G tube placement as she was not candidate for PEG by GI or IR. Despite daily IVF initially at [facility] and then by [facility], she required admission for symptom management 09-12-13. TNA has been started in hospital and she is requiring IV pain medication. Surgery is aware of admission and trying to proceed with G tube as promptly as possible.  Subjective: Feeling better than on admission, including able to swallow more easily than she has in last 7-10 days. Pain is upper abdomen as it has been, at times thru to back; this is not exactly same pain as with previous pancreatitis. She is tolerating up in room and to BR. She is able to sleep 2-3 hours at a time, which is an improvement. Nausea is better. She denies SOB at rest. PAC is functioning well for TNA, PICC planned.  Objective: Vital signs in last 24 hours: Blood pressure 131/84, pulse 83, temperature 98.5 F (36.9 C), temperature source Oral, resp. rate 16, height 5\' 3"  (1.6 m), weight 109 lb (49.442 kg), last menstrual period 04/28/2011, SpO2 98.00%.   Intake/Output from previous day: 11/09 0701 - 11/10 0700 In: 479.8 [TPN:479.8] Out: -  Intake/Output this shift:    Physical exam: awake, alert,oriented and appropriate, looks chronically  ill but not as uncomfortable as at my last outpatient exam. Respirations not labored RA. PERRL, not icteric. Oral mucosa still somewhat dry, no lesions. Lungs without wheezes or rales anteriorly. Heart RRR. PAC site ok. Peripheral IV right upper arm site ok. Abdomen tender to gentle palpation epigastrium and LUQ, quiet, not distended. LE no edema, cords, tenderness. Neuro: moves all extremities equally, speech fluent and appropriate, no focal deficits. Psych as above. Skin without ecchymoses, rash, petechiae.  Lab Results:  Recent Labs  09/14/13 0455 09/15/13 0540  WBC 4.8 5.7  HGB 13.1 14.4  HCT 37.6 40.5  PLT 349 326   BMET  Recent Labs  09/14/13 0455 09/15/13 0540  NA 136 133*  K 3.7 3.2*  CL 98 97  CO2 24 26  GLUCOSE 64* 122*  BUN <3* <3*  CREATININE 0.36* 0.37*  CALCIUM 8.1* 8.7  TP 6.2, alb 2.5,AST 26, ALT 15, AP 133, Tb 0.2  Phosphorus 3.2 Magnesium 2.1 Prealbumin 12.8  Studies/Results: No results found.   Assessment/Plan: 1. 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 G tube needed for hydration, medication and nutritional support. Plan FOLFOX outpatient in palliative attempt after G tube placement if/when stable  2 chronic pancreatitis since cholecystectomy 1993, better in past 2 years post sphincterotomy and stents. Followed closely by Dr [name]. 3. On methadone maintenance for drug history. Managed by Dr Betti Cruz, who is aware of esophageal cancer.  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  12. Severe malnutrition in context of acute illness: now on short term TNA, which we had not been able to start  outpatient. [facility] dietician is already involved and will be able to assist with tube feedings after discharge.    Jarom Govan P [phone]

## 2013-09-15 NOTE — Progress Notes (Signed)
[facility]                                                                                Patient Demographics  [name], is a 47 y.o. female, DOB - 10-11-1966, HQI:[id number]  Admit date - 09/12/2013   Admitting Physician [name], MD  Outpatient Primary MD for the patient is [name], MD  LOS - 3   Chief Complaint  Patient presents with  . Abdominal Pain  . Nausea  . 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 - Causing chronic left upper quadrant abdominal pain and dysphagia, unable to take proper diet and now has severe protein calorie malnutrition with failure to thrive. She will be started on TPN dose by pharmacy via her right-sided Port-A-Cath, general surgery is following the patient and they have scheduled her for a G-tube placement procedure on 09/17/2013. Supportive care till then. Continue chronic methadone along with short-acting narcotics for breakthrough pain, liquid diet for pleasure and comfort with aspiration precautions. She follows with Dr. Darrold Span for her oncology problems, oncology group has also been consulted.     2. 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 No acute issues. Lipase stable.      3.History of Graves' disease. Status post treatment, on thyroid replacement therapy. Will continue Synthroid at 75 mcg IV every 24 hours which is higher than her home dose since TSH is elevated and as oral intake is unreliable.   Lab Results  Component Value Date   TSH 27.010* 09/12/2013   Repeat TSH in a few days.     4. Mildly prolonged QT on EKG. No dysrhythmia on telemetry. Continue telemetry monitoring, IV magnesium given. We'll monitor electrolytes closely     5. Chronic pain syndrome. Continue home dose methadone and short-acting  narcotics for breakthrough.     6. Hypokalemia. Replaced & will monitor.       7. History of MS. Outpatient followup with neurologist post discharge. No acute issues.     Code Status: Full, long term prognosis is poor.  Family Communication:    Disposition Plan: Home   Procedures -   L arm PICC line, due for G-tube placement by general surgery   Consults  CCS, oncology   Medications  Scheduled Meds: . heparin subcutaneous  5,000 Units Subcutaneous Q8H  . insulin aspart  0-9 Units Subcutaneous Q4H  . levothyroxine  75 mcg Intravenous Daily  . methadone  30 mg Oral Q8H  . nicotine  21 mg Transdermal Daily  . pantoprazole (PROTONIX) IV  40 mg Intravenous Q24H  . potassium chloride  10 mEq Intravenous Q1 Hr x 4  . sodium chloride  3 mL Intravenous Q12H   Continuous Infusions: . Marland KitchenTPN (CLINIMIX-E) Adult 30 mL/hr at 09/15/13 0553   And  . fat emulsion 240 mL (09/15/13 0553)   PRN Meds:.acetaminophen, acetaminophen, dextrose, glucose-Vitamin C, HYDROmorphone (DILAUDID) injection, ondansetron (ZOFRAN) IV, ondansetron, sodium chloride  DVT Prophylaxis  Heparin   Lab Results  Component Value Date   PLT 326 09/15/2013    Antibiotics    Anti-infectives   None          Subjective:   Jaidence Geisler today has, No headache, No chest pain, No abdominal pain - No Nausea, No new weakness tingling or numbness, No Cough - SOB.   Objective:   Filed Vitals:   09/14/13 0526 09/14/13 1432 09/14/13 2141 09/15/13 0503  BP: 148/87 143/87 122/79 131/84  Pulse: 83 83 80 83  Temp: 98.3 F (36.8 C) 98 F (36.7 C) 98 F (36.7 C) 98.5 F (36.9 C)  TempSrc: Oral Oral Oral Oral  Resp: 17 18 16 16   Height:      Weight:      SpO2: 100% 99% 100% 98%    Wt Readings from Last 3 Encounters:  09/12/13 49.442 kg (109 lb)  09/09/13 49.714 kg (109 lb 9.6 oz)  09/03/13 49.351 kg (108 lb 12.8 oz)     Intake/Output Summary (Last 24 hours) at 09/15/13 1008 Last data filed at  09/15/13 0944  Gross per 24 hour  Intake 599.83 ml  Output      0 ml  Net 599.83 ml    Exam Awake Alert, Oriented X 3, No new F.N deficits, Normal affect [place].AT,PERRAL Supple Neck,No JVD, No cervical lymphadenopathy appriciated.  Symmetrical Chest wall movement, Good air movement bilaterally, CTAB RRR,No Gallops,Rubs or new Murmurs, No Parasternal Heave +ve B.Sounds, Abd Soft, Non tender, No organomegaly appriciated, No rebound - guarding or rigidity. No Cyanosis, Clubbing or edema, No new Rash or bruise      Data Review   Micro Results No results found for this or any previous visit (from the past 240 hour(s)).  Radiology Reports Dg Chest 2 View  09/12/2013   CLINICAL DATA:  Body pain, cough  EXAM: CHEST  2 VIEW  COMPARISON:  CT chest 07/31/2013  FINDINGS: Clear lungs. Normal heart and mediastinum. Unremarkable osseous structures. Right-sided Port-A-Cath with the tip projecting over the proximal IVC.  IMPRESSION: No active cardiopulmonary disease.   Electronically Signed   By: [name]   On: 09/12/2013 12:02         CBC  Recent Labs Lab 09/08/13 1410 09/12/13 1305 09/13/13 0400 09/14/13 0455 09/15/13 0540  WBC 9.0 7.2 5.1 4.8 5.7  HGB 14.1 14.0 13.1 13.1 14.4  HCT 41.0 40.1 37.2 37.6 40.5  PLT 464* 386 355 349 326  MCV 90.9 90.9 90.7 91.5 91.2  MCH 31.3 31.7 32.0 31.9 32.4  MCHC 34.4 34.9 35.2 34.8 35.6  RDW 14.0 14.3 14.2 14.4 14.3  LYMPHSABS 0.9 0.7  --  0.8 0.9  MONOABS 0.6 0.4  --  0.4 0.5  EOSABS 0.1 0.2  --  0.2 0.2  BASOSABS 0.0 0.0  --  0.0 0.1    Chemistries   Recent Labs Lab 09/08/13 1412 09/12/13 1305 09/13/13 0400 09/14/13 0455 09/15/13 0540  NA 137 132* 133* 136 133*  K 3.2* 3.7 2.9* 3.7 3.2*  CL  --  96 95* 98 97  CO2 28 24 24 24 26   GLUCOSE 73 69* 51* 64* 122*  BUN 8.3 3* <3* <3* <3*  CREATININE 0.5* 0.41* 0.40* 0.36* 0.37*  CALCIUM 8.8 8.2* 7.8* 8.1* 8.7  MG  --   --  1.7 2.1 2.1  AST  --  52* 40* 32 26  ALT  --  21 17 13 15    ALKPHOS  --  133* 123* 123* 133*  BILITOT  --  0.3 0.3 0.3 0.2*   ------------------------------------------------------------------------------------------------------------------ estimated creatinine clearance is 68.5 ml/min (by C-G formula based on Cr of 0.37). ------------------------------------------------------------------------------------------------------------------ No results found for this basename: HGBA1C,  in the last 72 hours ------------------------------------------------------------------------------------------------------------------  Recent Labs  09/14/13 0455 09/15/13 0540  TRIG 182* 233*   ------------------------------------------------------------------------------------------------------------------  Recent Labs  09/12/13 1540  TSH 27.010*   ------------------------------------------------------------------------------------------------------------------ No results found for this basename: VITAMINB12, FOLATE, FERRITIN, TIBC, IRON, RETICCTPCT,  in the last 72 hours  Coagulation profile No results found for this basename: INR, PROTIME,  in the last 168 hours  No results found for this basename: DDIMER,  in the last 72 hours  Cardiac Enzymes No results found for this basename: CK, CKMB, TROPONINI, MYOGLOBIN,  in the last 168 hours ------------------------------------------------------------------------------------------------------------------ No components found with this basename: POCBNP,      Time Spent in minutes 35   Brahm Barbeau K M.D on 09/15/2013 at 10:08 AM  Between 7am to 7pm - Pager - [phone]  After 7pm go to [email or web address] - password [email or web address]  And look for the night coverage person covering for me after hours  [facility] Office  [phone]

## 2013-09-15 NOTE — Progress Notes (Signed)
Pt would like something for sleep. Contacting night coverage in amion.

## 2013-09-15 NOTE — Procedures (Signed)
L arm PowerPICC placed under [place] and fluoroscopy as MIDLINE catheter No ptx on spot chest radiograph. No complication No blood loss. See complete dictation in [facility].

## 2013-09-15 NOTE — Progress Notes (Deleted)
LOS: 3 days   Subjective: Continuing to complain of abdominal pain and some nausea. Patient was unaware that her pain meds were scheduled for q 3 hours and not q 4 hours. Says TPN makes her feel better. No BM, +flatus. Denies fever, chills.   Objective: Vital signs in last 24 hours: Temp:  [98 F (36.7 C)-98.5 F (36.9 C)] 98.5 F (36.9 C) (11/10 0503) Pulse Rate:  [80-83] 83 (11/10 0503) Resp:  [16-18] 16 (11/10 0503) BP: (122-143)/(79-87) 131/84 mmHg (11/10 0503) SpO2:  [98 %-100 %] 98 % (11/10 0503) Last BM Date: 09/12/13   Laboratory  CBC  Recent Labs  09/14/13 0455 09/15/13 0540  WBC 4.8 5.7  HGB 13.1 14.4  HCT 37.6 40.5  PLT 349 326   BMET  Recent Labs  09/14/13 0455 09/15/13 0540  NA 136 133*  K 3.7 3.2*  CL 98 97  CO2 24 26  GLUCOSE 64* 122*  BUN <3* <3*  CREATININE 0.36* 0.37*  CALCIUM 8.1* 8.7     Physical Exam General appearance: alert and oriented x 3 and no distress Resp: clear to auscultation bilaterally Cardio: regular rate and rhythm GI: +BS, soft, diffusely tender, no guarding, no distension Extremities: extremities normal, atraumatic, no cyanosis or edema Neurologic: Grossly normal   Assessment/Plan: Metastatic esophageal cancer:  Gastrostomy tube insertion scheduled for  09/17/2013 with Dr. Derrell Lolling.  Severe protein calorie malnutrition: Continue rehydration and TPN.  Chronic pancreatitis: Continue pain management.   Graves' disease History of MS Mildly prolonged QT Chronic Pain syndrome  Maris Berger, PA-S  09/15/2013

## 2013-09-15 NOTE — Progress Notes (Signed)
  Subjective: The patient is known to me. Please see my detailed office consultation dated 09/09/2013. Admitted [facility] 09/12/2013 for failure to thrive, pain control, dehydration secondary to her metastatic esophageal cancer. Some of her pain may be due to chronic pancreatitis, but this is hard to sort out with her distal esophageal cancer and celiac metastasis and bony metastasis to the spine. She has been rehydrated and is now on TPN and she does feel better. She still has pain but it is better controlled. She has severe protein calorie malnutrition manifest by 30-40 pound weight loss and admission albumin 2.4.  Objective: Vital signs in last 24 hours: Temp:  [98 F (36.7 C)-98.5 F (36.9 C)] 98.5 F (36.9 C) (11/10 0503) Pulse Rate:  [80-83] 83 (11/10 0503) Resp:  [16-18] 16 (11/10 0503) BP: (122-143)/(79-87) 131/84 mmHg (11/10 0503) SpO2:  [98 %-100 %] 98 % (11/10 0503) Last BM Date: 09/12/13  Intake/Output from previous day: 11/09 0701 - 11/10 0700 In: 479.8 [TPN:479.8] Out: -  Intake/Output this shift: Total I/O In: 479.8 [TPN:479.8] Out: -   General appearance: alert. Cooperative. The condition but in no acute distress. GI: abdomen is soft. Nondistended. Some tenderness across the entire upper abdomen. Does not appear different than exam in my office last week. No palpable mass. Old scars from laparoscopic cholecystectomy and upper abdominal hernia repair.  Lab Results:   Recent Labs  09/14/13 0455 09/15/13 0540  WBC 4.8 5.7  HGB 13.1 14.4  HCT 37.6 40.5  PLT 349 326   BMET  Recent Labs  09/13/13 0400 09/14/13 0455  NA 133* 136  K 2.9* 3.7  CL 95* 98  CO2 24 24  GLUCOSE 51* 64*  BUN <3* <3*  CREATININE 0.40* 0.36*  CALCIUM 7.8* 8.1*   PT/INR No results found for this basename: LABPROT, INR,  in the last 72 hours ABG No results found for this basename: PHART, PCO2, PO2, HCO3,  in the last 72 hours  Studies/Results: No results  found.  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 Plan feeding gastrostomy tube insertion on Wednesday, November 12 as scheduled.  Severe protein calorie malnutrition. TPN is appropriate preop. Hopefully we can rapidly convert to enteral feedings postop to proceed with outpatient management.  Because of her metastatic disease to bone and distant lymph node involvement, she is apparently not a candidate for esophagectomy at any point in the future, according to Dr. Darrold Span.  Chronic pancreatitis   on methadone maintenance for drug history, Dr. Betti Cruz.  History laparoscopic cholecystectomy  History epigastric hernia repair  Recent Port-A-Cath placement  Status post treatment of Graves' disease, on replacement therapy  Multiple sclerosis, minimally symptomatic  Long QT syndrome   Will follow.    LOS: 3 days    [place] President M. Derrell Lolling, M.D., [facility] General and Minimally invasive Surgery Breast and Colorectal Surgery Office:   [phone] Pager:   [phone]  09/15/2013

## 2013-09-15 NOTE — Progress Notes (Signed)
PARENTERAL NUTRITION CONSULT NOTE - FOLLOW UP  Pharmacy Consult for TNA Indication: dysphagia, severe malnutrition related to esophageal cancer  Allergies  Allergen Reactions  . Erythromycin Nausea And Vomiting  . Morphine And Related Other (See Comments)    whelps on arms at iv site   Patient Measurements: Height: 5\' 3"  (160 cm) Weight: 109 lb (49.442 kg) IBW/kg (Calculated) : 52.4  Vital Signs: Temp: 98.5 F (36.9 C) (11/10 0503) Temp src: Oral (11/10 0503) BP: 131/84 mmHg (11/10 0503) Pulse Rate: 83 (11/10 0503) Intake/Output from previous day: 11/09 0701 - 11/10 0700 In: 479.8 [TPN:479.8] Out: -  Intake/Output from this shift: Total I/O In: 120 [P.O.:120] Out: -   Labs:  Recent Labs  09/13/13 0400 09/14/13 0455 09/15/13 0540  WBC 5.1 4.8 5.7  HGB 13.1 13.1 14.4  HCT 37.2 37.6 40.5  PLT 355 349 326     Recent Labs  09/13/13 0400 09/14/13 0455 09/15/13 0540  NA 133* 136 133*  K 2.9* 3.7 3.2*  CL 95* 98 97  CO2 24 24 26   GLUCOSE 51* 64* 122*  BUN <3* <3* <3*  CREATININE 0.40* 0.36* 0.37*  CALCIUM 7.8* 8.1* 8.7  MG 1.7 2.1 2.1  PHOS 2.4 2.1* 3.2  PROT 5.5* 5.6* 6.2  ALBUMIN 2.3* 2.4* 2.5*  AST 40* 32 26  ALT 17 13 15   ALKPHOS 123* 123* 133*  BILITOT 0.3 0.3 0.2*  PREALBUMIN  --  11.7*  --   TRIG  --  182* 233*   Estimated Creatinine Clearance: 68.5 ml/min (by C-G formula based on Cr of 0.37).    Recent Labs  09/14/13 2355 09/15/13 0413 09/15/13 0753  GLUCAP 106* 135* 118*    Insulin Requirements in the past 24 hours:  3 units of SSI  Current Nutrition:  Clinimix E 5/15 at 38mL/hr + fat emulsion 20% at 24mL/hr- provides 36 grams of protein and 991kcal daily Clear liquid diet for enjoyment/comfort- poor appetite documented- intake of about 10% of breakfast this morning  Nutritional Goals:  1600-1800 kCal, 80-100 grams of protein per day per RD assessment 11/8  Assessment: 85 YOF with h/o primary distal esophagus/GE junction with  met involvement who is severely malnourished and has been unable to eat for the past 2 days. She is scheduled for PEG placement 11/12.    GI: hx recurrent pancreatitis and sphincter of Oddi dysfunction, lipase stable currently; IV PPI Endo: hx Graves' disease- home dose Synthroid PO- here is being started on Synthroid IV d/t elevated TSH Lytes: Na 133, K 3.2- currently getting KCl runs x4, Cl 97, CorCa 9.9; Mag 2.1, Phos 3.2 Renal: SCr 0.36, CrCl 65-70mL/min Pulm: 100/RA, no meds Cards: BP nml-slightly elevated, HR 80s. No cardiovascular meds Hepatobil: albumin low at 2.5, alkphos slightly elevated at 133, AST, ALT WNL, TBili slightly low at 0.2. Triglycerides 233 this morning- increase from 182 yesterday. Prealbumin 11.7 which indicated malnutrition Neuro: methadone 30mg  PO q8h ID/Onc: has not yet started FOLFOX chemo treatment; WBC 5.7, afebrile. No abx. Best Practices: subq hep TPN Access: Port-A-Cath TPN day#: 1 (ordered 11/8 but did not start until 11/9 due to miscommunication between RN, MD, and RPh)  Plan:  -will order KCl runs x3 more after current orders are complete- will start my order at 1400 -increase Clinimix E 5/15 to 32mL/hr at 1800-- slow increase since patient is at such a high risk of refeeding -continue fat emulsion 20% at 92mL/hr -goal will be Clinimix E 5/15 at  rate of 43mL/hr with fat emulsion 20% at 21mL/hr which will provide a total of 84g protein and 1776kcal daily -continue CBGs q4h and sensitive SSI -TPN labs as ordered- BMET for the morning; watch triglycerides closely -follow along for surgical plans, clinical progression, ability to start and tolerate tube feeds, and ability to wean TPN when appropriate  Jowana Thumma D. Rayven Rettig, PharmD Clinical Pharmacist Pager: [phone] 09/15/2013 10:14 AM

## 2013-09-16 ENCOUNTER — Inpatient Hospital Stay (HOSPITAL_COMMUNITY): Payer: Medicare Other

## 2013-09-16 ENCOUNTER — Telehealth: Payer: Self-pay

## 2013-09-16 ENCOUNTER — Ambulatory Visit: Payer: Medicare Other

## 2013-09-16 DIAGNOSIS — E278 Other specified disorders of adrenal gland: Secondary | ICD-10-CM

## 2013-09-16 LAB — URINALYSIS, ROUTINE W REFLEX MICROSCOPIC
Bilirubin Urine: NEGATIVE
Glucose, UA: NEGATIVE mg/dL
Hgb urine dipstick: NEGATIVE
Ketones, ur: NEGATIVE mg/dL
Protein, ur: NEGATIVE mg/dL
Urobilinogen, UA: 1 mg/dL (ref 0.0–1.0)

## 2013-09-16 LAB — BASIC METABOLIC PANEL
BUN: 5 mg/dL — ABNORMAL LOW (ref 6–23)
Calcium: 8.8 mg/dL (ref 8.4–10.5)
Chloride: 97 mEq/L (ref 96–112)
GFR calc non Af Amer: >90 mL/min (ref >90)
Glucose, Bld: 87 mg/dL (ref 70–99)
Potassium: 3.1 mEq/L — ABNORMAL LOW (ref 3.5–5.1)

## 2013-09-16 LAB — GLUCOSE, CAPILLARY
Glucose-Capillary: 101 mg/dL — ABNORMAL HIGH (ref 70–99)
Glucose-Capillary: 96 mg/dL (ref 70–99)

## 2013-09-16 MED ORDER — CEFAZOLIN SODIUM-DEXTROSE 2-3 GM-% IV SOLR: 2.0000 g | INTRAVENOUS | Status: DC

## 2013-09-16 MED ORDER — FAT EMULSION 20 % IV EMUL
240.0000 mL | INTRAVENOUS | Status: AC
  Administered 2013-09-16: 18:00:00 240 mL via INTRAVENOUS
  Filled 2013-09-16: qty 250

## 2013-09-16 MED ORDER — ONDANSETRON 8 MG/NS 50 ML IVPB
8.0000 mg | Freq: Three times a day (TID) | INTRAVENOUS | Status: DC | PRN
  Administered 2013-09-16 – 2013-09-17 (×2): 8 mg via INTRAVENOUS
  Filled 2013-09-16 (×4): qty 8

## 2013-09-16 MED ORDER — HEPARIN SODIUM (PORCINE) 5000 UNIT/ML IJ SOLN
5000.0000 [IU] | Freq: Once | INTRAMUSCULAR | Status: DC
  Filled 2013-09-16: qty 1

## 2013-09-16 MED ORDER — ONDANSETRON HCL 4 MG/2ML IJ SOLN
INTRAMUSCULAR | Status: AC
  Filled 2013-09-16: qty 2

## 2013-09-16 MED ORDER — CEFAZOLIN SODIUM-DEXTROSE 2-3 GM-% IV SOLR
2.0000 g | INTRAVENOUS | Status: DC
  Filled 2013-09-16: qty 50

## 2013-09-16 MED ORDER — WHITE PETROLATUM GEL: Status: DC | PRN

## 2013-09-16 MED ORDER — ONDANSETRON HCL 4 MG/2ML IJ SOLN
4.0000 mg | Freq: Once | INTRAMUSCULAR | Status: AC
  Administered 2013-09-16: 4 mg via INTRAVENOUS
  Filled 2013-09-16: qty 2

## 2013-09-16 MED ORDER — LORAZEPAM 2 MG/ML IJ SOLN
0.5000 mg | Freq: Four times a day (QID) | INTRAMUSCULAR | Status: DC | PRN
  Administered 2013-09-17 – 2013-09-19 (×3): 0.5 mg via INTRAVENOUS
  Filled 2013-09-16 (×3): qty 1

## 2013-09-16 MED ORDER — POTASSIUM CHLORIDE 10 MEQ/50ML IV SOLN
10.0000 meq | INTRAVENOUS | Status: AC
  Administered 2013-09-16 (×6): 10 meq via INTRAVENOUS
  Filled 2013-09-16 (×6): qty 50

## 2013-09-16 MED ORDER — TRACE MINERALS CR-CU-F-FE-I-MN-MO-SE-ZN IV SOLN
INTRAVENOUS | Status: AC
  Administered 2013-09-16: 18:00:00 via INTRAVENOUS
  Filled 2013-09-16: qty 1000

## 2013-09-16 NOTE — Progress Notes (Signed)
[facility]                                                                                Patient Demographics  [name], is a 47 y.o. female, DOB - 01-02-1966, ZOX:[id number]  Admit date - 09/12/2013   Admitting Physician [name], MD  Outpatient Primary MD for the patient is [name], MD  LOS - 4   Chief Complaint  Patient presents with  . Abdominal Pain  . Nausea  . 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 She was due for a feeding tube placement by general surgery in the outpatient setting on 09/17/2013, however she was unable to keep any food down for the last week and came to the hospital much earlier, while in the hospital she was seen by general surgery who are going to do the feeding tube placement on 09/17/2013 as per previous schedule, in the meantime patient has been started on TNA via PICC line. She is relatively symptom-free except for chronic left upper quadrant pain and back pain due to her metastatic disease.     Assessment & Plan    1.H/O Primary distal esophagus/ GE junction with involved nodes at gastrohepatic ligament and celiac axis as well as left periaortics, and bony mets T7, T11 and possibly T9 and T10. - Causing chronic left upper quadrant abdominal pain and dysphagia, unable to take proper diet and now has severe protein calorie malnutrition with failure to thrive. She will be started on TPN dose by pharmacy via her right-sided Port-A-Cath, general surgery is following the patient and they have scheduled her for a G-tube placement procedure on 09/17/2013. Supportive care till then. Continue chronic methadone along with short-acting narcotics for breakthrough pain, liquid diet for pleasure and comfort with  aspiration precautions. She follows with Dr. Darrold Span for her oncology problems.     2. 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 No acute issues. Lipase stable.      3.History of Graves' disease. Status post treatment, on thyroid replacement therapy. Will continue Synthroid at 75 mcg IV every 24 hours which is higher than her home dose since TSH is elevated and as oral intake is unreliable.   Lab Results  Component Value Date   TSH 27.010* 09/12/2013     Repeat TSH has been ordered     4. Mildly prolonged QT on EKG. No dysrhythmia on telemetry. Continue telemetry monitoring, IV magnesium given. We'll monitor electrolytes closely     5. Chronic pain syndrome. Continue home dose methadone and short-acting narcotics for breakthrough.     6. Hypokalemia. Replaced IV repeat in the morning.      7. History of MS. Outpatient followup with neurologist post discharge. No acute issues.     Code Status: Full, long term prognosis is poor.  Family Communication:    Disposition Plan: Home   Procedures -   L arm PICC line, due for G-tube placement by general surgery on  09/17/2013   Consults  CCS    Medications  Scheduled Meds: .  ceFAZolin (ANCEF) IV  2 g Intravenous On Call to OR  . heparin  5,000 Units Subcutaneous Once  . levothyroxine  75 mcg Intravenous Daily  . methadone  30 mg Oral Q8H  . nicotine  21 mg Transdermal Daily  . pantoprazole (PROTONIX) IV  40 mg Intravenous Q24H  . potassium chloride  10 mEq Intravenous Q1 Hr x 6  . sodium chloride  3 mL Intravenous Q12H   Continuous Infusions: . Marland KitchenTPN (CLINIMIX-E) Adult 40 mL/hr at 09/15/13 1729   And  . fat emulsion 240 mL (09/15/13 1730)  . Marland KitchenTPN (CLINIMIX-E) Adult     And  . fat emulsion     PRN Meds:.acetaminophen, acetaminophen, dextrose, glucose-Vitamin C, HYDROmorphone (DILAUDID) injection, ondansetron (ZOFRAN) IV, ondansetron, sodium chloride, sodium chloride  DVT  Prophylaxis    Heparin   Lab Results  Component Value Date   PLT 326 09/15/2013    Antibiotics    Anti-infectives   Start     Dose/Rate Route Frequency Ordered Stop   09/16/13 0645  ceFAZolin (ANCEF) IVPB 2 g/50 mL premix  Status:  Discontinued     2 g 100 mL/hr over 30 Minutes Intravenous On call to O.R. 09/16/13 1610 09/16/13 0654   09/16/13 0645  ceFAZolin (ANCEF) IVPB 2 g/50 mL premix     2 g 100 mL/hr over 30 Minutes Intravenous On call to O.R. 09/16/13 0641 09/17/13 0559          Subjective:   Fuller Plan today has, No headache, No chest pain, No abdominal pain - No Nausea, No new weakness tingling or numbness, No Cough - SOB.   Objective:   Filed Vitals:   09/15/13 1429 09/15/13 2053 09/16/13 0431 09/16/13 0720  BP: 108/56 114/79 114/77 119/73  Pulse: 84 89 93 88  Temp:  97.6 F (36.4 C) 98 F (36.7 C) 97.9 F (36.6 C)  TempSrc:  Oral Oral Oral  Resp: 18 18 18 16   Height:      Weight:      SpO2: 100% 100% 98% 100%    Wt Readings from Last 3 Encounters:  09/12/13 49.442 kg (109 lb)  09/09/13 49.714 kg (109 lb 9.6 oz)  09/03/13 49.351 kg (108 lb 12.8 oz)     Intake/Output Summary (Last 24 hours) at 09/16/13 0911 Last data filed at 09/16/13 0856  Gross per 24 hour  Intake    460 ml  Output      0 ml  Net    460 ml    Exam Awake Alert, Oriented X 3, No new F.N deficits, Normal affect Welcome.AT,PERRAL Supple Neck,No JVD, No cervical lymphadenopathy appriciated.  Symmetrical Chest wall movement, Good air movement bilaterally, CTAB RRR,No Gallops,Rubs or new Murmurs, No Parasternal Heave +ve B.Sounds, Abd Soft, Non tender, No organomegaly appriciated, No rebound - guarding or rigidity. No Cyanosis, Clubbing or edema, No new Rash or bruise      Data Review   Micro Results No results found for this or any previous visit (from the past 240 hour(s)).  Radiology Reports Dg Chest 2 View  09/12/2013   CLINICAL DATA:  Body pain, cough  EXAM: CHEST  2  VIEW  COMPARISON:  CT chest 07/31/2013  FINDINGS: Clear lungs. Normal heart and mediastinum. Unremarkable osseous structures. Right-sided Port-A-Cath with the tip projecting over the proximal IVC.  IMPRESSION: No active cardiopulmonary disease.   Electronically Signed   By:  Hetal  Patel   On: 09/12/2013 12:02         CBC  Recent Labs Lab 09/12/13 1305 09/13/13 0400 09/14/13 0455 09/15/13 0540  WBC 7.2 5.1 4.8 5.7  HGB 14.0 13.1 13.1 14.4  HCT 40.1 37.2 37.6 40.5  PLT 386 355 349 326  MCV 90.9 90.7 91.5 91.2  MCH 31.7 32.0 31.9 32.4  MCHC 34.9 35.2 34.8 35.6  RDW 14.3 14.2 14.4 14.3  LYMPHSABS 0.7  --  0.8 0.9  MONOABS 0.4  --  0.4 0.5  EOSABS 0.2  --  0.2 0.2  BASOSABS 0.0  --  0.0 0.1    Chemistries   Recent Labs Lab 09/12/13 1305 09/13/13 0400 09/14/13 0455 09/15/13 0540 09/16/13 0535  NA 132* 133* 136 133* 136  K 3.7 2.9* 3.7 3.2* 3.1*  CL 96 95* 98 97 97  CO2 24 24 24 26 27   GLUCOSE 69* 51* 64* 122* 87  BUN 3* <3* <3* <3* 5*  CREATININE 0.41* 0.40* 0.36* 0.37* 0.45*  CALCIUM 8.2* 7.8* 8.1* 8.7 8.8  MG  --  1.7 2.1 2.1  --   AST 52* 40* 32 26  --   ALT 21 17 13 15   --   ALKPHOS 133* 123* 123* 133*  --   BILITOT 0.3 0.3 0.3 0.2*  --    ------------------------------------------------------------------------------------------------------------------ estimated creatinine clearance is 68.5 ml/min (by C-G formula based on Cr of 0.45). ------------------------------------------------------------------------------------------------------------------ No results found for this basename: HGBA1C,  in the last 72 hours ------------------------------------------------------------------------------------------------------------------  Recent Labs  09/14/13 0455 09/15/13 0540  TRIG 182* 233*   ------------------------------------------------------------------------------------------------------------------ No results found for this basename: TSH, T4TOTAL, FREET3,  T3FREE, THYROIDAB,  in the last 72 hours ------------------------------------------------------------------------------------------------------------------ No results found for this basename: VITAMINB12, FOLATE, FERRITIN, TIBC, IRON, RETICCTPCT,  in the last 72 hours  Coagulation profile No results found for this basename: INR, PROTIME,  in the last 168 hours  No results found for this basename: DDIMER,  in the last 72 hours  Cardiac Enzymes No results found for this basename: CK, CKMB, TROPONINI, MYOGLOBIN,  in the last 168 hours ------------------------------------------------------------------------------------------------------------------ No components found with this basename: POCBNP,      Time Spent in minutes 35   SINGH,PRASHANT K M.D on 09/16/2013 at 9:11 AM  Between 7am to 7pm - Pager - [phone]  After 7pm go to [email or web address] - password [email or web address]  And look for the night coverage person covering for me after hours  [facility] Office  [phone]

## 2013-09-16 NOTE — Progress Notes (Signed)
09/16/2013, 10:35 AM  Hospital day 5 Antibiotics: none  (ancef on call to OR for procedure 09-17-13) Chemotherapy: none to date TNA begun 09-14-13  Patient seen with unit RN present, no family here now. Plan noted for G tube placement 09-17-13.  Subjective: More pain left flank, using dilaudid IV every 3-4 hours; also on maintenance methadone. Able to swallow some pills, but did vomit after liquids this AM and is nauseated again now despite zofran 4 mg @ 0740. No increased SOB. PICC LUE and PAC functioning well.  Objective: Vital signs in last 24 hours: Blood pressure 119/73, pulse 88, temperature 97.9 F (36.6 C), temperature source Oral, resp. rate 16, height 5\' 3"  (1.6 m), weight 109 lb (49.442 kg), last menstrual period 04/28/2011, SpO2 100.00%.  Total IVs (TNA + fluids) by rates  in process now 70 cc/hr. Intake/Output from previous day: 11/10 0701 - 11/11 0700 In: 120 [P.O.:120] Out: -  Intake/Output this shift: Total I/O In: 340 [P.O.:340] Out: -   Physical exam: looks moderately uncomfortable seated on side of bed, rubbing left flank. Awake and alert, appropriate responses, cooperative. Mouth still dry, without lesions. No JVD. Not icteric.Lungs with diminished BS bilaterally, otherwise clear to auscultation and percussion to bases. Left flank without ecchymosis, rash. PICC left upper arm. PAC site ok. Heart RRR, clear heart sounds. Abdomen quiet, not distended. LE no edema, cords, tenderness.  Lab Results:  Recent Labs  09/14/13 0455 09/15/13 0540  WBC 4.8 5.7  HGB 13.1 14.4  HCT 37.6 40.5  PLT 349 326   BMET  Recent Labs  09/15/13 0540 09/16/13 0535  NA 133* 136  K 3.2* 3.1*  CL 97 97  CO2 26 27  GLUCOSE 122* 87  BUN <3* 5*  CREATININE 0.37* 0.45*  CALCIUM 8.7 8.8    Studies/Results: Ir Fluoro Guide Cv Line Left  09/15/2013   CLINICAL DATA:  Multiple sclerosis, irritable bowel syndrome, poor venous access.  EXAM: MIDLINE CATHETER PLACEMENT WITH  ULTRASOUND AND FLUOROSCOPY  TECHNIQUE: After written informed consent was obtained, patient was placed in the supine position on angiographic table. Left side approach was selected because of an indwelling right port catheter. Patency of the left basilic vein was confirmed with ultrasound with image documentation. An appropriate skin site was determined. Skin site was marked. Region was prepped using maximum barrier technique including cap and mask, sterile gown, sterile gloves, large sterile sheet, and Chlorhexidine as cutaneous antisepsis. The region was infiltrated locally with 1% lidocaine. Under real-time ultrasound guidance, the left basilic vein was accessed with a 21 gauge micropuncture needle; the needle tip within the vein was confirmed with ultrasound image documentation. Needle exchanged over a 018 guidewire for a peel-away sheath. The guidewire would not advance all way to the SVC, consistently diverted at the level of the left IJ vein. 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 A midline catheter placement was therefore performed. Through the peel-away sheath a 5-French double-lumen power injectable PICC trimmed to 27cm was advanced, positioned with its tip in the central aspect of the left subclavian vein. Spot chest radiograph confirms appropriate catheter position. Catheter was flushed per protocol and secured externally with 0-Prolene sutures. The patient tolerated procedure well, with no immediate complication.  FLUOROSCOPY TIME:  30 seconds  IMPRESSION: 1. Long segment left innominate vein occlusion. 2. Technically successful five [place] double lumen power injectable left arm midline catheter placement   Electronically Signed   By:  Oley Balm M.D.   On: 09/15/2013 10:39   Ir US Guide Vasc Access Left  09/15/2013   CLINICAL DATA:  Multiple sclerosis, irritable bowel syndrome, poor venous  access.  EXAM: MIDLINE CATHETER PLACEMENT WITH ULTRASOUND AND FLUOROSCOPY  TECHNIQUE: After written informed consent was obtained, patient was placed in the supine position on angiographic table. Left side approach was selected because of an indwelling right port catheter. Patency of the left basilic vein was confirmed with ultrasound with image documentation. An appropriate skin site was determined. Skin site was marked. Region was prepped using maximum barrier technique including cap and mask, sterile gown, sterile gloves, large sterile sheet, and Chlorhexidine as cutaneous antisepsis. The region was infiltrated locally with 1% lidocaine. Under real-time ultrasound guidance, the left basilic vein was accessed with a 21 gauge micropuncture needle; the needle tip within the vein was confirmed with ultrasound image documentation. Needle exchanged over a 018 guidewire for a peel-away sheath. The guidewire would not advance all way to the SVC, consistently diverted at the level of the left IJ vein. 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 A midline catheter placement was therefore performed. Through the peel-away sheath a 5-French double-lumen power injectable PICC trimmed to 27cm was advanced, positioned with its tip in the central aspect of the left subclavian vein. Spot chest radiograph confirms appropriate catheter position. Catheter was flushed per protocol and secured externally with 0-Prolene sutures. The patient tolerated procedure well, with no immediate complication.  FLUOROSCOPY TIME:  30 seconds  IMPRESSION: 1. Long segment left innominate vein occlusion. 2. Technically successful five [place] double lumen power injectable left arm midline catheter placement   Electronically Signed   By: Oley Balm M.D.   On: 09/15/2013 10:39     Assessment/Plan: 1. 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 G tube needed for hydration, medication and nutritional support. Plan FOLFOX outpatient in palliative attempt after G tube placement if/when stable. Flank pain may be from periaortic nodes or bone, but has hx UTIs and may be at risk for ureteral obstruction from the adenopathy. Have ordered UA with reflex and renal ultrasound. 2 chronic pancreatitis since cholecystectomy 1993, better in past 2 years post sphincterotomy and stents. Followed  by Dr [name].  3. On methadone maintenance for drug history. Managed by Dr Betti Cruz, who is aware of esophageal cancer.  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  12. Severe malnutrition in context of acute illness: now on short term TNA, with plan for tube feeding via G tube. Pharmacy following closely due to high risk refeeding syndrome. [facility] dietician available to assist with tube feeds after DC. 13. Bilateral adrenal hyperplasia by last scans: AM cortisol ordered for 09-17-13.   Does she need increase in IVF rate for now?  LIVESAY,LENNIS P

## 2013-09-16 NOTE — Telephone Encounter (Signed)
Faxed signed order dated 09-15-13 for IV fluids  To [facility]. Sent a copy to HIM to be scanned into patient's EMR.

## 2013-09-16 NOTE — Progress Notes (Signed)
NUTRITION FOLLOW-UP  DOCUMENTATION CODES Per approved criteria  -Severe malnutrition in the context of chronic illness   INTERVENTION: TPN per PharmD. Slow advancement 2/2 high risk for refeeding syndrome. Once PEG is ready for use recommend initiation of 1/2 can of Osmolite 1.2 QID. Increase by 1/2 can at each feeding daily until pt reaches goal of 1.5 cans QID. Pt will require slow advancement if still at refeeding syndrome risk and lytes not WNL. Goal TF regimen will provide: 1710 kcal, 80 grams protein, and 1170 ml H2O.  Once no longer on IVF, recommend 60 ml H2O flush before and after each feeding, additional 150 ml H2O flush daily. Total free water from TF and free water flushes will provide 1800 ml daily. RD to continue to follow nutrition care plan.  NUTRITION DIAGNOSIS: Malnutrition related to cancer as evidenced by 16% weight loss x 1 month, intake of </= 75% of needs for >/= 1 month, and severe fat and muscle wasting. Ongoing.  Goal: Pt to meet >/= 90% of their estimated nutrition needs - unmet.  Monitor:  TF initiation and tolerance, weight trend, labs, transition off TPN  ASSESSMENT: PMHx of metastatic adenocarcinoma of distal esophagus. Pt was planned to have PEG placed on 11/12 however pt was admitted to hospital 2/2 inability to swallow given her esophageal obstruction.  Patient started on TPN 11/8 as she is unable to undergo PEG placement until 11/12. Patient is currently receiving TPN with Clinimix E 5/15 @ 40 ml/hr and lipids @ 10 ml/hr. Provides 1162 kcal and 48 grams protein per day. Meets 73% minimum estimated energy needs and 60% minimum estimated protein needs. Note that patient's goal is Clinimix E 5/15 @ 70 ml/hr with Lipids @ 10 ml/hr however pt is requiring very slow advancement of TPN 2/2 refeeding syndrome and ongoing repletion of electrolytes.  TPN labs reviewed: Potassium is low at 3.1 at trending down - PharmD has ordered 6 runs of K Sodium, Magnesium and  Phosphorus are currently WNL Blood sugars ranging from 96 - 109 Prealbumin is low at 12.8 Triglycerides elevated at 233  Pt also ordered for Clear Liquids at this time for comfort. Intake is minimal.  Height: Ht Readings from Last 1 Encounters:  09/12/13 5\' 3"  (1.6 m)    Weight: Wt Readings from Last 1 Encounters:  09/12/13 109 lb (49.442 kg)  Admit wt 108 lb - stable  BMI:  Body mass index is 19.31 kg/(m^2). Weight is WNL.  Estimated Nutritional Needs: Kcal: 1600-1800 Protein: 80-100 Fluid: > 1.8 L/day  Skin: no issues noted  Diet Order: Clear Liquid  EDUCATION NEEDS: -No education needs identified at this time   Intake/Output Summary (Last 24 hours) at 09/16/13 1043 Last data filed at 09/16/13 0856  Gross per 24 hour  Intake    340 ml  Output      0 ml  Net    340 ml    Last BM: 11/10  Labs:   Recent Labs Lab 09/13/13 0400 09/14/13 0455 09/15/13 0540 09/16/13 0535  NA 133* 136 133* 136  K 2.9* 3.7 3.2* 3.1*  CL 95* 98 97 97  CO2 24 24 26 27   BUN <3* <3* <3* 5*  CREATININE 0.40* 0.36* 0.37* 0.45*  CALCIUM 7.8* 8.1* 8.7 8.8  MG 1.7 2.1 2.1  --   PHOS 2.4 2.1* 3.2  --   GLUCOSE 51* 64* 122* 87   CBG (last 3)   Recent Labs  09/16/13 0007 09/16/13 0441 09/16/13 0737  GLUCAP 101* 96 109*   Prealbumin  Date/Time Value Range Status  09/15/2013  5:40 AM 12.8* 17.0 - 34.0 mg/dL Final     Performed at [facility]   Triglycerides  Date/Time Value Range Status  09/15/2013  5:40 AM 233* <150 mg/dL Final     Scheduled Meds: .  ceFAZolin (ANCEF) IV  2 g Intravenous On Call to OR  . heparin  5,000 Units Subcutaneous Once  . levothyroxine  75 mcg Intravenous Daily  . methadone  30 mg Oral Q8H  . nicotine  21 mg Transdermal Daily  . ondansetron (ZOFRAN) IV  4 mg Intravenous Once  . pantoprazole (PROTONIX) IV  40 mg Intravenous Q24H  . potassium chloride  10 mEq Intravenous Q1 Hr x 6  . sodium chloride  3 mL Intravenous Q12H     Continuous Infusions: . Marland KitchenTPN (CLINIMIX-E) Adult 40 mL/hr at 09/15/13 1729   And  . fat emulsion 240 mL (09/15/13 1730)  . Marland KitchenTPN (CLINIMIX-E) Adult     And  . fat emulsion     Jarold Motto MS, RD, LDN Pager: [phone] After-hours pager: [phone]

## 2013-09-16 NOTE — Progress Notes (Addendum)
PARENTERAL NUTRITION CONSULT NOTE - FOLLOW UP  Pharmacy Consult for TNA Indication: dysphagia, severe malnutrition related to esophageal cancer  Allergies  Allergen Reactions  . Erythromycin Nausea And Vomiting  . Morphine And Related Other (See Comments)    whelps on arms at iv site   Patient Measurements: Height: 5\' 3"  (160 cm) Weight: 109 lb (49.442 kg) IBW/kg (Calculated) : 52.4  Vital Signs: Temp: 97.9 F (36.6 C) (11/11 0720) Temp src: Oral (11/11 0720) BP: 119/73 mmHg (11/11 0720) Pulse Rate: 88 (11/11 0720) Intake/Output from previous day: 11/10 0701 - 11/11 0700 In: 120 [P.O.:120] Out: -  Intake/Output from this shift: Total I/O In: 220 [P.O.:220] Out: -   Labs:  Recent Labs  09/14/13 0455 09/15/13 0540  WBC 4.8 5.7  HGB 13.1 14.4  HCT 37.6 40.5  PLT 349 326     Recent Labs  09/14/13 0455 09/15/13 0540 09/16/13 0535  NA 136 133* 136  K 3.7 3.2* 3.1*  CL 98 97 97  CO2 24 26 27   GLUCOSE 64* 122* 87  BUN <3* <3* 5*  CREATININE 0.36* 0.37* 0.45*  CALCIUM 8.1* 8.7 8.8  MG 2.1 2.1  --   PHOS 2.1* 3.2  --   PROT 5.6* 6.2  --   ALBUMIN 2.4* 2.5*  --   AST 32 26  --   ALT 13 15  --   ALKPHOS 123* 133*  --   BILITOT 0.3 0.2*  --   PREALBUMIN 11.7* 12.8*  --   TRIG 182* 233*  --    Estimated Creatinine Clearance: 68.5 ml/min (by C-G formula based on Cr of 0.45).    Recent Labs  09/16/13 0007 09/16/13 0441 09/16/13 0737  GLUCAP 101* 96 109*    Insulin Requirements in the past 24 hours:   1 units SSI, cbgs 150, 101, 96, 109  Current Nutrition:  Clinimix E 5/15 at 46mL/hr + fat emulsion 20% at 13mL/hr- provides 48 grams of protein and 1162 kcal daily Clear liquid diet for enjoyment/comfort- poor appetite documented.  Assessment: 49 YOF with h/o primary distal esophagus/GE junction with met involvement who is severely malnourished and has been unable to eat for the past 2 days. She is scheduled for PEG placement 11/12.   Nutritional  Goals:  1600-1800 kCal, 80-100 grams of protein per day per RD assessment 11/8 GI: hx recurrent pancreatitis and sphincter of Oddi dysfunction, lipase stable currently; IV PPI Endo: hx Graves' disease- home dose Synthroid PO- here is being started on Synthroid IV d/t elevated TSH Lytes: Na 136 , K 3.1 after 5 runs yesterday, CorCa 10  Renal: SCr 0.45, CrCl 65-93mL/min Hepatobil: albumin low at 2.5, alkphos slightly elevated at 133, AST, ALT WNL, TBili slightly low at 0.2. Triglycerides 233 11/10- increase from 182 yesterday. Prealbumin 11.7 which indicated malnutrition Neuro: methadone 30mg  PO q8h ID/Onc: has not yet started FOLFOX chemo treatment; afebrile. No abx. Best Practices: subq hep TPN Access: Port-A-Cath TPN day#: 3 (ordered 11/8 but did not start until 11/9 due to miscommunication between RN, MD, and RPh)  Plan:  -will order 6 runs of K for K 3.1 -continue Clinimix E 5/15 at 43mL/hr-+ lipids at 10 ml/hr.  Will not advance rate until lytes repleted -goal will be Clinimix E 5/15 at rate of 56mL/hr with fat emulsion 20% at 10mL/hr which will provide a total of 84g protein and 1673 kcal daily] - DC SSI and CBGs - f/u am BMET, mag and phos - to  OR 11/12 for PEG placement, to start TF afterwards Herby Abraham, Pharm.D. [phone] 09/16/2013 8:57 AM

## 2013-09-16 NOTE — Progress Notes (Signed)
Subjective: Stable and alert. Does not appear to be in any distress.No new problems. Remains on TNA.  I spent some time counseling the patient regarding her feeding gastrostomy tube placement which is scheduled for tomorrow. Once again we discussed the indications, details, techniques, and numerous risks of the surgery. She understands all these issues. All her questions transferred. She agrees with this plan.    Objective: Vital signs in last 24 hours: Temp:  [97.6 F (36.4 C)-98 F (36.7 C)] 98 F (36.7 C) (11/11 0431) Pulse Rate:  [84-93] 93 (11/11 0431) Resp:  [18] 18 (11/11 0431) BP: (108-114)/(56-79) 114/77 mmHg (11/11 0431) SpO2:  [98 %-100 %] 98 % (11/11 0431) Last BM Date: 09/12/13  Intake/Output from previous day: 11/10 0701 - 11/11 0700 In: 120 [P.O.:120] Out: -  Intake/Output this shift:    EXAM: General appearance: alert. Cooperative. The condition but in no acute distress.  Lungs: Clear to auscultation bilaterally. No increased work of breathing.Port-A-Cath site clean. Heart: Regular rate and rhythm. No ectopy. GI: abdomen is soft. Nondistended. Some tenderness across the entire upper abdomen. Does not appear different than exam in my office last week. No palpable mass. Old scars from laparoscopic cholecystectomy and upper abdominal hernia repair.    Lab Results:   Recent Labs  09/14/13 0455 09/15/13 0540  WBC 4.8 5.7  HGB 13.1 14.4  HCT 37.6 40.5  PLT 349 326   BMET  Recent Labs  09/14/13 0455 09/15/13 0540  NA 136 133*  K 3.7 3.2*  CL 98 97  CO2 24 26  GLUCOSE 64* 122*  BUN <3* <3*  CREATININE 0.36* 0.37*  CALCIUM 8.1* 8.7   PT/INR No results found for this basename: LABPROT, INR,  in the last 72 hours ABG No results found for this basename: PHART, PCO2, PO2, HCO3,  in the last 72 hours  Studies/Results: Ir Fluoro Guide Cv Line Left  09/15/2013   CLINICAL DATA:  Multiple sclerosis, irritable bowel syndrome, poor venous access.   EXAM: MIDLINE CATHETER PLACEMENT WITH ULTRASOUND AND FLUOROSCOPY  TECHNIQUE: After written informed consent was obtained, patient was placed in the supine position on angiographic table. Left side approach was selected because of an indwelling right port catheter. Patency of the left basilic vein was confirmed with ultrasound with image documentation. An appropriate skin site was determined. Skin site was marked. Region was prepped using maximum barrier technique including cap and mask, sterile gown, sterile gloves, large sterile sheet, and Chlorhexidine as cutaneous antisepsis. The region was infiltrated locally with 1% lidocaine. Under real-time ultrasound guidance, the left basilic vein was accessed with a 21 gauge micropuncture needle; the needle tip within the vein was confirmed with ultrasound image documentation. Needle exchanged over a 018 guidewire for a peel-away sheath. The guidewire would not advance all way to the SVC, consistently diverted at the level of the left IJ vein. 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 A midline catheter placement was therefore performed. Through the peel-away sheath a 5-French double-lumen power injectable PICC trimmed to 27cm was advanced, positioned with its tip in the central aspect of the left subclavian vein. Spot chest radiograph confirms appropriate catheter position. Catheter was flushed per protocol and secured externally with 0-Prolene sutures. The patient tolerated procedure well, with no immediate complication.  FLUOROSCOPY TIME:  30 seconds  IMPRESSION: 1. Long segment left innominate vein occlusion. 2. Technically successful five [place] double lumen power injectable left arm midline catheter  placement   Electronically Signed   By: Oley Balm M.D.   On: 09/15/2013 10:39   Ir US Guide Vasc Access Left  09/15/2013   CLINICAL DATA:  Multiple sclerosis,  irritable bowel syndrome, poor venous access.  EXAM: MIDLINE CATHETER PLACEMENT WITH ULTRASOUND AND FLUOROSCOPY  TECHNIQUE: After written informed consent was obtained, patient was placed in the supine position on angiographic table. Left side approach was selected because of an indwelling right port catheter. Patency of the left basilic vein was confirmed with ultrasound with image documentation. An appropriate skin site was determined. Skin site was marked. Region was prepped using maximum barrier technique including cap and mask, sterile gown, sterile gloves, large sterile sheet, and Chlorhexidine as cutaneous antisepsis. The region was infiltrated locally with 1% lidocaine. Under real-time ultrasound guidance, the left basilic vein was accessed with a 21 gauge micropuncture needle; the needle tip within the vein was confirmed with ultrasound image documentation. Needle exchanged over a 018 guidewire for a peel-away sheath. The guidewire would not advance all way to the SVC, consistently diverted at the level of the left IJ vein. 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 A midline catheter placement was therefore performed. Through the peel-away sheath a 5-French double-lumen power injectable PICC trimmed to 27cm was advanced, positioned with its tip in the central aspect of the left subclavian vein. Spot chest radiograph confirms appropriate catheter position. Catheter was flushed per protocol and secured externally with 0-Prolene sutures. The patient tolerated procedure well, with no immediate complication.  FLUOROSCOPY TIME:  30 seconds  IMPRESSION: 1. Long segment left innominate vein occlusion. 2. Technically successful five [place] double lumen power injectable left arm midline catheter placement   Electronically Signed   By: Oley Balm M.D.   On: 09/15/2013 10:39    Anti-infectives: Anti-infectives    None      Assessment/Plan:  Proceed with gastrostomy feeding tube placement in operating room tomorrow.  Severe protein calorie malnutrition. TPN temporarily. Hopefully we can rapidly convert to enteral feedings postop to proceed with outpatient management.   Because of her metastatic disease to bone and distant lymph node involvement, she is apparently not a candidate for esophagectomy at any point in the future, according to Dr. Darrold Span. Agreed that we should consider palliative care consult at some point. Defer timing of this to Dr. Darrold Span, her primary oncologist.  Chronic pancreatitis  on methadone maintenance for drug history, Dr. Betti Cruz.  History laparoscopic cholecystectomy  History epigastric hernia repair  Recent Port-A-Cath placement  Status post treatment of Graves' disease, on replacement therapy  Multiple sclerosis, minimally symptomatic  Long QT syndrome Long history tobacco use    LOS: 4 days    Dona Klemann M. Derrell Lolling, M.D., [facility] General and Minimally invasive Surgery Breast and Colorectal Surgery Office:   [phone] Pager:   [phone]  09/16/2013

## 2013-09-16 NOTE — H&P (Signed)
[name]   MRN:  [id number]   Description: 47 year old female  Provider: Ernestene Mention, MD  Department: [facility]        Diagnoses    Esophageal cancer    -  Primary    150.9    Carcinoma of distal third of esophagus        150.5    Metastatic carcinoma to bone, unspecified laterality        198.5,  199.1    Protein-calorie malnutrition, severe        262    Multiple sclerosis        340      Reason for Visit    New Evaluation    eval for Peg Tube placement/ distal third esophaus Cancer        Current Vitals - Last Recorded    BP Pulse Temp(Src) Resp Ht Wt    120/68 88 97.6 F (36.4 C) (Temporal) 15 5\' 3"  (1.6 m) 109 lb 9.6 oz (49.714 kg)       BMI -19.42 kg/m2 04/28/2011                  History and Physical   Ernestene Mention, MD     Status: Addendum            Patient ID: [name], female   DOB: [birth year], 47 y.o.   MRN: [id number]              HPI DONASIA [name] is a 47 y.o. female.  She is referred by Dr. Jama Flavors for placement of a feeding gastrostomy tube. Her radiation oncologist is Dr. Lonie Peak. Her gastroenterologist is Dr. Jeani Hawking.. Her primary care physician is Dr. Fleet Contras. Her psychiatrist is Dr. Betti Cruz.   This patient has had a 30-40 pound weight loss over the past 2 years. She was hospitalized in the [facility] system in September with pneumonia, hypoxemia. She was found to have bilateral pneumonitis and an esophageal mass. Endoscopic ultrasound by Dr. [name] on 08/08/2013 showed a 5 cm long mass in the distal esophagus. Biopsy showed poorly differentiated adenocarcinoma. PET scan showed primary distal esophageal GE junction tumor with involvement of celiac axis as well as periaortics and bony metastases and T7, T12 and possibly T9 and T10. MRI of the brain is negative. CT scan shows the pneumonitis and esophageal tumor.She has just completed radiation therapy to the distal esophagus and GE junction. A Port-A-Cath  has been placed by radiology and she's going to start chemotherapy soon. She has been followed by Dr. Darrold Span for the past month.   She still has trouble swallowing. She cannot eat any solid food. She sometimes regurgitates liquids. She basically has severe protein calorie malnutrition. Dr. Jaynie Collins as they has told me that she will never be a candidate for an esophagectomy. Chemotherapy and radiation therapy are positive.   Past history is significant for chronic pancreatitis since cholecystectomy 1993. Has had sphincterotomy and stent placement. On methadone maintenance for drug history followed by Dr. Betti Cruz. While tobacco abuse just discontinued. History Port-A-Cath. Status post treatment of Graves' disease now on replacement. Multiple sclerosis but minimally symptomatic. Long QT syndrome. Severe protein calorie malnutrition. [facility] nutritionist has been involved.   Surgically she has had a laparoscopic cholecystectomy patient epigastric hernia repair as a child. She's had Port-A-Cath placed. She is here today with her mother.       Past Medical History  Diagnosis  Date   .  Multiple sclerosis     .  Irritable bowel syndrome     .  Arthritis     .  Pancreatitis     .  Anxiety     .  Depression     .  Chronic pain     .  Migraine     .  PTSD (post-traumatic stress disorder)     .  Methadone use     .  PONV (postoperative nausea and vomiting)     .  Esophageal cancer  07/24/13       Invasive Adenocarcinoma   .  Grave's disease     .  hypothyroidism     .  Neuromuscular disorder     .  Substance abuse           Past Surgical History   Procedure  Laterality  Date   .  Sphincterotomy       .  Cholecystectomy       .  Eus  N/A  08/08/2013       Procedure: ESOPHAGEAL ENDOSCOPIC ULTRASOUND (EUS) RADIAL;  Surgeon: Theda Belfast, MD;  Location: [facility];  Service: Endoscopy;  Laterality: N/A;   .  Biopsy of esophagus    07/24/13       Poorly differentiatied Adenocarcinoma of the  Lower Third of the Esophagus   .  Hernia repair             Family History   Problem  Relation  Age of Onset   .  Cancer  Mother         ovarian, colon, breast   .  Hypertension  Father     .  Cancer  Maternal Uncle         lung   .  Cancer  Maternal Grandmother         lung        Social History History   Substance Use Topics   .  Smoking status:  Current Every Day Smoker -- 0.50 packs/day for 8 years   .  Smokeless tobacco:  Never Used   .  Alcohol Use:  No         Allergies   Allergen  Reactions   .  Erythromycin  Nausea And Vomiting   .  Morphine And Related  Other (See Comments)       whelps on arms at iv site         Current Outpatient Prescriptions   Medication  Sig  Dispense  Refill   .  Alum & Mag Hydroxide-Simeth (MAGIC MOUTHWASH W/LIDOCAINE) SOLN  1part nystatin,1part Maaloxplus,1part benadryl,3part 2%viscous lidocaine. swallow 10 mL up to QID, before meals/bedtime   480 mL   3   .  busPIRone (BUSPAR) 15 MG tablet  Take 15 mg by mouth 2 (two) times daily.         Marland Kitchen  escitalopram (LEXAPRO) 10 MG tablet  Take 30 mg by mouth daily after supper.          .  esomeprazole (NEXIUM) 20 MG capsule  Take 20 mg by mouth daily before breakfast.         .  imipramine (TOFRANIL) 50 MG tablet  Take 100 mg by mouth at bedtime.          Marland Kitchen  levothyroxine (SYNTHROID, LEVOTHROID) 100 MCG tablet  Take 100 mcg by mouth daily before breakfast.          .  lidocaine-prilocaine (EMLA) cream  Apply 1-2 hours prior to Porta-Cath accessed   30 g   2   .  Melatonin 3 MG TABS  Take 3-6 mg by mouth at bedtime as needed (For sleep.).          Marland Kitchen  methadone (DOLOPHINE) 10 MG/5ML solution  Take 94 mg by mouth every morning.          .  ondansetron (ZOFRAN) 8 MG tablet  Take 8 mg by mouth every 8 (eight) hours as needed for nausea.         Marland Kitchen  oxyCODONE (ROXICODONE) 5 MG/5ML solution  Take 5-10 mLs (5-10 mg total) by mouth every 4 (four) hours as needed for pain.   500 mL   0   .   Multiple Vitamin (MULTIVITAMIN WITH MINERALS) TABS  Take 1 tablet by mouth at bedtime.          .  nicotine (NICODERM CQ - DOSED IN MG/24 HOURS) 14 mg/24hr patch  Place 1 patch onto the skin daily.         .  potassium chloride (K-DUR,KLOR-CON) 10 MEQ tablet  Take 10 mEq by mouth 2 (two) times daily.      Review of Systems   Constitutional: Positive for activity change, appetite change, fatigue and unexpected weight change. Negative for fever and chills.  HENT: Negative for congestion, hearing loss, sore throat, trouble swallowing and voice change.   Eyes: Negative for visual disturbance.  Respiratory: Positive for cough and shortness of breath. Negative for wheezing.   Cardiovascular: Negative for chest pain, palpitations and leg swelling.  Gastrointestinal: Positive for vomiting and abdominal pain. Negative for nausea, diarrhea, constipation, blood in stool, abdominal distention and anal bleeding.  Genitourinary: Negative for hematuria, vaginal bleeding and difficulty urinating.  Musculoskeletal: Negative for arthralgias.  Skin: Positive for color change. Negative for rash and wound.  Neurological: Negative for seizures, syncope and headaches.  Hematological: Negative for adenopathy. Does not bruise/bleed easily.  Psychiatric/Behavioral: Negative for confusion.      Blood pressure 120/68, pulse 88, temperature 97.6 F (36.4 C), temperature source Temporal, resp. rate 15, height 5\' 3"  (1.6 m), weight 109 lb 9.6 oz (49.714 kg), last menstrual period 04/28/2011.   Physical Exam  Constitutional: She is oriented to person, place, and time. No distress.  Pale. Cachectic.  HENT:   Head: Normocephalic and atraumatic.   Nose: Nose normal.   Mouth/Throat: No oropharyngeal exudate.  Eyes: Conjunctivae and EOM are normal. Pupils are equal, round, and reactive to light. Left eye exhibits no discharge. No scleral icterus.  Neck: Neck supple. No JVD present. No tracheal deviation present.  No thyromegaly present.  No gross adenopathy.  Cardiovascular: Normal rate, regular rhythm, normal heart sounds and intact distal pulses.    No murmur heard. Pulmonary/Chest: Effort normal and breath sounds normal. No respiratory distress. She has no wheezes. She has no rales. She exhibits no tenderness.  Port-A-Cath right infraclavicular area with catheter going up into right internal jugular vein. No infection.  Abdominal: Soft. Bowel sounds are normal. She exhibits no distension and no mass. There is tenderness. There is no rebound and no guarding.  Short transverse scar right epigastrium. No hernia. Laparoscopic scars. Seems mildly diffusely tender across the upper abdomen.  Musculoskeletal: She exhibits no edema and no tenderness.  Lymphadenopathy:    She has no cervical adenopathy.  Neurological: She is alert and oriented to person, place, and time. She exhibits normal muscle tone. Coordination normal.  Skin: Skin is warm. No rash noted. She is not diaphoretic. No erythema. No pallor.  Psychiatric: Her behavior is normal. Judgment and thought content normal.  Somewhat depressed, flattened affect.      Data Reviewed Communication with Dr. Darrold Span. Recent cancer center notes. All imaging studies. All histopathology.   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   Severe protein calorie malnutrition   Chronic pancreatitis   Effort on methadone maintenance for drug history, Dr. Betti Cruz.   History laparoscopic cholecystectomy   History epigastric hernia repair   Recent Port-A-Cath placement   Status post treatment of Graves' disease, on replacement therapy   Multiple sclerosis, minimally symptomatic   Long QT syndrome      Plan    We'll schedule for a feeding gastrostomy tube placement under general anesthesia. This may or may not be difficult because of her previous  surgeries and because of her neoplastic disease and pancreatitis history.    We will admit her to the hospital for one or 2 nights to make sure she is stable .  Involve nutritional counselor to help with tube feeding selection. We need to be sure to avoid the overfeeding syndrome.   We will check her thyroid function testing and coagulation status preop just in case   I discussed the indications, details, techniques, and numerous risk of the surgery with the patient and her mother. There were where the risk of bleeding, infection, poor wound healing, hernia, dehiscence, intra-abdominal surgical complications, susceptibility to infection and pneumonia, metabolic problems with overfeeding. They understand these issues well. All their questions were answered. They agree with this plan.     Angelia Mould. Derrell Lolling, M.D., [facility] General and Minimally invasive Surgery Breast and Colorectal Surgery Office:   [phone] Pager:   [phone]

## 2013-09-17 ENCOUNTER — Inpatient Hospital Stay (HOSPITAL_COMMUNITY): Payer: Medicare Other | Admitting: Anesthesiology

## 2013-09-17 ENCOUNTER — Ambulatory Visit (HOSPITAL_COMMUNITY): Admission: RE | Admit: 2013-09-17 | Payer: Medicare Other | Source: Ambulatory Visit | Admitting: General Surgery

## 2013-09-17 ENCOUNTER — Ambulatory Visit: Payer: Medicare Other

## 2013-09-17 ENCOUNTER — Encounter (HOSPITAL_COMMUNITY): Payer: Medicare Other | Admitting: Anesthesiology

## 2013-09-17 ENCOUNTER — Encounter (HOSPITAL_COMMUNITY)
Admission: EM | Disposition: A | Discharge status: Home / Self Care | Payer: Self-pay | Source: Home / Self Care | Attending: Internal Medicine

## 2013-09-17 ENCOUNTER — Encounter (HOSPITAL_COMMUNITY): Payer: Self-pay | Admitting: Anesthesiology

## 2013-09-17 DIAGNOSIS — J189 Pneumonia, unspecified organism: Secondary | ICD-10-CM

## 2013-09-17 HISTORY — PX: GASTROSTOMY: SHX5249

## 2013-09-17 LAB — BASIC METABOLIC PANEL
BUN: 5 mg/dL — ABNORMAL LOW (ref 6–23)
CO2: 27 mEq/L (ref 19–32)
Calcium: 8.7 mg/dL (ref 8.4–10.5)
Chloride: 97 mEq/L (ref 96–112)
GFR calc non Af Amer: >90 mL/min (ref >90)
Glucose, Bld: 107 mg/dL — ABNORMAL HIGH (ref 70–99)
Sodium: 135 mEq/L (ref 135–145)

## 2013-09-17 LAB — PHOSPHORUS: Phosphorus: 4.1 mg/dL (ref 2.3–4.6)

## 2013-09-17 LAB — SURGICAL PCR SCREEN
MRSA, PCR: NEGATIVE
Staphylococcus aureus: NEGATIVE

## 2013-09-17 SURGERY — INSERTION OF GASTROSTOMY TUBE
Anesthesia: General | Site: Abdomen | Wound class: Clean Contaminated

## 2013-09-17 MED ORDER — HYDROMORPHONE HCL PF 1 MG/ML IJ SOLN
INTRAMUSCULAR | Status: AC
  Administered 2013-09-17: 0.5 mg via INTRAVENOUS
  Filled 2013-09-17: qty 1

## 2013-09-17 MED ORDER — DEXAMETHASONE SODIUM PHOSPHATE 4 MG/ML IJ SOLN
INTRAMUSCULAR | Status: DC | PRN
  Administered 2013-09-17: 4 mg via INTRAVENOUS

## 2013-09-17 MED ORDER — GLYCOPYRROLATE 0.2 MG/ML IJ SOLN
INTRAMUSCULAR | Status: DC | PRN
  Administered 2013-09-17: 0.6 mg via INTRAVENOUS

## 2013-09-17 MED ORDER — BUPIVACAINE-EPINEPHRINE 0.5% -1:200000 IJ SOLN
INTRAMUSCULAR | Status: DC | PRN
  Administered 2013-09-17: 9 mL

## 2013-09-17 MED ORDER — OSMOLITE 1.2 CAL PO LIQD
1000.0000 mL | ORAL | Status: DC
  Administered 2013-09-17: 21:00:00 1000 mL
  Filled 2013-09-17 (×5): qty 1000

## 2013-09-17 MED ORDER — METOCLOPRAMIDE HCL 5 MG/ML IJ SOLN: 10.0000 mg | Freq: Once | INTRAMUSCULAR | Status: DC | PRN

## 2013-09-17 MED ORDER — LACTATED RINGERS IV SOLN
INTRAVENOUS | Status: DC
  Administered 2013-09-17: 10:00:00 via INTRAVENOUS

## 2013-09-17 MED ORDER — ROCURONIUM BROMIDE 100 MG/10ML IV SOLN
INTRAVENOUS | Status: DC | PRN
  Administered 2013-09-17: 40 mg via INTRAVENOUS

## 2013-09-17 MED ORDER — FENTANYL CITRATE 0.05 MG/ML IJ SOLN
INTRAMUSCULAR | Status: DC | PRN
  Administered 2013-09-17: 50 ug via INTRAVENOUS
  Administered 2013-09-17: 100 ug via INTRAVENOUS
  Administered 2013-09-17 (×2): 50 ug via INTRAVENOUS
  Administered 2013-09-17: 150 ug via INTRAVENOUS
  Administered 2013-09-17 (×2): 50 ug via INTRAVENOUS

## 2013-09-17 MED ORDER — ONDANSETRON HCL 4 MG PO TABS
4.0000 mg | ORAL_TABLET | Freq: Four times a day (QID) | ORAL | Status: DC | PRN
  Filled 2013-09-17: qty 1

## 2013-09-17 MED ORDER — ONDANSETRON HCL 4 MG/2ML IJ SOLN: INTRAMUSCULAR | Status: DC | PRN

## 2013-09-17 MED ORDER — SUCCINYLCHOLINE CHLORIDE 20 MG/ML IJ SOLN
INTRAMUSCULAR | Status: DC | PRN
  Administered 2013-09-17: 100 mg via INTRAVENOUS

## 2013-09-17 MED ORDER — TRACE MINERALS CR-CU-F-FE-I-MN-MO-SE-ZN IV SOLN
INTRAVENOUS | Status: AC
  Administered 2013-09-17: 18:00:00 via INTRAVENOUS
  Filled 2013-09-17: qty 2000

## 2013-09-17 MED ORDER — LORAZEPAM 2 MG/ML IJ SOLN
INTRAMUSCULAR | Status: AC
  Administered 2013-09-17: 1 mg via INTRAVENOUS
  Filled 2013-09-17: qty 1

## 2013-09-17 MED ORDER — CEFAZOLIN SODIUM-DEXTROSE 2-3 GM-% IV SOLR
INTRAVENOUS | Status: AC
  Administered 2013-09-17: 2 g via INTRAVENOUS
  Filled 2013-09-17: qty 50

## 2013-09-17 MED ORDER — ONDANSETRON HCL 4 MG/2ML IJ SOLN
4.0000 mg | Freq: Four times a day (QID) | INTRAMUSCULAR | Status: DC | PRN
  Administered 2013-09-18 – 2013-09-20 (×7): 4 mg via INTRAVENOUS
  Filled 2013-09-17 (×8): qty 2

## 2013-09-17 MED ORDER — NEOSTIGMINE METHYLSULFATE 1 MG/ML IJ SOLN
INTRAMUSCULAR | Status: DC | PRN
  Administered 2013-09-17: 4 mg via INTRAVENOUS

## 2013-09-17 MED ORDER — ONDANSETRON HCL 4 MG/2ML IJ SOLN
INTRAMUSCULAR | Status: DC | PRN
  Administered 2013-09-17: 4 mg via INTRAVENOUS

## 2013-09-17 MED ORDER — HYDROCODONE-ACETAMINOPHEN 5-325 MG PO TABS
1.0000 | ORAL_TABLET | ORAL | Status: DC | PRN
  Filled 2013-09-17: qty 2

## 2013-09-17 MED ORDER — LORAZEPAM 2 MG/ML IJ SOLN
1.0000 mg | Freq: Once | INTRAMUSCULAR | Status: AC
  Administered 2013-09-17: 1 mg via INTRAVENOUS

## 2013-09-17 MED ORDER — MIDAZOLAM HCL 5 MG/5ML IJ SOLN
INTRAMUSCULAR | Status: DC | PRN
  Administered 2013-09-17: 2 mg via INTRAVENOUS

## 2013-09-17 MED ORDER — OXYCODONE HCL 5 MG/5ML PO SOLN: 5.0000 mg | Freq: Once | ORAL | Status: DC | PRN

## 2013-09-17 MED ORDER — BUPIVACAINE-EPINEPHRINE (PF) 0.5% -1:200000 IJ SOLN
INTRAMUSCULAR | Status: AC
  Filled 2013-09-17: qty 10

## 2013-09-17 MED ORDER — FENTANYL CITRATE 0.05 MG/ML IJ SOLN
12.5000 ug | INTRAMUSCULAR | Status: DC | PRN
  Administered 2013-09-19 – 2013-09-20 (×4): 25 ug via INTRAVENOUS
  Filled 2013-09-17 (×4): qty 2

## 2013-09-17 MED ORDER — LACTATED RINGERS IV SOLN
INTRAVENOUS | Status: DC | PRN
  Administered 2013-09-17: 11:00:00 via INTRAVENOUS

## 2013-09-17 MED ORDER — FAT EMULSION 20 % IV EMUL
240.0000 mL | INTRAVENOUS | Status: AC
  Administered 2013-09-17: 240 mL via INTRAVENOUS
  Filled 2013-09-17: qty 250

## 2013-09-17 MED ORDER — POTASSIUM CHLORIDE IN NACL 20-0.9 MEQ/L-% IV SOLN
INTRAVENOUS | Status: DC
  Filled 2013-09-17: qty 1000

## 2013-09-17 MED ORDER — ENOXAPARIN SODIUM 40 MG/0.4ML ~~LOC~~ SOLN
40.0000 mg | SUBCUTANEOUS | Status: DC
  Administered 2013-09-18 – 2013-09-20 (×2): 40 mg via SUBCUTANEOUS
  Filled 2013-09-17 (×3): qty 0.4

## 2013-09-17 MED ORDER — OXYCODONE HCL 5 MG PO TABS: 5.0000 mg | ORAL_TABLET | Freq: Once | ORAL | Status: DC | PRN

## 2013-09-17 MED ORDER — PROPOFOL 10 MG/ML IV BOLUS
INTRAVENOUS | Status: DC | PRN
  Administered 2013-09-17: 140 mg via INTRAVENOUS

## 2013-09-17 MED ORDER — HYDROMORPHONE HCL PF 1 MG/ML IJ SOLN
0.2500 mg | INTRAMUSCULAR | Status: DC | PRN
  Administered 2013-09-17 (×7): 0.5 mg via INTRAVENOUS

## 2013-09-17 SURGICAL SUPPLY — 61 items
APL SKNCLS STERI-STRIP NONHPOA (GAUZE/BANDAGES/DRESSINGS) ×1
BENZOIN TINCTURE PRP APPL 2/3 (GAUZE/BANDAGES/DRESSINGS) ×1 IMPLANT
BLADE SURG ROTATE 9660 (MISCELLANEOUS) IMPLANT
CANISTER SUCTION 2500CC (MISCELLANEOUS) ×2 IMPLANT
CATH ROBINSON RED A/P 18FR (CATHETERS) ×1 IMPLANT
CHLORAPREP W/TINT 26ML (MISCELLANEOUS) ×2 IMPLANT
COVER MAYO STAND STRL (DRAPES) IMPLANT
COVER SURGICAL LIGHT HANDLE (MISCELLANEOUS) ×2 IMPLANT
DRAPE LAPAROSCOPIC ABDOMINAL (DRAPES) ×2 IMPLANT
DRAPE PROXIMA HALF (DRAPES) IMPLANT
DRAPE UTILITY 15X26 W/TAPE STR (DRAPE) ×4 IMPLANT
DRAPE WARM FLUID 44X44 (DRAPE) ×1 IMPLANT
DRSG OPSITE POSTOP 4X10 (GAUZE/BANDAGES/DRESSINGS) IMPLANT
DRSG OPSITE POSTOP 4X8 (GAUZE/BANDAGES/DRESSINGS) IMPLANT
ELECT BLADE 6.5 EXT (BLADE) IMPLANT
ELECT CAUTERY BLADE 6.4 (BLADE) ×3 IMPLANT
ELECT REM PT RETURN 9FT ADLT (ELECTROSURGICAL) ×2
ELECTRODE REM PT RTRN 9FT ADLT (ELECTROSURGICAL) ×1 IMPLANT
GLOVE BIO SURGEON STRL SZ7.5 (GLOVE) ×1 IMPLANT
GLOVE BIOGEL PI IND STRL 6.5 (GLOVE) IMPLANT
GLOVE BIOGEL PI IND STRL 7.5 (GLOVE) IMPLANT
GLOVE BIOGEL PI INDICATOR 6.5 (GLOVE) ×2
GLOVE BIOGEL PI INDICATOR 7.5 (GLOVE) ×1
GLOVE EUDERMIC 7 POWDERFREE (GLOVE) ×2 IMPLANT
GOWN STRL NON-REIN LRG LVL3 (GOWN DISPOSABLE) ×3 IMPLANT
GOWN STRL REIN XL XLG (GOWN DISPOSABLE) ×2 IMPLANT
KIT BASIN OR (CUSTOM PROCEDURE TRAY) ×2 IMPLANT
KIT ROOM TURNOVER OR (KITS) ×2 IMPLANT
LIGASURE IMPACT 36 18CM CVD LR (INSTRUMENTS) IMPLANT
NDL HYPO 25GX1X1/2 BEV (NEEDLE) IMPLANT
NEEDLE HYPO 25GX1X1/2 BEV (NEEDLE) ×2 IMPLANT
NS IRRIG 1000ML POUR BTL (IV SOLUTION) ×3 IMPLANT
PACK GENERAL/GYN (CUSTOM PROCEDURE TRAY) ×2 IMPLANT
PAD ARMBOARD 7.5X6 YLW CONV (MISCELLANEOUS) ×2 IMPLANT
PENCIL BUTTON HOLSTER BLD 10FT (ELECTRODE) IMPLANT
PLUG CATH AND CAP STER (CATHETERS) ×1 IMPLANT
SPECIMEN JAR LARGE (MISCELLANEOUS) IMPLANT
SPONGE GAUZE 4X4 12PLY (GAUZE/BANDAGES/DRESSINGS) ×1 IMPLANT
SPONGE LAP 18X18 X RAY DECT (DISPOSABLE) IMPLANT
STAPLER VISISTAT 35W (STAPLE) ×2 IMPLANT
STRIP CLOSURE SKIN 1/2X4 (GAUZE/BANDAGES/DRESSINGS) ×1 IMPLANT
SUCTION POOLE TIP (SUCTIONS) ×2 IMPLANT
SUT ETHILON 2 0 FS 18 (SUTURE) ×2 IMPLANT
SUT MNCRL AB 4-0 PS2 18 (SUTURE) ×1 IMPLANT
SUT PDS AB 1 CT  36 (SUTURE) ×1
SUT PDS AB 1 CT 36 (SUTURE) IMPLANT
SUT PDS AB 1 TP1 96 (SUTURE) ×2 IMPLANT
SUT SILK 2 0 SH CR/8 (SUTURE) ×2 IMPLANT
SUT SILK 2 0 TIES 10X30 (SUTURE) ×2 IMPLANT
SUT SILK 3 0 SH CR/8 (SUTURE) ×3 IMPLANT
SUT SILK 3 0 TIES 10X30 (SUTURE) ×2 IMPLANT
SUT VIC AB 3-0 SH 27 (SUTURE) ×2
SUT VIC AB 3-0 SH 27XBRD (SUTURE) IMPLANT
SYR CONTROL 10ML LL (SYRINGE) ×1 IMPLANT
SYR TOOMEY 50ML (SYRINGE) ×1 IMPLANT
TOWEL OR 17X24 6PK STRL BLUE (TOWEL DISPOSABLE) ×1 IMPLANT
TOWEL OR 17X26 10 PK STRL BLUE (TOWEL DISPOSABLE) ×2 IMPLANT
TRAY FOLEY CATH 14FRSI W/METER (CATHETERS) ×1 IMPLANT
TRAY FOLEY CATH 16FRSI W/METER (SET/KITS/TRAYS/PACK) IMPLANT
TUBE CONNECTING 12X1/4 (SUCTIONS) IMPLANT
YANKAUER SUCT BULB TIP NO VENT (SUCTIONS) IMPLANT

## 2013-09-17 NOTE — Op Note (Signed)
Patient Name:           [name]   Date of Surgery:        09/17/2013  Pre op Diagnosis:      Stage IV esophageal cancer Severe protein calorie malnutrition  Post op Diagnosis:    Same  Procedure:                 Exploratory laparotomy, feeding jejunostomy with 18 French multiholed red rubber catheter  Surgeon:                     Angelia Mould. Derrell Lolling, M.D., FACS  Assistant:                      None  Operative Indications:   [name] is a 47 y.o. Female who was referred by Dr. Jama Flavors for placement of a feeding gastrostomy tube. This patient has had a 30-40 pound weight loss over the past 2 years. She was hospitalized in the [facility] system in September with pneumonia, hypoxemia. She was found to have bilateral pneumonitis and an esophageal mass. Endoscopic ultrasound by Dr. [name] on 08/08/2013 showed a 5 cm long mass in the distal esophagus. Biopsy showed poorly differentiated adenocarcinoma. PET scan showed primary distal esophageal GE junction tumor with involvement of celiac axis as well as periaortics and bony metastases and T7, T12 and possibly T9 and T10. MRI of the brain is negative. CT scan shows the pneumonitis and esophageal tumor.She has just completed radiation therapy to the distal esophagus and GE junction. A Port-A-Cath has been placed by radiology and she's going to start chemotherapy soon. She has been followed by Dr. Darrold Span for the past month.  She still has trouble swallowing. She cannot eat any solid food. She sometimes regurgitates liquids. She basically has severe protein calorie malnutrition. I evaluated her last week and scheduled feeding gastrostomy tube, but in the interim she had to be admitted for severe dehydration and was started on hyperalimentation by the internal medicine service. Past history is significant for chronic pancreatitis since cholecystectomy 1993. Has had sphincterotomy and stent placement. On methadone maintenance for drug history followed by  Dr. Betti Cruz. Tobacco abuse just discontinued. History Port-A-Cath. Status post treatment of Graves' disease now on replacement. Multiple sclerosis but minimally symptomatic. Long QT syndrome.   Surgically she has had a laparoscopic cholecystectomy and epigastric hernia repair as a child. She is brought to the operating room after a 4-1/2 day hospitalization for hyperalimentation and stabilization of her medical problems.   Operative Findings:       We were unable to place a gastrostomy tube because the retrogastric retroperitoneum was filled with tumor, and the stomach was adherent and fixed  to it, shrunken in size, and would not lift up to the abdominal wall. For that reason I placed a feeding jejunostomy tube approximately 10 inches distal to the ligament of Treitz.  Procedure in Detail:          Following the induction of general endotracheal anesthesia, intravenous antibiotics were given, surgical time out performed, and the abdomen prepped and draped in a sterile fashion. 0.5% Marcaine with epinephrine was used as local infiltration anesthetic. A short upper midline incision was made. The fascia was incised in the midline. We entered the abdomen without any significant adhesions. We identified the transverse colon and omentum. We explore cephalad and found that the stomach was much smaller than normal and was fixed to  the retroperitoneum by obvious bulky palpable tumor mass. We assessed the situation and felt that there was no way to safely place a gastrostomy tube. I therefore chose to place a feeding jejunostomy tube. We lifted up the transverse colon and ran the small bowel proximally and distally until we clearly identified the ligament of Treitz. I marked the proximal jejunum about 10-12 inches distal to the ligament of Treitz. I performed an enterotomy with cautery and hemostat. I placed a pursestring suture of 3-0 Vicryl around the enterotomy. I brought a 18 French red rubber catheter the field,  cut off the tip, and cut 3 or 4 sideholes in this. I inserted the red rubber catheter through the enterotomy and threaded it about 20 cm distally. I tied the pursestring suture. I then created a Witzl tunnel with several interrupted sutures of 3-0 silk. I found that I could flush the catheter easily. The loop of small bowel and the Witzel tunnel with the tube were tacked to the left upper quadrant abdominal wall with numerous interrupted sutures of 3-0 silk. I then sutured the red rubber catheter to the skin with 2 separate sutures of 2-0 nylon. I flushed the catheter again with saline. . A cork was placed in the end of the catheter. I checked within the abdomen and found no bleeding. The transverse colon and omentum were returned to their anatomic position.The small bowel lay nicely and there was no twisting.The midline fascia was closed with a running suture of #1 PDS the skin closed with running subcuticular suture of 4-0 Monocryl and Steri-Strips. Bandages were placed and the patient taken to recovery in stable condition. EBL 10 cc. Counts correct. Complications none.     Angelia Mould. Derrell Lolling, M.D., FACS General and Minimally Invasive Surgery Breast and Colorectal Surgery  09/17/2013 12:11 PM

## 2013-09-17 NOTE — H&P (View-Only) (Signed)
Subjective: Stable and alert. Does not appear to be in any distress.No new problems. Remains on TNA.  I spent some time counseling the patient regarding her feeding gastrostomy tube placement which is scheduled for tomorrow. Once again we discussed the indications, details, techniques, and numerous risks of the surgery. She understands all these issues. All her questions transferred. She agrees with this plan.    Objective: Vital signs in last 24 hours: Temp:  [97.6 F (36.4 C)-98 F (36.7 C)] 98 F (36.7 C) (11/11 0431) Pulse Rate:  [84-93] 93 (11/11 0431) Resp:  [18] 18 (11/11 0431) BP: (108-114)/(56-79) 114/77 mmHg (11/11 0431) SpO2:  [98 %-100 %] 98 % (11/11 0431) Last BM Date: 09/12/13  Intake/Output from previous day: 11/10 0701 - 11/11 0700 In: 120 [P.O.:120] Out: -  Intake/Output this shift:    EXAM: General appearance: alert. Cooperative. The condition but in no acute distress.  Lungs: Clear to auscultation bilaterally. No increased work of breathing.Port-A-Cath site clean. Heart: Regular rate and rhythm. No ectopy. GI: abdomen is soft. Nondistended. Some tenderness across the entire upper abdomen. Does not appear different than exam in my office last week. No palpable mass. Old scars from laparoscopic cholecystectomy and upper abdominal hernia repair.    Lab Results:   Recent Labs  09/14/13 0455 09/15/13 0540  WBC 4.8 5.7  HGB 13.1 14.4  HCT 37.6 40.5  PLT 349 326   BMET  Recent Labs  09/14/13 0455 09/15/13 0540  NA 136 133*  K 3.7 3.2*  CL 98 97  CO2 24 26  GLUCOSE 64* 122*  BUN <3* <3*  CREATININE 0.36* 0.37*  CALCIUM 8.1* 8.7   PT/INR No results found for this basename: LABPROT, INR,  in the last 72 hours ABG No results found for this basename: PHART, PCO2, PO2, HCO3,  in the last 72 hours  Studies/Results: Ir Fluoro Guide Cv Line Left  09/15/2013   CLINICAL DATA:  Multiple sclerosis, irritable bowel syndrome, poor venous access.   EXAM: MIDLINE CATHETER PLACEMENT WITH ULTRASOUND AND FLUOROSCOPY  TECHNIQUE: After written informed consent was obtained, patient was placed in the supine position on angiographic table. Left side approach was selected because of an indwelling right port catheter. Patency of the left basilic vein was confirmed with ultrasound with image documentation. An appropriate skin site was determined. Skin site was marked. Region was prepped using maximum barrier technique including cap and mask, sterile gown, sterile gloves, large sterile sheet, and Chlorhexidine as cutaneous antisepsis. The region was infiltrated locally with 1% lidocaine. Under real-time ultrasound guidance, the left basilic vein was accessed with a 21 gauge micropuncture needle; the needle tip within the vein was confirmed with ultrasound image documentation. Needle exchanged over a 018 guidewire for a peel-away sheath. The guidewire would not advance all way to the SVC, consistently diverted at the level of the left IJ vein. 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 A midline catheter placement was therefore performed. Through the peel-away sheath a 5-French double-lumen power injectable PICC trimmed to 27cm was advanced, positioned with its tip in the central aspect of the left subclavian vein. Spot chest radiograph confirms appropriate catheter position. Catheter was flushed per protocol and secured externally with 0-Prolene sutures. The patient tolerated procedure well, with no immediate complication.  FLUOROSCOPY TIME:  30 seconds  IMPRESSION: 1. Long segment left innominate vein occlusion. 2. Technically successful five French double lumen power injectable left arm midline catheter   placement   Electronically Signed   By: Daniel  Hassell M.D.   On: 09/15/2013 10:39   Ir Us Guide Vasc Access Left  09/15/2013   CLINICAL DATA:  Multiple sclerosis,  irritable bowel syndrome, poor venous access.  EXAM: MIDLINE CATHETER PLACEMENT WITH ULTRASOUND AND FLUOROSCOPY  TECHNIQUE: After written informed consent was obtained, patient was placed in the supine position on angiographic table. Left side approach was selected because of an indwelling right port catheter. Patency of the left basilic vein was confirmed with ultrasound with image documentation. An appropriate skin site was determined. Skin site was marked. Region was prepped using maximum barrier technique including cap and mask, sterile gown, sterile gloves, large sterile sheet, and Chlorhexidine as cutaneous antisepsis. The region was infiltrated locally with 1% lidocaine. Under real-time ultrasound guidance, the left basilic vein was accessed with a 21 gauge micropuncture needle; the needle tip within the vein was confirmed with ultrasound image documentation. Needle exchanged over a 018 guidewire for a peel-away sheath. The guidewire would not advance all way to the SVC, consistently diverted at the level of the left IJ vein. 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 A midline catheter placement was therefore performed. Through the peel-away sheath a 5-French double-lumen power injectable PICC trimmed to 27cm was advanced, positioned with its tip in the central aspect of the left subclavian vein. Spot chest radiograph confirms appropriate catheter position. Catheter was flushed per protocol and secured externally with 0-Prolene sutures. The patient tolerated procedure well, with no immediate complication.  FLUOROSCOPY TIME:  30 seconds  IMPRESSION: 1. Long segment left innominate vein occlusion. 2. Technically successful five French double lumen power injectable left arm midline catheter placement   Electronically Signed   By: Daniel  Hassell M.D.   On: 09/15/2013 10:39    Anti-infectives: Anti-infectives    None      Assessment/Plan:  Proceed with gastrostomy feeding tube placement in operating room tomorrow.  Severe protein calorie malnutrition. TPN temporarily. Hopefully we can rapidly convert to enteral feedings postop to proceed with outpatient management.   Because of her metastatic disease to bone and distant lymph node involvement, she is apparently not a candidate for esophagectomy at any point in the future, according to Dr. Livesay. Agreed that we should consider palliative care consult at some point. Defer timing of this to Dr. Livesay, her primary oncologist.  Chronic pancreatitis  on methadone maintenance for drug history, Dr. Reddy.  History laparoscopic cholecystectomy  History epigastric hernia repair  Recent Port-A-Cath placement  Status post treatment of Graves' disease, on replacement therapy  Multiple sclerosis, minimally symptomatic  Long QT syndrome Long history tobacco use    LOS: 4 days    Tressie Ragin M. Jamyria Ozanich, M.D., FACS [facility] General and Minimally invasive Surgery Breast and Colorectal Surgery Office:   [phone] Pager:   [phone]  09/16/2013  

## 2013-09-17 NOTE — Interval H&P Note (Signed)
History and Physical Interval Note:  09/17/2013 8:14 AM  [name]  has presented today for surgery, with the diagnosis of esophageal cancer  The various methods of treatment have been discussed with the patient and family. After consideration of risks, benefits and other options for treatment, the patient has consented to  Procedure(s): GASTROSTOMY (N/A) as a surgical intervention .  The patient's history has been reviewed, patient examined, no change in status, stable for surgery.  I have reviewed the patient's chart and labs.  Questions were answered to the patient's satisfaction.     Ernestene Mention

## 2013-09-17 NOTE — Progress Notes (Signed)
[facility]                                                                                Patient Demographics  [name], is a 47 y.o. female, DOB - 1966/03/11, OZH:[id number]  Admit date - 09/12/2013   Admitting Physician [name], MD  Outpatient Primary MD for the patient is [name], MD  LOS - 5  Subjective:   She denies any specific complaints today, a little bit anxious about her scheduled procedure.  Chief Complaint  Patient presents with  . Abdominal Pain  . Nausea  . 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 She was due for a feeding tube placement by general surgery in the outpatient setting on 09/17/2013, however she was unable to keep any food down for the last week and came to the hospital much earlier, while in the hospital she was seen by general surgery who are going to do the feeding tube placement on 09/17/2013 as per previous schedule, in the meantime patient has been started on TNA via PICC line. She is relatively symptom-free except for chronic left upper quadrant pain and back pain due to her metastatic disease.   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 - Causing chronic left upper quadrant abdominal pain and dysphagia, unable to take proper diet and now has severe protein calorie malnutrition with failure to thrive.  -Started on TPN by pharmacy via her right-sided Port-A-Cath.  -Continue chronic methadone along with short-acting narcotics for breakthrough pain, liquid diet for pleasure and -comfort with aspiration precautions. She follows with  Dr. Darrold Span for her oncology problems. -According to Dr. 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 J-tube placed instead.  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 No acute issues. Lipase stable.  History of Graves' disease. Status post treatment, on thyroid replacement therapy. Will continue Synthroid at 75 mcg IV every 24 hours which is higher than her home dose since TSH is elevated and as oral intake is unreliable.  Lab Results  Component Value Date   TSH 43.025* 09/16/2013  Repeat TSH has been ordered  Mildly prolonged QT on EKG. No dysrhythmia on telemetry. Continue telemetry monitoring, IV magnesium given. We'll monitor electrolytes closely  Chronic pain syndrome. Continue home dose methadone and short-acting narcotics for breakthrough.  Hypokalemia. Replaced IV repeat in the morning.   History of MS. Outpatient followup with neurologist post discharge. No acute issues.     Code Status: Full, long term prognosis is poor.  Family Communication:    Disposition Plan: Home   Procedures -   L arm PICC line, due for G-tube placement by general surgery on 09/17/2013   Consults  CCS    Medications  Scheduled Meds: . [START ON 09/18/2013] enoxaparin (LOVENOX)  injection  40 mg Subcutaneous Q24H  . [facility] HOLD] levothyroxine  75 mcg Intravenous Daily  . [facility] HOLD] methadone  30 mg Oral Q8H  . [facility] HOLD] nicotine  21 mg Transdermal Daily  . [MAR HOLD] pantoprazole (PROTONIX) IV  40 mg Intravenous Q24H  . [facility] HOLD] sodium chloride  3 mL Intravenous Q12H   Continuous Infusions: . 0.9 % NaCl with KCl 20 mEq / L    . [MAR HOLD] .TPN (CLINIMIX-E) Adult 40 mL/hr at 09/16/13 1738   And  . [facility] HOLD] fat emulsion 240 mL (09/16/13 1737)  . Marland KitchenTPN (CLINIMIX-E) Adult     And  . fat emulsion    . lactated ringers 10 mL/hr at 09/17/13 1019   PRN Meds:.[MAR HOLD] acetaminophen, [MAR HOLD] acetaminophen, [MAR HOLD] dextrose,  fentaNYL, [MAR HOLD] glucose-Vitamin C, HYDROcodone-acetaminophen, [MAR HOLD]  HYDROmorphone (DILAUDID) injection, [MAR HOLD] LORazepam, [MAR HOLD] ondansetron (ZOFRAN) IV, ondansetron (ZOFRAN) IV, ondansetron, [MAR HOLD] sodium chloride, [MAR HOLD] sodium chloride, [MAR HOLD] white petrolatum  DVT Prophylaxis    Heparin   Lab Results  Component Value Date   PLT 326 09/15/2013    Antibiotics    Anti-infectives   Start     Dose/Rate Route Frequency Ordered Stop   09/17/13 1055  ceFAZolin (ANCEF) 2-3 GM-% IVPB SOLR    Comments:  Cato, Sarah   : cabinet override      09/17/13 1055 09/17/13 1114   09/16/13 0645  ceFAZolin (ANCEF) IVPB 2 g/50 mL premix  Status:  Discontinued     2 g 100 mL/hr over 30 Minutes Intravenous On call to O.R. 09/16/13 0641 09/16/13 0654   09/16/13 0645  ceFAZolin (ANCEF) IVPB 2 g/50 mL premix  Status:  Discontinued     2 g 100 mL/hr over 30 Minutes Intravenous On call to O.R. 09/16/13 0641 09/17/13 1359            Objective:   Filed Vitals:   09/17/13 1300 09/17/13 1315 09/17/13 1330 09/17/13 1436  BP: 127/85 133/65  136/86  Pulse: 88 83 82 85  Temp:  97 F (36.1 C)  97.4 F (36.3 C)  TempSrc:    Axillary  Resp: 21 15 22 18   Height:      Weight:      SpO2: 100% 100% 99% 100%    Wt Readings from Last 3 Encounters:  09/12/13 49.442 kg (109 lb)  09/12/13 49.442 kg (109 lb)  09/09/13 49.714 kg (109 lb 9.6 oz)     Intake/Output Summary (Last 24 hours) at 09/17/13 1501 Last data filed at 09/17/13 1300  Gross per 24 hour  Intake    620 ml  Output    140 ml  Net    480 ml    Exam Awake Alert, Oriented X 3, No new F.N deficits, Normal affect [place].AT,PERRAL Supple Neck,No JVD, No cervical lymphadenopathy appriciated.  Symmetrical Chest wall movement, Good air movement bilaterally, CTAB RRR,No Gallops,Rubs or new Murmurs, No Parasternal Heave +ve B.Sounds, Abd Soft, Non tender, No organomegaly appriciated, No rebound - guarding or  rigidity. No Cyanosis, Clubbing or edema, No new Rash or bruise      Data Review   Micro Results Recent Results (from the past 240 hour(s))  SURGICAL PCR SCREEN     Status: None   Collection Time    09/17/13  7:17 AM      Result Value Range Status   MRSA, PCR NEGATIVE  NEGATIVE Final   Staphylococcus aureus NEGATIVE  NEGATIVE Final   Comment:            The Xpert SA Assay (FDA     approved for NASAL specimens     in patients over 74 years of age),     is one component of     a comprehensive surveillance     program.  Test performance has     been validated by [employer] for patients greater     than or equal to 54 year old.     It is not intended     to diagnose infection nor to     guide or monitor treatment.    Radiology Reports Dg Chest 2 View  09/12/2013   CLINICAL DATA:  Body pain, cough  EXAM: CHEST  2 VIEW  COMPARISON:  CT chest 07/31/2013  FINDINGS: Clear lungs. Normal heart and mediastinum. Unremarkable osseous structures. Right-sided Port-A-Cath with the tip projecting over the proximal IVC.  IMPRESSION: No active cardiopulmonary disease.   Electronically Signed   By: [name]   On: 09/12/2013 12:02         CBC  Recent Labs Lab 09/12/13 1305 09/13/13 0400 09/14/13 0455 09/15/13 0540  WBC 7.2 5.1 4.8 5.7  HGB 14.0 13.1 13.1 14.4  HCT 40.1 37.2 37.6 40.5  PLT 386 355 349 326  MCV 90.9 90.7 91.5 91.2  MCH 31.7 32.0 31.9 32.4  MCHC 34.9 35.2 34.8 35.6  RDW 14.3 14.2 14.4 14.3  LYMPHSABS 0.7  --  0.8 0.9  MONOABS 0.4  --  0.4 0.5  EOSABS 0.2  --  0.2 0.2  BASOSABS 0.0  --  0.0 0.1    Chemistries   Recent Labs Lab 09/12/13 1305 09/13/13 0400 09/14/13 0455 09/15/13 0540 09/16/13 0535 09/17/13 0910  NA 132* 133* 136 133* 136 135  K 3.7 2.9* 3.7 3.2* 3.1* 3.8  CL 96 95* 98 97 97 97  CO2 24 24 24 26 27 27   GLUCOSE 69* 51* 64* 122* 87 107*  BUN 3* <3* <3* <3* 5* 5*  CREATININE 0.41* 0.40* 0.36* 0.37* 0.45* 0.45*  CALCIUM 8.2* 7.8*  8.1* 8.7 8.8 8.7  MG  --  1.7 2.1 2.1  --  2.0  AST 52* 40* 32 26  --   --   ALT 21 17 13 15   --   --   ALKPHOS 133* 123* 123* 133*  --   --   BILITOT 0.3 0.3 0.3 0.2*  --   --    ------------------------------------------------------------------------------------------------------------------ estimated creatinine clearance is 68.5 ml/min (by C-G formula based on Cr of 0.45). ------------------------------------------------------------------------------------------------------------------ No results found for this basename: HGBA1C,  in the last 72 hours ------------------------------------------------------------------------------------------------------------------  Recent Labs  09/15/13 0540  TRIG 233*   ------------------------------------------------------------------------------------------------------------------  Recent Labs  09/16/13 0535  TSH 43.025*   ------------------------------------------------------------------------------------------------------------------ No results found for this basename: VITAMINB12, FOLATE, FERRITIN, TIBC, IRON, RETICCTPCT,  in the last 72 hours  Coagulation profile No results found for this basename: INR, PROTIME,  in the last 168 hours  No results found for this basename: DDIMER,  in the last 72 hours  Cardiac Enzymes No results found for this basename: CK, CKMB, TROPONINI, MYOGLOBIN,  in the last 168 hours ------------------------------------------------------------------------------------------------------------------ No components found with this basename: POCBNP,      Time Spent in minutes 35   Antania Hoefling A M.D on 09/17/2013 at 3:01 PM  Between 7am to 7pm - Pager - [phone]  After 7pm  go to [email or web address] - password [email or web address]  And look for the night coverage person covering for me after hours  [facility] Office  [phone]

## 2013-09-17 NOTE — Anesthesia Postprocedure Evaluation (Signed)
Anesthesia Post Note  Patient: [name]  Procedure(s) Performed: Procedure(s) (LRB): FEEDING JEJUNOSTOMY TUBE PLACEMENT (N/A)  Anesthesia type: general  Patient location: PACU  Post pain: Pain level controlled  Post assessment: Patient's Cardiovascular Status Stable  Last Vitals:  Filed Vitals:   09/17/13 1330  BP:   Pulse: 82  Temp:   Resp: 22    Post vital signs: Reviewed and stable  Level of consciousness: sedated  Complications: No apparent anesthesia complications

## 2013-09-17 NOTE — Anesthesia Preprocedure Evaluation (Signed)
Anesthesia Evaluation  Patient identified by MRN, date of birth, ID band Patient awake    Reviewed: Allergy & Precautions, H&P , NPO status , Patient's Chart, lab work & pertinent test results, reviewed documented beta blocker date and time   History of Anesthesia Complications (+) PONV and history of anesthetic complications  Airway Mallampati: II TM Distance: >3 FB Neck ROM: full    Dental   Pulmonary pneumonia -, resolved, Current Smoker,  breath sounds clear to auscultation        Cardiovascular negative cardio ROS  Rhythm:regular     Neuro/Psych  Headaches, PSYCHIATRIC DISORDERS  Neuromuscular disease    GI/Hepatic Neg liver ROS, Obstructive Ca of esophagus   Endo/Other  negative endocrine ROSHypothyroidism   Renal/GU negative Renal ROS  negative genitourinary   Musculoskeletal   Abdominal   Peds  Hematology negative hematology ROS (+)   Anesthesia Other Findings See surgeon's H&P   Reproductive/Obstetrics negative OB ROS                           Anesthesia Physical Anesthesia Plan  ASA: III  Anesthesia Plan: General   Post-op Pain Management:    Induction: Intravenous, Rapid sequence and Cricoid pressure planned  Airway Management Planned: Oral ETT  Additional Equipment:   Intra-op Plan:   Post-operative Plan: Extubation in OR  Informed Consent: I have reviewed the patients History and Physical, chart, labs and discussed the procedure including the risks, benefits and alternatives for the proposed anesthesia with the patient or authorized representative who has indicated his/her understanding and acceptance.   Dental Advisory Given  Plan Discussed with: CRNA and Surgeon  Anesthesia Plan Comments:         Anesthesia Quick Evaluation

## 2013-09-17 NOTE — Progress Notes (Addendum)
PARENTERAL NUTRITION CONSULT NOTE - FOLLOW UP  Pharmacy Consult for TNA Indication: dysphagia, severe malnutrition related to esophageal cancer  Allergies  Allergen Reactions  . Erythromycin Nausea And Vomiting  . Morphine And Related Other (See Comments)    whelps on arms at iv site   Patient Measurements: Height: 5\' 3"  (160 cm) Weight: 109 lb (49.442 kg) IBW/kg (Calculated) : 52.4  Vital Signs: Temp: 97 F (36.1 C) (11/12 1315) Temp src: Oral (11/12 1022) BP: 133/65 mmHg (11/12 1315) Pulse Rate: 82 (11/12 1330) Intake/Output from previous day: 11/11 0701 - 11/12 0700 In: 470 [P.O.:460; I.V.:10] Out: -  Intake/Output from this shift: Total I/O In: 500 [I.V.:500] Out: 140 [Urine:140]  Labs:  Recent Labs  09/15/13 0540  WBC 5.7  HGB 14.4  HCT 40.5  PLT 326     Recent Labs  09/15/13 0540 09/16/13 0535 09/17/13 0910  NA 133* 136 135  K 3.2* 3.1* 3.8  CL 97 97 97  CO2 26 27 27   GLUCOSE 122* 87 107*  BUN <3* 5* 5*  CREATININE 0.37* 0.45* 0.45*  CALCIUM 8.7 8.8 8.7  MG 2.1  --  2.0  PHOS 3.2  --  4.1  PROT 6.2  --   --   ALBUMIN 2.5*  --   --   AST 26  --   --   ALT 15  --   --   ALKPHOS 133*  --   --   BILITOT 0.2*  --   --   PREALBUMIN 12.8*  --   --   TRIG 233*  --   --    Estimated Creatinine Clearance: 68.5 ml/min (by C-G formula based on Cr of 0.45).    Recent Labs  09/16/13 0441 09/16/13 0737 09/17/13 1029  GLUCAP 96 109* 102*    Insulin Requirements in the past 24 hours:   SSI dc'd 11/11; fingerstick cbg 102, serum 107  Current Nutrition:  Clinimix E 5/15 at 53mL/hr + fat emulsion 20% at 24mL/hr- provides 48 grams of protein and 1162 kcal daily Clear liquid diet for enjoyment/comfort- poor appetite documented.  Assessment: 4 YOF with h/o primary distal esophagus/GE junction with met involvement who is severely malnourished and has been unable to eat for the past 2 days. She is scheduled for PEG placement 11/12.  Unable to place  PEG due to tumor.  Feeding jejunostomy tube placed instead.   Nutritional Goals:  1600-1800 kCal, 80-100 grams of protein per day per RD assessment 11/8 GI: hx recurrent pancreatitis and sphincter of Oddi dysfunction, lipase stable currently; IV PPI 11/12 OR: "We were unable to place a gastrostomy tube because the retrogastric retroperitoneum was filled with tumor, and the stomach was adherent and fixed to it, shrunken in size, and would not lift up to the abdominal wall. For that reason I placed a feeding jejunostomy tube approximately 10 inches distal to the ligament of Treitz" per Dr. Derrell Lolling Endo: hx Graves' disease- home dose Synthroid PO- here is being started on Synthroid IV d/t elevated TSH Lytes: Na 135 , K 3.8 after 6 runs yesterday, CorCa 9.9 Renal: SCr 0.45, CrCl 65-46mL/min Hepatobil: albumin low at 2.5, alkphos slightly elevated at 133, AST, ALT WNL, yesterday. Prealbumin 11.7 which indicated malnutrition Neuro: methadone 30mg  PO q8h ID/Onc: has not yet started FOLFOX chemo treatment; afebrile. No abx. Best Practices: subq hep TPN Access: Port-A-Cath TPN day#: 4  (ordered 11/8 but did not start until 11/9 due to miscommunication between RN, MD,  and RPh)  Plan:  1. Increase Clinimix E 5/15 to goal rate of 70 ml/hr + lipids at 10 ml/hr. Addendum: after discussion with unit RD will only increase rate from 40 ml/hr to 50 ml/hr as she plans to initiate tube feedings at 20 ml/hr 2. Goal rate provides 84 gm protein and 1673 kcals daily. 3. F/u for plans to use newly placed jejunostomy tube for tube feeds.  See RD note for recs 4. TNA Thursday labs ordered. Herby Abraham, Pharm.D. [phone] 09/17/2013 2:21 PM

## 2013-09-17 NOTE — Transfer of Care (Signed)
Immediate Anesthesia Transfer of Care Note  Patient: [name]  Procedure(s) Performed: Procedure(s): FEEDING JEJUNOSTOMY TUBE PLACEMENT (N/A)  Patient Location: PACU  Anesthesia Type:General  Level of Consciousness: sedated  Airway & Oxygen Therapy: Patient Spontanous Breathing and Patient connected to nasal cannula oxygen  Post-op Assessment: Report given to PACU RN, Post -op Vital signs reviewed and stable and Patient moving all extremities X 4  Post vital signs: Reviewed and stable  Complications: No apparent anesthesia complications

## 2013-09-17 NOTE — Anesthesia Procedure Notes (Signed)
Procedure Name: Intubation Date/Time: 09/17/2013 11:13 AM Performed by: Orvilla Fus A Pre-anesthesia Checklist: Patient identified, Timeout performed, Emergency Drugs available, Suction available and Patient being monitored Patient Re-evaluated:Patient Re-evaluated prior to inductionOxygen Delivery Method: Circle system utilized Preoxygenation: Pre-oxygenation with 100% oxygen Intubation Type: IV induction and Rapid sequence Laryngoscope Size: Mac and 3 Grade View: Grade I Tube type: Oral Tube size: 7.0 mm Number of attempts: 1 Airway Equipment and Method: Stylet Placement Confirmation: ETT inserted through vocal cords under direct vision,  breath sounds checked- equal and bilateral and positive ETCO2 Secured at: 21 cm Tube secured with: Tape Dental Injury: Teeth and Oropharynx as per pre-operative assessment

## 2013-09-17 NOTE — Progress Notes (Signed)
NUTRITION FOLLOW-UP  DOCUMENTATION CODES Per approved criteria  -Severe malnutrition in the context of chronic illness   INTERVENTION: Initiate Osmolite 1.2 via j-tube at 20 ml/hr. This will provide: 576 kcal, 27 grams protein, 394 ml free water. Pt will require slow advancement - RD to assess labs tomorrow morning and write advancement recommendations at that time. TPN wean per PharmD. RD to continue to follow nutrition care plan.  NUTRITION DIAGNOSIS: Malnutrition related to cancer as evidenced by 16% weight loss x 1 month, intake of </= 75% of needs for >/= 1 month, and severe fat and muscle wasting. Ongoing.  Goal: Pt to meet >/= 90% of their estimated nutrition needs - unmet.  Monitor:  TF initiation and tolerance, weight trend, labs, transition off TPN  ASSESSMENT: PMHx of metastatic adenocarcinoma of distal esophagus. Pt was planned to have PEG placed on 11/12 however pt was admitted to hospital 2/2 inability to swallow given her esophageal obstruction.  Patient is currently receiving TPN with Clinimix E 5/15 @ 40 ml/hr and lipids @ 10 ml/hr. Provides 1162 kcal and 48 grams protein per day. Meets 73% minimum estimated energy needs and 60% minimum estimated protein needs.   Pt underwent j-tube placement this morning (was unable to get g-tube.) RD received consult to initiate feedings. Discussed with TPN PharmD. Per PharmD will advance pt to Clinimix E 5/15 at 50 ml/hr tonight.  TPN labs reviewed: Potassium, Sodium, Magnesium and Phosphorus are currently WNL Blood sugars ranging from 96 - 109 Prealbumin is low at 12.8 Triglycerides elevated at 233  Pt also ordered for Clear Liquids at this time for comfort. Intake is minimal.  Height: Ht Readings from Last 1 Encounters:  09/12/13 5\' 3"  (1.6 m)    Weight: Wt Readings from Last 1 Encounters:  09/12/13 109 lb (49.442 kg)  Admit wt 108 lb - stable  BMI:  Body mass index is 19.31 kg/(m^2). Weight is WNL.  Estimated  Nutritional Needs: Kcal: 1600-1800 Protein: 80-100 Fluid: > 1.8 L/day  Skin: abdomen incision  Diet Order: Clear Liquid  EDUCATION NEEDS: -No education needs identified at this time   Intake/Output Summary (Last 24 hours) at 09/17/13 1457 Last data filed at 09/17/13 1300  Gross per 24 hour  Intake    620 ml  Output    140 ml  Net    480 ml    Last BM: 11/10  Labs:   Recent Labs Lab 09/14/13 0455 09/15/13 0540 09/16/13 0535 09/17/13 0910  NA 136 133* 136 135  K 3.7 3.2* 3.1* 3.8  CL 98 97 97 97  CO2 24 26 27 27   BUN <3* <3* 5* 5*  CREATININE 0.36* 0.37* 0.45* 0.45*  CALCIUM 8.1* 8.7 8.8 8.7  MG 2.1 2.1  --  2.0  PHOS 2.1* 3.2  --  4.1  GLUCOSE 64* 122* 87 107*   CBG (last 3)   Recent Labs  09/16/13 0441 09/16/13 0737 09/17/13 1029  GLUCAP 96 109* 102*   Prealbumin  Date/Time Value Range Status  09/15/2013  5:40 AM 12.8* 17.0 - 34.0 mg/dL Final     Performed at [facility]   Triglycerides  Date/Time Value Range Status  09/15/2013  5:40 AM 233* <150 mg/dL Final     Scheduled Meds: . [START ON 09/18/2013] enoxaparin (LOVENOX) injection  40 mg Subcutaneous Q24H  . [facility] HOLD] levothyroxine  75 mcg Intravenous Daily  . [facility] HOLD] methadone  30 mg Oral Q8H  . [facility] HOLD] nicotine  21  mg Transdermal Daily  . [MAR HOLD] pantoprazole (PROTONIX) IV  40 mg Intravenous Q24H  . [facility] HOLD] sodium chloride  3 mL Intravenous Q12H    Continuous Infusions: . 0.9 % NaCl with KCl 20 mEq / L    . [MAR HOLD] .TPN (CLINIMIX-E) Adult 40 mL/hr at 09/16/13 1738   And  . [facility] HOLD] fat emulsion 240 mL (09/16/13 1737)  . Marland KitchenTPN (CLINIMIX-E) Adult     And  . fat emulsion    . lactated ringers 10 mL/hr at 09/17/13 [address] [place], [place], [place] Pager: [phone] After-hours pager: [phone]

## 2013-09-18 ENCOUNTER — Ambulatory Visit: Payer: Medicare Other

## 2013-09-18 ENCOUNTER — Inpatient Hospital Stay (HOSPITAL_COMMUNITY): Payer: Medicare Other

## 2013-09-18 DIAGNOSIS — F1120 Opioid dependence, uncomplicated: Secondary | ICD-10-CM

## 2013-09-18 LAB — COMPREHENSIVE METABOLIC PANEL
ALT: 10 U/L (ref 0–35)
Alkaline Phosphatase: 102 U/L (ref 39–117)
BUN: 10 mg/dL (ref 6–23)
CO2: 24 mEq/L (ref 19–32)
Calcium: 8.6 mg/dL (ref 8.4–10.5)
Chloride: 92 mEq/L — ABNORMAL LOW (ref 96–112)
Creatinine, Ser: 0.39 mg/dL — ABNORMAL LOW (ref 0.50–1.10)
GFR calc Af Amer: >90 mL/min (ref >90)
GFR calc non Af Amer: >90 mL/min (ref >90)
Glucose, Bld: 133 mg/dL — ABNORMAL HIGH (ref 70–99)
Potassium: 4 mEq/L (ref 3.5–5.1)
Sodium: 129 mEq/L — ABNORMAL LOW (ref 135–145)
Total Protein: 6.2 g/dL (ref 6.0–8.3)

## 2013-09-18 LAB — GLUCOSE, CAPILLARY
Glucose-Capillary: 123 mg/dL — ABNORMAL HIGH (ref 70–99)
Glucose-Capillary: 137 mg/dL — ABNORMAL HIGH (ref 70–99)
Glucose-Capillary: 137 mg/dL — ABNORMAL HIGH (ref 70–99)
Glucose-Capillary: 141 mg/dL — ABNORMAL HIGH (ref 70–99)
Glucose-Capillary: 141 mg/dL — ABNORMAL HIGH (ref 70–99)
Glucose-Capillary: 162 mg/dL — ABNORMAL HIGH (ref 70–99)

## 2013-09-18 LAB — MAGNESIUM: Magnesium: 1.9 mg/dL (ref 1.5–2.5)

## 2013-09-18 MED ORDER — SODIUM CHLORIDE 0.9 % IV SOLN
INTRAVENOUS | Status: DC
  Administered 2013-09-18: 11:00:00 via INTRAVENOUS

## 2013-09-18 MED ORDER — TRACE MINERALS CR-CU-F-FE-I-MN-MO-SE-ZN IV SOLN
INTRAVENOUS | Status: AC
  Administered 2013-09-18: 18:00:00 via INTRAVENOUS
  Filled 2013-09-18: qty 1000

## 2013-09-18 MED ORDER — OSMOLITE 1.2 CAL PO LIQD
1000.0000 mL | ORAL | Status: DC
  Administered 2013-09-18 – 2013-09-20 (×2): 1000 mL
  Filled 2013-09-18 (×6): qty 1000

## 2013-09-18 NOTE — Progress Notes (Signed)
NUTRITION FOLLOW-UP  DOCUMENTATION CODES Per approved criteria  -Severe malnutrition in the context of chronic illness   INTERVENTION: Advance Osmolite 1.2 via j-tube by 10 ml q 12 hours to goal of 60 ml/hr. This goal rate will provide: 1728 kcal, 80 grams protein, 1181 ml free water. Pt will require slow advancement 2/2 refeeding risk. Recommend TPN wean with TF advancement. Once IVF discontinued, recommend free water flushes of 160 ml QID. This will provide an additional 640 ml free water daily. Monitor magnesium, potassium, and phosphorus daily throughout advancement of nutrition support, MD or PharmD to replete as needed, as pt is at risk for refeeding syndrome given severe malnutrition. Discussed with Dr. Arthor Captain and RN. Recommend continued follow-up with [facility] RD for further management of feedings. RD to continue to follow nutrition care plan.  NUTRITION DIAGNOSIS: Malnutrition related to cancer as evidenced by 16% weight loss x 1 month, intake of </= 75% of needs for >/= 1 month, and severe fat and muscle wasting. Ongoing.  Goal: Pt to meet >/= 90% of their estimated nutrition needs - met.  Monitor:  TF initiation and tolerance, weight trend, labs, transition off TPN  ASSESSMENT: PMHx of metastatic adenocarcinoma of distal esophagus. Pt was planned to have PEG placed on 11/12 however pt was admitted to hospital 2/2 inability to swallow given her esophageal obstruction.  Patient is currently receiving TPN with Clinimix E 5/15 @ 50 ml/hr and lipids @ 10 ml/hr. Provides 1332 kcal and 60 grams protein per day.   Pt underwent j-tube placement 11/13 (was unable to get G-tube.) Currently receiving and tolerating Osmolite 1.2 at 20 ml/hr. This provides 576 kcal, 27 grams protein, 394 ml free water.  TF + TPN provides: 1908 kcal, 87 grams protein and meets 100% kcal and protein needs.  TPN labs reviewed: Potassium, Sodium, Magnesium and Phosphorus are currently WNL Blood sugars  ranging from 96 - 109 Prealbumin is low at 12.8 Triglycerides elevated at 233  Pt also ordered for Clear Liquids at this time for comfort. Intake is minimal.  Height: Ht Readings from Last 1 Encounters:  09/12/13 5\' 3"  (1.6 m)    Weight: Wt Readings from Last 1 Encounters:  09/18/13 111 lb 14.4 oz (50.758 kg)  Admit wt 108 lb - stable  BMI:  Body mass index is 19.83 kg/(m^2). Weight is WNL.  Estimated Nutritional Needs: Kcal: 1600-1800 Protein: 80-100 Fluid: > 1.8 L/day  Skin: abdomen incision  Diet Order: Clear Liquid  EDUCATION NEEDS: -No education needs identified at this time   Intake/Output Summary (Last 24 hours) at 09/18/13 0935 Last data filed at 09/18/13 0911  Gross per 24 hour  Intake    780 ml  Output    440 ml  Net    340 ml    Last BM: 11/11  Labs:   Recent Labs Lab 09/15/13 0540 09/16/13 0535 09/17/13 0910 09/18/13 0600  NA 133* 136 135 129*  K 3.2* 3.1* 3.8 4.0  CL 97 97 97 92*  CO2 26 27 27 24   BUN <3* 5* 5* 10  CREATININE 0.37* 0.45* 0.45* 0.39*  CALCIUM 8.7 8.8 8.7 8.6  MG 2.1  --  2.0 1.9  PHOS 3.2  --  4.1 3.5  GLUCOSE 122* 87 107* 133*   CBG (last 3)   Recent Labs  09/18/13 0025 09/18/13 0406 09/18/13 0811  GLUCAP 141* 141* 137*   Prealbumin  Date/Time Value Range Status  09/15/2013  5:40 AM 12.8* 17.0 - 34.0 mg/dL  Final     Performed at [facility]   Triglycerides  Date/Time Value Range Status  09/15/2013  5:40 AM 233* <150 mg/dL Final     Scheduled Meds: . enoxaparin (LOVENOX) injection  40 mg Subcutaneous Q24H  . levothyroxine  75 mcg Intravenous Daily  . methadone  30 mg Oral Q8H  . nicotine  21 mg Transdermal Daily  . pantoprazole (PROTONIX) IV  40 mg Intravenous Q24H  . sodium chloride  3 mL Intravenous Q12H    Continuous Infusions: . 0.9 % NaCl with KCl 20 mEq / L    . Marland KitchenTPN (CLINIMIX-E) Adult 50 mL/hr at 09/17/13 1734   And  . fat emulsion 240 mL (09/17/13 1734)  . feeding supplement  (OSMOLITE 1.2 CAL) 1,000 mL (09/17/13 2100)  . lactated ringers 10 mL/hr at 09/17/13 [address] [place], [place], [place] Pager: [phone] After-hours pager: [phone]

## 2013-09-18 NOTE — Progress Notes (Signed)
1 Day Post-Op  Subjective: Stable and alert. Abdomen sore. Burping a little. Denies nausea. Passing flatus. No stool. Seems to be tolerating tube feeds at 20 cc per hour.  Objective: Vital signs in last 24 hours: Temp:  [97 F (36.1 C)-98.7 F (37.1 C)] 98.7 F (37.1 C) (11/12 2019) Pulse Rate:  [77-101] 101 (11/12 2019) Resp:  [15-22] 18 (11/12 2019) BP: (117-152)/(65-86) 152/83 mmHg (11/12 2019) SpO2:  [97 %-100 %] 100 % (11/12 2019) Last BM Date: 09/16/13  Intake/Output from previous day: 11/12 0701 - 11/13 0700 In: 720 [I.V.:500] Out: 440 [Urine:440] Intake/Output this shift: Total I/O In: 220 [Other:220] Out: -     EXAM: General appearance: alert. Mental status normal. Deconditioned. Cachectic. Mother in room. GI: abdomen is soft. Hypoactive bowel sounds. Nondistended. Tube site looks fine. Jejunostomy tube functioning normally.  Lab Results:  Results for orders placed during the hospital encounter of 09/12/13 (from the past 24 hour(s))  SURGICAL PCR SCREEN     Status: None   Collection Time    09/17/13  7:17 AM      Result Value Range   MRSA, PCR NEGATIVE  NEGATIVE   Staphylococcus aureus NEGATIVE  NEGATIVE  BASIC METABOLIC PANEL     Status: Abnormal   Collection Time    09/17/13  9:10 AM      Result Value Range   Sodium 135  135 - 145 mEq/L   Potassium 3.8  3.5 - 5.1 mEq/L   Chloride 97  96 - 112 mEq/L   CO2 27  19 - 32 mEq/L   Glucose, Bld 107 (*) 70 - 99 mg/dL   BUN 5 (*) 6 - 23 mg/dL   Creatinine, Ser 1.19 (*) 0.50 - 1.10 mg/dL   Calcium 8.7  8.4 - 14.7 mg/dL   GFR calc non Af Amer >90  >90 mL/min   GFR calc Af Amer >90  >90 mL/min  MAGNESIUM     Status: None   Collection Time    09/17/13  9:10 AM      Result Value Range   Magnesium 2.0  1.5 - 2.5 mg/dL  PHOSPHORUS     Status: None   Collection Time    09/17/13  9:10 AM      Result Value Range   Phosphorus 4.1  2.3 - 4.6 mg/dL  CORTISOL     Status: None   Collection Time    09/17/13  9:10 AM       Result Value Range   Cortisol, Plasma 14.7    GLUCOSE, CAPILLARY     Status: Abnormal   Collection Time    09/17/13 10:29 AM      Result Value Range   Glucose-Capillary 102 (*) 70 - 99 mg/dL  GLUCOSE, CAPILLARY     Status: Abnormal   Collection Time    09/18/13 12:25 AM      Result Value Range   Glucose-Capillary 141 (*) 70 - 99 mg/dL   Comment 1 Notify RN     Comment 2 Documented in Chart    GLUCOSE, CAPILLARY     Status: Abnormal   Collection Time    09/18/13  4:06 AM      Result Value Range   Glucose-Capillary 141 (*) 70 - 99 mg/dL   Comment 1 Notify RN     Comment 2 Documented in Chart       Studies/Results: @RISRSLT24 @  . enoxaparin (LOVENOX) injection  40 mg Subcutaneous Q24H  . levothyroxine  75  mcg Intravenous Daily  . methadone  30 mg Oral Q8H  . nicotine  21 mg Transdermal Daily  . pantoprazole (PROTONIX) IV  40 mg Intravenous Q24H  . sodium chloride  3 mL Intravenous Q12H     Assessment/Plan: s/p Procedure(s): FEEDING JEJUNOSTOMY TUBE PLACEMENT  POD #1. Feeding jejunostomy tube placement. Stable. Slowly advance tube feeds per nutritional service protocol Wean off of TNA. Goal is outpatient management.  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  Operative findings significant for progressive upper abdominal retroperitoneal tumor burden with fixation of stomach to retroperitoneum.   Severe protein calorie malnutrition.   Agree that we should consider palliative care consult at some point. Defer timing of this to Dr. Darrold Span, her primary oncologist.   Chronic pancreatitis  on methadone maintenance for drug history, Dr. Betti Cruz.  History laparoscopic cholecystectomy  History epigastric hernia repair  Recent Port-A-Cath placement  Status post treatment of Graves' disease, on replacement therapy  Multiple sclerosis, minimally symptomatic  Long QT syndrome  Long history tobacco use    @PROBHOSP @  LOS: 6 days    Alicia Mccasland M.  Derrell Lolling, EllisonD., [facility] General and Minimally invasive Surgery Breast and Colorectal Surgery Office:   [phone] Pager:   [phone]  09/18/2013  . .prob

## 2013-09-18 NOTE — Progress Notes (Signed)
[facility]                                                                                Patient Demographics  [name], is a 47 y.o. female, DOB - 03/12/1966, XBJ:[id number]  Admit date - 09/12/2013   Admitting Physician [name], MD  Outpatient Primary MD for the patient is [name], MD  LOS - 6  Subjective:   Complains about minimal abdominal pain, denies any shortness of breath or chest pain. Patient is sleepy, reported that she got pain medication this morning.  Chief Complaint  Patient presents with  . Abdominal Pain  . Nausea  . 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 She was due for a feeding tube placement by general surgery in the outpatient setting on 09/17/2013, however she was unable to keep any food down for the last week and came to the hospital much earlier, while in the hospital she was seen by general surgery who are going to do the feeding tube placement on 09/17/2013 as per previous schedule, in the meantime patient has been started on TNA via PICC line. She is relatively symptom-free except for chronic left upper quadrant pain and back pain due to her metastatic disease.   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 - Causing chronic left upper quadrant abdominal pain and dysphagia, unable to take proper diet and now has severe protein calorie malnutrition with failure to thrive.  -Started on TPN by pharmacy via her right-sided Port-A-Cath.  -Continue chronic methadone along with short-acting narcotics for breakthrough pain, liquid diet for  pleasure and -comfort with aspiration precautions. She follows with Dr. Darrold Span for her oncology problems. -According to Dr. 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 J-tube placed instead.  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 No acute issues. Lipase stable.  History of Graves' disease Status post induced hypothyroidism. Will continue Synthroid at 75 mcg IV every 24 hours which is higher than her home dose since TSH is elevated and as oral intake is unreliable.  Lab Results  Component Value Date   TSH 43.025* 09/16/2013  Repeat TSH has been ordered  Mildly prolonged QT on EKG. No dysrhythmia on telemetry. Continue telemetry monitoring, IV magnesium given. We'll monitor electrolytes closely  Chronic pain syndrome. Continue home dose methadone and short-acting narcotics for breakthrough.  Hypokalemia. Replaced IV repeat in the morning.   History of MS. Outpatient followup with neurologist post discharge. No acute issues.  Severe malnutrition -In settings of advanced metastatic adenocarcinoma of the esophagus. -Patient currently on TPN, G-tube placed yesterday and tube feeding started.  Hyponatremia -With hypochloremia, likely patient is not getting enough sodium, we'll increase NS rate.   Code Status: Full, long term prognosis is poor.  Family Communication:  Disposition Plan: Home   Procedures -   L arm PICC line, J-tube placed by general surgery on 09/17/2013   Consults  CCS, Hem/Onc   Medications  Scheduled Meds: . enoxaparin (LOVENOX) injection  40 mg Subcutaneous Q24H  . levothyroxine  75 mcg Intravenous Daily  . methadone  30 mg Oral Q8H  . nicotine  21 mg Transdermal Daily  . pantoprazole (PROTONIX) IV  40 mg Intravenous Q24H  . sodium chloride  3 mL Intravenous Q12H   Continuous Infusions: . 0.9 % NaCl with KCl 20 mEq / L    . Marland KitchenTPN (CLINIMIX-E) Adult 50 mL/hr at 09/17/13 1734   And  . fat  emulsion 240 mL (09/17/13 1734)  . feeding supplement (OSMOLITE 1.2 CAL)    . lactated ringers 10 mL/hr at 09/17/13 1019   PRN Meds:.acetaminophen, acetaminophen, dextrose, fentaNYL, glucose-Vitamin C, HYDROcodone-acetaminophen, HYDROmorphone (DILAUDID) injection, LORazepam, ondansetron (ZOFRAN) IV, ondansetron (ZOFRAN) IV, ondansetron, sodium chloride, sodium chloride, white petrolatum  DVT Prophylaxis    Heparin   Lab Results  Component Value Date   PLT 326 09/15/2013    Antibiotics    Anti-infectives   Start     Dose/Rate Route Frequency Ordered Stop   09/17/13 1055  ceFAZolin (ANCEF) 2-3 GM-% IVPB SOLR    Comments:  Cato, Sarah   : cabinet override      09/17/13 1055 09/17/13 1114   09/16/13 0645  ceFAZolin (ANCEF) IVPB 2 g/50 mL premix  Status:  Discontinued     2 g 100 mL/hr over 30 Minutes Intravenous On call to O.R. 09/16/13 0641 09/16/13 0654   09/16/13 0645  ceFAZolin (ANCEF) IVPB 2 g/50 mL premix  Status:  Discontinued     2 g 100 mL/hr over 30 Minutes Intravenous On call to O.R. 09/16/13 0641 09/17/13 1359            Objective:   Filed Vitals:   09/17/13 1330 09/17/13 1436 09/17/13 2019 09/18/13 0552  BP:  136/86 152/83 146/89  Pulse: 82 85 101 94  Temp:  97.4 F (36.3 C) 98.7 F (37.1 C) 98.9 F (37.2 C)  TempSrc:  Axillary Oral Oral  Resp: 22 18 18 16   Height:      Weight:    50.758 kg (111 lb 14.4 oz)  SpO2: 99% 100% 100% 100%    Wt Readings from Last 3 Encounters:  09/18/13 50.758 kg (111 lb 14.4 oz)  09/18/13 50.758 kg (111 lb 14.4 oz)  09/09/13 49.714 kg (109 lb 9.6 oz)     Intake/Output Summary (Last 24 hours) at 09/18/13 1025 Last data filed at 09/18/13 0911  Gross per 24 hour  Intake    780 ml  Output    440 ml  Net    340 ml    Exam Awake Alert, Oriented X 3, No new F.N deficits, Normal affect [place].AT,PERRAL Supple Neck,No JVD, No cervical lymphadenopathy appriciated.  Symmetrical Chest wall movement, Good air movement  bilaterally, CTAB RRR,No Gallops,Rubs or new Murmurs, No Parasternal Heave +ve B.Sounds, Abd Soft, Non tender, No organomegaly appriciated, No rebound - guarding or rigidity. No Cyanosis, Clubbing or edema, No new Rash or bruise      Data Review   Micro Results Recent Results (from the past 240 hour(s))  SURGICAL PCR SCREEN     Status: None   Collection Time    09/17/13  7:17 AM      Result Value Range Status   MRSA, PCR NEGATIVE  NEGATIVE Final  Staphylococcus aureus NEGATIVE  NEGATIVE Final   Comment:            The Xpert SA Assay (FDA     approved for NASAL specimens     in patients over 25 years of age),     is one component of     a comprehensive surveillance     program.  Test performance has     been validated by [employer] for patients greater     than or equal to 75 year old.     It is not intended     to diagnose infection nor to     guide or monitor treatment.    Radiology Reports Dg Chest 2 View  09/12/2013   CLINICAL DATA:  Body pain, cough  EXAM: CHEST  2 VIEW  COMPARISON:  CT chest 07/31/2013  FINDINGS: Clear lungs. Normal heart and mediastinum. Unremarkable osseous structures. Right-sided Port-A-Cath with the tip projecting over the proximal IVC.  IMPRESSION: No active cardiopulmonary disease.   Electronically Signed   By: [name]   On: 09/12/2013 12:02         CBC  Recent Labs Lab 09/12/13 1305 09/13/13 0400 09/14/13 0455 09/15/13 0540  WBC 7.2 5.1 4.8 5.7  HGB 14.0 13.1 13.1 14.4  HCT 40.1 37.2 37.6 40.5  PLT 386 355 349 326  MCV 90.9 90.7 91.5 91.2  MCH 31.7 32.0 31.9 32.4  MCHC 34.9 35.2 34.8 35.6  RDW 14.3 14.2 14.4 14.3  LYMPHSABS 0.7  --  0.8 0.9  MONOABS 0.4  --  0.4 0.5  EOSABS 0.2  --  0.2 0.2  BASOSABS 0.0  --  0.0 0.1    Chemistries   Recent Labs Lab 09/12/13 1305 09/13/13 0400 09/14/13 0455 09/15/13 0540 09/16/13 0535 09/17/13 0910 09/18/13 0600  NA 132* 133* 136 133* 136 135 129*  K 3.7 2.9* 3.7 3.2*  3.1* 3.8 4.0  CL 96 95* 98 97 97 97 92*  CO2 24 24 24 26 27 27 24   GLUCOSE 69* 51* 64* 122* 87 107* 133*  BUN 3* <3* <3* <3* 5* 5* 10  CREATININE 0.41* 0.40* 0.36* 0.37* 0.45* 0.45* 0.39*  CALCIUM 8.2* 7.8* 8.1* 8.7 8.8 8.7 8.6  MG  --  1.7 2.1 2.1  --  2.0 1.9  AST 52* 40* 32 26  --   --  18  ALT 21 17 13 15   --   --  10  ALKPHOS 133* 123* 123* 133*  --   --  102  BILITOT 0.3 0.3 0.3 0.2*  --   --  0.3   ------------------------------------------------------------------------------------------------------------------ estimated creatinine clearance is 70.5 ml/min (by C-G formula based on Cr of 0.39). ------------------------------------------------------------------------------------------------------------------ No results found for this basename: HGBA1C,  in the last 72 hours ------------------------------------------------------------------------------------------------------------------ No results found for this basename: CHOL, HDL, LDLCALC, TRIG, CHOLHDL, LDLDIRECT,  in the last 72 hours ------------------------------------------------------------------------------------------------------------------  Recent Labs  09/16/13 0535  TSH 43.025*   ------------------------------------------------------------------------------------------------------------------ No results found for this basename: VITAMINB12, FOLATE, FERRITIN, TIBC, IRON, RETICCTPCT,  in the last 72 hours  Coagulation profile No results found for this basename: INR, PROTIME,  in the last 168 hours  No results found for this basename: DDIMER,  in the last 72 hours  Cardiac Enzymes No results found for this basename: CK, CKMB, TROPONINI, MYOGLOBIN,  in the last 168 hours ------------------------------------------------------------------------------------------------------------------ No components found with this basename: POCBNP,      Time  Spent in minutes 35   Deone Leifheit A M.D on 09/18/2013 at 10:25  AM  Between 7am to 7pm - Pager - [phone]  After 7pm go to [email or web address] - password [email or web address]  And look for the night coverage person covering for me after hours  [facility] Office  [phone]

## 2013-09-18 NOTE — Progress Notes (Signed)
09/18/2013, 9:18 AM  Hospital day 7 Antibiotics: none Chemotherapy: none Post op day 1 jejunostomy  Patient seen, no visitors here presently. Rouses easily to voice.  Subjective: Still pain around new jejunostomy, some better since prn IV dilaudid an hour ago. Nausea now and RN will give zofran. Has tolerated some sips of clear liquids. Passing flatus.   She is aware that disease was so extensive in area of stomach that G tube was not possible.  Objective: Vital signs in last 24 hours: Blood pressure 146/89, pulse 94, temperature 98.9 F (37.2 C), temperature source Oral, resp. rate 16, height 5\' 3"  (1.6 m), weight 111 lb 14.4 oz (50.758 kg), last menstrual period 04/28/2011, SpO2 100.00%.   Intake/Output from previous day: 11/12 0701 - 11/13 0700 In: 740 [I.V.:500] Out: 440 [Urine:440] Intake/Output this shift: Total I/O In: 40 [Other:40] Out: -   Physical exam: Looks uncomfortable, shifts position in bed without assistance, respirations not labored supine. Pupils round and reactive, sclerae not icteric. Mouth dry without lesions. Heart RRR, tachy. PAC infusing TNA at 50 with fat emulsion at 10cc/hr; NS at [facility] 10-20cc/hr and tube feeds at 20 cc/hr. PAC site ok and PICC LUE site ok.Lungs clear anteriorly. Abdomen soft, not distended, quiet, jejunostomy dressing with some serosanguinous drainage. LE no edema, cords, tenderness. Moves all extremities.   Lab Results: No results found for this basename: WBC, HGB, HCT, PLT,  in the last 72 hours BMET  Recent Labs  09/17/13 0910 09/18/13 0600  NA 135 129*  K 3.8 4.0  CL 97 92*  CO2 27 24  GLUCOSE 107* 133*  BUN 5* 10  CREATININE 0.45* 0.39*  CALCIUM 8.7 8.6  Magnesium  1.9 Phos 3.5 Alb 2.2 AP 102  AM cortisol on 09-17-13  Ok at 14.7  UA negative 09-16-13, specific gravity 1.007  Studies/Results: US Renal  09/16/2013   CLINICAL DATA:  History of left flank pain.  EXAM: RENAL/URINARY TRACT ULTRASOUND COMPLETE   COMPARISON:  09/11/2012 ultrasound. CT 07/31/2013.  FINDINGS: Right Kidney  Length: Right renal length is 10.5 cm. Echogenicity within normal limits. No mass, calculus, parenchymal loss, or hydronephrosis visualized.  Left Kidney  Length: Left renal length is 11.3 cm. Echogenicity within normal limits. No mass, calculus, parenchymal loss, or hydronephrosis visualized.  Bladder  Appears normal for degree of bladder distention.  IMPRESSION: Normal ultrasonic appearance of the kidneys. No evidence of hydronephrosis. No bladder lesion identified.   Electronically Signed   By: Onalee Hua  Call M.D.   On: 09/16/2013 14:26    From operative note 09-17-13: Operative Findings: We were unable to place a gastrostomy tube because the retrogastric retroperitoneum was filled with tumor, and the stomach was adherent and fixed to it, shrunken in size, and would not lift up to the abdominal wall. For that reason I placed a feeding jejunostomy tube approximately 10 inches distal to the ligament of Treitz.  Assessment/Plan: 1. 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 G tube not possible due to extent of metastatic tumor, with jejunostomy done instead on 09-17-13. Plan is for FOLFOX outpatient in palliative attempt if she is well enough to try that after jejunostomy. Hospice will not assist while chemotherapy is used; if she is not able to have chemo, or prefers not to have chemo, she is otherwise appropriate for Hospice.   2. Hyponatremia/ hypochloremia: may want to consider increase in IVF rate today.  Hypokalemia: preceded refeeding/TNA. Daily labs for now with present care, K+ ok this  am  3.chronic pancreatitis since cholecystectomy 1993, better in past 2 years post sphincterotomy and stents. Followed by Dr [name].  4.Severe malnutrition in context of acute illness: now on short term TNA and tube feeds begun via jejunostomy Pharmacy and hospital dietician following closely due to high risk  refeeding syndrome. [facility] dietician available to assist with tube feeds after DC. TNA to be discontinued prior to DC from hospital when tube feeding appropriate.  5.PAC in, being used for TNA now, and PICC for other in hospital needs. Will need only PAC after TNA DCd. 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 Managed outpatient by Dr Betti Cruz, who is aware of esophageal cancer 12. 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 Bilateral adrenal hyperplasia by last scans: AM cortisol ok  Thank you. Please call between my rounds if I can help.  Dekisha Mesmer P Pager [phone] Office [phone]

## 2013-09-18 NOTE — Progress Notes (Addendum)
PARENTERAL NUTRITION CONSULT NOTE - FOLLOW UP  Pharmacy Consult for TNA Indication: dysphagia, severe malnutrition related to esophageal cancer  Allergies  Allergen Reactions  . Erythromycin Nausea And Vomiting  . Morphine And Related Other (See Comments)    whelps on arms at iv site   Patient Measurements: Height: 5\' 3"  (160 cm) Weight: 111 lb 14.4 oz (50.758 kg) IBW/kg (Calculated) : 52.4  Vital Signs: Temp: 98.9 F (37.2 C) (11/13 0552) Temp src: Oral (11/13 0552) BP: 146/89 mmHg (11/13 0552) Pulse Rate: 94 (11/13 0552) Intake/Output from previous day: 11/12 0701 - 11/13 0700 In: 740 [I.V.:500] Out: 440 [Urine:440] Intake/Output from this shift: Total I/O In: 40 [Other:40] Out: -   Labs: No results found for this basename: WBC, HGB, HCT, PLT, APTT, INR,  in the last 72 hours   Recent Labs  09/16/13 0535 09/17/13 0910 09/18/13 0600  NA 136 135 129*  K 3.1* 3.8 4.0  CL 97 97 92*  CO2 27 27 24   GLUCOSE 87 107* 133*  BUN 5* 5* 10  CREATININE 0.45* 0.45* 0.39*  CALCIUM 8.8 8.7 8.6  MG  --  2.0 1.9  PHOS  --  4.1 3.5  PROT  --   --  6.2  ALBUMIN  --   --  2.2*  AST  --   --  18  ALT  --   --  10  ALKPHOS  --   --  102  BILITOT  --   --  0.3   Estimated Creatinine Clearance: 70.5 ml/min (by C-G formula based on Cr of 0.39).    Recent Labs  09/18/13 0025 09/18/13 0406 09/18/13 0811  GLUCAP 141* 141* 137*    Insulin Requirements in the past 24 hours:   SSI dc'd 11/11; fingerstick cbg 102, serum 107  Current Nutrition:  Clinimix E 5/15 at 49mL/hr + fat emulsion 20% at 84mL/hr- provides ~80% pt estimated needs. TF: Osmolite 1.2 at 20 ml/hr (goal 60 ml/hr) Clear liquid diet for enjoyment/comfort- poor appetite documented.  Nutritional Goals:  1600-1800 kCal, 80-100 grams of protein per day per RD assessment 11/8  Assessment: 10 YOF with h/o primary distal esophagus/GE junction with met involvement who is severely malnourished and has been unable  to eat for the past 2 days. She is scheduled for PEG placement 11/12.  Unable to place PEG due to tumor.  Feeding jejunostomy tube placed instead.   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 For that reason I placed a feeding jejunostomy tube approximately 10 inches distal to the ligament of Treitz" per Dr. Derrell Lolling. Pt tolerating TF (Osmolite 1.2) at 20 ml/hr - noted plan to increase to goal slowly over next 48 hours due to risk of refeeding; although lytes remain wnl at this time.  Endo: hx Graves' disease- home dose Synthroid PO- here is being started on Synthroid IV d/t elevated TSH  Lytes: Lytes wnl except for Na low at 129.   Renal: SCr stable.  Hepatobil: albumin low at 2.2, other lytes wnl. Prealbumin 11.7 -low.  Neuro: methadone 30mg  PO q8h  ID/Onc: has not yet started FOLFOX chemo treatment; afebrile. No abx.  Best Practices: subq hep  TPN Access: Port-A-Cath  TPN day#:  5  (ordered 11/8 but did not start until 11/9 due to miscommunication between RN, MD, and RPh)  Plan:  1. Decrease Clinimix E 5/15 to 30 ml/hr and decrease TPN as TF increases. Expect TPN will d/c tomorrow as expect TF to be at >75% of goal tomorrow. Will go ahead and d/c the lipids. 2. Will f/u TF tolerance  Christoper Fabian, PharmD, BCPS Clinical pharmacist, pager [phone] 09/18/2013 10:21 AM

## 2013-09-19 ENCOUNTER — Telehealth: Payer: Self-pay | Admitting: Oncology

## 2013-09-19 ENCOUNTER — Other Ambulatory Visit: Payer: Self-pay | Admitting: Oncology

## 2013-09-19 ENCOUNTER — Ambulatory Visit: Payer: Medicare Other

## 2013-09-19 DIAGNOSIS — C159 Malignant neoplasm of esophagus, unspecified: Secondary | ICD-10-CM

## 2013-09-19 DIAGNOSIS — Z934 Other artificial openings of gastrointestinal tract status: Secondary | ICD-10-CM

## 2013-09-19 DIAGNOSIS — R1110 Vomiting, unspecified: Secondary | ICD-10-CM

## 2013-09-19 DIAGNOSIS — E46 Unspecified protein-calorie malnutrition: Secondary | ICD-10-CM

## 2013-09-19 LAB — GLUCOSE, CAPILLARY
Glucose-Capillary: 135 mg/dL — ABNORMAL HIGH (ref 70–99)
Glucose-Capillary: 137 mg/dL — ABNORMAL HIGH (ref 70–99)
Glucose-Capillary: 117 mg/dL — ABNORMAL HIGH (ref 70–99)

## 2013-09-19 LAB — INFLUENZA PANEL BY PCR (TYPE A & B)
Influenza A By PCR: NEGATIVE
Influenza B By PCR: NEGATIVE

## 2013-09-19 LAB — BASIC METABOLIC PANEL
BUN: 9 mg/dL (ref 6–23)
CO2: 26 mEq/L (ref 19–32)
Chloride: 97 mEq/L (ref 96–112)
GFR calc non Af Amer: >90 mL/min (ref >90)
Glucose, Bld: 121 mg/dL — ABNORMAL HIGH (ref 70–99)
Potassium: 3.7 mEq/L (ref 3.5–5.1)
Sodium: 134 mEq/L — ABNORMAL LOW (ref 135–145)

## 2013-09-19 LAB — STREP PNEUMONIAE URINARY ANTIGEN: Strep Pneumo Urinary Antigen: NEGATIVE

## 2013-09-19 LAB — HIV ANTIBODY (ROUTINE TESTING W REFLEX): HIV: NONREACTIVE

## 2013-09-19 MED ORDER — HYDROMORPHONE HCL 2 MG PO TABS
4.0000 mg | ORAL_TABLET | ORAL | Status: DC | PRN
  Administered 2013-09-20: 4 mg via ORAL
  Filled 2013-09-19: qty 2

## 2013-09-19 MED ORDER — LEVALBUTEROL HCL 0.63 MG/3ML IN NEBU
0.6300 mg | INHALATION_SOLUTION | Freq: Four times a day (QID) | RESPIRATORY_TRACT | Status: DC | PRN
Start: 2013-09-19 — End: 2013-09-20

## 2013-09-19 MED ORDER — POTASSIUM CHLORIDE 20 MEQ/15ML (10%) PO LIQD
40.0000 meq | Freq: Once | ORAL | Status: AC
  Administered 2013-09-19: 13:00:00 40 meq
  Filled 2013-09-19: qty 30

## 2013-09-19 MED ORDER — HYDROMORPHONE HCL 1 MG/ML PO LIQD: 4.0000 mg | ORAL | Status: DC | PRN

## 2013-09-19 MED ORDER — PANTOPRAZOLE SODIUM 40 MG PO PACK
40.0000 mg | PACK | Freq: Every day | ORAL | Status: DC
  Administered 2013-09-19: 40 mg
  Filled 2013-09-19 (×2): qty 20

## 2013-09-19 MED ORDER — LEVOTHYROXINE SODIUM 125 MCG PO TABS
125.0000 ug | ORAL_TABLET | Freq: Every day | ORAL | Status: DC
  Administered 2013-09-19 – 2013-09-20 (×2): 125 ug
  Filled 2013-09-19 (×2): qty 1

## 2013-09-19 MED ORDER — LACTULOSE 10 GM/15ML PO SOLN
10.0000 g | Freq: Two times a day (BID) | ORAL | Status: DC
  Administered 2013-09-19: 10 g
  Filled 2013-09-19 (×3): qty 15

## 2013-09-19 MED ORDER — LEVOTHYROXINE SODIUM 100 MCG PO TABS
100.0000 ug | ORAL_TABLET | Freq: Every day | ORAL | Status: DC
  Filled 2013-09-19: qty 1

## 2013-09-19 MED ORDER — LACTULOSE 10 GM/15ML PO SOLN
10.0000 g | Freq: Two times a day (BID) | ORAL | Status: DC
  Administered 2013-09-19: 10:00:00 10 g via ORAL
  Filled 2013-09-19 (×2): qty 15

## 2013-09-19 MED ORDER — METHADONE HCL 10 MG PO TABS
30.0000 mg | ORAL_TABLET | Freq: Three times a day (TID) | ORAL | Status: DC
  Administered 2013-09-19 – 2013-09-20 (×3): 30 mg
  Filled 2013-09-19 (×4): qty 3

## 2013-09-19 MED ORDER — VANCOMYCIN HCL 500 MG IV SOLR
500.0000 mg | Freq: Two times a day (BID) | INTRAVENOUS | Status: DC
  Administered 2013-09-19 – 2013-09-20 (×3): 500 mg via INTRAVENOUS
  Filled 2013-09-19 (×4): qty 500

## 2013-09-19 MED ORDER — VANCOMYCIN HCL 500 MG IV SOLR
500.0000 mg | Freq: Two times a day (BID) | INTRAVENOUS | Status: DC
  Filled 2013-09-19 (×2): qty 500

## 2013-09-19 MED ORDER — DEXTROSE 5 % IV SOLN
1.0000 g | Freq: Three times a day (TID) | INTRAVENOUS | Status: DC
  Administered 2013-09-19 – 2013-09-20 (×5): 1 g via INTRAVENOUS
  Filled 2013-09-19 (×7): qty 1

## 2013-09-19 MED ORDER — FENTANYL 25 MCG/HR TD PT72
25.0000 ug | MEDICATED_PATCH | TRANSDERMAL | Status: DC
  Administered 2013-09-19: 13:00:00 25 ug via TRANSDERMAL
  Filled 2013-09-19: qty 1

## 2013-09-19 MED ORDER — FUROSEMIDE 10 MG/ML IJ SOLN
40.0000 mg | Freq: Once | INTRAMUSCULAR | Status: AC
  Administered 2013-09-19: 13:00:00 40 mg via INTRAVENOUS
  Filled 2013-09-19: qty 4

## 2013-09-19 NOTE — Progress Notes (Signed)
2 Days Post-Op  Subjective: Stable and alert. Tolerating tube feedings at 40 cc per hour. J-tube working well.  Passing flatus but no stool. Abdominal pain is significantly improved, subjectively.  Started on Maxipime yesterday for presumed HAP.Marland Kitchen  Objective: Vital signs in last 24 hours: Temp:  [97.4 F (36.3 C)-98.9 F (37.2 C)] 98.5 F (36.9 C) (11/14 0434) Pulse Rate:  [92-99] 99 (11/13 2350) Resp:  [14-18] 16 (11/14 0434) BP: (115-143)/(74-87) 121/75 mmHg (11/14 0434) SpO2:  [94 %-100 %] 94 % (11/14 0434) Weight:  [116 lb 10 oz (52.9 kg)] 116 lb 10 oz (52.9 kg) (11/14 0115) Last BM Date: 09/16/13  Intake/Output from previous day: 11/13 0701 - 11/14 0700 In: 5091 [I.V.:898.3; NG/GT:540.2; TPN:3542.5] Out: 350 [Urine:350] Intake/Output this shift: Total I/O In: 5051 [I.V.:898.3; Other:70; NG/GT:540.2; TPN:3542.5] Out: -   General appearance: alert and cooperative. Mental status normal. Deconditioned. No increased work of breathing. In no distress. GI: abdomen soft. Hypoactive bowel sounds. Midline wound and to site looks fine.  Lab Results:  Results for orders placed during the hospital encounter of 09/12/13 (from the past 24 hour(s))  GLUCOSE, CAPILLARY     Status: Abnormal   Collection Time    09/18/13  8:11 AM      Result Value Range   Glucose-Capillary 137 (*) 70 - 99 mg/dL  TSH     Status: Abnormal   Collection Time    09/18/13 11:00 AM      Result Value Range   TSH 9.440 (*) 0.350 - 4.500 uIU/mL  GLUCOSE, CAPILLARY     Status: Abnormal   Collection Time    09/18/13 12:04 PM      Result Value Range   Glucose-Capillary 162 (*) 70 - 99 mg/dL  GLUCOSE, CAPILLARY     Status: Abnormal   Collection Time    09/18/13  8:07 PM      Result Value Range   Glucose-Capillary 123 (*) 70 - 99 mg/dL  GLUCOSE, CAPILLARY     Status: Abnormal   Collection Time    09/18/13 11:37 PM      Result Value Range   Glucose-Capillary 137 (*) 70 - 99 mg/dL  GLUCOSE, CAPILLARY      Status: Abnormal   Collection Time    09/19/13  4:36 AM      Result Value Range   Glucose-Capillary 135 (*) 70 - 99 mg/dL     Studies/Results: @RISRSLT24 @  . ceFEPime (MAXIPIME) IV  1 g Intravenous Q8H  . enoxaparin (LOVENOX) injection  40 mg Subcutaneous Q24H  . lactulose  10 g Oral BID  . levothyroxine  75 mcg Intravenous Daily  . methadone  30 mg Oral Q8H  . nicotine  21 mg Transdermal Daily  . pantoprazole (PROTONIX) IV  40 mg Intravenous Q24H  . sodium chloride  3 mL Intravenous Q12H  . vancomycin  500 mg Intravenous Q12H     Assessment/Plan: s/p Procedure(s): FEEDING JEJUNOSTOMY TUBE PLACEMENT  POD #2. Feeding jejunostomy tube placement. Stable. Tolerating tube feedings. Slowly advance tube feeds per nutritional service protocol  recommend wean off of TNA at this time. Goal is outpatient management without IV support, hopefully.   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  Operative findings significant for progressive upper abdominal retroperitoneal tumor burden with fixation of stomach to retroperitoneum.   Severe protein calorie malnutrition.   Constipation. Hard stool noted throughout colon at time of surgery. Will give lactulose per jejunostomy tube for 4 doses over  the next 48 hours.  Possible hospital acquired pneumonia.  Agree that we should consider palliative care consult at some point. Defer timing of this to Dr. Darrold Span, her primary oncologist.   Chronic pancreatitis  on methadone maintenance for drug history, Dr. Betti Cruz.  History laparoscopic cholecystectomy  History epigastric hernia repair  Recent Port-A-Cath placement  Status post treatment of Graves' disease, on replacement therapy. TSH normalizing.  Multiple sclerosis, minimally symptomatic  Long QT syndrome  Long history tobacco use   @PROBHOSP @  LOS: 7 days    Odetta Forness M 09/19/2013  . .prob

## 2013-09-19 NOTE — Progress Notes (Signed)
[facility]                                                                                Patient Demographics  [name], is a 47 y.o. female, DOB - 1966/07/03, YQM:[id number]  Admit date - 09/12/2013   Admitting Physician [name], MD  Outpatient Primary MD for the patient is AVBUERE,EDWIN A, MD  LOS - 7  Subjective:   Minimal abdominal pain, she has severe back pain. I explained to her that she has vertebral metastasis and this is probably where the pain coming from. Cough/shortness of breath last night. X-ray showed questionable pneumonia patient mentioned she might be causing the tube feeds  Chief Complaint  Patient presents with  . Abdominal Pain  . Nausea  . 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 She was due for a feeding tube placement by general surgery in the outpatient setting on 09/17/2013, however she was unable to keep any food down for the last week and came to the hospital much earlier, while in the hospital she was seen by general surgery who are going to do the feeding tube placement on 09/17/2013 as per previous schedule, in the meantime patient has been started on TNA via PICC line. She is relatively symptom-free except for chronic left upper quadrant pain and back pain due to her metastatic disease.   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 - Causing chronic left upper quadrant abdominal pain and dysphagia, unable to take proper diet and now has severe protein calorie malnutrition with failure to thrive.  -Started on TPN by pharmacy via her  right-sided Port-A-Cath.  -Continue chronic methadone along with short-acting narcotics for breakthrough pain, liquid diet for pleasure and -comfort with aspiration precautions. She follows with Dr. Darrold Span for her oncology problems. -According to Dr. 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 J-tube placed instead. -Plan was to discharge patient today, patient concern about how she can give herself the medication through the tube. -RN to demonstrate and teach the patient how to inject medication via tube.  Pneumonia -Questionable pneumonia, patient did have shortness of breath and cough last night. -Chest x-ray obtained and showed questionable pneumonia. Patient concerned that she was cough and tube feeds. -Healthcare associated pneumonia versus aspiration of the tube feeds. -Started on cefepime and vancomycin, no fever or leukocytosis. Can probably discharged on Levaquin.  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 No acute issues. Lipase stable.  History of Graves' disease Status post induced hypothyroidism. Will continue Synthroid at 75 mcg IV every 24 hours which is higher than her home dose since TSH is elevated and as oral intake is unreliable.  Lab Results  Component Value Date   TSH 9.440* 09/18/2013  Repeat TSH has been ordered  Mildly prolonged QT on EKG. No dysrhythmia on telemetry. Continue telemetry monitoring, IV magnesium given. We'll monitor electrolytes closely  Chronic pain syndrome. Continue home dose methadone and short-acting narcotics for breakthrough.  Hypokalemia. Replaced IV repeat in the morning.   History of MS. Outpatient followup with neurologist post discharge. No acute issues.  Severe malnutrition -In settings of advanced metastatic adenocarcinoma of the esophagus. -Patient currently on TPN, G-tube placed yesterday and tube feeding started.  Hyponatremia -With hypochloremia, likely patient is  not getting enough sodium, we'll increase NS rate.   Code Status: Full, long term prognosis is poor.  Family Communication:    Disposition Plan: Home   Procedures -   L arm PICC line, J-tube placed by general surgery on 09/17/2013   Consults  CCS, Hem/Onc   Medications  Scheduled Meds: . ceFEPime (MAXIPIME) IV  1 g Intravenous Q8H  . enoxaparin (LOVENOX) injection  40 mg Subcutaneous Q24H  . fentaNYL  25 mcg Transdermal Q72H  . furosemide  40 mg Intravenous Once  . lactulose  10 g Per Tube BID  . levothyroxine  125 mcg Per Tube Daily  . methadone  30 mg Per Tube Q8H  . nicotine  21 mg Transdermal Daily  . pantoprazole sodium  40 mg Per Tube q1800  . potassium chloride  40 mEq Per Tube Once  . sodium chloride  3 mL Intravenous Q12H  . vancomycin  500 mg Intravenous Q12H   Continuous Infusions: . feeding supplement (OSMOLITE 1.2 CAL) 1,000 mL (09/19/13 0123)  . Marland KitchenTPN (CLINIMIX-E) Adult 30 mL/hr at 09/18/13 1809   PRN Meds:.acetaminophen, acetaminophen, dextrose, fentaNYL, glucose-Vitamin C, HYDROcodone-acetaminophen, HYDROmorphone (DILAUDID) injection, HYDROmorphone, HYDROmorphone HCl, levalbuterol, LORazepam, ondansetron (ZOFRAN) IV, ondansetron (ZOFRAN) IV, ondansetron, sodium chloride, sodium chloride, white petrolatum  DVT Prophylaxis    Heparin   Lab Results  Component Value Date   PLT 326 09/15/2013    Antibiotics    Anti-infectives   Start     Dose/Rate Route Frequency Ordered Stop   09/19/13 0100  ceFEPIme (MAXIPIME) 1 g in dextrose 5 % 50 mL IVPB     1 g 100 mL/hr over 30 Minutes Intravenous 3 times per day 09/19/13 0031 09/26/13 2159   09/19/13 0100  vancomycin (VANCOCIN) 500 mg in sodium chloride 0.9 % 100 mL IVPB  Status:  Discontinued     500 mg 100 mL/hr over 60 Minutes Intravenous Every 12 hours 09/19/13 0041 09/19/13 0044   09/19/13 0100  vancomycin (VANCOCIN) 500 mg in sodium chloride 0.9 % 100 mL IVPB     500 mg 100 mL/hr over 60 Minutes  Intravenous Every 12 hours 09/19/13 0044 09/27/13 0059   09/17/13 1055  ceFAZolin (ANCEF) 2-3 GM-% IVPB SOLR    Comments:  Cato, Sarah   : cabinet override      09/17/13 1055 09/17/13 1114   09/16/13 0645  ceFAZolin (ANCEF) IVPB 2 g/50 mL premix  Status:  Discontinued     2 g 100 mL/hr over 30 Minutes Intravenous On call to O.R. 09/16/13 0641 09/16/13 0654   09/16/13 0645  ceFAZolin (ANCEF) IVPB 2 g/50 mL premix  Status:  Discontinued     2 g 100 mL/hr over 30 Minutes Intravenous On call to O.R. 09/16/13 0641 09/17/13 1359            Objective:   Filed Vitals:   09/18/13 2141 09/18/13 2350 09/19/13 0115 09/19/13 0434  BP: 125/81 115/74  121/75  Pulse: 96 99    Temp:  97.8 F (36.6 C) 98.9 F (37.2 C)  98.5 F (36.9 C)  TempSrc: Oral Oral  Oral  Resp: 18 14  16   Height:      Weight:   52.9 kg (116 lb 10 oz)   SpO2: 96% 95%  94%    Wt Readings from Last 3 Encounters:  09/19/13 52.9 kg (116 lb 10 oz)  09/19/13 52.9 kg (116 lb 10 oz)  09/09/13 49.714 kg (109 lb 9.6 oz)     Intake/Output Summary (Last 24 hours) at 09/19/13 1246 Last data filed at 09/19/13 0840  Gross per 24 hour  Intake   5051 ml  Output    650 ml  Net   4401 ml    Exam Awake Alert, Oriented X 3, No new F.N deficits, Normal affect [place].AT,PERRAL Supple Neck,No JVD, No cervical lymphadenopathy appriciated.  Symmetrical Chest wall movement, Good air movement bilaterally, CTAB RRR,No Gallops,Rubs or new Murmurs, No Parasternal Heave +ve B.Sounds, Abd Soft, Non tender, No organomegaly appriciated, No rebound - guarding or rigidity. No Cyanosis, Clubbing or edema, No new Rash or bruise      Data Review   Micro Results Recent Results (from the past 240 hour(s))  SURGICAL PCR SCREEN     Status: None   Collection Time    09/17/13  7:17 AM      Result Value Range Status   MRSA, PCR NEGATIVE  NEGATIVE Final   Staphylococcus aureus NEGATIVE  NEGATIVE Final   Comment:            The Xpert SA Assay  (FDA     approved for NASAL specimens     in patients over 78 years of age),     is one component of     a comprehensive surveillance     program.  Test performance has     been validated by [employer] for patients greater     than or equal to 52 year old.     It is not intended     to diagnose infection nor to     guide or monitor treatment.    Radiology Reports Dg Chest 2 View  09/12/2013   CLINICAL DATA:  Body pain, cough  EXAM: CHEST  2 VIEW  COMPARISON:  CT chest 07/31/2013  FINDINGS: Clear lungs. Normal heart and mediastinum. Unremarkable osseous structures. Right-sided Port-A-Cath with the tip projecting over the proximal IVC.  IMPRESSION: No active cardiopulmonary disease.   Electronically Signed   By: [name]   On: 09/12/2013 12:02         CBC  Recent Labs Lab 09/12/13 1305 09/13/13 0400 09/14/13 0455 09/15/13 0540  WBC 7.2 5.1 4.8 5.7  HGB 14.0 13.1 13.1 14.4  HCT 40.1 37.2 37.6 40.5  PLT 386 355 349 326  MCV 90.9 90.7 91.5 91.2  MCH 31.7 32.0 31.9 32.4  MCHC 34.9 35.2 34.8 35.6  RDW 14.3 14.2 14.4 14.3  LYMPHSABS 0.7  --  0.8 0.9  MONOABS 0.4  --  0.4 0.5  EOSABS 0.2  --  0.2 0.2  BASOSABS 0.0  --  0.0 0.1    Chemistries   Recent Labs Lab 09/12/13 1305 09/13/13 0400 09/14/13 0455 09/15/13 0540 09/16/13 0535 09/17/13 0910 09/18/13 0600 09/19/13 0515  NA 132* 133* 136 133* 136 135 129* 134*  K 3.7 2.9* 3.7 3.2* 3.1* 3.8 4.0 3.7  CL 96 95* 98 97 97 97 92* 97  CO2 24  24 24 26 27 27 24 26   GLUCOSE 69* 51* 64* 122* 87 107* 133* 121*  BUN 3* <3* <3* <3* 5* 5* 10 9  CREATININE 0.41* 0.40* 0.36* 0.37* 0.45* 0.45* 0.39* 0.35*  CALCIUM 8.2* 7.8* 8.1* 8.7 8.8 8.7 8.6 8.1*  MG  --  1.7 2.1 2.1  --  2.0 1.9  --   AST 52* 40* 32 26  --   --  18  --   ALT 21 17 13 15   --   --  10  --   ALKPHOS 133* 123* 123* 133*  --   --  102  --   BILITOT 0.3 0.3 0.3 0.2*  --   --  0.3  --     ------------------------------------------------------------------------------------------------------------------ estimated creatinine clearance is 72.7 ml/min (by C-G formula based on Cr of 0.35). ------------------------------------------------------------------------------------------------------------------ No results found for this basename: HGBA1C,  in the last 72 hours ------------------------------------------------------------------------------------------------------------------ No results found for this basename: CHOL, HDL, LDLCALC, TRIG, CHOLHDL, LDLDIRECT,  in the last 72 hours ------------------------------------------------------------------------------------------------------------------  Recent Labs  09/18/13 1100  TSH 9.440*   ------------------------------------------------------------------------------------------------------------------ No results found for this basename: VITAMINB12, FOLATE, FERRITIN, TIBC, IRON, RETICCTPCT,  in the last 72 hours  Coagulation profile No results found for this basename: INR, PROTIME,  in the last 168 hours  No results found for this basename: DDIMER,  in the last 72 hours  Cardiac Enzymes No results found for this basename: CK, CKMB, TROPONINI, MYOGLOBIN,  in the last 168 hours ------------------------------------------------------------------------------------------------------------------ No components found with this basename: POCBNP,      Time Spent in minutes 35   Clevester Helzer A M.D on 09/19/2013 at 12:46 PM  Between 7am to 7pm - Pager - [phone]  After 7pm go to [email or web address] - password [email or web address]  And look for the night coverage person covering for me after hours  [facility] Office  [phone]

## 2013-09-19 NOTE — Care Management Note (Signed)
    Page 1 of 2   09/19/2013     10:56:55 AM   CARE MANAGEMENT NOTE 09/19/2013  Patient:  [name] A   Account Number:  [id number]  Date Initiated:  09/16/2013  Documentation initiated by:  [facility]  Subjective/Objective Assessment:   dx  Carcinoma of distal third of esophagus, mets to bone     Action/Plan:   lives at home with children   Anticipated DC Date:  09/19/2013   Anticipated DC Plan:  HOME W HOME HEALTH SERVICES      DC Planning Services  CM consult      [facility] Choice  HOME HEALTH  Resumption Of Svcs/PTA Provider   Choice offered to / List presented to:  [name]        HH arranged  HH-1 RN      [name] agency  [facility]   Status of service:  Completed, signed off Medicare Important Message given?   (If response is "NO", the following Medicare IM given date fields will be blank) Date Medicare IM given:   Date Additional Medicare IM given:    Discharge Disposition:  HOME W HOME HEALTH SERVICES  Per UR Regulation:  Reviewed for med. necessity/level of care/duration of stay  If discussed at Long Length of Stay Meetings, dates discussed:    Comments:  09/19/13 8:40 Alicia Cape RN, BSN [phone] patient had j tube put in on 11/13 was for dc home with [name] with [name] who she is active with.  Patient was too drowsy yesterday, and cxr showed hap.  Patient will be weaned off tpn today.  Patient is for dc today.  [facility] will resume HHRN for j tube.  [name] with [facility] notified that patient may be dc today.  Also referral made to Chaplain to asst patient with Advance Directives.

## 2013-09-19 NOTE — Progress Notes (Signed)
09/19/2013, 10:09 AM  Hospital day 8 Antibiotics: Day 1 maxipime and IV vanc Chemotherapy: none started as yet; plan FOLFOX in palliative attempt outpatient Post op day 2 jejunostomy   Subjective: Pain not controlled especially in back, still some pain around J tube. Vomited just now, as well as some vomiting yesterday. No increased SOB, no cough, no chest pain. Willing to use incentive spirometer. Begun on antibiotics for possible hospital pneumonia early this AM. Still trying to swallow methadone, still using IV dilaudid; is willing to use J tube for meds. Discussed transitioning meds to J tube, including dilaudid liquid, and adding fentanyl patch. She requests DNR status and would like to complete Advance Directive papers in hospital now. She does want to continue interventions that might improve situation, but does not want intubation other resuscitation/ life support given this extensively metastatic esophageal cancer.  Objective: Vital signs in last 24 hours: Blood pressure 121/75, pulse 99, temperature 98.5 F (36.9 C), temperature source Oral, resp. rate 16, height 5\' 3"  (1.6 m), weight 116 lb 10 oz (52.9 kg), last menstrual period 04/28/2011, SpO2 94.00%.   Intake/Output from previous day: 11/13 0701 - 11/14 0700 In: 5091 [I.V.:898.3; NG/GT:540.2; TPN:3542.5] Out: 350 [Urine:350] Intake/Output this shift: Total I/O In: -  Out: 650 [Urine:650]  Physical exam: awake, alert, looks uncomfortable from back pain but NAD. Respirations not labored RA at 30 degrees, no cough. Oral mucosa slightly less dry, no lesions. Not icteric. Lungs without wheezes or rales. PAC site ok right chest, TNA being tapered off. PICC left upper arm site ok and no swelling either UE. Abdomen quiet, slightly distended. J tube dressing with no additional drainage, still dried area as yesterday. LE no edema or cords. Moves all extremities in bed. No skin rash or ecchymoses.  Lab Results: No results found  for this basename: WBC, HGB, HCT, PLT,  in the last 72 hours last CBC 11-10. BMET  Recent Labs  09/18/13 0600 09/19/13 0515  NA 129* 134*  K 4.0 3.7  CL 92* 97  CO2 24 26  GLUCOSE 133* 121*  BUN 10 9  CREATININE 0.39* 0.35*  CALCIUM 8.6 8.1*   Electrolytes improved since NS yesterday Studies/Results: Dg Chest Port 1 View  09/19/2013   CLINICAL DATA:  Chest congestion on auscultation.  EXAM: PORTABLE CHEST - 1 VIEW  COMPARISON:  09/12/2013.  FINDINGS: Normal sized heart. Interval small amount of linear and ill-defined density at the left lung base. The remainder of the lungs are clear. Stable right jugular port catheter. Interval left PICC with its tip in the region of the proximal left subclavian vein. Unremarkable bones. Cholecystectomy clips.  IMPRESSION: 1. Interval minimal left basilar atelectasis and possible pneumonia. 2. Left PICC tip in the proximal left subclavian vein.  If   Electronically Signed   By: Gordan Payment M.D.   On: 09/19/2013 00:21     Assessment/Plan: 1. 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 G tube not possible due to extent of metastatic tumor, with jejunostomy done by Dr Derrell Lolling on 09-17-13. Plan is for FOLFOX outpatient in palliative attempt if situation allows. Hospice will not assist while chemotherapy is used; if she is not able to have chemo, or prefers not to have chemo, she is otherwise appropriate for Hospice. Need to give meds by J tube instead of po as possible, including methadone if ok per pharmacy, and liquid dilaudid. Will add duragesic 25 mcg patch q 72 hrs. 2. DNR per patient's request:  order entered. I have requested case management and nursing to help complete/ notarize documents in hospital today. 3.chronic pancreatitis since cholecystectomy 1993, better in past 2 years post sphincterotomy and stents. Followed by Dr [name].  4.Severe malnutrition in context of acute illness: now on short term TNA and tube feeds  begun via jejunostomy Pharmacy and hospital dietician following closely due to high risk refeeding syndrome. [facility] dietician available to assist with tube feeds after DC. TNA being weaned with plans to discontinue prior to DC from hospital when tube feeding appropriate.  5.PAC in, being used for TNA now, and PICC for other in hospital needs. Will need only PAC after TNA DCd.  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 Managed outpatient by Dr Betti Cruz, who is aware of esophageal cancer and need for other pain medication, and may adjust the methadone maintenance accordingly. 12. 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 Bilateral adrenal hyperplasia by last scans: AM cortisol ok 14.Hyponatremia/ hypochloremia: improved with IV NS in last 24 hrs.  She needs to be controlled with meds by J tube before DC. [facility] was involved PTA for IVF. I will see her back at [facility] next Friday 11-21. Please call oncologist on call this weekend if our group is needed then. Thank you  Krisandra Bueno P [phone]

## 2013-09-19 NOTE — Progress Notes (Signed)
ANTIBIOTIC CONSULT NOTE - INITIAL  Pharmacy Consult for Vancomycin  Indication: rule out pneumonia  Allergies  Allergen Reactions  . Erythromycin Nausea And Vomiting  . Morphine And Related Other (See Comments)    whelps on arms at iv site    Patient Measurements: Height: 5\' 3"  (160 cm) Weight: 111 lb 14.4 oz (50.758 kg) IBW/kg (Calculated) : 52.4  Vital Signs: Temp: 98.9 F (37.2 C) (11/13 2350) Temp src: Oral (11/13 2350) BP: 115/74 mmHg (11/13 2350) Pulse Rate: 99 (11/13 2350) Intake/Output from previous day: 11/13 0701 - 11/14 0700 In: 5051 [I.V.:898.3; NG/GT:540.2; TPN:3542.5] Out: 350 [Urine:350] Intake/Output from this shift: Total I/O In: 5011 [I.V.:898.3; Other:30; NG/GT:540.2; TPN:3542.5] Out: -   Labs:  Recent Labs  09/16/13 0535 09/17/13 0910 09/18/13 0600  CREATININE 0.45* 0.45* 0.39*   Estimated Creatinine Clearance: 70.5 ml/min (by C-G formula based on Cr of 0.39).  Microbiology: Recent Results (from the past 720 hour(s))  SURGICAL PCR SCREEN     Status: None   Collection Time    09/17/13  7:17 AM      Result Value Range Status   MRSA, PCR NEGATIVE  NEGATIVE Final   Staphylococcus aureus NEGATIVE  NEGATIVE Final   Comment:            The Xpert SA Assay (FDA     approved for NASAL specimens     in patients over 82 years of age),     is one component of     a comprehensive surveillance     program.  Test performance has     been validated by [employer] for patients greater     than or equal to 71 year old.     It is not intended     to diagnose infection nor to     guide or monitor treatment.    Medical History: Past Medical History  Diagnosis Date  . Multiple sclerosis   . Irritable bowel syndrome   . Arthritis   . Pancreatitis   . Anxiety   . Depression   . Chronic pain   . Migraine   . PTSD (post-traumatic stress disorder)   . Methadone use   . PONV (postoperative nausea and vomiting)   . 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 Grave's disease   . hypothyroidism   . Neuromuscular disorder   . Substance abuse    Assessment: 47 y/o F with esophageal CA to start vancomycin per Rx (cefepime per MD) for possible PNA on CXR. WBC on 11/10 was WNL, afebrile, CrCl ~ 70.   Goal of Therapy:  Vancomycin trough level 15-20 mcg/ml  Plan:  -Vancomycin 500 mg IV q12h -Cefepime per MD -Trend WBC, temp, renal function  -F/U cultures, imaging   Thank you for allowing me to take part in this patient's care,  Abran Duke, PharmD Clinical Pharmacist Phone: [phone] Pager: [phone] 09/19/2013 12:39 AM
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
## 2013-09-19 NOTE — Progress Notes (Signed)
Pt with a productive cough and wet lung sounds. There is no residual from the j-tube. Pt is currently receiving 30ml of osmolite an hour, NS@100 , and TPN @30 . Claiborne Billings, NP notified. Fluids changed to [facility] and a stat chest xray was ordered. Will continue to monitor.

## 2013-09-19 NOTE — Discharge Summary (Deleted)
Physician Discharge Summary  [name] ION:[id number] DOB: [birth year] DOA: 09/12/2013  PCP: [name], MD  Admit date: 09/12/2013 Discharge date: 09/19/2013  Time spent: 45 minutes  Recommendations for Outpatient Follow-up:  1. Pt needs to be followed by home health for her J tube care and Port a cath care 2. Please follow up with your oncologist within next week to discuss further treatment of your cancer, etc 3. Palliative care may be appropriate at this time 4. Pt on Levaquin 750 mg q day for pneumonia 5. PCP needs to recheck TSH levels in 6 weeks and possibly adjust synthroid dose 6. 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 Pancreatitis - sphincter of Oddi dysfunction - no acute issues - lipase is now stable  2. 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 Darrold Span, needed   3. Graves Disease -s/p induced hypothyroidism - was given IV synthroid at 75 mcg q 24 due to elevated TSH -continue oral synthroid dose as usual upon discharge -PCP should recheck TSH in 6 weeks and make necessary adjustments as needed - most recent TSH on 11/13 was 9.44    Discharge Condition: stable, poor  prognosis Diet recommendation: J tube feedings  Filed Weights   09/12/13 1653 09/18/13 0552 09/19/13 0115  Weight: 49.442 kg (109 lb) 50.758 kg (111 lb 14.4 oz) 52.9 kg (116 lb 10 oz)    History of present illness:  Ms. [name] is a 47 yo female who presented with a PMHx of MS and esophageal cancer with metastasis to the T spine.  She presented with a several week history of being unable to eat and keep anything down, along with severe pain in her epigastric area radiating to her back.  She developed severe protein calorie malnutrition and was started on TNA via PICC line.  She then had a J tube placed after a G tube could not be placed on 09/17/13 due to carcinomatosis.  She is feeling better now, but still has a lot of pain due to her cancer, both in her abdomen and back.  Hospital Course:  Admitted for protein calorie malnutrition and pancreatitis.  TNA via PICC line initiated.  J tube placed 09/17/13.  Started pt on maxipime and vancomycin on 09/19/13 for HCAP as evidenced by chest xray.  Pt stable to be discharged home today on oral abx, and pain meds.  Procedures:  PICC line placed  J tube placed  Chest xray  Consultations:  Surgery  Nutrition  Oncology  radiology  Discharge Exam: Filed Vitals:   09/19/13 0434  BP: 121/75  Pulse:   Temp: 98.5 F (36.9 C)  Resp: 16    General: ill appearing female lying in bed, obviously in pain Cardiovascular: regular  rhythm, slightly inc rate likely due to pain, normal S1 S2, no m/g/r Respiratory: slear to auscultation bilaterally, no inc work of breathing GI: +BS, tender throughout with guarding, mass appreciated, firm abdomen as expected MSK: ROM intact x 4, no edema  Discharge Instructions       Future Appointments Provider Department Dept Phone   10/22/2013 9:20 AM Maryln Gottron, MD [facility] [phone]      Anti-infectives   Start     Dose/Rate Route Frequency Ordered Stop    09/19/13 0100  ceFEPIme (MAXIPIME) 1 g in dextrose 5 % 50 mL IVPB     1 g 100 mL/hr over 30 Minutes Intravenous 3 times per day 09/19/13 0031 09/26/13 2159   09/19/13 0100  vancomycin (VANCOCIN) 500 mg in sodium chloride 0.9 % 100 mL IVPB  Status:  Discontinued     500 mg 100 mL/hr over 60 Minutes Intravenous Every 12 hours 09/19/13 0041 09/19/13 0044   09/19/13 0100  vancomycin (VANCOCIN) 500 mg in sodium chloride 0.9 % 100 mL IVPB     500 mg 100 mL/hr over 60 Minutes Intravenous Every 12 hours 09/19/13 0044 09/27/13 0059   09/17/13 1055  ceFAZolin (ANCEF) 2-3 GM-% IVPB SOLR    Comments:  Cato, Sarah   : cabinet override      09/17/13 1055 09/17/13 1114   09/16/13 0645  ceFAZolin (ANCEF) IVPB 2 g/50 mL premix  Status:  Discontinued     2 g 100 mL/hr over 30 Minutes Intravenous On call to O.R. 09/16/13 0630 09/16/13 0654   09/16/13 0645  ceFAZolin (ANCEF) IVPB 2 g/50 mL premix  Status:  Discontinued     2 g 100 mL/hr over 30 Minutes Intravenous On call to O.R. 09/16/13 0641 09/17/13 1359          Medication List    ASK your doctor about these medications       busPIRone 15 MG tablet  Commonly known as:  BUSPAR  Take 15 mg by mouth 2 (two) times daily.     escitalopram 10 MG tablet  Commonly known as:  LEXAPRO  Take 30 mg by mouth daily after supper.     esomeprazole 20 MG capsule  Commonly known as:  NEXIUM  Take 20 mg by mouth daily before breakfast.     imipramine 50 MG tablet  Commonly known as:  TOFRANIL  Take 100 mg by mouth at bedtime.     levothyroxine 100 MCG tablet  Commonly known as:  SYNTHROID, LEVOTHROID  Take 100 mcg by mouth daily before breakfast.     lidocaine-prilocaine cream  Commonly known as:  EMLA  Apply 1-2 hours prior to Porta-Cath accessed     magic mouthwash w/lidocaine Soln  1part nystatin,1part Maaloxplus,1part benadryl,3part 2%viscous lidocaine. swallow 10 mL up to QID, before meals/bedtime     Melatonin 3 MG Tabs  Take 3-6 mg  by mouth at bedtime as needed (For sleep.).     methadone 10 MG/5ML solution  Commonly known as:  DOLOPHINE  Take 94 mg by mouth every morning.     multivitamin with minerals Tabs tablet  Take 1 tablet by mouth at bedtime.     nicotine 14 mg/24hr patch  Commonly known as:  NICODERM CQ - dosed in mg/24 hours  Place 1 patch onto the skin daily.     ondansetron 8 MG tablet  Commonly known as:  ZOFRAN  Take 8 mg by mouth every 8 (  eight) hours as needed for nausea.     oxyCODONE 5 MG/5ML solution  Commonly known as:  ROXICODONE  Take 5-10 mLs (5-10 mg total) by mouth every 4 (four) hours as needed.     potassium chloride 10 MEQ tablet  Commonly known as:  K-DUR,KLOR-CON  Take 10 mEq by mouth 2 (two) times daily.       Allergies  Allergen Reactions  . Erythromycin Nausea And Vomiting  . Morphine And Related Other (See Comments)    whelps on arms at iv site   Follow-up Information   Follow up with LIVESAY,LENNIS P, MD. Schedule an appointment as soon as possible for a visit in 1 week.   Specialty:  Oncology   Contact information:   [address] [place] [address] [phone]        The results of significant diagnostics from this hospitalization (including imaging, microbiology, ancillary and laboratory) are listed below for reference.    Significant Diagnostic Studies: Dg Chest 2 View  09/12/2013   CLINICAL DATA:  Body pain, cough  EXAM: CHEST  2 VIEW  COMPARISON:  CT chest 07/31/2013  FINDINGS: Clear lungs. Normal heart and mediastinum. Unremarkable osseous structures. Right-sided Port-A-Cath with the tip projecting over the proximal IVC.  IMPRESSION: No active cardiopulmonary disease.   Electronically Signed   By: [name]   On: 09/12/2013 12:02   US Renal  09/16/2013   CLINICAL DATA:  History of left flank pain.  EXAM: RENAL/URINARY TRACT ULTRASOUND COMPLETE  COMPARISON:  09/11/2012 ultrasound. CT 07/31/2013.  FINDINGS: Right Kidney  Length: Right renal  length is 10.5 cm. Echogenicity within normal limits. No mass, calculus, parenchymal loss, or hydronephrosis visualized.  Left Kidney  Length: Left renal length is 11.3 cm. Echogenicity within normal limits. No mass, calculus, parenchymal loss, or hydronephrosis visualized.  Bladder  Appears normal for degree of bladder distention.  IMPRESSION: Normal ultrasonic appearance of the kidneys. No evidence of hydronephrosis. No bladder lesion identified.   Electronically Signed   By: Onalee Hua  Call M.D.   On: 09/16/2013 14:26   Ir Fluoro Guide Cv Line Left  09/15/2013   CLINICAL DATA:  Multiple sclerosis, irritable bowel syndrome, poor venous access.  EXAM: MIDLINE CATHETER PLACEMENT WITH ULTRASOUND AND FLUOROSCOPY  TECHNIQUE: After written informed consent was obtained, patient was placed in the supine position on angiographic table. Left side approach was selected because of an indwelling right port catheter. Patency of the left basilic vein was confirmed with ultrasound with image documentation. An appropriate skin site was determined. Skin site was marked. Region was prepped using maximum barrier technique including cap and mask, sterile gown, sterile gloves, large sterile sheet, and Chlorhexidine as cutaneous antisepsis. The region was infiltrated locally with 1% lidocaine. Under real-time ultrasound guidance, the left basilic vein was accessed with a 21 gauge micropuncture needle; the needle tip within the vein was confirmed with ultrasound image documentation. Needle exchanged over a 018 guidewire for a peel-away sheath. The guidewire would not advance all way to the SVC, consistently diverted at the level of the left IJ vein. 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 A midline catheter placement was therefore performed. Through the peel-away sheath a 5-French double-lumen power injectable PICC trimmed to 27cm was  advanced, positioned with its tip in the central aspect of the left subclavian vein. Spot chest radiograph confirms appropriate catheter position. Catheter was flushed per protocol and secured externally with 0-Prolene  sutures. The patient tolerated procedure well, with no immediate complication.  FLUOROSCOPY TIME:  30 seconds  IMPRESSION: 1. Long segment left innominate vein occlusion. 2. Technically successful five [place] double lumen power injectable left arm midline catheter placement   Electronically Signed   By: Oley Balm M.D.   On: 09/15/2013 10:39   Ir Fluoro Guide Cv Line Right  08/22/2013   CLINICAL DATA:  47 year old with esophageal cancer.  EXAM: FLUOROSCOPIC AND ULTRASOUND GUIDED PLACEMENT OF A SUBCUTANEOUS PORT.  Physician: Rachelle Hora. Lowella Dandy, MD  MEDICATIONS AND MEDICAL HISTORY: Versed 4 mg, fentanyl 200 mcg. Ancef 2 g. Zofran 4 mg. A radiology nurse monitored the patient for moderate sedation. As antibiotic prophylaxis, Ancef was ordered pre-procedure and administered intravenously within one hour of incision.  ANESTHESIA/SEDATION: Moderate sedation time: 30 minutes  FLUOROSCOPY TIME:  30 seconds  PROCEDURE: The risks of the procedure were explained to the patient. Informed consent was obtained. Patient was placed supine on the interventional table. Ultrasound confirmed a patent right internal jugular vein. The right chest and neck were cleaned with a skin antiseptic and a sterile drape was placed. Maximal barrier sterile technique was utilized including caps, mask, sterile gowns, sterile gloves, sterile drape, hand hygiene and skin antiseptic. The right neck was anesthetized with 1% lidocaine. Small incision was made in the right neck with a blade. Micropuncture set was placed in the right internal jugular vein with ultrasound guidance. The micropuncture wire was used for measurement purposes. The right chest was anesthetized with 1% lidocaine with epinephrine. #15 blade was used to make an  incision and a subcutaneous port pocket was formed. 8 french Power Port was assembled. Subcutaneous tunnel was formed with a stiff tunneling device. The port catheter was brought through the subcutaneous tunnel. The port was placed in the subcutaneous pocket. The micropuncture set was exchanged for a peel-away sheath. The catheter was placed through the peel-away sheath and the tip was positioned in the superior vena cava. Catheter placement was confirmed with fluoroscopy. The port was accessed and flushed with heparinized saline. The port pocket was closed using two layers of absorbable sutures and Dermabond. The vein skin site was closed using a single layer of absorbable suture and Dermabond. Sterile dressings were applied. Patient tolerated the procedure well without an immediate complication. Ultrasound and fluoroscopic images were taken and saved for this procedure.  COMPLICATIONS: None  IMPRESSION: Placement of a subcutaneous port device. The catheter tip is in the superior vena cava and ready to be used.   Electronically Signed   By: Richarda Overlie M.D.   On: 08/22/2013 15:50   Ir US Guide Vasc Access Left  09/15/2013   CLINICAL DATA:  Multiple sclerosis, irritable bowel syndrome, poor venous access.  EXAM: MIDLINE CATHETER PLACEMENT WITH ULTRASOUND AND FLUOROSCOPY  TECHNIQUE: After written informed consent was obtained, patient was placed in the supine position on angiographic table. Left side approach was selected because of an indwelling right port catheter. Patency of the left basilic vein was confirmed with ultrasound with image documentation. An appropriate skin site was determined. Skin site was marked. Region was prepped using maximum barrier technique including cap and mask, sterile gown, sterile gloves, large sterile sheet, and Chlorhexidine as cutaneous antisepsis. The region was infiltrated locally with 1% lidocaine. Under real-time ultrasound guidance, the left basilic vein was accessed with a  21 gauge micropuncture needle; the needle tip within the vein was confirmed with ultrasound image documentation. Needle exchanged over a 018 guidewire for a  peel-away sheath. The guidewire would not advance all way to the SVC, consistently diverted at the level of the left IJ vein. 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 A midline catheter placement was therefore performed. Through the peel-away sheath a 5-French double-lumen power injectable PICC trimmed to 27cm was advanced, positioned with its tip in the central aspect of the left subclavian vein. Spot chest radiograph confirms appropriate catheter position. Catheter was flushed per protocol and secured externally with 0-Prolene sutures. The patient tolerated procedure well, with no immediate complication.  FLUOROSCOPY TIME:  30 seconds  IMPRESSION: 1. Long segment left innominate vein occlusion. 2. Technically successful five [place] double lumen power injectable left arm midline catheter placement   Electronically Signed   By: Oley Balm M.D.   On: 09/15/2013 10:39   Ir US Guide Vasc Access Right  08/22/2013   CLINICAL DATA:  47 year old with esophageal cancer.  EXAM: FLUOROSCOPIC AND ULTRASOUND GUIDED PLACEMENT OF A SUBCUTANEOUS PORT.  Physician: Rachelle Hora. Lowella Dandy, MD  MEDICATIONS AND MEDICAL HISTORY: Versed 4 mg, fentanyl 200 mcg. Ancef 2 g. Zofran 4 mg. A radiology nurse monitored the patient for moderate sedation. As antibiotic prophylaxis, Ancef was ordered pre-procedure and administered intravenously within one hour of incision.  ANESTHESIA/SEDATION: Moderate sedation time: 30 minutes  FLUOROSCOPY TIME:  30 seconds  PROCEDURE: The risks of the procedure were explained to the patient. Informed consent was obtained. Patient was placed supine on the interventional table. Ultrasound confirmed a patent right internal jugular vein. The right chest and neck  were cleaned with a skin antiseptic and a sterile drape was placed. Maximal barrier sterile technique was utilized including caps, mask, sterile gowns, sterile gloves, sterile drape, hand hygiene and skin antiseptic. The right neck was anesthetized with 1% lidocaine. Small incision was made in the right neck with a blade. Micropuncture set was placed in the right internal jugular vein with ultrasound guidance. The micropuncture wire was used for measurement purposes. The right chest was anesthetized with 1% lidocaine with epinephrine. #15 blade was used to make an incision and a subcutaneous port pocket was formed. 8 french Power Port was assembled. Subcutaneous tunnel was formed with a stiff tunneling device. The port catheter was brought through the subcutaneous tunnel. The port was placed in the subcutaneous pocket. The micropuncture set was exchanged for a peel-away sheath. The catheter was placed through the peel-away sheath and the tip was positioned in the superior vena cava. Catheter placement was confirmed with fluoroscopy. The port was accessed and flushed with heparinized saline. The port pocket was closed using two layers of absorbable sutures and Dermabond. The vein skin site was closed using a single layer of absorbable suture and Dermabond. Sterile dressings were applied. Patient tolerated the procedure well without an immediate complication. Ultrasound and fluoroscopic images were taken and saved for this procedure.  COMPLICATIONS: None  IMPRESSION: Placement of a subcutaneous port device. The catheter tip is in the superior vena cava and ready to be used.   Electronically Signed   By: Richarda Overlie M.D.   On: 08/22/2013 15:50   Dg Chest Port 1 View  09/19/2013   CLINICAL DATA:  Chest congestion on auscultation.  EXAM: PORTABLE CHEST - 1 VIEW  COMPARISON:  09/12/2013.  FINDINGS: Normal sized heart. Interval small amount of linear and ill-defined density at the left lung base. The remainder of the  lungs are clear. Stable right jugular port catheter. Interval left  PICC with its tip in the region of the proximal left subclavian vein. Unremarkable bones. Cholecystectomy clips.  IMPRESSION: 1. Interval minimal left basilar atelectasis and possible pneumonia. 2. Left PICC tip in the proximal left subclavian vein.  If   Electronically Signed   By: Gordan Payment M.D.   On: 09/19/2013 00:21    Microbiology: Recent Results (from the past 240 hour(s))  SURGICAL PCR SCREEN     Status: None   Collection Time    09/17/13  7:17 AM      Result Value Range Status   MRSA, PCR NEGATIVE  NEGATIVE Final   Staphylococcus aureus NEGATIVE  NEGATIVE Final   Comment:            The Xpert SA Assay (FDA     approved for NASAL specimens     in patients over 58 years of age),     is one component of     a comprehensive surveillance     program.  Test performance has     been validated by [employer] for patients greater     than or equal to 79 year old.     It is not intended     to diagnose infection nor to     guide or monitor treatment.     Labs: Basic Metabolic Panel:  Recent Labs Lab 09/13/13 0400 09/14/13 0455 09/15/13 0540 09/16/13 0535 09/17/13 0910 09/18/13 0600 09/19/13 0515  NA 133* 136 133* 136 135 129* 134*  K 2.9* 3.7 3.2* 3.1* 3.8 4.0 3.7  CL 95* 98 97 97 97 92* 97  CO2 24 24 26 27 27 24 26   GLUCOSE 51* 64* 122* 87 107* 133* 121*  BUN <3* <3* <3* 5* 5* 10 9  CREATININE 0.40* 0.36* 0.37* 0.45* 0.45* 0.39* 0.35*  CALCIUM 7.8* 8.1* 8.7 8.8 8.7 8.6 8.1*  MG 1.7 2.1 2.1  --  2.0 1.9  --   PHOS 2.4 2.1* 3.2  --  4.1 3.5  --    Liver Function Tests:  Recent Labs Lab 09/12/13 1305 09/13/13 0400 09/14/13 0455 09/15/13 0540 09/18/13 0600  AST 52* 40* 32 26 18  ALT 21 17 13 15 10   ALKPHOS 133* 123* 123* 133* 102  BILITOT 0.3 0.3 0.3 0.2* 0.3  PROT 6.2 5.5* 5.6* 6.2 6.2  ALBUMIN 2.4* 2.3* 2.4* 2.5* 2.2*    Recent Labs Lab 09/12/13 1305  LIPASE 22   CBC:  Recent  Labs Lab 09/12/13 1305 09/13/13 0400 09/14/13 0455 09/15/13 0540  WBC 7.2 5.1 4.8 5.7  NEUTROABS 5.8  --  3.4 4.1  HGB 14.0 13.1 13.1 14.4  HCT 40.1 37.2 37.6 40.5  MCV 90.9 90.7 91.5 91.2  PLT 386 355 349 326    BNP (last 3 results)  Recent Labs  07/29/13 2104  PROBNP 74.9   CBG:  Recent Labs Lab 09/18/13 1204 09/18/13 2007 09/18/13 2337 09/19/13 0436 09/19/13 0803  GLUCAP 162* 123* 137* 135* 137*       Signed:  Dorinda Hill, PA-S  [facility] 09/19/2013, 10:41 AM

## 2013-09-19 NOTE — Progress Notes (Addendum)
NUTRITION FOLLOW-UP  DOCUMENTATION CODES Per approved criteria  -Severe malnutrition in the context of chronic illness   INTERVENTION: Continue advancement of Osmolite 1.2 via j-tube by 10 ml q 12 hours to goal of 60 ml/hr. This goal rate will provide: 1728 kcal, 80 grams protein, 1181 ml free water. [name] requires slow advancement 2/2 refeeding risk. Agree with d/c of TPN today. Once IVF discontinued, recommend free water flushes of 160 ml QID. This will provide an additional 640 ml free water daily. Monitor magnesium, potassium, and phosphorus daily throughout advancement of nutrition support, MD or PharmD to replete as needed, as [name] is at risk for refeeding syndrome given severe malnutrition.  Recommend continued follow-up with [facility] RD for further management of feedings. RD to continue to follow nutrition care plan.  NUTRITION DIAGNOSIS: Malnutrition related to cancer as evidenced by 16% weight loss x 1 month, intake of </= 75% of needs for >/= 1 month, and severe fat and muscle wasting. Ongoing.  Goal: [name] to meet >/= 90% of their estimated nutrition needs - ongoing.  Monitor:  TF initiation and tolerance, weight trend, labs  ASSESSMENT: PMHx of metastatic adenocarcinoma of distal esophagus. [name] was planned to have PEG placed on 11/12 however [name] was admitted to hospital 2/2 inability to swallow given her esophageal obstruction.  TPN to be discontinued today at 1800 per PharmD note.  [name] underwent j-tube placement 11/13 (was unable to get G-tube.) Currently receiving and tolerating Osmolite 1.2 at 40 ml/hr. This provides 1152 kcal, 54 grams protein, 787 ml free water. RN reports that [name] is tolerating well. [name] with  Intermittent vomiting, per oncology MD.  [name] now with possible hospital PNA, per chart.  Nutrition support labs reviewed: Potassium, Magnesium and Phosphorus are currently WNL Sodium low at 134 but trending up Blood sugars ranging from 135 - 137 Prealbumin is low at  12.8 Triglycerides elevated at 233  [name] also ordered for Clear Liquids at this time for comfort. Intake is minimal.  Height: Ht Readings from Last 1 Encounters:  09/12/13 5\' 3"  (1.6 m)    Weight: Wt Readings from Last 1 Encounters:  09/19/13 116 lb 10 oz (52.9 kg)  Admit wt 108 lb - increased likely 2/2 fluid balance of +9.9 liters at this time  BMI:  Body mass index is 20.66 kg/(m^2). Weight is WNL.  Estimated Nutritional Needs: Kcal: 1600-1800 Protein: 80-100 Fluid: > 1.8 L/day  Skin: abdomen incision  Diet Order: Clear Liquid  EDUCATION NEEDS: -No education needs identified at this time   Intake/Output Summary (Last 24 hours) at 09/19/13 1038 Last data filed at 09/19/13 0840  Gross per 24 hour  Intake   5051 ml  Output   1000 ml  Net   4051 ml    Last BM: 11/11  Labs:   Recent Labs Lab 09/15/13 0540  09/17/13 0910 09/18/13 0600 09/19/13 0515  NA 133*  < > 135 129* 134*  K 3.2*  < > 3.8 4.0 3.7  CL 97  < > 97 92* 97  CO2 26  < > 27 24 26   BUN <3*  < > 5* 10 9  CREATININE 0.37*  < > 0.45* 0.39* 0.35*  CALCIUM 8.7  < > 8.7 8.6 8.1*  MG 2.1  --  2.0 1.9  --   PHOS 3.2  --  4.1 3.5  --   GLUCOSE 122*  < > 107* 133* 121*  < > = values in this interval not displayed. CBG (  last 3)   Recent Labs  09/18/13 2337 09/19/13 0436 09/19/13 0803  GLUCAP 137* 135* 137*   Prealbumin  Date/Time Value Range Status  09/15/2013  5:40 AM 12.8* 17.0 - 34.0 mg/dL Final     Performed at [facility]   Triglycerides  Date/Time Value Range Status  09/15/2013  5:40 AM 233* <150 mg/dL Final     Scheduled Meds: . ceFEPime (MAXIPIME) IV  1 g Intravenous Q8H  . enoxaparin (LOVENOX) injection  40 mg Subcutaneous Q24H  . fentaNYL  25 mcg Transdermal Q72H  . furosemide  40 mg Intravenous Once  . lactulose  10 g Per Tube BID  . levothyroxine  125 mcg Per Tube Daily  . methadone  30 mg Per Tube Q8H  . nicotine  21 mg Transdermal Daily  . pantoprazole sodium   40 mg Per Tube q1800  . potassium chloride  40 mEq Per Tube Once  . sodium chloride  3 mL Intravenous Q12H  . vancomycin  500 mg Intravenous Q12H    Continuous Infusions: . feeding supplement (OSMOLITE 1.2 CAL) 1,000 mL (09/19/13 0123)  . Marland KitchenTPN (CLINIMIX-E) Adult 30 mL/hr at 09/18/13 [address] [place], [place], [place] Pager: [phone] After-hours pager: [phone]

## 2013-09-19 NOTE — Progress Notes (Signed)
Chest xray showed possible pneumonia. Results called to Claiborne Billings, NP.  Blood cultures and antibiotics ordered. Will continue to monitor.

## 2013-09-19 NOTE — Progress Notes (Addendum)
Wasted of IV fentanyl in the sink, witnessed by Hermelinda Medicus, RN. Julien Nordmann [facility]

## 2013-09-19 NOTE — Telephone Encounter (Signed)
Not able to reach pt re 11/21 appts. Schedule mailed. Message to [facility] to see if you could possible see pt 11/21 when she comes in to see LL 11/21.

## 2013-09-19 NOTE — Progress Notes (Addendum)
PARENTERAL NUTRITION CONSULT NOTE - FOLLOW UP  Pharmacy Consult for TNA Indication: dysphagia, severe malnutrition related to esophageal cancer  Allergies  Allergen Reactions  . Erythromycin Nausea And Vomiting  . Morphine And Related Other (See Comments)    whelps on arms at iv site   Patient Measurements: Height: 5\' 3"  (160 cm) Weight: 116 lb 10 oz (52.9 kg) IBW/kg (Calculated) : 52.4  Vital Signs: Temp: 98.5 F (36.9 C) (11/14 0434) Temp src: Oral (11/14 0434) BP: 121/75 mmHg (11/14 0434) Pulse Rate: 99 (11/13 2350) Intake/Output from previous day: 11/13 0701 - 11/14 0700 In: 5091 [I.V.:898.3; NG/GT:540.2; TPN:3542.5] Out: 350 [Urine:350] Intake/Output from this shift:    Labs: No results found for this basename: WBC, HGB, HCT, PLT, APTT, INR,  in the last 72 hours   Recent Labs  09/17/13 0910 09/18/13 0600 09/19/13 0515  NA 135 129* 134*  K 3.8 4.0 3.7  CL 97 92* 97  CO2 27 24 26   GLUCOSE 107* 133* 121*  BUN 5* 10 9  CREATININE 0.45* 0.39* 0.35*  CALCIUM 8.7 8.6 8.1*  MG 2.0 1.9  --   PHOS 4.1 3.5  --   PROT  --  6.2  --   ALBUMIN  --  2.2*  --   AST  --  18  --   ALT  --  10  --   ALKPHOS  --  102  --   BILITOT  --  0.3  --    Estimated Creatinine Clearance: 72.7 ml/min (by C-G formula based on Cr of 0.35).    Recent Labs  09/18/13 2007 09/18/13 2337 09/19/13 0436  GLUCAP 123* 137* 135*    Insulin Requirements in the past 24 hours:   SSI dc'd 11/11;  serum 121  Current Nutrition:  Clinimix E 5/15 at 46mL/hr +TF: Osmolite 1.2 at 40 ml/hr (goal 60 ml/hr) Anticipate TF will be increased to 50 ml/hr at 10 am and 60 ml/hr at 10 pm Clear liquid diet for enjoyment/comfort- poor appetite documented.  Nutritional Goals:  1600-1800 kCal, 80-100 grams of protein per day per RD assessment 11/8  Assessment: 21 YOF with h/o primary distal esophagus/GE junction with met involvement who is severely malnourished and has been unable to eat for the past  2 days. She is scheduled for PEG placement 11/12.  Unable to place PEG due to tumor.  Feeding jejunostomy tube placed instead.   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 For that reason I placed a feeding jejunostomy tube approximately 10 inches distal to the ligament of Treitz" per Dr. Derrell Lolling. Pt tolerating TF (Osmolite 1.2) at 40 ml/hr - will be increased to 50 ml/hr at 10 am and a 60 ml/hr at 10pm.   Endo: hx Graves' disease- home dose Synthroid PO- here is being started on Synthroid IV d/t elevated TSH. I changed to 125 per tube 11/14.  Lytes: Lytes wnl except for Na up to 134 from 129.   Renal: SCr stable.  Hepatobil: albumin low at 2.2, other lytes wnl. Prealbumin 11.7 -low.  Neuro: methadone 30mg  PO q8h  ID/Onc: has not yet started FOLFOX chemo treatment; afebrile. Vanc/cefepime started 11/14 for PNA on CXR.  Best Practices: subq hep  TPN Access: Port-A-Cath  TPN day#: 6  (ordered 11/8 but did not start until 11/9 due to miscommunication between RN, MD, and RPh)  Plan:  1. Discontinue  Clinimix E 5/15 today at 1800 when this bag expires.  TF will be at goal at 2200 tonight and at 83% of goal rate at 10am today 3. Change IV PPI to per tube 4. Change IV synthroid to per tube at dose of 125 which is higher than previous home dose of 100 mcg 2nd elevated TSH on 11/11. 5. Dc TPN labs 6. Further nutrition f/u per RD, pharmacy to s/o TPN monitoring  Herby Abraham, Pharm.D. [phone] 09/19/2013 7:48 AM

## 2013-09-20 LAB — BASIC METABOLIC PANEL
BUN: 12 mg/dL (ref 6–23)
CO2: 27 mEq/L (ref 19–32)
Chloride: 95 mEq/L — ABNORMAL LOW (ref 96–112)
GFR calc non Af Amer: >90 mL/min (ref >90)
Glucose, Bld: 109 mg/dL — ABNORMAL HIGH (ref 70–99)
Potassium: 3.6 mEq/L (ref 3.5–5.1)

## 2013-09-20 LAB — LEGIONELLA ANTIGEN, URINE

## 2013-09-20 LAB — CBC
Hemoglobin: 11.8 g/dL — ABNORMAL LOW (ref 12.0–15.0)
MCH: 32.3 pg (ref 26.0–34.0)
MCHC: 35 g/dL (ref 30.0–36.0)
MCV: 92.3 fL (ref 78.0–100.0)
RBC: 3.65 MIL/uL — ABNORMAL LOW (ref 3.87–5.11)
RDW: 14.8 % (ref 11.5–15.5)

## 2013-09-20 LAB — GLUCOSE, CAPILLARY: Glucose-Capillary: 133 mg/dL — ABNORMAL HIGH (ref 70–99)

## 2013-09-20 MED ORDER — OXYCODONE HCL 5 MG/5ML PO SOLN: 10.0000 mg | ORAL | Status: DC | PRN

## 2013-09-20 MED ORDER — HYDROMORPHONE HCL 2 MG PO TABS
4.0000 mg | ORAL_TABLET | ORAL | Status: DC | PRN
  Administered 2013-09-20 (×2): 6 mg
  Filled 2013-09-20 (×2): qty 3

## 2013-09-20 MED ORDER — METHADONE HCL 10 MG/5ML PO SOLN: 30.0000 mg | Freq: Three times a day (TID) | ORAL | Status: DC

## 2013-09-20 MED ORDER — LEVOFLOXACIN 750 MG PO TABS: 750.0000 mg | ORAL_TABLET | Freq: Every day | ORAL | Status: AC

## 2013-09-20 MED ORDER — LACTULOSE 10 GM/15ML PO SOLN: 10.0000 g | Freq: Two times a day (BID) | ORAL | Status: DC

## 2013-09-20 NOTE — Progress Notes (Signed)
Education provided on how to give medications through PEG site. Pt demonstrates an understanding and ability to perform task with minimal assistance. Pt may benefit from additional help at home until she is more comfortable with performing this task. She states she will be staying with her mother upon discharge, for a while, and mother will be in today to also receive education. Will continue to monitor and assist as needed. Julien Nordmann [facility]

## 2013-09-20 NOTE — Progress Notes (Signed)
Influenza PCR results are negative, Droplet precautions order discontinued per protocol. Julien Nordmann [facility]

## 2013-09-20 NOTE — Progress Notes (Signed)
Residual check equal zero tube feeding increased to 60cc/hr. Will continue to monitor and assist as needed. Julien Nordmann [facility]

## 2013-09-20 NOTE — Progress Notes (Signed)
Pt given discharge instructions and prescriptions.  PICC line flushed and port de-accessed.  Pt taken down to drop off location via wheelchair.

## 2013-09-20 NOTE — Discharge Summary (Signed)
Physician Discharge Summary  [name]:[id number] DOB: July 05, 1966 DOA: 09/12/2013  PCP: [name], MD  Admit date: 09/12/2013 Discharge date: 09/20/2013  Time spent: 40 minutes minutes  Recommendations for Outpatient Follow-up:  1. Followup with primary care physician within one week. 2. Followup with Dr. Melinda Crutch next week. 3. Recheck TSH level in 4-6 weeks.  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 female with a past medical history metastatic adenocarcinoma of distal esophagus who currently follows with Dr. Jama Flavors of her medical oncology at the [facility]. She was recently seen by Dr. Darrold Span on 09/10/2013 at which time G-tube placement was discussed. The patient has progressively developed an inability to swallow adequately do to the esophageal obstruction caused by metastatic esophageal cancer. She was scheduled to undergo G tube placement by Dr. Derrell Lolling of general surgery next week on Wednesday. In the meantime has been receiving IV fluids at home through home health services. Up to this point she was able to hold down protein boost and small amounts of other liquids. However in the last 24-48 hours if she has been unable to hold anything down stating that all liquids have come back up. She denies fevers chills chest pain shortness of breath diarrhea dysuria hematuria. She is not presently on antimicrobial therapy. She does complain of chronic abdominal pain located in the epigastric and left upper quadrant region appear.   Hospital  Course:   1. Distal esophageal adenocarcinoma: Involving the GE junction with bony metastases and carcinomatosis. Patient presented to the hospital because of progressive dysphagia, she was sent from her oncologist office for further evaluation. Neurosurgery was consulted for placement of G-tube, patient went to the OR on 09/17/2013, according to Dr. Jacinto Halim note the patient has carcinomatosis unable to place G-tube secondary to stomach fixation secondary to tumor burden, J-tube was placed instead. A J-tube was placed and tube feeding was started, patient tolerated that very well, medication was switched to liquid form. Oncology evaluated patient in the hospital, patient will continue palliative chemotherapy. Other options include palliative/hospice care, patient to discuss with primary oncologist.  2. Severe pain secondary to cancer: Patient has bony metastasis involving her vertebral:, She does have carcinomatosis seen on the OR. Patient was on methadone for narcotics detoxification, now methadone is used for metastatic cancer pain, patient is on 90 mg now 30 mg 3 times a day. Increase her OxyIR to 10 mg every 4 hours as needed for pain. Pain medication regimen should be revised with oncology next Friday.  3. Pneumonia: Questionable pneumonia. Patient did have shortness of breath and cough just after the 2 feeding was started. Questionable aspiration versus healthcare associated pneumonia, chest x-ray was concerning for pneumonia. Patient started on cefepime and vancomycin and switched to Levaquin on the day of discharge.  4. History of recurrent pancreatitis: Patient has sphincter of Oddi dysfunction, no acute issues this time lipase is stable.  5. History of Graves' disease: Status post-induced hypothyroidism, TSH was in the high side, Synthroid continue to 150  mcg. Recommend to check TSH in 4-6 weeks.  6. Severe malnutrition: Settings of the chest metastatic adenocarcinoma of the esophagus,  constipation unable to eat, she is on clear liquids. She was on TPN for brief period of time, J-tube was placed and tube feedings started.  7. Prolonged QT on EKG: Mildly prolonged QT on EKG, no dysrhythmia on telemetry. Continued telemetry monitoring while she is in the hospital. IV magnesium was given and electrolytes monitored closely.  Procedures:  Placement of percutaneous jejunal tube placed by Dr. Derrell Lolling on 09/17/2013 and  Consultations:  General surgery.  Hematology oncology.  Discharge Exam: Filed Vitals:   09/20/13 0542  BP: 129/85  Pulse: 98  Temp: 98.3 F (36.8 C)  Resp: 18   General: Alert and awake, oriented x3, not in any acute distress. HEENT: anicteric sclera, pupils reactive to light and accommodation, EOMI CVS: S1-S2 clear, no murmur rubs or gallops Chest: clear to auscultation bilaterally, no wheezing, rales or rhonchi Abdomen: soft nontender, nondistended, normal bowel sounds, no organomegaly Extremities: no cyanosis, clubbing or edema noted bilaterally Neuro: Cranial nerves II-XII intact, no focal neurological deficits  Discharge Instructions  Discharge Orders   Future Appointments Provider Department Dept Phone   09/26/2013 1:00 PM Mauri [facility] [phone]   09/26/2013 1:15 PM [facility] Nurse [place] [facility] [phone]   09/26/2013 1:45 PM Reece Packer, MD [place] [facility] [phone]   10/22/2013 9:20 AM Maryln Gottron, MD [place] [facility] RADIATION ONCOL[facility] [phone]   Future Orders Complete By Expires   Call MD for:  redness, tenderness, or signs of infection (pain, swelling, redness, odor or green/yellow discharge around incision site)  As directed    Call MD for:  severe uncontrolled pain  As directed    Driving Restrictions  As directed    Comments:     Do not drive while on pain medications or other sedating  medications.   Increase activity slowly  As directed    Other Restrictions  As directed    Comments:     J tube for feedings       Medication List         busPIRone 15 MG tablet  Commonly known as:  BUSPAR  Take 15 mg by mouth 2 (two) times daily.     escitalopram 10 MG tablet  Commonly known as:  LEXAPRO  Take 30 mg by mouth daily after supper.     esomeprazole 20 MG capsule  Commonly known as:  NEXIUM  Take 20 mg by mouth daily before breakfast.     imipramine 50 MG tablet  Commonly known as:  TOFRANIL  Take 100 mg by mouth at bedtime.     lactulose 10 GM/15ML solution  Commonly known as:  CHRONULAC  Place 15 mLs (10 g total) into feeding tube 2 (two) times daily.     levofloxacin 750 MG tablet  Commonly known as:  LEVAQUIN  Take 1 tablet (750 mg total) by mouth daily.     levothyroxine 100 MCG tablet  Commonly known as:  SYNTHROID, LEVOTHROID  Take 100 mcg by mouth daily before breakfast.     lidocaine-prilocaine cream  Commonly known as:  EMLA  Apply 1-2 hours prior to Porta-Cath accessed     magic mouthwash w/lidocaine Soln  1part nystatin,1part Maaloxplus,1part benadryl,3part 2%viscous lidocaine. swallow 10 mL up to QID, before meals/bedtime     Melatonin 3  MG Tabs  Take 3-6 mg by mouth at bedtime as needed (For sleep.).     methadone 10 MG/5ML solution  Commonly known as:  DOLOPHINE  15 mLs (30 mg total) by Per J Tube route every 8 (eight) hours.     multivitamin with minerals Tabs tablet  Take 1 tablet by mouth at bedtime.     nicotine 14 mg/24hr patch  Commonly known as:  NICODERM CQ - dosed in mg/24 hours  Place 1 patch onto the skin daily.     ondansetron 8 MG tablet  Commonly known as:  ZOFRAN  Take 8 mg by mouth every 8 (eight) hours as needed for nausea.     oxyCODONE 5 MG/5ML solution  Commonly known as:  ROXICODONE  Place 10 mLs (10 mg total) into feeding tube every 4 (four) hours as needed for moderate pain.     potassium  chloride 10 MEQ tablet  Commonly known as:  K-DUR,KLOR-CON  Take 10 mEq by mouth 2 (two) times daily.       Allergies  Allergen Reactions  . Erythromycin Nausea And Vomiting  . Morphine And Related Other (See Comments)    whelps on arms at iv site       Follow-up Information   Follow up with Reece Packer, MD. Schedule an appointment as soon as possible for a visit on 09/26/2013.   Specialty:  Oncology   Contact information:   [address] [place] [address] [phone]       Please follow up. (@ 1:00 PM)        The results of significant diagnostics from this hospitalization (including imaging, microbiology, ancillary and laboratory) are listed below for reference.    Significant Diagnostic Studies: Dg Chest 2 View  09/12/2013   CLINICAL DATA:  Body pain, cough  EXAM: CHEST  2 VIEW  COMPARISON:  CT chest 07/31/2013  FINDINGS: Clear lungs. Normal heart and mediastinum. Unremarkable osseous structures. Right-sided Port-A-Cath with the tip projecting over the proximal IVC.  IMPRESSION: No active cardiopulmonary disease.   Electronically Signed   By: [name]   On: 09/12/2013 12:02   US Renal  09/16/2013   CLINICAL DATA:  History of left flank pain.  EXAM: RENAL/URINARY TRACT ULTRASOUND COMPLETE  COMPARISON:  09/11/2012 ultrasound. CT 07/31/2013.  FINDINGS: Right Kidney  Length: Right renal length is 10.5 cm. Echogenicity within normal limits. No mass, calculus, parenchymal loss, or hydronephrosis visualized.  Left Kidney  Length: Left renal length is 11.3 cm. Echogenicity within normal limits. No mass, calculus, parenchymal loss, or hydronephrosis visualized.  Bladder  Appears normal for degree of bladder distention.  IMPRESSION: Normal ultrasonic appearance of the kidneys. No evidence of hydronephrosis. No bladder lesion identified.   Electronically Signed   By: Onalee Hua  Call M.D.   On: 09/16/2013 14:26   Ir Fluoro Guide Cv Line Left  09/15/2013   CLINICAL DATA:   Multiple sclerosis, irritable bowel syndrome, poor venous access.  EXAM: MIDLINE CATHETER PLACEMENT WITH ULTRASOUND AND FLUOROSCOPY  TECHNIQUE: After written informed consent was obtained, patient was placed in the supine position on angiographic table. Left side approach was selected because of an indwelling right port catheter. Patency of the left basilic vein was confirmed with ultrasound with image documentation. An appropriate skin site was determined. Skin site was marked. Region was prepped using maximum barrier technique including cap and mask, sterile gown, sterile gloves, large sterile sheet, and Chlorhexidine as cutaneous antisepsis. The region was infiltrated locally with  1% lidocaine. Under real-time ultrasound guidance, the left basilic vein was accessed with a 21 gauge micropuncture needle; the needle tip within the vein was confirmed with ultrasound image documentation. Needle exchanged over a 018 guidewire for a peel-away sheath. The guidewire would not advance all way to the SVC, consistently diverted at the level of the left IJ vein. 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 A midline catheter placement was therefore performed. Through the peel-away sheath a 5-French double-lumen power injectable PICC trimmed to 27cm was advanced, positioned with its tip in the central aspect of the left subclavian vein. Spot chest radiograph confirms appropriate catheter position. Catheter was flushed per protocol and secured externally with 0-Prolene sutures. The patient tolerated procedure well, with no immediate complication.  FLUOROSCOPY TIME:  30 seconds  IMPRESSION: 1. Long segment left innominate vein occlusion. 2. Technically successful five [place] double lumen power injectable left arm midline catheter placement   Electronically Signed   By: Oley Balm M.D.   On: 09/15/2013 10:39   Ir Fluoro Guide  Cv Line Right  08/22/2013   CLINICAL DATA:  47 year old with esophageal cancer.  EXAM: FLUOROSCOPIC AND ULTRASOUND GUIDED PLACEMENT OF A SUBCUTANEOUS PORT.  Physician: Rachelle Hora. Lowella Dandy, MD  MEDICATIONS AND MEDICAL HISTORY: Versed 4 mg, fentanyl 200 mcg. Ancef 2 g. Zofran 4 mg. A radiology nurse monitored the patient for moderate sedation. As antibiotic prophylaxis, Ancef was ordered pre-procedure and administered intravenously within one hour of incision.  ANESTHESIA/SEDATION: Moderate sedation time: 30 minutes  FLUOROSCOPY TIME:  30 seconds  PROCEDURE: The risks of the procedure were explained to the patient. Informed consent was obtained. Patient was placed supine on the interventional table. Ultrasound confirmed a patent right internal jugular vein. The right chest and neck were cleaned with a skin antiseptic and a sterile drape was placed. Maximal barrier sterile technique was utilized including caps, mask, sterile gowns, sterile gloves, sterile drape, hand hygiene and skin antiseptic. The right neck was anesthetized with 1% lidocaine. Small incision was made in the right neck with a blade. Micropuncture set was placed in the right internal jugular vein with ultrasound guidance. The micropuncture wire was used for measurement purposes. The right chest was anesthetized with 1% lidocaine with epinephrine. #15 blade was used to make an incision and a subcutaneous port pocket was formed. 8 french Power Port was assembled. Subcutaneous tunnel was formed with a stiff tunneling device. The port catheter was brought through the subcutaneous tunnel. The port was placed in the subcutaneous pocket. The micropuncture set was exchanged for a peel-away sheath. The catheter was placed through the peel-away sheath and the tip was positioned in the superior vena cava. Catheter placement was confirmed with fluoroscopy. The port was accessed and flushed with heparinized saline. The port pocket was closed using two layers of  absorbable sutures and Dermabond. The vein skin site was closed using a single layer of absorbable suture and Dermabond. Sterile dressings were applied. Patient tolerated the procedure well without an immediate complication. Ultrasound and fluoroscopic images were taken and saved for this procedure.  COMPLICATIONS: None  IMPRESSION: Placement of a subcutaneous port device. The catheter tip is in the superior vena cava and ready to be used.   Electronically Signed   By: Richarda Overlie M.D.   On: 08/22/2013 15:50   Ir US Guide Vasc Access Left  09/15/2013   CLINICAL DATA:  Multiple sclerosis, irritable bowel syndrome, poor venous access.  EXAM: MIDLINE CATHETER PLACEMENT WITH ULTRASOUND AND FLUOROSCOPY  TECHNIQUE: After written informed consent was obtained, patient was placed in the supine position on angiographic table. Left side approach was selected because of an indwelling right port catheter. Patency of the left basilic vein was confirmed with ultrasound with image documentation. An appropriate skin site was determined. Skin site was marked. Region was prepped using maximum barrier technique including cap and mask, sterile gown, sterile gloves, large sterile sheet, and Chlorhexidine as cutaneous antisepsis. The region was infiltrated locally with 1% lidocaine. Under real-time ultrasound guidance, the left basilic vein was accessed with a 21 gauge micropuncture needle; the needle tip within the vein was confirmed with ultrasound image documentation. Needle exchanged over a 018 guidewire for a peel-away sheath. The guidewire would not advance all way to the SVC, consistently diverted at the level of the left IJ vein. 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 A midline catheter placement was therefore performed. Through the peel-away sheath a 5-French double-lumen power injectable PICC trimmed to 27cm was advanced,  positioned with its tip in the central aspect of the left subclavian vein. Spot chest radiograph confirms appropriate catheter position. Catheter was flushed per protocol and secured externally with 0-Prolene sutures. The patient tolerated procedure well, with no immediate complication.  FLUOROSCOPY TIME:  30 seconds  IMPRESSION: 1. Long segment left innominate vein occlusion. 2. Technically successful five [place] double lumen power injectable left arm midline catheter placement   Electronically Signed   By: Oley Balm M.D.   On: 09/15/2013 10:39   Ir US Guide Vasc Access Right  08/22/2013   CLINICAL DATA:  47 year old with esophageal cancer.  EXAM: FLUOROSCOPIC AND ULTRASOUND GUIDED PLACEMENT OF A SUBCUTANEOUS PORT.  Physician: Rachelle Hora. Lowella Dandy, MD  MEDICATIONS AND MEDICAL HISTORY: Versed 4 mg, fentanyl 200 mcg. Ancef 2 g. Zofran 4 mg. A radiology nurse monitored the patient for moderate sedation. As antibiotic prophylaxis, Ancef was ordered pre-procedure and administered intravenously within one hour of incision.  ANESTHESIA/SEDATION: Moderate sedation time: 30 minutes  FLUOROSCOPY TIME:  30 seconds  PROCEDURE: The risks of the procedure were explained to the patient. Informed consent was obtained. Patient was placed supine on the interventional table. Ultrasound confirmed a patent right internal jugular vein. The right chest and neck were cleaned with a skin antiseptic and a sterile drape was placed. Maximal barrier sterile technique was utilized including caps, mask, sterile gowns, sterile gloves, sterile drape, hand hygiene and skin antiseptic. The right neck was anesthetized with 1% lidocaine. Small incision was made in the right neck with a blade. Micropuncture set was placed in the right internal jugular vein with ultrasound guidance. The micropuncture wire was used for measurement purposes. The right chest was anesthetized with 1% lidocaine with epinephrine. #15 blade was used to make an incision and a  subcutaneous port pocket was formed. 8 french Power Port was assembled. Subcutaneous tunnel was formed with a stiff tunneling device. The port catheter was brought through the subcutaneous tunnel. The port was placed in the subcutaneous pocket. The micropuncture set was exchanged for a peel-away sheath. The catheter was placed through the peel-away sheath and the tip was positioned in the superior vena cava. Catheter placement was confirmed with fluoroscopy. The port was accessed and flushed with heparinized saline. The port pocket was closed using two layers of absorbable sutures and Dermabond. The vein skin site was closed using a single layer of absorbable suture  and Dermabond. Sterile dressings were applied. Patient tolerated the procedure well without an immediate complication. Ultrasound and fluoroscopic images were taken and saved for this procedure.  COMPLICATIONS: None  IMPRESSION: Placement of a subcutaneous port device. The catheter tip is in the superior vena cava and ready to be used.   Electronically Signed   By: Richarda Overlie M.D.   On: 08/22/2013 15:50   Dg Chest Port 1 View  09/19/2013   CLINICAL DATA:  Chest congestion on auscultation.  EXAM: PORTABLE CHEST - 1 VIEW  COMPARISON:  09/12/2013.  FINDINGS: Normal sized heart. Interval small amount of linear and ill-defined density at the left lung base. The remainder of the lungs are clear. Stable right jugular port catheter. Interval left PICC with its tip in the region of the proximal left subclavian vein. Unremarkable bones. Cholecystectomy clips.  IMPRESSION: 1. Interval minimal left basilar atelectasis and possible pneumonia. 2. Left PICC tip in the proximal left subclavian vein.  If   Electronically Signed   By: Gordan Payment M.D.   On: 09/19/2013 00:21    Microbiology: Recent Results (from the past 240 hour(s))  SURGICAL PCR SCREEN     Status: None   Collection Time    09/17/13  7:17 AM      Result Value Range Status   MRSA, PCR NEGATIVE   NEGATIVE Final   Staphylococcus aureus NEGATIVE  NEGATIVE Final   Comment:            The Xpert SA Assay (FDA     approved for NASAL specimens     in patients over 64 years of age),     is one component of     a comprehensive surveillance     program.  Test performance has     been validated by [employer] for patients greater     than or equal to 39 year old.     It is not intended     to diagnose infection nor to     guide or monitor treatment.     Labs: Basic Metabolic Panel:  Recent Labs Lab 09/14/13 0455 09/15/13 0540 09/16/13 0535 09/17/13 0910 09/18/13 0600 09/19/13 0515 09/20/13 0510  NA 136 133* 136 135 129* 134* 131*  K 3.7 3.2* 3.1* 3.8 4.0 3.7 3.6  CL 98 97 97 97 92* 97 95*  CO2 24 26 27 27 24 26 27   GLUCOSE 64* 122* 87 107* 133* 121* 109*  BUN <3* <3* 5* 5* 10 9 12   CREATININE 0.36* 0.37* 0.45* 0.45* 0.39* 0.35* 0.28*  CALCIUM 8.1* 8.7 8.8 8.7 8.6 8.1* 8.1*  MG 2.1 2.1  --  2.0 1.9  --   --   PHOS 2.1* 3.2  --  4.1 3.5  --   --    Liver Function Tests:  Recent Labs Lab 09/14/13 0455 09/15/13 0540 09/18/13 0600  AST 32 26 18  ALT 13 15 10   ALKPHOS 123* 133* 102  BILITOT 0.3 0.2* 0.3  PROT 5.6* 6.2 6.2  ALBUMIN 2.4* 2.5* 2.2*   No results found for this basename: LIPASE, AMYLASE,  in the last 168 hours No results found for this basename: AMMONIA,  in the last 168 hours CBC:  Recent Labs Lab 09/14/13 0455 09/15/13 0540 09/20/13 0510  WBC 4.8 5.7 6.2  NEUTROABS 3.4 4.1  --   HGB 13.1 14.4 11.8*  HCT 37.6 40.5 33.7*  MCV 91.5 91.2 92.3  PLT 349 326  239   Cardiac Enzymes: No results found for this basename: CKTOTAL, CKMB, CKMBINDEX, TROPONINI,  in the last 168 hours BNP: BNP (last 3 results)  Recent Labs  07/29/13 2104  PROBNP 74.9   CBG:  Recent Labs Lab 09/19/13 0436 09/19/13 0803 09/19/13 1030 09/19/13 1219 09/20/13 0754  GLUCAP 135* 137* 117* 118* 133*       Signed:  Alexx Giambra A  [facility] 09/20/2013, 9:52 AM

## 2013-09-21 ENCOUNTER — Encounter (HOSPITAL_COMMUNITY): Payer: Self-pay | Admitting: Emergency Medicine

## 2013-09-21 ENCOUNTER — Other Ambulatory Visit: Payer: Self-pay | Admitting: Oncology

## 2013-09-21 ENCOUNTER — Emergency Department (HOSPITAL_COMMUNITY)
Admission: EM | Admit: 2013-09-21 | Discharge: 2013-09-21 | Disposition: A | Discharge status: Home / Self Care | Payer: Medicare Other | Attending: Emergency Medicine | Admitting: Emergency Medicine

## 2013-09-21 DIAGNOSIS — Z79899 Other long term (current) drug therapy: Secondary | ICD-10-CM | POA: Insufficient documentation

## 2013-09-21 DIAGNOSIS — F3289 Other specified depressive episodes: Secondary | ICD-10-CM | POA: Insufficient documentation

## 2013-09-21 DIAGNOSIS — R0602 Shortness of breath: Secondary | ICD-10-CM | POA: Insufficient documentation

## 2013-09-21 DIAGNOSIS — R1084 Generalized abdominal pain: Secondary | ICD-10-CM | POA: Insufficient documentation

## 2013-09-21 DIAGNOSIS — G8929 Other chronic pain: Secondary | ICD-10-CM | POA: Insufficient documentation

## 2013-09-21 DIAGNOSIS — Z8501 Personal history of malignant neoplasm of esophagus: Secondary | ICD-10-CM | POA: Insufficient documentation

## 2013-09-21 DIAGNOSIS — E039 Hypothyroidism, unspecified: Secondary | ICD-10-CM | POA: Insufficient documentation

## 2013-09-21 DIAGNOSIS — F329 Major depressive disorder, single episode, unspecified: Secondary | ICD-10-CM | POA: Insufficient documentation

## 2013-09-21 DIAGNOSIS — Z8679 Personal history of other diseases of the circulatory system: Secondary | ICD-10-CM | POA: Insufficient documentation

## 2013-09-21 DIAGNOSIS — F17200 Nicotine dependence, unspecified, uncomplicated: Secondary | ICD-10-CM | POA: Insufficient documentation

## 2013-09-21 DIAGNOSIS — M549 Dorsalgia, unspecified: Secondary | ICD-10-CM | POA: Insufficient documentation

## 2013-09-21 DIAGNOSIS — F411 Generalized anxiety disorder: Secondary | ICD-10-CM | POA: Insufficient documentation

## 2013-09-21 DIAGNOSIS — Z9889 Other specified postprocedural states: Secondary | ICD-10-CM | POA: Insufficient documentation

## 2013-09-21 DIAGNOSIS — K589 Irritable bowel syndrome without diarrhea: Secondary | ICD-10-CM | POA: Insufficient documentation

## 2013-09-21 DIAGNOSIS — Z9089 Acquired absence of other organs: Secondary | ICD-10-CM | POA: Insufficient documentation

## 2013-09-21 DIAGNOSIS — Z792 Long term (current) use of antibiotics: Secondary | ICD-10-CM | POA: Insufficient documentation

## 2013-09-21 DIAGNOSIS — Z8739 Personal history of other diseases of the musculoskeletal system and connective tissue: Secondary | ICD-10-CM | POA: Insufficient documentation

## 2013-09-21 MED ORDER — METHADONE HCL 10 MG/ML PO CONC
30.0000 mg | Freq: Once | ORAL | Status: DC
  Filled 2013-09-21: qty 3

## 2013-09-21 MED ORDER — HYDROMORPHONE HCL PF 1 MG/ML IJ SOLN
1.0000 mg | Freq: Once | INTRAMUSCULAR | Status: AC
  Administered 2013-09-21: 1 mg via INTRAVENOUS
  Filled 2013-09-21: qty 1

## 2013-09-21 MED ORDER — METHADONE HCL 5 MG/5ML PO SOLN
30.0000 mg | Freq: Once | ORAL | Status: AC
  Administered 2013-09-21: 30 mg via ORAL
  Filled 2013-09-21: qty 500

## 2013-09-21 MED ORDER — HEPARIN SOD (PORK) LOCK FLUSH 100 UNIT/ML IV SOLN
500.0000 [IU] | Freq: Once | INTRAVENOUS | Status: AC
  Administered 2013-09-21: 500 [IU]
  Filled 2013-09-21: qty 5

## 2013-09-21 MED ORDER — ONDANSETRON HCL 4 MG/2ML IJ SOLN
4.0000 mg | Freq: Once | INTRAMUSCULAR | Status: AC
  Administered 2013-09-21: 4 mg via INTRAVENOUS

## 2013-09-21 MED ORDER — ONDANSETRON HCL 4 MG/2ML IJ SOLN
4.0000 mg | Freq: Once | INTRAMUSCULAR | Status: DC
Start: 2013-09-21 — End: 2013-09-21
  Filled 2013-09-21: qty 2

## 2013-09-21 NOTE — ED Notes (Signed)
Peg tube placement checked and is good.

## 2013-09-21 NOTE — ED Notes (Signed)
PICC line to left upper arm is accessed.

## 2013-09-21 NOTE — ED Notes (Signed)
Pt arrived by [facility] from home with c/o lower abd pain and lower back pain. 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 Pain to abdomen and back is coming from cancer and pt was prescribed methadone but prescription is expired and will not be able to get pain medication until Monday. Pt has tried oxycodone for pain with no relief and zofran for nausea which was effective. PICC line present to left upper arm.

## 2013-09-21 NOTE — ED Provider Notes (Signed)
CSN: [id number]     Arrival date & time 09/21/13  3875 History   First MD Initiated Contact with Patient 09/21/13 [phone]     Chief Complaint  Patient presents with  . Abdominal Pain  . Back Pain   (Consider location/radiation/quality/duration/timing/severity/associated sxs/prior Treatment) Patient is a 47 y.o. female presenting with abdominal pain and back pain. The history is provided by the patient. No language interpreter was used.  Abdominal Pain Pain location:  Generalized Pain radiates to:  Back Pain severity:  Severe Onset quality:  Gradual Timing:  Constant Progression:  Worsening Chronicity:  Chronic Context comment:  Cancer Relieved by:  Nothing Worsened by:  Nothing tried Associated symptoms: shortness of breath   Associated symptoms: no sore throat   Back Pain Associated symptoms: abdominal pain   Pt here requesting help with pain control.   Pt has esophageal cancer.    Past Medical History  Diagnosis Date  . Multiple sclerosis   . Irritable bowel syndrome   . Arthritis   . Pancreatitis   . Anxiety   . Depression   . Chronic pain   . Migraine   . PTSD (post-traumatic stress disorder)   . Methadone use   . PONV (postoperative nausea and vomiting)   . 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 Grave's disease   . hypothyroidism   . Neuromuscular disorder   . Substance abuse    Past Surgical History  Procedure Laterality Date  . Sphincterotomy    . Cholecystectomy    . Eus N/A 08/08/2013    Procedure: ESOPHAGEAL ENDOSCOPIC ULTRASOUND (EUS) RADIAL;  Surgeon: Theda Belfast, MD;  Location: [facility];  Service: Endoscopy;  Laterality: N/A;  . 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 Hernia repair     Family History  Problem Relation Age of Onset  . Cancer Mother     ovarian, colon, breast  . Hypertension Father   . Cancer Maternal Uncle     lung  . Cancer Maternal  Grandmother     lung   History  Substance Use Topics  . Smoking status: Current Every Day Smoker -- 0.50 packs/day for 8 years  . Smokeless tobacco: Never Used  . Alcohol Use: No   OB History   Grav Para Term Preterm Abortions TAB SAB Ect Mult Living                 Review of Systems  HENT: Negative for sore throat.   Respiratory: Positive for shortness of breath.   Gastrointestinal: Positive for abdominal pain.  Musculoskeletal: Positive for back pain.  All other systems reviewed and are negative.    Allergies  Erythromycin and Morphine and related  Home Medications   Current Outpatient Rx  Name  Route  Sig  Dispense  Refill  . busPIRone (BUSPAR) 15 MG tablet   Oral   Take 15 mg by mouth 2 (two) times daily.         Marland Kitchen escitalopram (LEXAPRO) 10 MG tablet   Oral   Take 30 mg by mouth daily after supper.          . esomeprazole (NEXIUM) 20 MG capsule   Oral   Take 20 mg by mouth daily before breakfast.         . imipramine (TOFRANIL) 50 MG tablet   Oral   Take 100 mg by mouth at bedtime.          Marland Kitchen  levothyroxine (SYNTHROID, LEVOTHROID) 100 MCG tablet   Oral   Take 100 mcg by mouth daily before breakfast.          . lidocaine-prilocaine (EMLA) cream      Apply 1-2 hours prior to Porta-Cath accessed   30 g   2   . Melatonin 3 MG TABS   Oral   Take 3-6 mg by mouth at bedtime as needed (For sleep.).          Marland Kitchen methadone (DOLOPHINE) 10 MG/5ML solution   Per J Tube   15 mLs (30 mg total) by Per J Tube route every 8 (eight) hours.   500 mL   0     Metastatic Esophageal cancer   . Multiple Vitamin (MULTIVITAMIN WITH MINERALS) TABS   Oral   Take 1 tablet by mouth at bedtime.          . nicotine (NICODERM CQ - DOSED IN MG/24 HOURS) 14 mg/24hr patch   Transdermal   Place 1 patch onto the skin daily.         . ondansetron (ZOFRAN) 8 MG tablet   Oral   Take 8 mg by mouth every 8 (eight) hours as needed for nausea.         Marland Kitchen oxyCODONE  (ROXICODONE) 5 MG/5ML solution   Per Tube   Place 10 mLs (10 mg total) into feeding tube every 4 (four) hours as needed for moderate pain.   500 mL   0   . potassium chloride (K-DUR,KLOR-CON) 10 MEQ tablet   Oral   Take 10 mEq by mouth 2 (two) times daily.          Marland Kitchen lactulose (CHRONULAC) 10 GM/15ML solution   Per Tube   Place 15 mLs (10 g total) into feeding tube 2 (two) times daily.   240 mL   0     Hold for diarrhea   . levofloxacin (LEVAQUIN) 750 MG tablet   Oral   Take 1 tablet (750 mg total) by mouth daily.   5 tablet   0     1 tab po for 5 days    BP 147/81  Pulse 89  Temp(Src) 97.8 F (36.6 C) (Oral)  SpO2 99%  LMP 04/28/2011 Physical Exam  Nursing note and vitals reviewed. Constitutional: She is oriented to person, place, and time. She appears well-developed and well-nourished.  HENT:  Head: Normocephalic.  Eyes: Pupils are equal, round, and reactive to light.  Neck: Normal range of motion.  Cardiovascular: Normal rate.   Pulmonary/Chest: Effort normal.  Abdominal: Soft. She exhibits no distension.  Musculoskeletal: Normal range of motion.  Neurological: She is alert and oriented to person, place, and time.  Skin: Skin is warm.  Psychiatric: She has a normal mood and affect.    ED Course  Procedures (including critical care time) Labs Review Labs Reviewed - No data to display Imaging Review No results found.  EKG Interpretation   None       MDM   1. Chronic pain    Pt given Iv dilaudid.   Pt given methadone 30 mg.    Pt advised to follow up with her MD at cancer center tomorrow    Elson Areas, PA-C 09/21/13 [address] [place], [place] 09/21/13 [phone]

## 2013-09-21 NOTE — ED Provider Notes (Signed)
Medical screening examination/treatment/procedure(s) were performed by non-physician practitioner and as supervising physician I was immediately available for consultation/collaboration.  EKG Interpretation   None         Richardean Canal, MD 09/21/13 [phone]

## 2013-09-22 ENCOUNTER — Telehealth: Payer: Self-pay | Admitting: Oncology

## 2013-09-22 ENCOUNTER — Ambulatory Visit: Payer: Medicare Other

## 2013-09-22 ENCOUNTER — Encounter (HOSPITAL_COMMUNITY): Payer: Self-pay | Admitting: General Surgery

## 2013-09-22 ENCOUNTER — Telehealth: Payer: Self-pay

## 2013-09-22 NOTE — Telephone Encounter (Signed)
[name] states that she has PICC line in.  She is actually infusing a liter of NS with 20 meq of KCL over 3-4 hours via PICC as she has not had Tube feeding in ~48 hours. This fluid was left over from orders prior to hospitalization. Will call [facility] tomorrow to have PICC Line dc'd. [name] wants to know if she should continue with IVF as above. She also wanted to know if she needs to continue with fentanyl patch, Levaquin, and ativan twice a day as she was on these meds in the hospital but not discharged on them. Told her that this would be reviewed with Dr. Darrold Span.

## 2013-09-22 NOTE — Telephone Encounter (Signed)
added nutrition appt for 11/21. per staff message from [facility] pt can come @ 12:45pm 11/21. adjusted lb time to 12:15pm and pt will have LL after BN

## 2013-09-22 NOTE — Telephone Encounter (Signed)
          Message Received: 1 day ago     Reece Packer, MD Lorine Bears, RN; Phillis Knack, RN            DC from hospital 11-15. Has PAC, TNA completed, RN discharge note says "PICC flushed". She does not need PICC if it really was left in. I believe [facility] is involved if need them to remove PICC.  I see her 11-21.    Cc LA, TH

## 2013-09-22 NOTE — Telephone Encounter (Signed)
add to previous note. still not able to reach pt or lm - vm full. adjusted appts on 11/21 and mailed new schedule

## 2013-09-22 NOTE — Telephone Encounter (Signed)
Medical Oncology  Patient DC from [facility] on 09-20-13 with plans to be on Osmolite 1.2 at 40 cc/hr via J tube, with additional free water and rate increase by 10 cc every 12 hrs until goal. Supplies delivered by [facility] on 09-21-13, however, as of 1700 today, no one had started the tube feedings. This MD spoke directly with 3 staff at [facility], with plan for someone to make visit tonight to begin the tube feeds. [facility] given home and cell # for patient's mother [name] as well as her street address, as patient is staying with mother. MD will keep pager available this PM if questions re tube feedings when RN goes to home. MD spoke briefly with patient's mother on home phone to let her know plan re [facility] starting tube feedings tonight. MD also LM now for [facility] dietician in case other support needed tomorrow.  Ila Mcgill, MD

## 2013-09-23 ENCOUNTER — Encounter: Payer: Self-pay | Admitting: Oncology

## 2013-09-23 ENCOUNTER — Ambulatory Visit: Payer: Medicare Other

## 2013-09-23 NOTE — Telephone Encounter (Signed)
Spoke with Macon Large at [facility] and gave order to have PICC line DC"d today.  Bjorn Loser has address of mother where Kathrine Cords is staying and all phone numbers of mother and patient. Left message in vm of Ms. Gazzola stating that [facility] will be calling to set up time to come to the house to remove PICC line. Stated that she was discharged on Levaquin and the prescription is at her pharmacy.  Requested that Ms. Shawnie Dapper call this RN to verify receipt of message.

## 2013-09-23 NOTE — Progress Notes (Signed)
Medical Oncology  Per [facility] dietician today, [facility] delivered incorrect tube feedings and equipemnt over weekend and are to deliver correct Osmolite 1.2 and equipment today.  Ila Mcgill, MD

## 2013-09-23 NOTE — Telephone Encounter (Signed)
Spoke with Dr.Livesay about the Duragesic patch.  Pt. was started on  Duragesic  25 mcg patch  on 09-19-13.  Not able to reach patient to discuss.  Will follow up at 09-26-13 visit regarding pain management  or sooner if patient calls the [facility]. No further IVF needed at this time with tube feedings being resumed last evening with free water flush.

## 2013-09-24 ENCOUNTER — Telehealth (INDEPENDENT_AMBULATORY_CARE_PROVIDER_SITE_OTHER): Payer: Self-pay

## 2013-09-24 ENCOUNTER — Ambulatory Visit: Payer: Medicare Other

## 2013-09-24 NOTE — Telephone Encounter (Signed)
Pt called to confirm that sutures that are attached to feeding tube are to stay in place and not be removed. Pt advised the sutures that tie the tube in place are there to help keep tube from coming out. Pt states she just wanted to be sure. Pt has appt this week with Dr Darrold Span and will call with any concerns.

## 2013-09-25 ENCOUNTER — Ambulatory Visit: Payer: Medicare Other

## 2013-09-25 LAB — CULTURE, BLOOD (ROUTINE X 2): Culture: NO GROWTH

## 2013-09-26 ENCOUNTER — Ambulatory Visit: Payer: Medicare Other | Admitting: Nutrition

## 2013-09-26 ENCOUNTER — Ambulatory Visit (HOSPITAL_BASED_OUTPATIENT_CLINIC_OR_DEPARTMENT_OTHER): Payer: Medicare Other | Admitting: Lab

## 2013-09-26 ENCOUNTER — Encounter: Payer: Self-pay | Admitting: *Deleted

## 2013-09-26 ENCOUNTER — Telehealth: Payer: Self-pay | Admitting: *Deleted

## 2013-09-26 ENCOUNTER — Ambulatory Visit (HOSPITAL_BASED_OUTPATIENT_CLINIC_OR_DEPARTMENT_OTHER): Payer: Medicare Other | Admitting: Oncology

## 2013-09-26 ENCOUNTER — Ambulatory Visit: Payer: Medicare Other

## 2013-09-26 ENCOUNTER — Encounter: Payer: Self-pay | Admitting: Oncology

## 2013-09-26 VITALS — BP 108/74 | HR 108 | Temp 98.3°F | Resp 18 | Ht 63.0 in | Wt 106.5 lb

## 2013-09-26 DIAGNOSIS — C159 Malignant neoplasm of esophagus, unspecified: Secondary | ICD-10-CM

## 2013-09-26 DIAGNOSIS — C155 Malignant neoplasm of lower third of esophagus: Secondary | ICD-10-CM

## 2013-09-26 DIAGNOSIS — E876 Hypokalemia: Secondary | ICD-10-CM

## 2013-09-26 DIAGNOSIS — C7951 Secondary malignant neoplasm of bone: Secondary | ICD-10-CM

## 2013-09-26 LAB — COMPREHENSIVE METABOLIC PANEL (CC13)
AST: 65 U/L — ABNORMAL HIGH (ref 5–34)
Albumin: 2.9 g/dL — ABNORMAL LOW (ref 3.5–5.0)
Alkaline Phosphatase: 197 U/L — ABNORMAL HIGH (ref 40–150)
BUN: 7.2 mg/dL (ref 7.0–26.0)
CO2: 29 mEq/L (ref 22–29)
Creatinine: 0.6 mg/dL (ref 0.6–1.1)
Glucose: 118 mg/dl (ref 70–140)
Potassium: 3.1 mEq/L — ABNORMAL LOW (ref 3.5–5.1)

## 2013-09-26 LAB — CBC WITH DIFFERENTIAL/PLATELET
Basophils Absolute: 0 10*3/uL (ref 0.0–0.1)
EOS%: 2.4 % (ref 0.0–7.0)
Eosinophils Absolute: 0.1 10*3/uL (ref 0.0–0.5)
HCT: 36.9 % (ref 34.8–46.6)
HGB: 12.6 g/dL (ref 11.6–15.9)
LYMPH%: 15.2 % (ref 14.0–49.7)
MCH: 31.3 pg (ref 25.1–34.0)
MCV: 91.6 fL (ref 79.5–101.0)
MONO%: 7.5 % (ref 0.0–14.0)
NEUT#: 3.7 10*3/uL (ref 1.5–6.5)
NEUT%: 74.3 % (ref 38.4–76.8)
Platelets: 410 10*3/uL — ABNORMAL HIGH (ref 145–400)

## 2013-09-26 MED ORDER — FENTANYL 25 MCG/HR TD PT72: 25.0000 ug | MEDICATED_PATCH | TRANSDERMAL | Status: DC

## 2013-09-26 MED ORDER — OXYCODONE HCL 5 MG/5ML PO SOLN: 5.0000 mg | ORAL | Status: DC | PRN

## 2013-09-26 MED ORDER — PANTOPRAZOLE SODIUM 40 MG PO TBEC: 40.0000 mg | DELAYED_RELEASE_TABLET | Freq: Every day | ORAL | Status: DC

## 2013-09-26 MED ORDER — ONDANSETRON 8 MG PO TBDP: 8.0000 mg | ORAL_TABLET | Freq: Three times a day (TID) | ORAL | Status: DC | PRN

## 2013-09-26 MED ORDER — POTASSIUM CHLORIDE 20 MEQ/15ML (10%) PO LIQD: 20.0000 meq | Freq: Two times a day (BID) | ORAL | Status: AC

## 2013-09-26 NOTE — Progress Notes (Signed)
Patient presents to nutrition followup.  She is status post placement of feeding jejunostomy tube.  Currently patient is tolerating Osmolite 1.2 via jejunostomy tube at 35 mL an hour over 24 hours.  She has been trying to get 160 mL of free water 4 times a day however, reports intolerance with this.  Patient reports she can drink liquids by mouth, although this must be done very slowly.  She even tried some broth with mashed up vegetables with good tolerance.  Patient's mother had questions regarding lactulose.  Osmolite 1.2 at 35 mL an hour provides 1008 calories, 46.6 g protein, 689 mL free water.  Weight: 106.5 pounds November 21. Weight: 116.6 pounds November 15. Weight: 109.6 pounds November 4.  Labs: Sodium 135, potassium 3.1, glucose 118, albumin 2.9 on November 21.  Estimated nutrition needs: 1600-1800 calories, 80-100 g protein, 1.8 L fluid.  Nutrition diagnosis: Unintended weight loss continues.  Intervention: Patient educated to increase Osmolite 1.2, 10 mL daily, to goal rate of 60 mL an hour for 24 hours daily.  I will change free water flushes to 30 cc every hour given by feeding pump.  I have notified [facility] who will arrange for appropriate bags to be delivered.  Patient educated to discuss medications with physician.  M.D. to manage electrolytes. Tube feeding at goal to provide 100% of estimated nutrition needs.    6 Cans of Osmolite 1.2 (goal) daily provides 1728 calories, 80 g protein, 1181 mL free water for a total free water of 1901 mL.  Monitoring, evaluation, goals: Patient will tolerate tube feeding advancements to goal of 60 mL an hour to minimize further weight loss and improve repletion of lean body mass.  Next visit: Followup to be completed as needed.  Patient has my contact information for questions or concerns.

## 2013-09-26 NOTE — Progress Notes (Signed)
[facility] Clinical Social Work  This CSW met with Pt at her request. She was to have an appt with Kathrin Penner today, but Leotis Shames was out sick. CSW met with Pt in the exam room with her mother for assessment of psychosocial needs.  Pt currently lives with her mother who provides care and assistance for her. Pt's mother drives her to appointments and assists with Pt's children. Pt has four children, the youngest of which is 46yo. Pt shared concerns that her daughter is having difficulty coping and she expressed feelings of anticipatory grief and loss.  Pt also shared she and her daughter were abandoned by her father several years ago and as a result daughter is worried she may lose her mother too. CSW provided handouts from [employer] and suggested family access this resource for additional family counseling. Pt has three other children, all in their teens and are also concerned. Additional support will be most helpful.  Pt shared financial concerns, but has yet to meet with financial counselors here for further assistance. Pt now aware of how to access them. Pt can get to appointments currently, but is aware of how to access [facility] if needed and agrees to let CSWs know if she needs help in the future. She reports she received gas cards from [facility] in the past and found that helpful. Family has many financial concerns as well and agree to meet with CSW again to discuss further. CSW provided list of other community resources they may find helpful as well.   CSW provided Pt with ADR/HCPOA packet and she states she has begun to make plans for her children as well. Planning for her 46yo's future is a concern as her father is not local. CSW stressed the need for a plan in writing to assist with daughter. Pt shared this is also a concern and one she would like to explore further. Pt is also worried about her son who just turned 18yo, but is autistic and has special needs. CSW provided Pt with additional resources that could  possibly help him as he moves to on as an adult with special needs. Pt states his medicaid was just cut, so this is an additional concern.  CSW also provided Pt with a list of free Thanksgiving meals and option to be assisted by the [employer].     Clinical Social Work interventions: Supportive Mining engineer and referral ADR packet  Pt very tired today, but very appreciative of many resources and assistance provided. Pt agrees to follow up with the team in the next week for additional supports.  Doreen Salvage, LCSW Clinical Social Worker [facility] Family [facility] Wednesday, Thursday and Friday Phone: [phone] Fax: [phone]

## 2013-09-26 NOTE — Telephone Encounter (Signed)
Per staff message and POF I have scheduled appts.  JMW  

## 2013-09-26 NOTE — Patient Instructions (Signed)
Contact Dr Betti Cruz about buspirone, lexapro and imipramine. If you cannot reach him before this weekend, resume buspirone at 15 mg and resume lexapro over weekend  We will send scripts for protonix and zofran ODT.  (You will probably need approval for Duragesic from insurance, so take script to pharmacy and they can start process if required)  Try to drink gatorade if possible as Na and Cl a little low.   Resume potassium 20 mEq twice daily -- 2 doses today as K 3.1 today.

## 2013-09-26 NOTE — Progress Notes (Signed)
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 We plan palliative chemotherapy if PS improves to allow this. She was hospitalized from 11-7 thru 09-19-13 due to inability to swallow prior to the J tube placement, briefly on TNA until tube feedings initiated, treated for hypokalemia and radiographic pneumonia, and pain medication adjusted. She was DC home with [facility], however did not get correct equipment to begin tube feedings for at least 48 hours. She is now on continuous tube feedings at 35 ml/ hour, has not tolerated 160 ml free water boluses, and has met with [facility] dietician with changes in regimen today. She also did not get all of medications resumed at DC and did not get prescription for duragesic patch, all addressed now. She has had no fever and no worsening respiratory symptoms. The J tube seems to function well and she is swallowing more easily, still liquids and most meds. She has been nauseated after large volumes of free water per J tube. No bleeding. Pain is mostly in back. She has PAC. PICC is out.   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 Prior upper endoscopy 3 years ago was normal. Recently she developed difficulty swallowing and pain from epigastrium to LLQ. EGD by Dr [name] in [place] found a stenosed area in distal esophagus which was friable, with abnormal mucosa there and in gastric lumen.  Pathology from [facility] showed adenocarcinomaHER 2 was 2+ by immunohistochemical staining, with FISH negative ([facility] addendum from 24 Sept 2014 accession # [id number]). She was hospitalized in [facility] 9-23 thru 08-01-13 with pneumonia, with SOB, hypoxemia and dizziness, also hypokalemic. CT CAP 07-31-13 had extensive ground glass opacities in lungs with diffuse peribronchial thickening, esophageal dilatation with confluent soft tissue density involving distal esophagus and GE junction/ adjacent stomach, enlarged AP window node and prominent subcarinal node, mildly enlarged nodes retroperitoneum and root of mesentery, nodal mass 5.4 x 2.7 cm encasing celiac axis and proximal superior mesenteric artery, diffuse hepatic steatosis and bilateral adrenal hyperplasia. Endoscopic ultrasound by Dr [name] 08-08-13 documented stenosing and friable mass from 40 cm extending 5 cm into proximal stomach with maximal depth of invasion 28mm, extending beyond the adventitia; two large lymph nodes were identified, measuring 10mm, however celiac axis area could not be evaluated, this felt to be T4N2 distal esophageal/ proximal gastric tumor. MRI head 08-04-13 was unremarkable. PET 08-19-13 showed primary distal esophagus/ GE junction with involved nodes at gastrohepatic ligament and celiac axis as well as left periaortics, and bony mets T7, T11 and possibly T9 and T10. She received 30 Gy to distal esophagus/GE junction/ T9-11 by Dr Basilio Cairo from 10-20 thru 09-05-13. She was admitted 11-7 thru 09-20-13 prior to availability of G tube placement, with    Review of systems as above, also: No LE swelling. No bladder symptoms. Suture at J tube is uncomfortable when pulled and we have discussed securing tubing to decrease tension. Remainder of 10 point Review of Systems negative.  I have discussed with [facility] dietician today.  Patient and mother have also met with [facility] dietician and social worker now.  Note  patient has attended chemotherapy teaching class for FOLFOX.  Objective:  Vital signs in last 24 hours:  BP 108/74  Pulse 108  Temp(Src) 98.3 F (36.8 C) (Oral)  Resp 18  Ht 5\' 3"  (1.6 m)  Wt 106 lb 8 oz (48.308 kg)  BMI 18.87 kg/m2  SpO2 96%  LMP 04/28/2011  Alert, oriented and appropriate. Ambulatory without assistance. Looks chronically ill but more comfortable overall.   HEENT:PERRL, sclerae not icteric. Oral mucosa moist without lesions, posterior pharynx clear. No alopecia. Neck supple. No JVD.  Lymphatics:no cervical,suraclavicular, axillary or inguinal adenopathy Resp: clear to auscultation bilaterally and normal percussion bilaterally Cardio: regular rate and rhythm. No gallop. GI: soft, no increased distension, some bowel sounds present. J tube site with minimal erythema at suture, no drainage, incision closed. Musculoskeletal/ Extremities: without pitting edema, cords, tenderness Neuro: no focal changes apparent grossly Skin without rash, ecchymosis, petechiae Portacath-without erythema or tenderness  Lab Results:  Results for orders placed in visit on 09/26/13  CBC WITH DIFFERENTIAL      Result Value Range   WBC 4.9  3.9 - 10.3 10e3/uL   NEUT# 3.7  1.5 - 6.5 10e3/uL   HGB 12.6  11.6 - 15.9 g/dL   HCT 16.1  09.6 - 04.5 %   Platelets 410 (*) 145 - 400 10e3/uL   MCV 91.6  79.5 - 101.0 fL   MCH 31.3  25.1 - 34.0 pg   MCHC 34.1  31.5 - 36.0 g/dL   RBC 4.09  8.11 - 9.14 10e6/uL   RDW 14.5  11.2 - 14.5 %   lymph# 0.8 (*) 0.9 - 3.3 10e3/uL   MONO# 0.4  0.1 - 0.9 10e3/uL   Eosinophils Absolute 0.1  0.0 - 0.5 10e3/uL   Basophils Absolute 0.0  0.0 - 0.1 10e3/uL   NEUT% 74.3  38.4 - 76.8 %   LYMPH% 15.2  14.0 - 49.7 %   MONO% 7.5  0.0 - 14.0 %   EOS% 2.4  0.0 - 7.0 %   BASO% 0.6  0.0 - 2.0 %  COMPREHENSIVE METABOLIC PANEL (CC13)      Result Value Range   Sodium 135 (*) 136 - 145 mEq/L   Potassium 3.1 (*) 3.5 - 5.1 mEq/L   Chloride 94 (*) 98 - 109 mEq/L    CO2 29  22 - 29 mEq/L   Glucose 118  70 - 140 mg/dl   BUN 7.2  7.0 - 78.2 mg/dL   Creatinine 0.6  0.6 - 1.1 mg/dL   Total Bilirubin 9.56  0.20 - 1.20 mg/dL   Alkaline Phosphatase 197 (*) 40 - 150 U/L   AST 65 (*) 5 - 34 U/L   ALT 55  0 - 55 U/L   Total Protein 6.9  6.4 - 8.3 g/dL   Albumin 2.9 (*) 3.5 - 5.0 g/dL   Calcium 9.5  8.4 - 21.3 mg/dL   Anion Gap 12 (*) 3 - 11 mEq/L     Studies/Results:  Renal US 09-16-13 not remarkable  CXR 09-19-13 left basilar atelectasis vs pneumonia   Medications: I have reviewed the patient's current medications. Patient will call Dr Betti Cruz re if she should resume buspirone, lexapro and imipramine; if she cannot reach Dr Betti Cruz before next week, she will resume buspirone and imipramine until she talks with him.   Prescriptions for Duragesic 25 mcg patches #5 and oxycodone solution 5  mg/5 ml given now. Not clear if we can get Protonix with her insurance, tho this was more helpful in hospital than present nexium. Resume K po or by J tube.  She will complete levaquin tho not clear if CXR findings were actual pneumonia.  DISCUSSION: Patient and mother are encouraged that physical problems are better able to be managed now. They agree with continuing to increase tube feedings to goal over next week or so, then she does still want to try palliative chemotherapy if possible. Improvement in swallowing likely related to recent RT.    Assessment/Plan:  1. 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 Plan FOLFOX in palliative attempt, to begin hopefully first week Dec if situation allows then. I will see her 12-3 with first FOLFOX shortly afterwards. Zometa begun 08-28-13, will try to give at next appointment.  2.chronic pancreatitis since cholecystectomy 1993, better in past with adenopathy including mass at celiac axis, left supraclavicular node, retroperitoneal nodes, several bony mets in T spine  by PET: RT to primary tumor area started today. We will plan to start chemo in palliative attempt likely with FOLFOX after RT completes. I will coordinate pain management with [facility].  2 chronic pancreatitis since cholecystectomy 1993, better in past 2 years post sphincterotomy and stents  3. On methadone maintenance for drug history, Dr Betti Cruz aware of recent cancer diagnosis.  4.long tobacco recently DCd  5. PAC placed  6.post treatment of Graves disease, now on replacement for treatment induced hypothyroidism  7.hx migraines but tolerates zofran  8.flu vaccine done  9.multiple sclerosis not more symptomatic presently  10.long QT syndrome  11.hypokalemia: resume supplement po or thru J tube  12. Interstitial pneumonia treated during hospitalization in Sept.  13. Code status DNR at patient's request      Reece Packer, MD   09/26/2013, 2:03 PM

## 2013-09-26 NOTE — Telephone Encounter (Signed)
appts made and printed...td 

## 2013-09-28 ENCOUNTER — Other Ambulatory Visit: Payer: Self-pay | Admitting: Oncology

## 2013-09-29 ENCOUNTER — Telehealth: Payer: Self-pay | Admitting: *Deleted

## 2013-09-29 ENCOUNTER — Telehealth: Payer: Self-pay | Admitting: Oncology

## 2013-09-29 ENCOUNTER — Ambulatory Visit: Payer: Medicare Other

## 2013-09-29 NOTE — Telephone Encounter (Signed)
email to MW to add tx per 11/23 POF Call pt when done shh

## 2013-09-29 NOTE — Telephone Encounter (Signed)
Per staff message and POF I have scheduled appts.  JMW  

## 2013-09-30 ENCOUNTER — Ambulatory Visit: Payer: Medicare Other

## 2013-09-30 ENCOUNTER — Telehealth: Payer: Self-pay | Admitting: *Deleted

## 2013-09-30 NOTE — Telephone Encounter (Signed)
Pt left a voice mail stating she was discharged from the hospital with methadone and she needs a refill. States it is controlling her pain and she will be out in a couple of days. Attempted to call patient back, left her a voice mail that we will let Dr Darrold Span know of her request on Wednesday 10/01/13. To call back if she needs it sonner

## 2013-10-01 ENCOUNTER — Ambulatory Visit: Payer: Medicare Other

## 2013-10-01 ENCOUNTER — Telehealth: Payer: Self-pay

## 2013-10-01 NOTE — Telephone Encounter (Signed)
Dr. Reddy's office cannot refill methadone as [name] was discharged from the clinic as stated by  [facility] at Clinic [phone]. She is currently on Duragesic patch 25 mcg. q 3 days and Oxycodone solution 5 mg/5 ml via j-tube.  She is currently taking 10 mg q 4 hours aroun the clock.  Her pain level is a 5/10. She states that the methadone 10 mg / 5 ml solution dosed at 30 mg q 8 hours helps decrease her pain for 2 hours and then begins to increase.  [name] concerned about her pain without the methadone. Told her that Dr. Darrold Span said to increase the patch to 50 mcg now and continue with the Oxycodone for breakthrough pain. The protonix compounded needs prior authorization.  [place] faxed prior authorization to the office.  [name] in the meantime will open nexium capsule as dr. Darrold Span suggests and sprinkle contents in pudding. [name] verbalized understanding.

## 2013-10-02 ENCOUNTER — Emergency Department (HOSPITAL_COMMUNITY)
Admission: EM | Admit: 2013-10-02 | Discharge: 2013-10-02 | Disposition: A | Discharge status: Home / Self Care | Payer: Medicare Other | Attending: Emergency Medicine | Admitting: Emergency Medicine

## 2013-10-02 ENCOUNTER — Emergency Department (HOSPITAL_COMMUNITY): Payer: Medicare Other

## 2013-10-02 ENCOUNTER — Encounter (HOSPITAL_COMMUNITY): Payer: Self-pay | Admitting: Emergency Medicine

## 2013-10-02 DIAGNOSIS — W010XXA Fall on same level from slipping, tripping and stumbling without subsequent striking against object, initial encounter: Secondary | ICD-10-CM | POA: Insufficient documentation

## 2013-10-02 DIAGNOSIS — E0500 Thyrotoxicosis with diffuse goiter without thyrotoxic crisis or storm: Secondary | ICD-10-CM | POA: Insufficient documentation

## 2013-10-02 DIAGNOSIS — Y939 Activity, unspecified: Secondary | ICD-10-CM | POA: Insufficient documentation

## 2013-10-02 DIAGNOSIS — F329 Major depressive disorder, single episode, unspecified: Secondary | ICD-10-CM | POA: Insufficient documentation

## 2013-10-02 DIAGNOSIS — Y92009 Unspecified place in unspecified non-institutional (private) residence as the place of occurrence of the external cause: Secondary | ICD-10-CM | POA: Insufficient documentation

## 2013-10-02 DIAGNOSIS — S20219A Contusion of unspecified front wall of thorax, initial encounter: Secondary | ICD-10-CM | POA: Insufficient documentation

## 2013-10-02 DIAGNOSIS — Z8719 Personal history of other diseases of the digestive system: Secondary | ICD-10-CM | POA: Insufficient documentation

## 2013-10-02 DIAGNOSIS — Z79899 Other long term (current) drug therapy: Secondary | ICD-10-CM | POA: Insufficient documentation

## 2013-10-02 DIAGNOSIS — S20212A Contusion of left front wall of thorax, initial encounter: Secondary | ICD-10-CM

## 2013-10-02 DIAGNOSIS — E039 Hypothyroidism, unspecified: Secondary | ICD-10-CM | POA: Insufficient documentation

## 2013-10-02 DIAGNOSIS — C159 Malignant neoplasm of esophagus, unspecified: Secondary | ICD-10-CM | POA: Insufficient documentation

## 2013-10-02 DIAGNOSIS — F3289 Other specified depressive episodes: Secondary | ICD-10-CM | POA: Insufficient documentation

## 2013-10-02 DIAGNOSIS — C799 Secondary malignant neoplasm of unspecified site: Secondary | ICD-10-CM

## 2013-10-02 DIAGNOSIS — F17200 Nicotine dependence, unspecified, uncomplicated: Secondary | ICD-10-CM | POA: Insufficient documentation

## 2013-10-02 DIAGNOSIS — F411 Generalized anxiety disorder: Secondary | ICD-10-CM | POA: Insufficient documentation

## 2013-10-02 DIAGNOSIS — G35 Multiple sclerosis: Secondary | ICD-10-CM | POA: Insufficient documentation

## 2013-10-02 DIAGNOSIS — M129 Arthropathy, unspecified: Secondary | ICD-10-CM | POA: Insufficient documentation

## 2013-10-02 DIAGNOSIS — G8929 Other chronic pain: Secondary | ICD-10-CM | POA: Insufficient documentation

## 2013-10-02 DIAGNOSIS — W1809XA Striking against other object with subsequent fall, initial encounter: Secondary | ICD-10-CM | POA: Insufficient documentation

## 2013-10-02 DIAGNOSIS — G43909 Migraine, unspecified, not intractable, without status migrainosus: Secondary | ICD-10-CM | POA: Insufficient documentation

## 2013-10-02 MED ORDER — METHADONE HCL 5 MG/5ML PO SOLN: 30.0000 mg | Freq: Three times a day (TID) | ORAL | Status: DC | PRN

## 2013-10-02 MED ORDER — METHADONE HCL 10 MG/ML PO CONC
30.0000 mg | Freq: Once | ORAL | Status: AC
  Administered 2013-10-02: 30 mg

## 2013-10-02 MED ORDER — METHADONE HCL 5 MG/5ML PO SOLN: 30.0000 mg | Freq: Once | ORAL | Status: DC

## 2013-10-02 MED ORDER — HYDROMORPHONE HCL PF 1 MG/ML IJ SOLN
1.0000 mg | Freq: Once | INTRAMUSCULAR | Status: AC
  Administered 2013-10-02: 1 mg via INTRAMUSCULAR
  Filled 2013-10-02: qty 1

## 2013-10-02 NOTE — ED Notes (Addendum)
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 Pt states that it made her dizzy and she fell today, hitting her L ribs on a recliner falling on a carpeted floor. Pt reports that she hit her head but had no LOC. Pt reports L rib pain. Pt is A&O and in NAD

## 2013-10-02 NOTE — ED Notes (Signed)
Pt is waiting on her ride; should be here within the next few minutes

## 2013-10-02 NOTE — ED Notes (Signed)
Patient transported to X-ray 

## 2013-10-02 NOTE — ED Provider Notes (Addendum)
CSN: [id number]     Arrival date & time 10/02/13  1728 History   First MD Initiated Contact with Patient 10/02/13 1744     Chief Complaint  Patient presents with  . Fall   (Consider location/radiation/quality/duration/timing/severity/associated sxs/prior Treatment) Patient is a 47 y.o. female presenting with fall. The history is provided by the patient.  Fall Pertinent negatives include no chest pain, no abdominal pain, no headaches and no shortness of breath.  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 Pain constant, dull, non radiating. Worse w palpation, positional changes. No sob. States had been having issues w pain control, stating that has chronic pain, and that formerly was well controlled w methadone. States her oncologist had changed her to fentanyl patches plus oxycodone for breakthrough pain but she doesn't feel the patches work as well.  States had called her oncologist w this issue, and was told to use an additional fentanyl patch which she had done this morning, and thinks that made her excessively drowsy, leading to this mornings stumble and fall. No faintness or dizziness prior to fall. No loc. Denies other injury. No neck pain. States has chronic pain related to her ca, but denies other new pain.     Past Medical History  Diagnosis Date  . Multiple sclerosis   . Irritable bowel syndrome   . Arthritis   . Pancreatitis   . Anxiety   . Depression   . Chronic pain   . Migraine   . PTSD (post-traumatic stress disorder)   . Methadone use   . PONV (postoperative nausea and vomiting)   . 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 Grave's disease   . hypothyroidism   . Neuromuscular disorder   . Substance abuse    Past Surgical History  Procedure Laterality Date  . Sphincterotomy    . Cholecystectomy    . Eus  N/A 08/08/2013    Procedure: ESOPHAGEAL ENDOSCOPIC ULTRASOUND (EUS) RADIAL;  Surgeon: Theda Belfast, MD;  Location: [facility];  Service: Endoscopy;  Laterality: N/A;  . 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 Hernia repair    . Gastrostomy N/A 09/17/2013    Procedure: FEEDING JEJUNOSTOMY TUBE PLACEMENT;  Surgeon: Ernestene Mention, MD;  Location: [facility] OR;  Service: General;  Laterality: N/A;   Family History  Problem Relation Age of Onset  . Cancer Mother     ovarian, colon, breast  . Hypertension Father   . Cancer Maternal Uncle     lung  . Cancer Maternal Grandmother     lung   History  Substance Use Topics  . Smoking status: Current Every Day Smoker -- 0.50 packs/day for 8 years  . Smokeless tobacco: Never Used  . Alcohol Use: No   OB History   Grav Para Term Preterm Abortions TAB SAB Ect Mult Living                 Review of Systems  Constitutional: Negative for fever and chills.  HENT: Negative for sore throat.   Eyes: Negative for visual disturbance.  Respiratory: Negative for shortness of breath.   Cardiovascular: Negative for chest pain.  Gastrointestinal: Negative for nausea, vomiting and abdominal pain.  Genitourinary: Negative for hematuria.  Musculoskeletal: Positive for back pain. Negative for neck pain.  Notes chronic back pain related to her ca, no new pain/injury.     Skin: Negative for wound.  Neurological: Negative for headaches.  Hematological: Does not bruise/bleed easily.  Psychiatric/Behavioral: Negative for dysphoric mood.    Allergies  Erythromycin and Morphine and related  Home Medications   Current Outpatient Rx  Name  Route  Sig  Dispense  Refill  . busPIRone (BUSPAR) 15 MG tablet   Oral   Take 15 mg by mouth 2 (two) times daily.         Marland Kitchen escitalopram (LEXAPRO) 10 MG tablet   Oral   Take 30 mg by mouth daily after supper.          . esomeprazole  (NEXIUM) 20 MG capsule   Oral   Take 20 mg by mouth daily before breakfast.         . fentaNYL (DURAGESIC - DOSED MCG/HR) 25 MCG/HR patch   Transdermal   Place 50 mcg onto the skin every 3 (three) days.         Marland Kitchen imipramine (TOFRANIL) 50 MG tablet   Oral   Take 100 mg by mouth at bedtime.          Marland Kitchen lactulose (CHRONULAC) 10 GM/15ML solution   Per Tube   Place 15 mLs (10 g total) into feeding tube 2 (two) times daily.   240 mL   0     Hold for diarrhea   . levothyroxine (SYNTHROID, LEVOTHROID) 100 MCG tablet   Oral   Take 100 mcg by mouth daily before breakfast.          . lidocaine-prilocaine (EMLA) cream      Apply 1-2 hours prior to Porta-Cath accessed   30 g   2   . Melatonin 5 MG TABS   Oral   Take 5 mg by mouth daily.         . methadone (DOLOPHINE) 10 MG/5ML solution   Per J Tube   15 mLs (30 mg total) by Per J Tube route every 8 (eight) hours.   500 mL   0     Metastatic Esophageal cancer   . Multiple Vitamin (MULTIVITAMIN WITH MINERALS) TABS   Oral   Take 1 tablet by mouth at bedtime.          . nicotine (NICODERM CQ - DOSED IN MG/24 HOURS) 14 mg/24hr patch   Transdermal   Place 1 patch onto the skin daily.         . ondansetron (ZOFRAN ODT) 8 MG disintegrating tablet   Oral   Take 1 tablet (8 mg total) by mouth every 8 (eight) hours as needed for nausea or vomiting.   30 tablet   2   . oxyCODONE (ROXICODONE) 5 MG/5ML solution   Per Tube   Place 5-10 mLs (5-10 mg total) into feeding tube every 4 (four) hours as needed for moderate pain.   500 mL   0   . pantoprazole (PROTONIX) 40 MG tablet   Oral   Take 1 tablet (40 mg total) by mouth daily.   30 tablet   2   . potassium chloride (K-DUR,KLOR-CON) 10 MEQ tablet   Oral   Take 10 mEq by mouth 2 (two) times daily.          . potassium chloride 20 MEQ/15ML (10%) solution   Per Tube   Place 15 mLs (20 mEq total) into feeding tube 2 (two) times daily.   1000 mL  1    BP  106/70  Pulse 112  Temp(Src) 98.7 F (37.1 C) (Oral)  Resp 20  SpO2 99%  LMP 04/28/2011 Physical Exam  Nursing note and vitals reviewed. Constitutional: She is oriented to person, place, and time. She appears well-developed and well-nourished. No distress.  HENT:  Mouth/Throat: Oropharynx is clear and moist.  Eyes: Conjunctivae are normal. Pupils are equal, round, and reactive to light. No scleral icterus.  Neck: Neck supple. No tracheal deviation present.  Cardiovascular: Normal rate.   Pulmonary/Chest: Effort normal and breath sounds normal. No respiratory distress. She exhibits tenderness.  Left chest wall tenderness reproducing symptoms. Symmetric excursion, no crepitus.  Abdominal: Soft. Normal appearance and bowel sounds are normal. She exhibits no distension. There is no tenderness.  No abd wall bruising, tenderness or contusion. Jejunostomy tube site intact without sign of infection  Musculoskeletal: She exhibits no edema.  CTLS spine, non tender, aligned, no step off.   Neurological: She is alert and oriented to person, place, and time.  Steady gait.   Skin: Skin is warm and dry. No rash noted.  Psychiatric: She has a normal mood and affect.    ED Course  Procedures (including critical care time)   Dg Ribs Unilateral W/chest Left  10/02/2013   CLINICAL DATA:  Pain in the left lower lateral rib region status post fall. , history of tobacco use and esophageal malignancy  EXAM: LEFT RIBS AND CHEST - 3+ VIEW  COMPARISON:  Chest x-ray of September 19, 2013.  FINDINGS: The chest film reveals the lungs to be well-expanded and clear. The previously demonstrated area of infiltrate or atelectasis at the left lung base has resolved. There is no pneumothorax or pleural effusion or evidence of a pulmonary contusion. The cardiac silhouette is normal in size. The pulmonary vascularity is not engorged. The left-sided PICC line is been withdrawn. The power port catheter appears unchanged in  position with the tip in the proximal portion of the SVC. The gas pattern in the upper abdomen is nonspecific.  The 2 rib detail films reveal no evidence of an acute displaced fracture. A tubular structure projects over the left upper and mid abdomen is of uncertain etiology.  IMPRESSION: 1. There is no evidence of an acute displaced or nondisplaced rib fracture. There is no pneumothorax or pleural effusion. 2. The previously demonstrated infiltrate in the left lower lobe has resolved.   Electronically Signed   By: David  [place]   On: 10/02/2013 18:11       EKG Interpretation   None       MDM  Dilaudid for pain (pt has ride, does not have to drive).  Xr.  Reviewed nursing notes and prior charts for additional history.   xry neg acute. Hr 92, rr 16. Pulse ox 99% room air.   Pt comfortable.  As pt does not care for how fentanyl patches make her feel and/or control her pain, reports allergy to morphine, can tolerate pills/needs liquid med for j tube, will give small quantity rx for her methadone liquid for chronic pain that she said formerly was controlling her pain well (verified from chart and w pt, that had been on dolophine liquid 30 mg q 8 hrs).  Discussed w pt, need for close pcp/onc follow up tomorrow to discuss long term pain management plan, arrange for future refill of pain meds, recheck, as well as to discuss palliative care referral, incl for pain management, comfort.   Pt requests dose of dolophine  her - ordered.  Pt has ride, does not have to drive.       Suzi Roots, MD 10/02/13 [phone]

## 2013-10-03 ENCOUNTER — Telehealth: Payer: Self-pay

## 2013-10-03 MED ORDER — METHADONE HCL 10 MG PO TABS: 30.0000 mg | ORAL_TABLET | Freq: Three times a day (TID) | ORAL | Status: DC

## 2013-10-03 NOTE — Telephone Encounter (Signed)
[name] called at 1015 this am to see if she could get a prescription for methadone tabs as the pharmacies in town have to order solution.  Reviewed chart  ~1130 and noticed a note from the [facility] ED physician that [name] will bring liquid methadone prescription to ED and a prescription of 50  Methadone tabs to be given. 2 tabs q 8 hrs.  Pt. went to ED yesterday as she fell and not tolerating second patch of Duragesic 25 mcg that she added on 10-02-13  to equal  50 mcg patch. Left a message for [name] that this nurse saw that she contacted the ED.  Stated that this information would be given to Dr. Darrold Span to review plan for pain management upon her return Monday 10-06-13. Called for prior authorization for protonix liquid to go through J tube as [name] has great difficulty with swallowing with disease obstructing  GI tract. ( This process took 55 minutes continuously on the phone). Pharmacist stated that nexium is on patient's formulary and is available in packets that can be reconstituted and given via tub.  She needs to try this first and fail med before authorization can be considered for protonix.   Left this message for patient in her cell voice mail.  Nexium packets are being filled by [facility] and will call patient when prescription is ready.

## 2013-10-03 NOTE — ED Provider Notes (Signed)
Local pharmacies do not have liquid methadone so Pt will bring back Rx from last night and exchange for methadone tabs (Rx ordered).  Hurman Horn, MD 10/03/13 2020

## 2013-10-05 ENCOUNTER — Other Ambulatory Visit: Payer: Self-pay | Admitting: Oncology

## 2013-10-06 ENCOUNTER — Other Ambulatory Visit: Payer: Self-pay | Admitting: Physician Assistant

## 2013-10-06 ENCOUNTER — Telehealth: Payer: Self-pay

## 2013-10-06 ENCOUNTER — Ambulatory Visit: Payer: Medicare Other

## 2013-10-06 DIAGNOSIS — C159 Malignant neoplasm of esophagus, unspecified: Secondary | ICD-10-CM

## 2013-10-06 MED ORDER — OXYCODONE HCL 5 MG/5ML PO SOLN: 5.0000 mg | ORAL | Status: DC | PRN

## 2013-10-06 NOTE — Telephone Encounter (Signed)
Prescription for Roxicodone with injection nurse for pick up . Patient notified.  She understands that it needs to be picked up by 4pm.

## 2013-10-06 NOTE — Telephone Encounter (Signed)
Ms. Morad stated that she is not using the Duragesic patch as this caused personality changes.  She has been off of it since 10-02-13. She is taking methadone  30 mg every eight hours and Oxycodone liquid 5mg /75ml  10 ml q 4 hours for pain.   Called Ms. Zynda back and left a message asking what is her current pain level with current meds.   She did receive the nexium packets for the j-tube and is appreciative.

## 2013-10-06 NOTE — Telephone Encounter (Signed)
[name] stated that her pain level is a 6-7/10. She has enough methadone until she sees Dr. Darrold Span 13-3-14.  She needs refill on Roxicodone.

## 2013-10-07 ENCOUNTER — Ambulatory Visit: Payer: Medicare Other

## 2013-10-07 ENCOUNTER — Telehealth: Payer: Self-pay

## 2013-10-07 NOTE — Telephone Encounter (Signed)
Faxed signed orders dated 10-06-13 to [facility] alone with Documentation of Face to Face Encounter for Medicare Home Health Patient dated 10-06-13.   Copies of these signed documents were sent to HIM to be scanned into patient's EMR.

## 2013-10-08 ENCOUNTER — Ambulatory Visit: Payer: Medicare Other

## 2013-10-08 ENCOUNTER — Other Ambulatory Visit: Payer: Medicare Other | Admitting: Lab

## 2013-10-08 ENCOUNTER — Ambulatory Visit (HOSPITAL_BASED_OUTPATIENT_CLINIC_OR_DEPARTMENT_OTHER): Payer: Medicare Other

## 2013-10-08 ENCOUNTER — Other Ambulatory Visit: Payer: Self-pay

## 2013-10-08 ENCOUNTER — Other Ambulatory Visit (HOSPITAL_BASED_OUTPATIENT_CLINIC_OR_DEPARTMENT_OTHER): Payer: Medicare Other | Admitting: Lab

## 2013-10-08 ENCOUNTER — Telehealth: Payer: Self-pay | Admitting: *Deleted

## 2013-10-08 ENCOUNTER — Ambulatory Visit (HOSPITAL_BASED_OUTPATIENT_CLINIC_OR_DEPARTMENT_OTHER): Payer: Medicare Other | Admitting: Oncology

## 2013-10-08 ENCOUNTER — Encounter: Payer: Self-pay | Admitting: Oncology

## 2013-10-08 VITALS — BP 128/70 | HR 100 | Temp 97.0°F | Resp 16

## 2013-10-08 VITALS — BP 115/70 | HR 102 | Temp 98.4°F | Resp 18 | Ht 63.0 in | Wt 104.7 lb

## 2013-10-08 DIAGNOSIS — Z5111 Encounter for antineoplastic chemotherapy: Secondary | ICD-10-CM

## 2013-10-08 DIAGNOSIS — I4581 Long QT syndrome: Secondary | ICD-10-CM

## 2013-10-08 DIAGNOSIS — C155 Malignant neoplasm of lower third of esophagus: Secondary | ICD-10-CM

## 2013-10-08 DIAGNOSIS — C159 Malignant neoplasm of esophagus, unspecified: Secondary | ICD-10-CM

## 2013-10-08 DIAGNOSIS — G35 Multiple sclerosis: Secondary | ICD-10-CM

## 2013-10-08 DIAGNOSIS — Z95828 Presence of other vascular implants and grafts: Secondary | ICD-10-CM

## 2013-10-08 LAB — COMPREHENSIVE METABOLIC PANEL (CC13)
AST: 30 U/L (ref 5–34)
Albumin: 2.5 g/dL — ABNORMAL LOW (ref 3.5–5.0)
BUN: 11.8 mg/dL (ref 7.0–26.0)
Calcium: 9.1 mg/dL (ref 8.4–10.4)
Chloride: 95 mEq/L — ABNORMAL LOW (ref 98–109)
Creatinine: 0.5 mg/dL — ABNORMAL LOW (ref 0.6–1.1)
Glucose: 89 mg/dl (ref 70–140)
Potassium: 4.9 mEq/L (ref 3.5–5.1)
Total Bilirubin: 0.35 mg/dL (ref 0.20–1.20)

## 2013-10-08 LAB — CBC WITH DIFFERENTIAL/PLATELET
Basophils Absolute: 0 10*3/uL (ref 0.0–0.1)
EOS%: 0.4 % (ref 0.0–7.0)
Eosinophils Absolute: 0 10*3/uL (ref 0.0–0.5)
HGB: 10 g/dL — ABNORMAL LOW (ref 11.6–15.9)
LYMPH%: 5.2 % — ABNORMAL LOW (ref 14.0–49.7)
MONO#: 0.8 10*3/uL (ref 0.1–0.9)
NEUT#: 7.2 10*3/uL — ABNORMAL HIGH (ref 1.5–6.5)
RDW: 15.5 % — ABNORMAL HIGH (ref 11.2–14.5)
WBC: 8.5 10*3/uL (ref 3.9–10.3)
lymph#: 0.4 10*3/uL — ABNORMAL LOW (ref 0.9–3.3)

## 2013-10-08 MED ORDER — METHADONE HCL 5 MG/5ML PO SOLN: 30.0000 mg | Freq: Three times a day (TID) | ORAL | Status: DC

## 2013-10-08 MED ORDER — NICOTINE 14 MG/24HR TD PT24: 14.0000 mg | MEDICATED_PATCH | Freq: Every day | TRANSDERMAL | Status: AC

## 2013-10-08 MED ORDER — ZOLEDRONIC ACID 4 MG/100ML IV SOLN
4.0000 mg | Freq: Once | INTRAVENOUS | Status: AC
  Administered 2013-10-08: 4 mg via INTRAVENOUS
  Filled 2013-10-08: qty 100

## 2013-10-08 MED ORDER — HEPARIN SOD (PORK) LOCK FLUSH 100 UNIT/ML IV SOLN
500.0000 [IU] | Freq: Once | INTRAVENOUS | Status: AC
Start: 2013-10-08 — End: 2013-10-08
  Administered 2013-10-08: 500 [IU] via INTRAVENOUS
  Filled 2013-10-08: qty 5

## 2013-10-08 MED ORDER — HYDROMORPHONE HCL PF 4 MG/ML IJ SOLN
INTRAMUSCULAR | Status: AC
  Filled 2013-10-08: qty 1

## 2013-10-08 MED ORDER — SODIUM CHLORIDE 0.9 % IJ SOLN
10.0000 mL | INTRAMUSCULAR | Status: DC | PRN
  Administered 2013-10-08: 10 mL via INTRAVENOUS
  Filled 2013-10-08: qty 10

## 2013-10-08 MED ORDER — HYDROMORPHONE HCL PF 4 MG/ML IJ SOLN
0.5000 mg | Freq: Once | INTRAMUSCULAR | Status: AC
  Administered 2013-10-08: 0.5 mg via INTRAVENOUS

## 2013-10-08 NOTE — Progress Notes (Signed)
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 She is on tube feedings by jejunostomy for about last 3 weeks, tolerating 40 cc/ hour continuous now with goal of 60 cc/hr, and tolerating ~ 100-120 cc free water with each flush. There is some problem with leakage from connector for the J tube, [facility] to check on this later today. She is able to swallow some liquids. Pain is across upper right abdomen primarily, controlled with methadone 30 mg q 8 hrs (liquid per J tube best) and oxycodone 10 mg q 4 hrs prn. Duragesic was discontinued after evaluation in ED, as she seemed not to tolerate that. She has felt stronger overall, able to be up some at home. Bowels are moving, she has had no fever and no increased SOB or cough. J tube is more comfortable when secured so the suture is not pulling. She will have zometa today and will use gatorade per J tube and po over next 24 hours due to fatigue symptoms after first zometa treatment last month. Plan is to begin FOLFOX in palliative attempt on 10-13-13. She has PAC.  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 Prior upper endoscopy 3 years ago was normal. Recently she developed difficulty swallowing and pain from epigastrium to LLQ. EGD by Dr [name] in [place] found a stenosed area in distal esophagus which was friable, with abnormal mucosa there and in gastric lumen. [facility] showed adenocarcinomaHER 2 was 2+ by immunohistochemical staining,  with FISH negative ([facility] addendum from 24 Sept 2014 accession # [id number]). She was hospitalized in [facility] system 9-23 thru 08-01-13 with pneumonia, with SOB, hypoxemia and dizziness, also hypokalemic. CT CAP 07-31-13 had extensive ground glass opacities in lungs with diffuse peribronchial thickening, esophageal dilatation with confluent soft tissue density involving distal esophagus and GE junction/ adjacent stomach, enlarged AP window node and prominent subcarinal node, mildly enlarged nodes retroperitoneum and root of mesentery, nodal mass 5.4 x 2.7 cm encasing celiac axis and proximal superior mesenteric artery, diffuse hepatic steatosis and bilateral adrenal hyperplasia. Endoscopic ultrasound by Dr [name] 08-08-13 documented stenosing and friable mass from 40 cm extending 5 cm into proximal stomach with maximal depth of invasion 28mm, extending beyond the adventitia; two large lymph nodes were identified, measuring 10mm, however celiac axis area could not be evaluated, this felt to be T4N2 distal esophageal/ proximal gastric tumor. MRI head 08-04-13 was unremarkable. PET 08-19-13 showed primary distal esophagus/ GE junction with involved nodes at gastrohepatic ligament and celiac axis as well as left periaortics, and bony mets T7, T11 and possibly T9 and T10. She received 30 Gy to distal esophagus/GE junction/ T9-11 by Dr Basilio Cairo from 10-20 thru 09-05-13. She was admitted 11-7 thru 09-20-13 prior to availability of G tube placement, with diffuse tumor involvement at stomach such that G tube was impossible and jejunostomy done instead, by Dr Derrell Lolling 09-17-13. There were numerous delays getting tube feedings started after DC, but were begun ~ 09-24-13.   Review of systems as above, also: No aspiration symptoms. Able to sleep ~ 2-3 hours at  a time. No LE swelling. PAC ok. Remainder of 10 point Review of Systems negative.  Objective:  Vital signs in last 24 hours:  BP 115/70  Pulse 102   Temp(Src) 98.4 F (36.9 C) (Oral)  Resp 18  Ht 5\' 3"  (1.6 m)  Wt 104 lb 11.2 oz (47.492 kg)  BMI 18.55 kg/m2  LMP 04/28/2011 Weight is down ~ 1 lb. She looks much better overall today, including much better hydrated, brighter and more comfortable, tho still obviously ill and in [facility] for visit. Alert, oriented and appropriate.  HEENT:PERRL, sclerae not icteric. Oral mucosa moist without lesions, posterior pharynx clear.  Neck supple. No JVD.  Lymphatics:no cervical,suraclavicular adenopathy Resp:diminished breath sounds otherwise clear to auscultation bilaterally. No use of accessory muscles Cardio tachy,regular rate and rhythm. No gallop. Clear heart sounds GI: soft, not distended, no mass or organomegaly. Bowel sounds present. Jejunostomy tube secured, dressing dry. Vaguely tender across right abdomen just below costal margin, not at J tube. Musculoskeletal/ Extremities: without pitting edema, cords, tenderness Neuro: nonfocal on exam in WC (note has MS) Skin without rash, ecchymosis, petechiae Portacath-without erythema or tenderness  Lab Results:  Results for orders placed in visit on 10/08/13  CBC WITH DIFFERENTIAL      Result Value Range   WBC 8.5  3.9 - 10.3 10e3/uL   NEUT# 7.2 (*) 1.5 - 6.5 10e3/uL   HGB 10.0 (*) 11.6 - 15.9 g/dL   HCT 96.0 (*) 45.4 - 09.8 %   Platelets 422 (*) 145 - 400 10e3/uL   MCV 95.8  79.5 - 101.0 fL   MCH 31.7  25.1 - 34.0 pg   MCHC 33.1  31.5 - 36.0 g/dL   RBC 1.19 (*) 1.47 - 8.29 10e6/uL   RDW 15.5 (*) 11.2 - 14.5 %   lymph# 0.4 (*) 0.9 - 3.3 10e3/uL   MONO# 0.8  0.1 - 0.9 10e3/uL   Eosinophils Absolute 0.0  0.0 - 0.5 10e3/uL   Basophils Absolute 0.0  0.0 - 0.1 10e3/uL   NEUT% 84.0 (*) 38.4 - 76.8 %   LYMPH% 5.2 (*) 14.0 - 49.7 %   MONO% 9.9  0.0 - 14.0 %   EOS% 0.4  0.0 - 7.0 %   BASO% 0.5  0.0 - 2.0 %  COMPREHENSIVE METABOLIC PANEL (CC13)      Result Value Range   Sodium 130 (*) 136 - 145 mEq/L   Potassium 4.9  3.5 - 5.1 mEq/L    Chloride 95 (*) 98 - 109 mEq/L   CO2 25  22 - 29 mEq/L   Glucose 89  70 - 140 mg/dl   BUN 56.2  7.0 - 13.0 mg/dL   Creatinine 0.5 (*) 0.6 - 1.1 mg/dL   Total Bilirubin 8.65  0.20 - 1.20 mg/dL   Alkaline Phosphatase 180 (*) 40 - 150 U/L   AST 30  5 - 34 U/L   ALT 23  0 - 55 U/L   Total Protein 6.8  6.4 - 8.3 g/dL   Albumin 2.5 (*) 3.5 - 5.0 g/dL   Calcium 9.1  8.4 - 78.4 mg/dL   Anion Gap 9  3 - 11 mEq/L     Studies/Results:  No results found.  Medications: I have reviewed the patient's current medications. Chemistries available after visit and will decrease K+ to 20 mEq once daily instead of bid. Continue methadone (prescriptions now from this office) tid and oxycodone liquid prn. #2 zometa today. ODT zofran most helpful for nausea and  she has this available for chemotherapy upcoming.  DISCUSSION: She is in agreement with beginning FOLFOX on 10-13-13. She is aware that I will decrease doses for initial cycle given overall condition. She will have additional IVF at this office when she has pump DC on day 3.  Assessment/Plan:  1. 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 2 chronic pancreatitis since cholecystectomy 1993, better in past 2 years post sphincterotomy and stents  3. On methadone maintenance for drug history, Dr Betti Cruz aware of recent cancer diagnosis.  4.long tobacco recently DCd  5. PAC placed  6.post treatment of Graves disease, now on replacement for treatment induced hypothyroidism  7.hx migraines but tolerates zofran  8.flu vaccine done  9.multiple sclerosis not more symptomatic presently  10.long QT syndrome  11.hypokalemia: improved, decrease supplementation and follow  12. Interstitial pneumonia treated during hospitalization in Sept and radiographic pneumonia also treated during hospitalization in Nov. No symptoms of pulmonary  infection now. 13. Code status DNR at patient's request    Patient and mother have had questions answered to their satisfaction and are in agreement with plan above.   Leily Capek P, MD   10/08/2013, 3:26 PM

## 2013-10-08 NOTE — Patient Instructions (Signed)

## 2013-10-08 NOTE — Patient Instructions (Signed)
Increase gatorade for 24 hours after zometa  Begin carafate liquid 1 gm by mouth 4x daily

## 2013-10-08 NOTE — Telephone Encounter (Signed)
appts made and printed. Pt is aware that tx will be added. i emailed MW to add the tx's...td 

## 2013-10-08 NOTE — Patient Instructions (Signed)
Implanted Port Instructions  An implanted port is a central line that has a round shape and is placed under the skin. It is used for long-term IV (intravenous) access for:  · Medicine.  · Fluids.  · Liquid nutrition, such as TPN (total parenteral nutrition).  · Blood samples.  Ports can be placed:  · In the chest area just below the collarbone (this is the most common place.)  · In the arms.  · In the belly (abdomen) area.  · In the legs.  PARTS OF THE PORT  A port has 2 main parts:  · The reservoir. The reservoir is round, disc-shaped, and will be a small, raised area under your skin.  · The reservoir is the part where a needle is inserted (accessed) to either give medicines or to draw blood.  · The catheter. The catheter is a long, slender tube that extends from the reservoir. The catheter is placed into a large vein.  · Medicine that is inserted into the reservoir goes into the catheter and then into the vein.  INSERTION OF THE PORT  · The port is surgically placed in either an operating room or in a procedural area (interventional radiology).  · Medicine may be given to help you relax during the procedure.  · The skin where the port will be inserted is numbed (local anesthetic).  · 1 or 2 small cuts (incisions) will be made in the skin to insert the port.  · The port can be used after it has been inserted.  INCISION SITE CARE  · The incision site may have small adhesive strips on it. This helps keep the incision site closed. Sometimes, no adhesive strips are placed. Instead of adhesive strips, a special kind of surgical glue is used to keep the incision closed.  · If adhesive strips were placed on the incision sites, do not take them off. They will fall off on their own.  · The incision site may be sore for 1 to 2 days. Pain medicine can help.  · Do not get the incision site wet. Bathe or shower as directed by your caregiver.  · The incision site should heal in 5 to 7 days. A small scar may form after the  incision has healed.  ACCESSING THE PORT  Special steps must be taken to access the port:  · Before the port is accessed, a numbing cream can be placed on the skin. This helps numb the skin over the port site.  · A sterile technique is used to access the port.  · The port is accessed with a needle. Only "non-coring" port needles should be used to access the port. Once the port is accessed, a blood return should be checked. This helps ensure the port is in the vein and is not clogged (clotted).  · If your caregiver believes your port should remain accessed, a clear (transparent) bandage will be placed over the needle site. The bandage and needle will need to be changed every week or as directed by your caregiver.  · Keep the bandage covering the needle clean and dry. Do not get it wet. Follow your caregiver's instructions on how to take a shower or bath when the port is accessed.  · If your port does not need to stay accessed, no bandage is needed over the port.  FLUSHING THE PORT  Flushing the port keeps it from getting clogged. How often the port is flushed depends on:  · If a   constant infusion is running. If a constant infusion is running, the port may not need to be flushed.  · If intermittent medicines are given.  · If the port is not being used.  For intermittent medicines:  · The port will need to be flushed:  · After medicines have been given.  · After blood has been drawn.  · As part of routine maintenance.  · A port is normally flushed with:  · Normal saline.  · Heparin.  · Follow your caregiver's advice on how often, how much, and the type of flush to use on your port.  IMPORTANT PORT INFORMATION  · Tell your caregiver if you are allergic to heparin.  · After your port is placed, you will get a manufacturer's information card. The card has information about your port. Keep this card with you at all times.  · There are many types of ports available. Know what kind of port you have.  · In case of an  emergency, it may be helpful to wear a medical alert bracelet. This can help alert health care workers that you have a port.  · The port can stay in for as long as your caregiver believes it is necessary.  · When it is time for the port to come out, surgery will be done to remove it. The surgery will be similar to how the port was put in.  · If you are in the hospital or clinic:  · Your port will be taken care of and flushed by a nurse.  · If you are at home:  · A home health care nurse may give medicines and take care of the port.  · You or a family member can get special training and directions for giving medicine and taking care of the port at home.  SEEK IMMEDIATE MEDICAL CARE IF:   · Your port does not flush or you are unable to get a blood return.  · New drainage or pus is coming from the incision.  · A bad smell is coming from the incision site.  · You develop swelling or increased redness at the incision site.  · You develop increased swelling or pain at the port site.  · You develop swelling or pain in the surrounding skin near the port.  · You have an oral temperature above 102° F (38.9° C), not controlled by medicine.  MAKE SURE YOU:   · Understand these instructions.  · Will watch your condition.  · Will get help right away if you are not doing well or get worse.  Document Released: 10/23/2005 Document Revised: 01/15/2012 Document Reviewed: 01/14/2009  [employer]® Patient Information ©2014 [employer].

## 2013-10-09 ENCOUNTER — Telehealth: Payer: Self-pay

## 2013-10-09 ENCOUNTER — Other Ambulatory Visit: Payer: Self-pay | Admitting: Oncology

## 2013-10-09 ENCOUNTER — Ambulatory Visit: Payer: Medicare Other

## 2013-10-09 ENCOUNTER — Telehealth: Payer: Self-pay | Admitting: *Deleted

## 2013-10-09 MED ORDER — SODIUM CHLORIDE 0.9 % IV SOLN: INTRAVENOUS | Status: DC

## 2013-10-09 MED ORDER — ONDANSETRON 16 MG/50ML IVPB (CHCC): 16.0000 mg | Freq: Once | INTRAVENOUS | Status: DC | PRN

## 2013-10-09 NOTE — Telephone Encounter (Signed)
Left a message for [name] directing her to decrease potassium as noted below by Dr. Darrold Span.

## 2013-10-09 NOTE — Telephone Encounter (Signed)
Per staff message and POF I have scheduled appts.  JMW  

## 2013-10-09 NOTE — Telephone Encounter (Signed)
Message copied by Lorine Bears on Thu Oct 09, 2013 11:00 AM ------      Message from: Reece Packer      Created: Wed Oct 08, 2013  9:25 PM       Labs seen and need follow up: K+ 4.9. Can decrease potassium to once daily now ------

## 2013-10-10 ENCOUNTER — Ambulatory Visit: Payer: Medicare Other

## 2013-10-12 ENCOUNTER — Other Ambulatory Visit: Payer: Self-pay | Admitting: Oncology

## 2013-10-13 ENCOUNTER — Other Ambulatory Visit: Payer: Self-pay | Admitting: Oncology

## 2013-10-13 ENCOUNTER — Other Ambulatory Visit: Payer: Self-pay

## 2013-10-13 ENCOUNTER — Ambulatory Visit: Payer: Medicare Other | Admitting: Nutrition

## 2013-10-13 ENCOUNTER — Ambulatory Visit (HOSPITAL_BASED_OUTPATIENT_CLINIC_OR_DEPARTMENT_OTHER): Payer: Medicare Other | Admitting: Oncology

## 2013-10-13 ENCOUNTER — Ambulatory Visit (HOSPITAL_BASED_OUTPATIENT_CLINIC_OR_DEPARTMENT_OTHER): Payer: Medicare Other

## 2013-10-13 VITALS — BP 117/67 | HR 98 | Temp 97.6°F | Resp 20

## 2013-10-13 DIAGNOSIS — I4581 Long QT syndrome: Secondary | ICD-10-CM

## 2013-10-13 DIAGNOSIS — C7951 Secondary malignant neoplasm of bone: Secondary | ICD-10-CM

## 2013-10-13 DIAGNOSIS — Z5111 Encounter for antineoplastic chemotherapy: Secondary | ICD-10-CM

## 2013-10-13 DIAGNOSIS — C155 Malignant neoplasm of lower third of esophagus: Secondary | ICD-10-CM

## 2013-10-13 DIAGNOSIS — L539 Erythematous condition, unspecified: Secondary | ICD-10-CM

## 2013-10-13 DIAGNOSIS — C778 Secondary and unspecified malignant neoplasm of lymph nodes of multiple regions: Secondary | ICD-10-CM

## 2013-10-13 DIAGNOSIS — E038 Other specified hypothyroidism: Secondary | ICD-10-CM

## 2013-10-13 DIAGNOSIS — C159 Malignant neoplasm of esophagus, unspecified: Secondary | ICD-10-CM

## 2013-10-13 DIAGNOSIS — Z452 Encounter for adjustment and management of vascular access device: Secondary | ICD-10-CM

## 2013-10-13 MED ORDER — ALTEPLASE 2 MG IJ SOLR
2.0000 mg | Freq: Once | INTRAMUSCULAR | Status: AC | PRN
  Administered 2013-10-13: 2 mg
  Filled 2013-10-13: qty 2

## 2013-10-13 MED ORDER — ONDANSETRON 8 MG/NS 50 ML IVPB
INTRAVENOUS | Status: AC
  Filled 2013-10-13: qty 8

## 2013-10-13 MED ORDER — DEXAMETHASONE SODIUM PHOSPHATE 10 MG/ML IJ SOLN
10.0000 mg | Freq: Once | INTRAMUSCULAR | Status: AC
  Administered 2013-10-13: 10 mg via INTRAVENOUS

## 2013-10-13 MED ORDER — FLUOROURACIL CHEMO INJECTION 500 MG/10ML
200.0000 mg/m2 | Freq: Once | INTRAVENOUS | Status: AC
  Administered 2013-10-13: 300 mg via INTRAVENOUS
  Filled 2013-10-13: qty 6

## 2013-10-13 MED ORDER — DEXTROSE 5 % IV SOLN
Freq: Once | INTRAVENOUS | Status: AC
  Administered 2013-10-13: 10:00:00 via INTRAVENOUS

## 2013-10-13 MED ORDER — OXALIPLATIN CHEMO INJECTION 100 MG/20ML
42.5000 mg/m2 | Freq: Once | INTRAVENOUS | Status: AC
  Administered 2013-10-13: 60 mg via INTRAVENOUS
  Filled 2013-10-13: qty 12

## 2013-10-13 MED ORDER — DEXAMETHASONE SODIUM PHOSPHATE 10 MG/ML IJ SOLN
INTRAMUSCULAR | Status: AC
  Filled 2013-10-13: qty 1

## 2013-10-13 MED ORDER — LEUCOVORIN CALCIUM INJECTION 350 MG
200.0000 mg/m2 | Freq: Once | INTRAVENOUS | Status: AC
  Administered 2013-10-13: 290 mg via INTRAVENOUS
  Filled 2013-10-13: qty 14.5

## 2013-10-13 MED ORDER — OXYCODONE HCL 5 MG/5ML PO SOLN: 5.0000 mg | ORAL | Status: DC | PRN

## 2013-10-13 MED ORDER — METHADONE HCL 5 MG/5ML PO SOLN: 30.0000 mg | Freq: Three times a day (TID) | ORAL | Status: DC

## 2013-10-13 MED ORDER — METHADONE HCL 10 MG/ML PO CONC
30.0000 mg | Freq: Once | ORAL | Status: AC
  Administered 2013-10-13: 30 mg
  Filled 2013-10-13: qty 3

## 2013-10-13 MED ORDER — SODIUM CHLORIDE 0.9 % IV SOLN
1200.0000 mg/m2 | INTRAVENOUS | Status: DC
  Administered 2013-10-13: 1750 mg via INTRAVENOUS
  Filled 2013-10-13: qty 35

## 2013-10-13 MED ORDER — METHADONE HCL 10 MG PO TABS: 30.0000 mg | ORAL_TABLET | Freq: Three times a day (TID) | ORAL | Status: DC

## 2013-10-13 MED ORDER — ONDANSETRON 8 MG/50ML IVPB (CHCC)
8.0000 mg | Freq: Once | INTRAVENOUS | Status: AC
  Administered 2013-10-13: 8 mg via INTRAVENOUS

## 2013-10-13 NOTE — Progress Notes (Signed)
I followed up with patient and mother in chemotherapy.  Patient is reporting difficulty with her feeding jejunostomy tube and the connections needed to use continuous jejunostomy feedings.  Per patient, the connector on her feeding tube leaks and does not fit tightly.  She reports it worked well for the first week.  Her home health company, [facility], has been aware, and nursing was attempting to fix connections.  However, patient is still struggling with the mechanics of the tube feeding.  She reports she is tolerating Osmolite 1.2 at 40-50 mL an hour.  She states she sometimes gets nauseous and will turn back the rate of the feeding.  She understands goal rate is 60 mL an hour over 24 hours.  Weight was documented as 104.7 pounds December 4.  Decreased from 106.5 pounds November 21.  Nutrition diagnosis: Unintended weight loss continues.  Intervention: I placed a call to [facility] to notify them of tube feeding equipment issues.  I spoke with Marcelino Duster, who will followup with patient.  Patient educated to increase Osmolite 1.2 to goal rate of 60 mL an hour for 24 hours daily.  Tube feeding at goal will provide 100% of estimated nutrition needs.  6 cans of Osmolite 1.2 provides 1728 calories, 80 g protein, 1901 mL free water.  Monitoring, evaluation, goals: Patient will increase tube feedings to meet 100% of estimated needs to minimize further weight loss and improve repletion of lean body mass.  Next visit: Wednesday: December 10, in the infusion room.

## 2013-10-13 NOTE — Patient Instructions (Signed)
[facility] Discharge Instructions for Patients Receiving Chemotherapy  Today you received the following chemotherapy agents: Oxaliplain, Leucovorin, Fluorouracil.  To help prevent nausea and vomiting after your treatment, we encourage you to take your nausea medication, Zofran. Take one every 8 hours as needed for nausea.   If you develop nausea and vomiting that is not controlled by your nausea medication, call the clinic.   BELOW ARE SYMPTOMS THAT SHOULD BE REPORTED IMMEDIATELY:  *FEVER GREATER THAN 100.5 F  *CHILLS WITH OR WITHOUT FEVER  NAUSEA AND VOMITING THAT IS NOT CONTROLLED WITH YOUR NAUSEA MEDICATION  *UNUSUAL SHORTNESS OF BREATH  *UNUSUAL BRUISING OR BLEEDING  TENDERNESS IN MOUTH AND THROAT WITH OR WITHOUT PRESENCE OF ULCERS  *URINARY PROBLEMS  *BOWEL PROBLEMS  UNUSUAL RASH Items with * indicate a potential emergency and should be followed up as soon as possible.  Feel free to call the clinic should you have any questions or concerns. The clinic phone number is [phone].

## 2013-10-14 ENCOUNTER — Encounter: Payer: Self-pay | Admitting: Oncology

## 2013-10-14 ENCOUNTER — Telehealth: Payer: Self-pay | Admitting: *Deleted

## 2013-10-14 NOTE — Telephone Encounter (Signed)
2nd attempt to reach patient-left VM

## 2013-10-14 NOTE — Progress Notes (Signed)
OFFICE PROGRESS NOTE   10/13/2013   Physicians:Patrick Felix Ahmadi (PCP), Lonie Peak, Claud Kelp, Bindubal Balan, Trudie Buckler ([place], neurology), Reddy/ Dartha Lodge NP ([facility]), K.Hilty    INTERVAL HISTORY:  Patient is seen in infusion area as a work in visit, due to erythema and tenderness in feet for past 24 hours, which she states was related to use of lotion samples; she is not able to tell me type of lotion, but will let us know that.  She has taken benadryl with some improvement in feet and resolution of similar changes on hands and nose (also thought from contact with the lotion). She has had no fever and no pain in feet. 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 PAC apparently some difficulty initially in infusion today, which was resolved.  Patient has also been seen by [facility] dietician today, as she is still not up to goal rate on continuous tube feedings and is still having problems with equipment. [facility] is to follow up.  RN has spent extended time coordinating with pharmacies today re availability of methadone liquid and tablets  (including turnaround time from suppliers and doses available) and oxycodone liquid and appropriate prescriptions, all discussed with MD. Patient's mother has been given written and oral instructions for taking prescriptions to correct pharmacies today.   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 Prior upper endoscopy 3 years ago was normal. Recently she developed difficulty swallowing and pain from epigastrium to LLQ. EGD by Dr [name] in [place]  found a stenosed area in distal esophagus which was friable, with abnormal mucosa there and in gastric lumen. Pathology from [facility] showed adenocarcinomaHER 2 was 2+ by immunohistochemical staining, with FISH negative ([facility] addendum from 24 Sept 2014 accession # [id number]). She was hospitalized in [facility] system 9-23 thru 08-01-13 with pneumonia, with SOB, hypoxemia and dizziness, also hypokalemic. CT CAP 07-31-13 had extensive ground glass opacities in lungs with diffuse peribronchial thickening, esophageal dilatation with confluent soft tissue density involving distal esophagus and GE junction/ adjacent stomach, enlarged AP window node and prominent subcarinal node, mildly enlarged nodes retroperitoneum and root of mesentery, nodal mass 5.4 x 2.7 cm encasing celiac axis and proximal superior mesenteric artery, diffuse hepatic steatosis and bilateral adrenal hyperplasia. Endoscopic ultrasound by Dr [name] 08-08-13 documented stenosing and friable mass from 40 cm extending 5 cm into proximal stomach with maximal depth of invasion 28mm, extending beyond the adventitia; two large lymph nodes were identified, measuring 10mm, however celiac axis area could not be evaluated, this felt to be T4N2 distal esophageal/ proximal gastric tumor. MRI head 08-04-13 was unremarkable. PET 08-19-13 showed primary distal esophagus/ GE junction with involved nodes at gastrohepatic ligament and celiac axis as well as left periaortics, and bony mets T7, T11 and possibly T9 and T10. She received 30 Gy to distal esophagus/GE junction/ T9-11 by Dr Basilio Cairo from 10-20 thru 09-05-13. She was admitted 11-7 thru 09-20-13 prior to availability of G tube placement, with diffuse tumor involvement at stomach such that G tube was impossible and jejunostomy done instead, by Dr Derrell Lolling 09-17-13. There were numerous delays getting tube feedings started after DC, but were  begun ~ 09-24-13. Patient appeared improved enough to  carefully begin FOLFOX on 10-13-2013.   Review of systems as above, also: Complains of baseline pain back and upper abdomen in infusion area, again did not bring pain medication to this visit. No increased SOB. No overt bleeding. No problems with PAC otherwise. Remainder of 10 point Review of Systems negative.  Objective:  Vital signs in last 24 hours: 117/76, 98 regular, 20 not labored RA, 97.6.  Pale, looks chronically ill but NAD seated in recliner in infusion, PAC accessed. Respirations not labored on room air. Alert, oriented and appropriate conversation. Mother supportive.   HEENT: oral mucosa not markedly dry,PERRL, sclerae not icteric. Musculoskeletal/ Extremities: without pitting edema, cords or tenderness. Feet somewhat puffy L>R, warm but not hot. Skin on distal feet and toes moderately erythematous L>R, dry including heels, no streaking to proximal feet or legs, no other rash/ ecchymosis/ no petechiae. No erythema or rash hands or face. Jejunostomy tube dressed, dry. Abdomen soft, not distended. Portacath-without erythema or tenderness Moves all extremities easily. Lab Results: From 10-08-13 Results for orders placed in visit on 10/08/13  CBC WITH DIFFERENTIAL      Result Value Range   WBC 8.5  3.9 - 10.3 10e3/uL   NEUT# 7.2 (*) 1.5 - 6.5 10e3/uL   HGB 10.0 (*) 11.6 - 15.9 g/dL   HCT 16.1 (*) 09.6 - 04.5 %   Platelets 422 (*) 145 - 400 10e3/uL   MCV 95.8  79.5 - 101.0 fL   MCH 31.7  25.1 - 34.0 pg   MCHC 33.1  31.5 - 36.0 g/dL   RBC 4.09 (*) 8.11 - 9.14 10e6/uL   RDW 15.5 (*) 11.2 - 14.5 %   lymph# 0.4 (*) 0.9 - 3.3 10e3/uL   MONO# 0.8  0.1 - 0.9 10e3/uL   Eosinophils Absolute 0.0  0.0 - 0.5 10e3/uL   Basophils Absolute 0.0  0.0 - 0.1 10e3/uL   NEUT% 84.0 (*) 38.4 - 76.8 %   LYMPH% 5.2 (*) 14.0 - 49.7 %   MONO% 9.9  0.0 - 14.0 %   EOS% 0.4  0.0 - 7.0 %   BASO% 0.5  0.0 - 2.0 %  COMPREHENSIVE METABOLIC PANEL ([facility]13)      Result Value Range   Sodium 130 (*) 136  - 145 mEq/L   Potassium 4.9  3.5 - 5.1 mEq/L   Chloride 95 (*) 98 - 109 mEq/L   CO2 25  22 - 29 mEq/L   Glucose 89  70 - 140 mg/dl   BUN 78.2  7.0 - 95.6 mg/dL   Creatinine 0.5 (*) 0.6 - 1.1 mg/dL   Total Bilirubin 2.13  0.20 - 1.20 mg/dL   Alkaline Phosphatase 180 (*) 40 - 150 U/L   AST 30  5 - 34 U/L   ALT 23  0 - 55 U/L   Total Protein 6.8  6.4 - 8.3 g/dL   Albumin 2.5 (*) 3.5 - 5.0 g/dL   Calcium 9.1  8.4 - 08.6 mg/dL   Anion Gap 9  3 - 11 mEq/L     Studies/Results:  No results found.  Medications: I have reviewed the patient's current medications.  Prescriptions given for methadone liquid ([facility], should be available from supplier in ~ 72 hrs), methadone tablets (from [facility], to last until methadone liquid is available), and oxycodone for breakthru directions as previously. One dose methadone dispensed from [facility] pharmacy today per my discussion with  [facility] pharmacist.    Assessment/Plan:  1. 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 FOLFOX in palliative attempt begun today (10-13-13); she will have additional IVF when she comes for pump DC on 10-15-13. She has f/u with radiation oncology 12-11 and will see me with labs 12-15. 2 chronic pancreatitis since cholecystectomy 1993, better in past 2 years post sphincterotomy and stents  3. On methadone maintenance for drug history: prescriptions for the methadone from this office now rather than from Dr Betti Cruz. Liquid preferable for J tube administration if that is available. 4. Erythema of distal feet, possibly sensitivity to new lotion. Avoid further lotion, prn benadryl ok, will follow. 5. 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 Interstitial pneumonia treated during hospitalization in Sept and radiographic pneumonia also treated during hospitalization in Nov. No symptoms of pulmonary infection now.  13. long tobacco recently DCd 14.Code status DNR at patient's request  We are trying FOLFOX in palliative attempt in this relatively young woman, but if unable to tolerate or if not helpful she will be Hospice appropriate.  Time spent 25 min including >50% discussion andcoordination of care    Reece Packer, MD

## 2013-10-15 ENCOUNTER — Ambulatory Visit: Payer: Medicare Other | Admitting: Nutrition

## 2013-10-15 ENCOUNTER — Ambulatory Visit: Payer: Medicare Other

## 2013-10-15 ENCOUNTER — Ambulatory Visit (HOSPITAL_BASED_OUTPATIENT_CLINIC_OR_DEPARTMENT_OTHER): Payer: Medicare Other

## 2013-10-15 ENCOUNTER — Telehealth: Payer: Self-pay

## 2013-10-15 VITALS — BP 110/64 | HR 92 | Temp 98.1°F | Resp 18

## 2013-10-15 DIAGNOSIS — Z5189 Encounter for other specified aftercare: Secondary | ICD-10-CM

## 2013-10-15 DIAGNOSIS — C159 Malignant neoplasm of esophagus, unspecified: Secondary | ICD-10-CM

## 2013-10-15 DIAGNOSIS — C155 Malignant neoplasm of lower third of esophagus: Secondary | ICD-10-CM

## 2013-10-15 MED ORDER — SODIUM CHLORIDE 0.9 % IJ SOLN
10.0000 mL | INTRAMUSCULAR | Status: DC | PRN
  Administered 2013-10-15: 10 mL
  Filled 2013-10-15: qty 10

## 2013-10-15 MED ORDER — ONDANSETRON 8 MG/NS 50 ML IVPB
INTRAVENOUS | Status: AC
  Filled 2013-10-15: qty 8

## 2013-10-15 MED ORDER — ONDANSETRON 16 MG/50ML IVPB (CHCC)
16.0000 mg | Freq: Once | INTRAVENOUS | Status: AC | PRN
  Administered 2013-10-15: 16 mg via INTRAVENOUS

## 2013-10-15 MED ORDER — SODIUM CHLORIDE 0.9 % IV SOLN
1000.0000 mL | Freq: Once | INTRAVENOUS | Status: AC
  Administered 2013-10-15: 1000 mL via INTRAVENOUS

## 2013-10-15 MED ORDER — HEPARIN SOD (PORK) LOCK FLUSH 100 UNIT/ML IV SOLN
500.0000 [IU] | Freq: Once | INTRAVENOUS | Status: AC | PRN
  Administered 2013-10-15: 500 [IU]
  Filled 2013-10-15: qty 5

## 2013-10-15 MED ORDER — ONDANSETRON 16 MG/50ML IVPB (CHCC)
INTRAVENOUS | Status: AC
  Filled 2013-10-15: qty 16

## 2013-10-15 NOTE — Telephone Encounter (Signed)
Faxed signed orders dated 10-13-13 to [facility].  Sent a copy to HIM to be scanned into patient's EMR.

## 2013-10-15 NOTE — Patient Instructions (Signed)
Dehydration, Adult Dehydration is when you lose more fluids from the body than you take in. Vital organs like the kidneys, brain, and heart cannot function without a proper amount of fluids and salt. Any loss of fluids from the body can cause dehydration.  CAUSES   Vomiting.  Diarrhea.  Excessive sweating.  Excessive urine output.  Fever. SYMPTOMS  Mild dehydration  Thirst.  Dry lips.  Slightly dry mouth. Moderate dehydration  Very dry mouth.  Sunken eyes.  Skin does not bounce back quickly when lightly pinched and released.  Dark urine and decreased urine production.  Decreased tear production.  Headache. Severe dehydration  Very dry mouth.  Extreme thirst.  Rapid, weak pulse (more than 100 beats per minute at rest).  Cold hands and feet.  Not able to sweat in spite of heat and temperature.  Rapid breathing.  Blue lips.  Confusion and lethargy.  Difficulty being awakened.  Minimal urine production.  No tears. DIAGNOSIS  Your caregiver will diagnose dehydration based on your symptoms and your exam. Blood and urine tests will help confirm the diagnosis. The diagnostic evaluation should also identify the cause of dehydration. TREATMENT  Treatment of mild or moderate dehydration can often be done at home by increasing the amount of fluids that you drink. It is best to drink small amounts of fluid more often. Drinking too much at one time can make vomiting worse. Refer to the home care instructions below. Severe dehydration needs to be treated at the hospital where you will probably be given intravenous (IV) fluids that contain water and electrolytes. HOME CARE INSTRUCTIONS   Ask your caregiver about specific rehydration instructions.  Drink enough fluids to keep your urine clear or pale yellow.  Drink small amounts frequently if you have nausea and vomiting.  Eat as you normally do.  Avoid:  Foods or drinks high in sugar.  Carbonated  drinks.  Juice.  Extremely hot or cold fluids.  Drinks with caffeine.  Fatty, greasy foods.  Alcohol.  Tobacco.  Overeating.  Gelatin desserts.  Wash your hands well to avoid spreading bacteria and viruses.  Only take over-the-counter or prescription medicines for pain, discomfort, or fever as directed by your caregiver.  Ask your caregiver if you should continue all prescribed and over-the-counter medicines.  Keep all follow-up appointments with your caregiver. SEEK MEDICAL CARE IF:  You have abdominal pain and it increases or stays in one area (localizes).  You have a rash, stiff neck, or severe headache.  You are irritable, sleepy, or difficult to awaken.  You are weak, dizzy, or extremely thirsty. SEEK IMMEDIATE MEDICAL CARE IF:   You are unable to keep fluids down or you get worse despite treatment.  You have frequent episodes of vomiting or diarrhea.  You have blood or green matter (bile) in your vomit.  You have blood in your stool or your stool looks black and tarry.  You have not urinated in 6 to 8 hours, or you have only urinated a small amount of very dark urine.  You have a fever.  You faint. MAKE SURE YOU:   Understand these instructions.  Will watch your condition.  Will get help right away if you are not doing well or get worse. Document Released: 10/23/2005 Document Revised: 01/15/2012 Document Reviewed: 06/12/2011 [employer] Patient Information 2014 [employer].  

## 2013-10-15 NOTE — Progress Notes (Signed)
I spoke with patient in chemotherapy.  She continues to be innovative with her feeding jejunostomy tube and the connections needed for continuous jejunostomy feedings.  She reports [facility] evaluated the adapter required.  They will visit the home later today and bring the appropriate connector.  Patient reports she is currently receiving Osmolite 1.2 at 50 mL an hour with 30 mL of free water flushes every hour over 24 hours.  No new weight has been documented.  Tube feeding to goal rate 60 mL an hour.  Nutrition diagnosis: Unintended weight loss cannot be evaluated.  Intervention: Patient agreeable to increasing Osmolite 1.2 to goal rate of 60 mL an hour for 24 hours daily along with 30 mL free water flushes every hour delivered by jejunostomy feeding.  6 cans of Osmolite 1.2, along with free water flushes provide 1728 calories, 80 g protein, 1901 mL free water.  Monitoring, evaluation, goals: Patient will tolerate tube feeding at goal rate to meet 100% of estimated nutrition needs.  Next visit: December 22, during chemotherapy.

## 2013-10-15 NOTE — Telephone Encounter (Signed)
Ms. Trang needs Methadone liquid for J- tube.  It take 72 hrs for pharmacy to order and get med in for patient.  No transactions on the weekends so a prescription on Friday many not be processed until Monday and then 72 hours from then medication will be received. [facility] pharmacy has methadone tabs but their wholesaler cannot get liquid form. [facility] can get [facility]adone [facility] and their wholesaler at this time can get methadone liquid with time frame noted above.

## 2013-10-15 NOTE — Progress Notes (Signed)
Cerave Moisturizing Cream caused feet to break out and swell

## 2013-10-15 NOTE — Progress Notes (Unsigned)
Pt being seen in infusion

## 2013-10-16 ENCOUNTER — Encounter: Payer: Self-pay | Admitting: Radiation Oncology

## 2013-10-16 ENCOUNTER — Ambulatory Visit
Admission: RE | Admit: 2013-10-16 | Discharge: 2013-10-16 | Disposition: A | Discharge status: Home / Self Care | Payer: Medicare Other | Source: Ambulatory Visit | Attending: Radiation Oncology | Admitting: Radiation Oncology

## 2013-10-16 VITALS — Wt 109.2 lb

## 2013-10-16 DIAGNOSIS — C159 Malignant neoplasm of esophagus, unspecified: Secondary | ICD-10-CM

## 2013-10-16 NOTE — Progress Notes (Signed)
Reports left upper quadrant and low back pain 4 on a scale of 0-10. Related to effects of abdominal surgery 1 month ago. Vertical incision well approximated without redness, drainage or edema. Feeding tube noted with scant amount of greenish drainage but, not redness or edema noted around insertion site. Reports that she had chemotherapy on Monday preceded by Zometa. Patient has gained 5 lb since 12/3. Reports dry mouth continues. Only drinks liquids by mouth. Reports swallowing has slightly improved.

## 2013-10-19 ENCOUNTER — Encounter: Payer: Self-pay | Admitting: Radiation Oncology

## 2013-10-19 NOTE — Progress Notes (Signed)
Radiation Oncology         [phone] ________________________________  Name: [name] MRN: [id number]  Date: 10/16/2013  DOB: [birth year]  Follow-Up Visit Note  CC: [name], MD  Reece Packer, MD  Diagnosis:   47 yo woman with metastatic esophageal cancer s/p palliative XRT 08/25/2013-09/05/2013 directed to the distal esophagus / GE junction / T9-11 which received 30 Gy in 10 fractions   Interval Since Last Radiation:  6  weeks  Narrative:  The patient returns today for routine follow-up.  Reports left upper quadrant and low back pain 4 on a scale of 0-10. Related to effects of g-tube placement surgery 1 month ago. Reports that she had chemotherapy on Monday preceded by Zometa. Patient has gained 5 lb since 12/3. Reports dry mouth continues. Only drinks liquids by mouth. Reports swallowing has slightly improved                               ALLERGIES:  is allergic to fentanyl; erythromycin; and morphine and related.  Meds: Current Outpatient Prescriptions  Medication Sig Dispense Refill  . busPIRone (BUSPAR) 15 MG tablet Give 15 mg by tube 2 (two) times daily.       Marland Kitchen escitalopram (LEXAPRO) 10 MG tablet Give 30 mg by tube daily after supper.       . esomeprazole (NEXIUM) 40 MG packet 40 mg by Jejunal Tube route daily before breakfast.      . imipramine (TOFRANIL) 50 MG tablet Give 100 mg by tube at bedtime.       Marland Kitchen levothyroxine (SYNTHROID, LEVOTHROID) 100 MCG tablet Give 100 mcg by tube daily before breakfast.       . lidocaine-prilocaine (EMLA) cream Apply 1-2 hours prior to Porta-Cath accessed  30 g  2  . Melatonin 5 MG TABS Give 5 mg by tube at bedtime.       . methadone (DOLOPHINE) 10 MG tablet Place 3 tablets (30 mg total) into feeding tube every 8 (eight) hours.  9 tablet  0  . methadone (DOLOPHINE) 5 MG/5ML solution Place 30 mLs (30 mg total) into feeding tube every 8 (eight) hours.  1000 mL  0  . nicotine (NICODERM CQ - DOSED IN MG/24 HOURS) 14 mg/24hr  patch Place 1 patch (14 mg total) onto the skin daily.  30 patch  2  . nystatin (MYCOSTATIN) 100000 UNIT/ML suspension       . ondansetron (ZOFRAN ODT) 8 MG disintegrating tablet Take 1 tablet (8 mg total) by mouth every 8 (eight) hours as needed for nausea or vomiting.  30 tablet  2  . ondansetron (ZOFRAN) 40 MG/20ML SOLN injection       . oxyCODONE (ROXICODONE) 5 MG/5ML solution Place 5-10 mLs (5-10 mg total) into feeding tube every 4 (four) hours as needed for moderate pain.  500 mL  0  . potassium chloride 20 MEQ/15ML (10%) solution Place 15 mLs (20 mEq total) into feeding tube 2 (two) times daily.  1000 mL  1  . sucralfate (CARAFATE) 1 GM/10ML suspension Take 1 g by mouth 4 (four) times daily -  with meals and at bedtime.       No current facility-administered medications for this encounter.    Physical Findings: The patient is in no acute distress. Patient is alert and oriented.  weight is 109 lb 3.2 oz (49.533 kg).  On upper abdomen, vertical incision well approximated without redness, drainage  or edema. Feeding tube noted with scant amount of greenish drainage but, not redness or edema noted around insertion site. .  No significant changes.  Lab Findings: Lab Results  Component Value Date   WBC 8.5 10/08/2013   HGB 10.0* 10/08/2013   HCT 30.1* 10/08/2013   MCV 95.8 10/08/2013   PLT 422* 10/08/2013    @LASTCHEM @  Radiographic Findings: Dg Ribs Unilateral W/chest Left  10/02/2013   CLINICAL DATA:  Pain in the left lower lateral rib region status post fall. , history of tobacco use and esophageal malignancy  EXAM: LEFT RIBS AND CHEST - 3+ VIEW  COMPARISON:  Chest x-ray of September 19, 2013.  FINDINGS: The chest film reveals the lungs to be well-expanded and clear. The previously demonstrated area of infiltrate or atelectasis at the left lung base has resolved. There is no pneumothorax or pleural effusion or evidence of a pulmonary contusion. The cardiac silhouette is normal in size. The  pulmonary vascularity is not engorged. The left-sided PICC line is been withdrawn. The power port catheter appears unchanged in position with the tip in the proximal portion of the SVC. The gas pattern in the upper abdomen is nonspecific.  The 2 rib detail films reveal no evidence of an acute displaced fracture. A tubular structure projects over the left upper and mid abdomen is of uncertain etiology.  IMPRESSION: 1. There is no evidence of an acute displaced or nondisplaced rib fracture. There is no pneumothorax or pleural effusion. 2. The previously demonstrated infiltrate in the left lower lobe has resolved.   Electronically Signed   By: David  [place]   On: 10/02/2013 18:11    Impression:  The patient is recovering from the effects of radiation.  Plan:  She is continuing with care under Dr. Darrold Span and will return to rad-onc on an as needed basis.  _____________________________________  Artist Pais Kathrynn Running, M.D.

## 2013-10-20 ENCOUNTER — Encounter: Payer: Self-pay | Admitting: Oncology

## 2013-10-20 ENCOUNTER — Ambulatory Visit (HOSPITAL_BASED_OUTPATIENT_CLINIC_OR_DEPARTMENT_OTHER): Payer: Medicare Other

## 2013-10-20 ENCOUNTER — Ambulatory Visit (HOSPITAL_COMMUNITY)
Admission: RE | Admit: 2013-10-20 | Discharge: 2013-10-20 | Disposition: A | Discharge status: Home / Self Care | Payer: Medicare Other | Source: Ambulatory Visit | Attending: Oncology | Admitting: Oncology

## 2013-10-20 ENCOUNTER — Other Ambulatory Visit (HOSPITAL_BASED_OUTPATIENT_CLINIC_OR_DEPARTMENT_OTHER): Payer: Medicare Other

## 2013-10-20 ENCOUNTER — Ambulatory Visit (HOSPITAL_BASED_OUTPATIENT_CLINIC_OR_DEPARTMENT_OTHER): Payer: Medicare Other | Admitting: Oncology

## 2013-10-20 ENCOUNTER — Ambulatory Visit: Payer: Medicare Other

## 2013-10-20 VITALS — BP 114/74 | HR 98 | Temp 97.8°F | Resp 18 | Ht 63.0 in | Wt 107.6 lb

## 2013-10-20 DIAGNOSIS — C7951 Secondary malignant neoplasm of bone: Secondary | ICD-10-CM

## 2013-10-20 DIAGNOSIS — C159 Malignant neoplasm of esophagus, unspecified: Secondary | ICD-10-CM

## 2013-10-20 DIAGNOSIS — C155 Malignant neoplasm of lower third of esophagus: Secondary | ICD-10-CM

## 2013-10-20 DIAGNOSIS — M7989 Other specified soft tissue disorders: Secondary | ICD-10-CM | POA: Insufficient documentation

## 2013-10-20 DIAGNOSIS — I4581 Long QT syndrome: Secondary | ICD-10-CM

## 2013-10-20 DIAGNOSIS — G35 Multiple sclerosis: Secondary | ICD-10-CM

## 2013-10-20 DIAGNOSIS — E876 Hypokalemia: Secondary | ICD-10-CM

## 2013-10-20 DIAGNOSIS — L03119 Cellulitis of unspecified part of limb: Secondary | ICD-10-CM

## 2013-10-20 DIAGNOSIS — L539 Erythematous condition, unspecified: Secondary | ICD-10-CM

## 2013-10-20 LAB — CBC WITH DIFFERENTIAL/PLATELET
BASO%: 0.1 % (ref 0.0–2.0)
Basophils Absolute: 0 10*3/uL (ref 0.0–0.1)
EOS%: 0.4 % (ref 0.0–7.0)
Eosinophils Absolute: 0 10*3/uL (ref 0.0–0.5)
MCH: 30.7 pg (ref 25.1–34.0)
MCV: 91.5 fL (ref 79.5–101.0)
MONO%: 7.8 % (ref 0.0–14.0)
NEUT#: 6.9 10*3/uL — ABNORMAL HIGH (ref 1.5–6.5)
RBC: 3.06 10*6/uL — ABNORMAL LOW (ref 3.70–5.45)
RDW: 14.9 % — ABNORMAL HIGH (ref 11.2–14.5)
lymph#: 0.4 10*3/uL — ABNORMAL LOW (ref 0.9–3.3)
nRBC: 0 % (ref 0–0)

## 2013-10-20 LAB — COMPREHENSIVE METABOLIC PANEL (CC13)
ALT: 27 U/L (ref 0–55)
AST: 36 U/L — ABNORMAL HIGH (ref 5–34)
Albumin: 2.2 g/dL — ABNORMAL LOW (ref 3.5–5.0)
Alkaline Phosphatase: 165 U/L — ABNORMAL HIGH (ref 40–150)
Anion Gap: 9 mEq/L (ref 3–11)
CO2: 26 mEq/L (ref 22–29)
Chloride: 93 mEq/L — ABNORMAL LOW (ref 98–109)
Glucose: 85 mg/dl (ref 70–140)
Potassium: 4.4 mEq/L (ref 3.5–5.1)
Sodium: 128 mEq/L — ABNORMAL LOW (ref 136–145)
Total Bilirubin: 0.23 mg/dL (ref 0.20–1.20)
Total Protein: 6.3 g/dL — ABNORMAL LOW (ref 6.4–8.3)

## 2013-10-20 MED ORDER — HEPARIN SOD (PORK) LOCK FLUSH 100 UNIT/ML IV SOLN
500.0000 [IU] | Freq: Once | INTRAVENOUS | Status: AC
  Administered 2013-10-20: 500 [IU] via INTRAVENOUS
  Filled 2013-10-20: qty 5

## 2013-10-20 MED ORDER — OXYCODONE HCL 5 MG/5ML PO SOLN
10.0000 mg | ORAL | Status: DC | PRN
  Administered 2013-10-20: 10 mg
  Filled 2013-10-20: qty 10

## 2013-10-20 MED ORDER — SODIUM CHLORIDE 0.9 % IV SOLN
1000.0000 mL | Freq: Once | INTRAVENOUS | Status: AC
  Administered 2013-10-20: 1000 mL via INTRAVENOUS

## 2013-10-20 MED ORDER — OXYCODONE HCL 5 MG/5ML PO SOLN: 10.0000 mg | ORAL | Status: DC | PRN

## 2013-10-20 MED ORDER — CEPHALEXIN 250 MG/5ML PO SUSR: 500.0000 mg | Freq: Three times a day (TID) | ORAL | Status: DC

## 2013-10-20 MED ORDER — OXYCODONE HCL 5 MG/5ML PO SOLN: 5.0000 mg | ORAL | Status: DC | PRN

## 2013-10-20 MED ORDER — DEXTROSE 5 % IV SOLN
2.0000 g | INTRAVENOUS | Status: DC
  Administered 2013-10-20: 2 g via INTRAVENOUS
  Filled 2013-10-20: qty 2

## 2013-10-20 MED ORDER — SUCRALFATE 1 GM/10ML PO SUSP: 1.0000 g | Freq: Three times a day (TID) | ORAL | Status: AC

## 2013-10-20 MED ORDER — SODIUM CHLORIDE 0.9 % IJ SOLN
10.0000 mL | INTRAMUSCULAR | Status: DC | PRN
  Administered 2013-10-20: 10 mL via INTRAVENOUS
  Filled 2013-10-20: qty 10

## 2013-10-20 NOTE — Progress Notes (Signed)
*  Preliminary Results* Bilateral lower extremity venous duplex completed. Bilateral lower extremities are negative for acute deep vein thrombosis. There is no evidence of Baker's cyst bilaterally.  Preliminary results discussed with Darl Pikes, RN of Cancer [facility].   10/20/2013  Gertie Fey, RVT, RDCS, RDMS

## 2013-10-20 NOTE — Progress Notes (Signed)
OFFICE PROGRESS NOTE   10/20/2013   Physicians:Patrick Felix Ahmadi (PCP), Lonie Peak, Claud Kelp, Bindubal Balan, Trudie Buckler ([facility], [place], neurology), Reddy/ Dartha Lodge NP ([facility]), K.Hilty    INTERVAL HISTORY:  Patient is seen, together with mother, in continuing attention to metastatic esophageal/ GE junction adenocarcinoma. She had had difficult course since diagnosis, but was able to have first FOLFOX in palliative attempt 10-13-13, with additional IVF given with pump DC on 10-15-13. She seemed constipated (?) last week, took lactulose and has had diarrhea since last pm, related either to lactulose or chemo. Feeding pump and connector have not functioned in over 12 hours, home care agency to deliver new equipment later today, however she has had no tube feedings today. She is drinking some liquids. Erythema persists distal left foot moreso than right, with "burning" sensation, and increased swelling bilateral LE. She has had no fever. Pain interferes with sleeping, is improved with prn oxycodone liquid in addition to scheduled methadone. Family has noticed some myoclonic jerking at times. It has been difficult for her to keep legs elevated.  She has PAC and jejunostomy tube in.  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 Prior upper endoscopy 3 years ago was normal. Recently she developed difficulty swallowing and pain from epigastrium to LLQ. EGD by Dr [name] in [place] found a stenosed area in distal esophagus which was friable, with abnormal mucosa there and in gastric lumen. [facility] from [facility] showed adenocarcinomaHER 2 was 2+ by immunohistochemical staining, with FISH negative ([facility] addendum from 24 Sept 2014 accession # [id number]). She was hospitalized in [facility]  system 9-23 thru 08-01-13 with pneumonia, with SOB, hypoxemia and dizziness, also hypokalemic. CT CAP 07-31-13 had extensive ground glass opacities in lungs with diffuse peribronchial thickening, esophageal dilatation with confluent soft tissue density involving distal esophagus and GE junction/ adjacent stomach, enlarged AP window node and prominent subcarinal node, mildly enlarged nodes retroperitoneum and root of mesentery, nodal mass 5.4 x 2.7 cm encasing celiac axis and proximal superior mesenteric artery, diffuse hepatic steatosis and bilateral adrenal hyperplasia. Endoscopic ultrasound by Dr [name] 08-08-13 documented stenosing and friable mass from 40 cm extending 5 cm into proximal stomach with maximal depth of invasion 28mm, extending beyond the adventitia; two large lymph nodes were identified, measuring 10mm, however celiac axis area could not be evaluated, this felt to be T4N2 distal esophageal/ proximal gastric tumor. MRI head 08-04-13 was unremarkable. PET 08-19-13 showed primary distal esophagus/ GE junction with involved nodes at gastrohepatic ligament and celiac axis as well as left periaortics, and bony mets T7, T11 and possibly T9 and T10. She received 30 Gy to distal esophagus/GE junction/ T9-11 by Dr Basilio Cairo from 10-20 thru 09-05-13. She was admitted 11-7 thru 09-20-13 prior to availability of G tube placement, with diffuse tumor involvement at stomach such that G tube was impossible and jejunostomy done instead, by Dr Derrell Lolling 09-17-13. There were numerous delays getting tube feedings started after DC, but were begun ~ 09-24-13. Patient appeared improved enough to carefully begin FOLFOX on 10-13-2013.     Review of systems as above, also: No vomiting. No bleeding. No increased SOB. Has gotten jejunostomy feedings up to goal rate prior to equipment malfunction. No problems with PAC for chemo infusion. No oral mucositis symptoms. No cold sensitivity noted. Remainder of 10 point Review of Systems  negative.  Objective:  Vital signs in last 24 hours:  BP 114/74  Pulse 98  Temp(Src) 97.8 F (36.6 C) (Oral)  Resp 18  Ht 5\' 3"  (1.6 m)  Wt 107 lb 9.6 oz (48.807 kg)  BMI 19.07 kg/m2  LMP 04/28/2011 weight is down 1.5 lbs.  Awake, alert, oriented, appropriate conversation. In WC. Respirations not labored RA. Pale, looks chronically ill and cachectic, NAD. No jerking.  HEENT:PERRL, sclerae not icteric. Oral mucosa very dry without lesions, posterior pharynx dry, clear. No alopecia Neck supple. No JVD.  Lymphatics:no cervical,suraclavicular adenopathy Resp: clear to auscultation bilaterally  Cardio: regular rate and rhythm. No gallop. GI: soft, nontender, not distended. Few bowel sounds. Jejunostomy dressing dry.  Musculoskeletal/ Extremities: LE tight swelling bilaterally without weeping. Some mottling right foot and increased erythema distal left foot and toes without streaking up that extremity. Some dry skin desquamation feet bilaterally. Neuro: no peripheral neuropathy. Otherwise nonfocal Skin without rash, ecchymosis, petechiae otherwise as above Portacath-without erythema or tenderness  Lab Results:  Results for orders placed in visit on 10/20/13  CBC WITH DIFFERENTIAL      Result Value Range   WBC 7.9  3.9 - 10.3 10e3/uL   NEUT# 6.9 (*) 1.5 - 6.5 10e3/uL   HGB 9.4 (*) 11.6 - 15.9 g/dL   HCT 82.9 (*) 56.2 - 13.0 %   Platelets 479 (*) 145 - 400 10e3/uL   MCV 91.5  79.5 - 101.0 fL   MCH 30.7  25.1 - 34.0 pg   MCHC 33.6  31.5 - 36.0 g/dL   RBC 8.65 (*) 7.84 - 6.96 10e6/uL   RDW 14.9 (*) 11.2 - 14.5 %   lymph# 0.4 (*) 0.9 - 3.3 10e3/uL   MONO# 0.6  0.1 - 0.9 10e3/uL   Eosinophils Absolute 0.0  0.0 - 0.5 10e3/uL   Basophils Absolute 0.0  0.0 - 0.1 10e3/uL   NEUT% 87.3 (*) 38.4 - 76.8 %   LYMPH% 4.4 (*) 14.0 - 49.7 %   MONO% 7.8  0.0 - 14.0 %   EOS% 0.4  0.0 - 7.0 %   BASO% 0.1  0.0 - 2.0 %   nRBC 0  0 - 0 %  COMPREHENSIVE METABOLIC PANEL (CC13)      Result Value  Range   Sodium 128 (*) 136 - 145 mEq/L   Potassium 4.4  3.5 - 5.1 mEq/L   Chloride 93 (*) 98 - 109 mEq/L   CO2 26  22 - 29 mEq/L   Glucose 85  70 - 140 mg/dl   BUN 7.1  7.0 - 29.5 mg/dL   Creatinine 0.5 (*) 0.6 - 1.1 mg/dL   Total Bilirubin 2.84  0.20 - 1.20 mg/dL   Alkaline Phosphatase 165 (*) 40 - 150 U/L   AST 36 (*) 5 - 34 U/L   ALT 27  0 - 55 U/L   Total Protein 6.3 (*) 6.4 - 8.3 g/dL   Albumin 2.2 (*) 3.5 - 5.0 g/dL   Calcium 8.5  8.4 - 13.2 mg/dL   Anion Gap 9  3 - 11 mEq/L     Studies/Results: Bilateral LE venous dopplers 10-20-13 - No evidence of acute deep vein thrombosis involving the visualized veins of the right lower extremity and left lower extremity. - No evidence of Baker's cyst on the right or left. 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   Medications: I have reviewed the patient's current medications. She will have Rocephin today and begin Keflex solution 12-17.  DISCUSSION: LE venous dopplers done after MD visit and prior to IVF at [facility] today, IVF due to tube feeding problems, diarrhea, obvious dehydration and electrolyte abnormalities.  Assessment/Plan:  1. 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 FOLFOX in palliative attempt begun 10-13-13, doses reduced due to poor performance status. IVF given today and RN to follow up by phone at least 12-17. She would be due for cycle 2 FOLFOX 12-22 if cellulitis, diarrhea, equipment problems improved by then. I will see her at least week of 12-22.  2 chronic pancreatitis since cholecystectomy 1993, better in past 2 years post sphincterotomy and stents  3. On methadone maintenance for drug history: prescriptions for the methadone from this office now rather than from Dr  Betti Cruz. Liquid preferable for J tube administration if that is available.  4. Erythema of distal feet: LE venous dopplers negative for DVT. Will begin antibiotics for probable cellulitis. 5. 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 Interstitial pneumonia treated during hospitalization in Sept and radiographic pneumonia also treated during hospitalization in Nov. No symptoms of pulmonary infection now.  13. long tobacco recently DCd  14.Code status DNR at patient's request   TIme spent including coordination of care and >50% discussion 35 min    Doyne Ellinger P, MD   10/20/2013, 12:37 PM

## 2013-10-20 NOTE — Patient Instructions (Signed)

## 2013-10-22 ENCOUNTER — Ambulatory Visit: Payer: Self-pay | Admitting: Radiation Oncology

## 2013-10-22 ENCOUNTER — Telehealth: Payer: Self-pay

## 2013-10-22 ENCOUNTER — Telehealth: Payer: Self-pay | Admitting: Oncology

## 2013-10-22 NOTE — Telephone Encounter (Signed)
Caitlin told that Dr. Darrold Span will order a PT/OT evaluation as Ms. Hoffman wants to try to be able to go to her home and not have to stay with her mother. Discussed difficulty in contactin patient on cell, and mother at home number and cell.   Requested that Debby Bud try to see Ms. Lopezin AM tomorrow to assess her.  She has been having diarrhea and issues with feeding tube equipment.  This nurse also has difficultly reaching patient and mother.  Not able to reach either one today. Told Catlin about c-diff and b-met if issues with diarrhea and hydration are continuing. Debby Bud stated that she will try to reach mom and see pt. In am 10-23-13 vs between 2-5 as mother requests.  She will call this office with any concerns or have patient to to ED if necessary after evaluation.                Patient Demographics     Patient Name Sex DOB SSN Address Phone    [name], [name] Female 01/24/1966 [id number] [place] [address] [phone] (Home) [phone] (Mobile)              Message Received: 1 day ago     Reece Packer, MD Lorine Bears, RN; Phillis Knack, RN            Please check on her by phone 12-17. May need to delay treatment with #2 FOLFOX 12-22 if still having diarrhea or if still problems with tube feeding equipment.  May need BMET by [facility] on ~ 12-18. Needs stool for C diff if still having watery diarrhea.   Cc LA, TH for 12-17

## 2013-10-22 NOTE — Telephone Encounter (Signed)
, °

## 2013-10-23 ENCOUNTER — Telehealth: Payer: Self-pay | Admitting: *Deleted

## 2013-10-23 NOTE — Telephone Encounter (Signed)
Operator gave the following message to Triage at 11:45 am.   "Nurse from [facility] called reported Patient's diarrhea has cleared.  Swelling has gone down in her feet.  She does have a yeast infection and needs something for yeast.  [facility] nurse can be reached at [phone].  Patient may be reached at [phone]."

## 2013-10-24 ENCOUNTER — Telehealth: Payer: Self-pay

## 2013-10-24 DIAGNOSIS — B379 Candidiasis, unspecified: Secondary | ICD-10-CM

## 2013-10-24 MED ORDER — FLUCONAZOLE 40 MG/ML PO SUSR: 100.0000 mg | Freq: Every day | ORAL | Status: DC

## 2013-10-24 NOTE — Telephone Encounter (Signed)
Left message for [name] that a prescription was called into [facility] for diflucan suspension 100 mg daily x 7.  Left message for [name] as well that prescription was called in.

## 2013-10-25 ENCOUNTER — Other Ambulatory Visit: Payer: Self-pay | Admitting: Oncology

## 2013-10-26 ENCOUNTER — Other Ambulatory Visit: Payer: Self-pay | Admitting: Oncology

## 2013-10-26 DIAGNOSIS — C159 Malignant neoplasm of esophagus, unspecified: Secondary | ICD-10-CM

## 2013-10-27 ENCOUNTER — Other Ambulatory Visit: Payer: Self-pay

## 2013-10-27 ENCOUNTER — Ambulatory Visit (HOSPITAL_BASED_OUTPATIENT_CLINIC_OR_DEPARTMENT_OTHER): Payer: Medicare Other

## 2013-10-27 ENCOUNTER — Other Ambulatory Visit: Payer: Self-pay | Admitting: Oncology

## 2013-10-27 ENCOUNTER — Ambulatory Visit: Payer: Medicare Other | Admitting: Nutrition

## 2013-10-27 VITALS — BP 125/84 | HR 95 | Temp 97.6°F | Resp 18

## 2013-10-27 DIAGNOSIS — L03119 Cellulitis of unspecified part of limb: Secondary | ICD-10-CM

## 2013-10-27 DIAGNOSIS — C801 Malignant (primary) neoplasm, unspecified: Secondary | ICD-10-CM

## 2013-10-27 DIAGNOSIS — C7951 Secondary malignant neoplasm of bone: Secondary | ICD-10-CM

## 2013-10-27 DIAGNOSIS — C155 Malignant neoplasm of lower third of esophagus: Secondary | ICD-10-CM

## 2013-10-27 DIAGNOSIS — C159 Malignant neoplasm of esophagus, unspecified: Secondary | ICD-10-CM

## 2013-10-27 LAB — CBC WITH DIFFERENTIAL/PLATELET
Eosinophils Absolute: 0.1 10*3/uL (ref 0.0–0.5)
HCT: 29.2 % — ABNORMAL LOW (ref 34.8–46.6)
LYMPH%: 6.7 % — ABNORMAL LOW (ref 14.0–49.7)
MCHC: 34.4 g/dL (ref 31.5–36.0)
MCV: 94.2 fL (ref 79.5–101.0)
MONO#: 1 10*3/uL — ABNORMAL HIGH (ref 0.1–0.9)
NEUT#: 7 10*3/uL — ABNORMAL HIGH (ref 1.5–6.5)
NEUT%: 79.3 % — ABNORMAL HIGH (ref 38.4–76.8)
Platelets: 499 10*3/uL — ABNORMAL HIGH (ref 145–400)
WBC: 8.9 10*3/uL (ref 3.9–10.3)

## 2013-10-27 LAB — BASIC METABOLIC PANEL (CC13)
BUN: 9.6 mg/dL (ref 7.0–26.0)
CO2: 28 mEq/L (ref 22–29)
Calcium: 8.6 mg/dL (ref 8.4–10.4)
Creatinine: 0.5 mg/dL — ABNORMAL LOW (ref 0.6–1.1)
Glucose: 83 mg/dl (ref 70–140)

## 2013-10-27 MED ORDER — HEPARIN SOD (PORK) LOCK FLUSH 100 UNIT/ML IV SOLN
500.0000 [IU] | Freq: Once | INTRAVENOUS | Status: AC
  Administered 2013-10-27: 500 [IU] via INTRAVENOUS
  Filled 2013-10-27: qty 5

## 2013-10-27 MED ORDER — SODIUM CHLORIDE 0.9 % IV SOLN
1000.0000 mL | Freq: Once | INTRAVENOUS | Status: AC
  Administered 2013-10-27: 250 mL via INTRAVENOUS

## 2013-10-27 MED ORDER — OXYCODONE HCL 5 MG/5ML PO SOLN: 5.0000 mg | ORAL | Status: DC | PRN

## 2013-10-27 MED ORDER — SODIUM CHLORIDE 0.9 % IJ SOLN
10.0000 mL | INTRAMUSCULAR | Status: DC | PRN
  Administered 2013-10-27: 10 mL via INTRAVENOUS
  Filled 2013-10-27: qty 10

## 2013-10-27 MED ORDER — METHADONE HCL 5 MG/5ML PO SOLN: 30.0000 mg | Freq: Three times a day (TID) | ORAL | Status: AC

## 2013-10-27 MED ORDER — ZOLEDRONIC ACID 4 MG/5ML IV CONC: 4.0000 mg | Freq: Once | INTRAVENOUS | Status: DC

## 2013-10-27 NOTE — Progress Notes (Signed)
Followup completed with patient today.  Chemotherapy treatment was canceled.  Patient reports Osmolite 1.2, is mostly tolerated at 50 mL an hour.  Patient reports she has been unable to increase to goal rate of 60 mL an hour.  She continues to use feeding tube which provides 30 cc of water every hour for total of 720 mL daily.  Patient reports she is drinking soft drinks "to settle her stomach."  She describes nausea when attempting to increase jejunostomy feedings to 60 mL an hour.  She has been flushing her feeding tube with an additional 100 mL of water throughout the day.  Last weight documented on December 15 was 107.6 pounds.  Nutrition diagnosis: Unintended weight loss improved.  Intervention: I've recommended patient discontinue 100 mL free water flushes via jejunostomy tube throughout the day.  Patient encouraged to take nausea medications consistently.  I've encouraged patient to increase Osmolite 1.2 to 55 milliliters an hour initially and then eventually to goal of 60 mL an hour.   I've explained patient needs to run tube feeding continuously over 24 hours at goal rate of 60 mL an hour to deliver adequate calories and protein.  Patient verbalizes understanding and willingness to try.  6 cans of Osmolite 1.2, along with free water flushes provide 1728 calories, 80 g protein, 1901 mL free water.  Monitoring, evaluation, goals: Patient will tolerate increase in tube feeding to meet 100% of estimated needs to promote weight gain.  Next visit:  Monday, December 29th, during chemotherapy.

## 2013-10-27 NOTE — Progress Notes (Signed)
Confirmed with Dr. Darrold Span of rescheduled Folfox tx for 11/03/13 per patient initially not wanting to be treated the wk of Christmas. Labs drawn, zometa not due until week of 11/03/13 at the very earliest. AVS printed and appts explained to mother and patient, both voice understanding.  Louise RN with Dr. Darrold Span to provide prescriptions for oxycodone and methadone.

## 2013-10-28 ENCOUNTER — Telehealth: Payer: Self-pay

## 2013-10-28 DIAGNOSIS — C155 Malignant neoplasm of lower third of esophagus: Secondary | ICD-10-CM

## 2013-10-28 NOTE — Telephone Encounter (Signed)
[name] CALLED STATING THAT THE REDNESS HAS INCREASED IN HER RIGHT FOOT TODAY. The foot is painful to the touch and it is difficult to walk.   Pt. Has completed course of keflex~10-26-13.

## 2013-10-28 NOTE — Telephone Encounter (Signed)
Reached [name] and she will come in to see Dr. Darrold Span at 0930 10-29-13 to look at her feet.

## 2013-10-29 ENCOUNTER — Ambulatory Visit (HOSPITAL_BASED_OUTPATIENT_CLINIC_OR_DEPARTMENT_OTHER): Payer: Medicare Other | Admitting: Oncology

## 2013-10-29 ENCOUNTER — Encounter: Payer: Self-pay | Admitting: Oncology

## 2013-10-29 ENCOUNTER — Other Ambulatory Visit (HOSPITAL_BASED_OUTPATIENT_CLINIC_OR_DEPARTMENT_OTHER): Payer: Medicare Other

## 2013-10-29 VITALS — BP 108/74 | HR 106 | Temp 97.8°F | Resp 18 | Ht 63.0 in | Wt 113.1 lb

## 2013-10-29 DIAGNOSIS — C155 Malignant neoplasm of lower third of esophagus: Secondary | ICD-10-CM

## 2013-10-29 DIAGNOSIS — I4581 Long QT syndrome: Secondary | ICD-10-CM

## 2013-10-29 DIAGNOSIS — K861 Other chronic pancreatitis: Secondary | ICD-10-CM

## 2013-10-29 DIAGNOSIS — C160 Malignant neoplasm of cardia: Secondary | ICD-10-CM

## 2013-10-29 DIAGNOSIS — R21 Rash and other nonspecific skin eruption: Secondary | ICD-10-CM

## 2013-10-29 DIAGNOSIS — G35 Multiple sclerosis: Secondary | ICD-10-CM

## 2013-10-29 DIAGNOSIS — C159 Malignant neoplasm of esophagus, unspecified: Secondary | ICD-10-CM

## 2013-10-29 DIAGNOSIS — E032 Hypothyroidism due to medicaments and other exogenous substances: Secondary | ICD-10-CM

## 2013-10-29 DIAGNOSIS — L539 Erythematous condition, unspecified: Secondary | ICD-10-CM

## 2013-10-29 LAB — CBC WITH DIFFERENTIAL/PLATELET
BASO%: 0.6 % (ref 0.0–2.0)
Basophils Absolute: 0 10*3/uL (ref 0.0–0.1)
EOS%: 2.1 % (ref 0.0–7.0)
MCH: 30.4 pg (ref 25.1–34.0)
MCHC: 32.2 g/dL (ref 31.5–36.0)
MCV: 94.6 fL (ref 79.5–101.0)
MONO#: 1.1 10*3/uL — ABNORMAL HIGH (ref 0.1–0.9)
MONO%: 15.8 % — ABNORMAL HIGH (ref 0.0–14.0)
RBC: 3.35 10*6/uL — ABNORMAL LOW (ref 3.70–5.45)
RDW: 15.8 % — ABNORMAL HIGH (ref 11.2–14.5)
WBC: 7.1 10*3/uL (ref 3.9–10.3)
lymph#: 0.6 10*3/uL — ABNORMAL LOW (ref 0.9–3.3)
nRBC: 0 % (ref 0–0)

## 2013-10-29 MED ORDER — HYDROCORTISONE 1 % EX CREA: 1.0000 "application " | TOPICAL_CREAM | Freq: Two times a day (BID) | CUTANEOUS | Status: AC

## 2013-10-29 MED ORDER — CEPHALEXIN 250 MG/5ML PO SUSR: 500.0000 mg | Freq: Three times a day (TID) | ORAL | Status: AC

## 2013-10-29 MED ORDER — ONDANSETRON 8 MG PO TBDP: 8.0000 mg | ORAL_TABLET | Freq: Three times a day (TID) | ORAL | Status: AC | PRN

## 2013-10-29 NOTE — Patient Instructions (Signed)
Eucerin cream liberally to feet, legs, hands, arms and rest of body, best while skin is moist just after bathing.  Hydrocortisone cream to arms sparingly twice daily for now  Will resume Keflex suspension, this and hydrocortisone to be sent to [facility]  Try not to decrease feedings from goal rate if possible  OK to use imodium for occasional diarrhea, which is likely related to tube feedings and/ or chemo. Let MD know if worse.

## 2013-10-29 NOTE — Progress Notes (Signed)
OFFICE PROGRESS NOTE   10/29/2013   Physicians:Patrick Felix Ahmadi (PCP), Lonie Peak, Claud Kelp, Bindubal Balan, Trudie Buckler ([place], [place], neurology), Reddy/ Dartha Lodge NP ([facility]), K.Hilty    INTERVAL HISTORY:  Patient is seen, together with mother, as work in today due to recurrent erythema and discomfort in feet since completing antibiotics on 10-27-13, this in setting of metastatic esophageal cancer with very complicated course to date including one cycle FOLFOX on 10-13-13 and continuous feedings via jejunostomy. She had negative LE venous dopplers on 10-20-13, done for swelling in LE and marked erythema and burning pain in feet; she had Rocephin 10-20-13 then Keflex x 7 days, with good improvement in feet by completion of the Keflex. She has not been febrile prior to or since completing those antibiotics. Since yesterday she has had increased reddness especially left distal foot, and again some burning discomfort. LE are less swollen than a week ago, and she is elevating them at times. She also complains of puritic rash distal forearms and hands x a week, none on trunk or elsewhere.   Otherwise, she has been able to get feedings to goal rate since she stopped 100cc fluid boluses per dietician recommendation. Equipment for the feeding tube is now functioning well. She is swallowing pills and was able to swallow tiny pieces of vegetable in soup last pm. She is now sleeping 3-4 hours at a time at night, total ~ 7 hours last pm. Pain is adequately controlled with oxycodone liquid q 4 hrs RTC in addition to methadone. Diarrhea was resolved when [facility] RN saw her on 12-18, tho very soft with a couple of diarrhea episodes since then, possibly related to chemo and/or tube feedings. Home PT/OT evaluation is scheduled; I have given prescription for 4 prong cane today; they are going to medical supply to get WC and walker today.  Diflucan has improved oral and vaginal  yeast, which developed    ONCOLOGIC HISTORY Patient has been followed by Dr [name] for chronic pancreatitis related to sphincter of Odi dysfunction, with improvement since sphincterotomy procedure with stents ~ 2 years ago. Prior upper endoscopy 3 years ago was normal. Recently she developed difficulty swallowing and pain from epigastrium to LLQ. EGD by Dr [name] in [place] found a stenosed area in distal esophagus which was friable, with abnormal mucosa there and in gastric lumen. Pathology from [facility] showed adenocarcinomaHER 2 was 2+ by immunohistochemical staining, with FISH negative ([facility] addendum from 24 Sept 2014 accession # [id number]). She was hospitalized in [facility] system 9-23 thru 08-01-13 with pneumonia, with SOB, hypoxemia and dizziness, also hypokalemic. CT CAP 07-31-13 had extensive ground glass opacities in lungs with diffuse peribronchial thickening, esophageal dilatation with confluent soft tissue density involving distal esophagus and GE junction/ adjacent stomach, enlarged AP window node and prominent subcarinal node, mildly enlarged nodes retroperitoneum and root of mesentery, nodal mass 5.4 x 2.7 cm encasing celiac axis and proximal superior mesenteric artery, diffuse hepatic steatosis and bilateral adrenal hyperplasia. Endoscopic ultrasound by Dr [name] 08-08-13 documented stenosing and friable mass from 40 cm extending 5 cm into proximal stomach with maximal depth of invasion 28mm, extending beyond the adventitia; two large lymph nodes were identified, measuring 10mm, however celiac axis area could not be evaluated, this felt to be T4N2 distal esophageal/ proximal gastric tumor. MRI head 08-04-13 was unremarkable. PET 08-19-13 showed primary distal esophagus/ GE junction with involved nodes at gastrohepatic ligament and celiac axis as well as left  periaortics, and bony mets T7, T11 and possibly T9 and T10. She received 30 Gy to distal esophagus/GE junction/ T9-11  by Dr Basilio Cairo from 10-20 thru 09-05-13. She was admitted 11-7 thru 09-20-13 prior to availability of G tube placement, with diffuse tumor involvement at stomach such that G tube was impossible and jejunostomy done instead, by Dr Derrell Lolling 09-17-13. There were numerous delays getting tube feedings started after DC, but were begun ~ 09-24-13. Patient appeared improved enough to carefully begin FOLFOX on 10-13-2013.  Review of systems as above, also: Feels some stronger overall. No bleeding. Some HA improves when she lies down and rests. Remainder of 10 point Review of Systems negative.  Objective:  Vital signs in last 24 hours:  BP 108/74  Pulse 106  Temp(Src) 97.8 F (36.6 C) (Oral)  Resp 18  Ht 5\' 3"  (1.6 m)  Wt 113 lb 1.5 oz (51.3 kg)  BMI 20.04 kg/m2  LMP 04/28/2011 Weight is up 6 lbs from 12-15 (with shoes on).  Alert, oriented and appropriate, looks brighter and more comfortable today than I have seen her. In [facility] but repositions herself easily. No alopecia  HEENT:PERRL, sclerae not icteric. Oral mucosa very dry without lesions, posterior pharynx clear.  Neck supple. No JVD.  Lymphatics:no cervical,suraclavicular adenopathy Resp: diminished BS thruout otherwise clear to auscultation bilaterally  Cardio: regular rate and rhythm. No gallop. GI: soft, not tender to gentle palpation, not distended, no mass or organomegaly. Few bowel sounds. J tube dressing dry. Musculoskeletal/ Extremities: slight puffiness feet and ankles, but swelling much less than at last exam. Distal left foot and toes diffusely erythematous and warm, much less erythema in similar distribution on right. No cords or tenderness in legs. Neuro: no peripheral neuropathy. Otherwise nonfocal Skin dry thruout. Excoriated slightly raised nonerythematous, nonvesicular 2-3 mm lesions on wrists and lower forearms, scattered, no apparent suprainfection. No ecchymoses, no other rash. Portacath-without erythema or tenderness  Lab  Results:  Results for orders placed in visit on 10/29/13  CBC WITH DIFFERENTIAL      Result Value Range   WBC 7.1  3.9 - 10.3 10e3/uL   NEUT# 5.3  1.5 - 6.5 10e3/uL   HGB 10.2 (*) 11.6 - 15.9 g/dL   HCT 16.1 (*) 09.6 - 04.5 %   Platelets 575 (*) 145 - 400 10e3/uL   MCV 94.6  79.5 - 101.0 fL   MCH 30.4  25.1 - 34.0 pg   MCHC 32.2  31.5 - 36.0 g/dL   RBC 4.09 (*) 8.11 - 9.14 10e6/uL   RDW 15.8 (*) 11.2 - 14.5 %   lymph# 0.6 (*) 0.9 - 3.3 10e3/uL   MONO# 1.1 (*) 0.1 - 0.9 10e3/uL   Eosinophils Absolute 0.2  0.0 - 0.5 10e3/uL   Basophils Absolute 0.0  0.0 - 0.1 10e3/uL   NEUT% 73.6  38.4 - 76.8 %   LYMPH% 7.9 (*) 14.0 - 49.7 %   MONO% 15.8 (*) 0.0 - 14.0 %   EOS% 2.1  0.0 - 7.0 %   BASO% 0.6  0.0 - 2.0 %   nRBC 0  0 - 0 %    BMET 10-20-13 with Na up to 134, Cl up to 97 and creat 0.5  Studies/Results:  No results found.  Medications: I have reviewed the patient's current medications. Will continue Keflex suspension x another week. Complete diflucan. Continue same pain medication, refills done 10-27-13. Will try hydrocortisone cream 1% sparingly bid to puritic rash on UE. She will begin Eucerin  cream frequently to all skin areas. She will use prn imodium for diarrhea and let us know if this worsens.  DISCUSSION: FOLFOX held this week for Christmas; will proceed with cycle 2 12-29 (no increase in doses yet) and give extra IVF with pump DC on 12-31. She will have zometa on 1-5. I will see her 12-26 IF NEEDED (that appointment left in place for now) and on 11-10-12.  Assessment/Plan:  1. 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 FOLFOX in palliative attempt begun 10-13-13, doses reduced due to poor performance status. Cycle 2 FOLFOX 11-03-13 if stable. Counts maintaining well with doses used cycle 1. . 2 chronic pancreatitis since cholecystectomy 1993, better in past 2 years post  sphincterotomy and stents  3. On methadone maintenance for drug history: prescriptions for the methadone from this office now rather than from Dr Betti Cruz. Liquid preferable for J tube administration if that is available.  4. Erythema of distal feet: LE venous dopplers negative for DVT on 10-20-13. Improved with week of antibiotics but more symptomatic now 48 hours off antibiotics, so will give another week of keflex, as suspension by J tube if possible. 5. 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 Hydrocortisone cream and follow 8.flu vaccine done  9.multiple sclerosis not more symptomatic presently  10.long QT syndrome  11.hypokalemia: resolved now on 20 mEq liquid daily, follow  12. Interstitial pneumonia treated during hospitalization in Sept and radiographic pneumonia also treated during hospitalization in Nov. No symptoms of pulmonary infection now.  13. long tobacco recently DCd  14.Code status DNR at patient's request 15. Severe protein calorie malnutrition related to inability to swallow: tube feedings now at goal rate, still continuous. Appreciate ongoing follow up by [facility] dietician. [facility] managing equipment. 16.hx migraines but tolerates zofran 17.general weakness and debility related to above: PT/OT evaluation pending per [facility]. Equipment obviously necessary including WC, walker and cane.      LIVESAY,LENNIS P, MD   10/29/2013, 10:32 AM

## 2013-10-31 ENCOUNTER — Telehealth: Payer: Self-pay

## 2013-10-31 ENCOUNTER — Ambulatory Visit: Payer: Medicare Other | Admitting: Oncology

## 2013-10-31 ENCOUNTER — Other Ambulatory Visit: Payer: Medicare Other

## 2013-10-31 ENCOUNTER — Ambulatory Visit: Payer: Medicare Other

## 2013-10-31 NOTE — Telephone Encounter (Signed)
Ms. [name] called stating that the redness in her feet has decreased and that there is no pain now.  She does not feel that she needs to come in to the office for visit today Requested refill on Oxycodone solution.  She states that she is using 10 mg q 4 hours ATC =60 ml in 24 hour period.  Gave patient 500 ml on 10-29-13. This would need to be refilled ~ 11-05-13.  Will review with  Dr. Darrold Span and see if prescription can be given 11-03-13 when she comes for her treatment.  Next visit with Dr. Darrold Span is 11-10-13.

## 2013-10-31 NOTE — Progress Notes (Signed)
Patient didn't come for flush appt this am.

## 2013-11-03 ENCOUNTER — Other Ambulatory Visit (HOSPITAL_BASED_OUTPATIENT_CLINIC_OR_DEPARTMENT_OTHER): Payer: Medicare Other

## 2013-11-03 ENCOUNTER — Other Ambulatory Visit: Payer: Self-pay | Admitting: *Deleted

## 2013-11-03 ENCOUNTER — Encounter: Payer: Self-pay | Admitting: Oncology

## 2013-11-03 ENCOUNTER — Ambulatory Visit: Payer: Medicare Other | Admitting: Nutrition

## 2013-11-03 ENCOUNTER — Ambulatory Visit (HOSPITAL_BASED_OUTPATIENT_CLINIC_OR_DEPARTMENT_OTHER): Payer: Medicare Other

## 2013-11-03 ENCOUNTER — Other Ambulatory Visit: Payer: Medicare Other

## 2013-11-03 ENCOUNTER — Ambulatory Visit (HOSPITAL_BASED_OUTPATIENT_CLINIC_OR_DEPARTMENT_OTHER): Payer: Medicare Other | Admitting: Oncology

## 2013-11-03 VITALS — BP 124/77 | HR 110 | Temp 98.4°F | Resp 19

## 2013-11-03 DIAGNOSIS — C159 Malignant neoplasm of esophagus, unspecified: Secondary | ICD-10-CM

## 2013-11-03 DIAGNOSIS — C155 Malignant neoplasm of lower third of esophagus: Secondary | ICD-10-CM

## 2013-11-03 DIAGNOSIS — L03119 Cellulitis of unspecified part of limb: Secondary | ICD-10-CM

## 2013-11-03 DIAGNOSIS — G893 Neoplasm related pain (acute) (chronic): Secondary | ICD-10-CM

## 2013-11-03 DIAGNOSIS — R5381 Other malaise: Secondary | ICD-10-CM

## 2013-11-03 DIAGNOSIS — R609 Edema, unspecified: Secondary | ICD-10-CM

## 2013-11-03 DIAGNOSIS — Z5111 Encounter for antineoplastic chemotherapy: Secondary | ICD-10-CM

## 2013-11-03 DIAGNOSIS — C160 Malignant neoplasm of cardia: Secondary | ICD-10-CM

## 2013-11-03 LAB — COMPREHENSIVE METABOLIC PANEL (CC13)
ALT: 24 U/L (ref 0–55)
AST: 29 U/L (ref 5–34)
Albumin: 1.8 g/dL — ABNORMAL LOW (ref 3.5–5.0)
Alkaline Phosphatase: 240 U/L — ABNORMAL HIGH (ref 40–150)
Glucose: 83 mg/dl (ref 70–140)
Sodium: 134 mEq/L — ABNORMAL LOW (ref 136–145)
Total Bilirubin: 0.22 mg/dL (ref 0.20–1.20)
Total Protein: 5.7 g/dL — ABNORMAL LOW (ref 6.4–8.3)

## 2013-11-03 LAB — CBC WITH DIFFERENTIAL/PLATELET
BASO%: 1.1 % (ref 0.0–2.0)
EOS%: 0.5 % (ref 0.0–7.0)
MCH: 30.1 pg (ref 25.1–34.0)
MCHC: 31.7 g/dL (ref 31.5–36.0)
MCV: 95 fL (ref 79.5–101.0)
MONO#: 0.8 10*3/uL (ref 0.1–0.9)
MONO%: 14 % (ref 0.0–14.0)
NEUT#: 4 10*3/uL (ref 1.5–6.5)
RBC: 3.42 10*6/uL — ABNORMAL LOW (ref 3.70–5.45)
RDW: 15.8 % — ABNORMAL HIGH (ref 11.2–14.5)
nRBC: 0 % (ref 0–0)

## 2013-11-03 MED ORDER — FLUOROURACIL CHEMO INJECTION 500 MG/10ML
200.0000 mg/m2 | Freq: Once | INTRAVENOUS | Status: AC
  Administered 2013-11-03: 300 mg via INTRAVENOUS
  Filled 2013-11-03: qty 6

## 2013-11-03 MED ORDER — DEXAMETHASONE SODIUM PHOSPHATE 10 MG/ML IJ SOLN
INTRAMUSCULAR | Status: AC
  Filled 2013-11-03: qty 1

## 2013-11-03 MED ORDER — SODIUM CHLORIDE 0.9 % IV SOLN
1200.0000 mg/m2 | INTRAVENOUS | Status: DC
  Administered 2013-11-03: 1750 mg via INTRAVENOUS
  Filled 2013-11-03: qty 35

## 2013-11-03 MED ORDER — DEXTROSE 5 % IV SOLN
Freq: Once | INTRAVENOUS | Status: AC
  Administered 2013-11-03: 14:00:00 via INTRAVENOUS

## 2013-11-03 MED ORDER — SODIUM CHLORIDE 0.9 % IV SOLN
Freq: Once | INTRAVENOUS | Status: AC
  Administered 2013-11-03: 12:00:00 via INTRAVENOUS

## 2013-11-03 MED ORDER — OXYCODONE HCL 5 MG/5ML PO SOLN: 10.0000 mg | Freq: Once | ORAL | Status: DC

## 2013-11-03 MED ORDER — OXYCODONE HCL 5 MG/5ML PO SOLN: 5.0000 mg | ORAL | Status: DC | PRN

## 2013-11-03 MED ORDER — ZOLEDRONIC ACID 4 MG/100ML IV SOLN
4.0000 mg | Freq: Once | INTRAVENOUS | Status: AC
  Administered 2013-11-03: 4 mg via INTRAVENOUS
  Filled 2013-11-03: qty 100

## 2013-11-03 MED ORDER — LEUCOVORIN CALCIUM INJECTION 350 MG
200.0000 mg/m2 | Freq: Once | INTRAVENOUS | Status: AC
  Administered 2013-11-03: 290 mg via INTRAVENOUS
  Filled 2013-11-03: qty 14.5

## 2013-11-03 MED ORDER — DEXAMETHASONE SODIUM PHOSPHATE 10 MG/ML IJ SOLN
10.0000 mg | Freq: Once | INTRAMUSCULAR | Status: AC
  Administered 2013-11-03: 10 mg via INTRAVENOUS

## 2013-11-03 MED ORDER — OXYCODONE HCL 5 MG/5ML PO SOLN: ORAL | Status: DC

## 2013-11-03 MED ORDER — NAPROXEN 125 MG/5ML PO SUSP: ORAL | Status: AC

## 2013-11-03 MED ORDER — ONDANSETRON 8 MG/NS 50 ML IVPB
INTRAVENOUS | Status: AC
  Filled 2013-11-03: qty 8

## 2013-11-03 MED ORDER — ONDANSETRON 8 MG/50ML IVPB (CHCC)
8.0000 mg | Freq: Once | INTRAVENOUS | Status: AC
  Administered 2013-11-03: 8 mg via INTRAVENOUS

## 2013-11-03 MED ORDER — OXALIPLATIN CHEMO INJECTION 100 MG/20ML
42.5000 mg/m2 | Freq: Once | INTRAVENOUS | Status: AC
  Administered 2013-11-03: 60 mg via INTRAVENOUS
  Filled 2013-11-03: qty 12

## 2013-11-03 MED ORDER — OXYCODONE HCL 5 MG/5ML PO SOLN
10.0000 mg | Freq: Once | ORAL | Status: AC
  Administered 2013-11-03: 10 mg
  Filled 2013-11-03: qty 10

## 2013-11-03 NOTE — Progress Notes (Signed)
Nutrition followup completed in chemotherapy.  Patient receiving chemotherapy for esophageal cancer.  Patient has a feeding jejunostomy tube and is tolerating Osmolite 1.2.  Patient estimates she tolerates tube feedings at rate of 60 mL an hour on the average of 18 hours daily.  Patient continues to adjust tube feeding and free water depending on amount of nausea.  Weight has increased to 113.1 pounds December 24.  Patient no longer giving free water flushes through jejunostomy tube resulting in better tolerance of feedings.  Patient does continue to drink liquids throughout the day and is receiving an additional 720 mL an hour of free water through jejunostomy feedings daily.  Nutrition diagnosis: Unintended weight loss improved.  Estimated nutrition needs: 1600-1800 calories, 80-100 g protein, 1.8 L fluid.  6 cans of Osmolite 1.2, along with free water flushes provide 1728 calories, 80 g protein, 1901 mL free water.  Intervention: Patient educated to continue Osmolite 1.2 at 60 mL an hour and increase to 24 hours daily as tolerated.  She will continue 30 cc of free water flushes q. hour over 24 hours.  She can continue to consume liquids by mouth as tolerated. Recommended patient consider increasing rate of feedings to 80 ml/hr and running for 18 hours daily as an alternative option.  Patient will consider this.  Monitoring, evaluation, and goals: Patient is tolerating tube feeding at a higher rate providing additional calories and protein to result in weight gain.  Patient will continue to work to increase tube feedings to goal.  Next visit: Monday, January 5, during chemotherapy.

## 2013-11-03 NOTE — Progress Notes (Signed)
Pt instructed that Dr Darrold Span spoke with pain mgmt doctor. Is going to increase oxycodone to 10-15 mg every 4 hours as needed for pain. Also added naprosyn suspension 125mg /80ml twice a day with tube feedings. The Oxycodone needs to be filled at [facility] be [facility] to [facility] Tuesday 11/11/13. Pt verbalized understanding. Rx for Naprosyn called into [address] [facility].

## 2013-11-03 NOTE — Progress Notes (Signed)
Upon arrival, Ms. [name] states she has had increased nausea for the last 24 hours. Labs and vital signs stable. Dr. Darrold Span notified. Received verbal order to give chemo pre-meds of zofran and decadron and normal saline for 30 mins and reasseess patient's nausea.  12:50pm - Dr. Darrold Span came over to infusion area to assess patient. Per MD okay to precede with chemotherapy today. Chemotherapy and zometa given without any complications. Patient discharged home with son. Angelena Form, RN

## 2013-11-03 NOTE — Progress Notes (Signed)
OFFICE PROGRESS NOTE   11/03/2013   Physicians:Patrick Felix Ahmadi (PCP), Lonie Peak, Claud Kelp, Bindubal Balan, Trudie Buckler ([place], [place], neurology), Reddy/ Dartha Lodge NP ([facility]), K.Hilty   INTERVAL HISTORY:  Patient is seen in infusion area, together with 46 yo son, for unscheduled visit due to continuing pain and other issues. She is beginning cycle 2 FOLFOX, doses reduced 50% as with cycle 1 and treatment delayed one week to today in order to avoid Christmas.  Patient still has intermittent nausea but has continued jejunostomy feedings, mostly continuously and at times at goal rate of 60 cc/hr. She is complaining of increased pain from epigastrium around left lower ribs/ upper abdomen. She has some minimal cough and slight erythema distal feet. She is able to swallow liquids and soft foods in small amounts. PAC is functioning without problems noted now.  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 She has been treated to this point with RT to distal esophagus and T9-11 by Dr Basilio Cairo in Oct 2014 and began FOLFOX in palliative attempt in early Dec 2014 after jejunostomy placement and tube feedings tolerated. She has history of chronic pancreatitis and substance abuse, already on methadone 30 mg tid when the esophageal cancer was diagnosed. Code status is DNR however she is not yet under Hospice care due to treatment being attempted with chemotherapy in palliative attempt. She had possible pneumonia in Sept and has had sensitive, erythematous left foot without DVT.    Review of systems as above, also: No overt bleeding. No fever. Tolerates methadone via J tube. Remainder of 10 point Review of Systems negative.  Objective:  Vital signs in last 24 hours: per infusion flow sheets. Afebrile, respirations 18 and not labored RA   Alert, oriented and  appropriate, mildly uncomfortable in recliner in infusion area, moves all extremities. Cachectic, looks chronically ill. No alopecia  HEENT:PERRL, sclerae not icteric. Oral mucosa dry without lesions. Neck  no JVD.  Lymphatics:no cervical,suraclavicular adenopathy Resp: no wheezes or rales, decreased BS lower fields Cardio:tachy,  regular rate and rhythm. No gallop. GI: soft, not distended, palpation epigastrium and LUQ not reproducing pain. Few bowel sounds.  Musculoskeletal/ Extremities: 1+ pedal edema, no cords or tenderness Neuro: speech fluent and appropriate, moves all extremities Skin very dry thruout without rash, ecchymosis, petechiae Portacath-without erythema or tenderness  Lab Results:  Results for orders placed in visit on 11/03/13  CBC WITH DIFFERENTIAL      Result Value Range   WBC 5.5  3.9 - 10.3 10e3/uL   NEUT# 4.0  1.5 - 6.5 10e3/uL   HGB 10.3 (*) 11.6 - 15.9 g/dL   HCT 45.4 (*) 09.8 - 11.9 %   Platelets 594 (*) 145 - 400 10e3/uL   MCV 95.0  79.5 - 101.0 fL   MCH 30.1  25.1 - 34.0 pg   MCHC 31.7  31.5 - 36.0 g/dL   RBC 1.47 (*) 8.29 - 5.62 10e6/uL   RDW 15.8 (*) 11.2 - 14.5 %   lymph# 0.6 (*) 0.9 - 3.3 10e3/uL   MONO# 0.8  0.1 - 0.9 10e3/uL   Eosinophils Absolute 0.0  0.0 - 0.5 10e3/uL   Basophils Absolute 0.1  0.0 - 0.1 10e3/uL   NEUT% 73.1  38.4 - 76.8 %   LYMPH% 11.3 (*) 14.0 - 49.7 %   MONO% 14.0  0.0 - 14.0 %   EOS% 0.5  0.0 -  7.0 %   BASO% 1.1  0.0 - 2.0 %   nRBC 0  0 - 0 %  COMPREHENSIVE METABOLIC PANEL (CC13)      Result Value Range   Sodium 134 (*) 136 - 145 mEq/L   Potassium 4.1  3.5 - 5.1 mEq/L   Chloride 99  98 - 109 mEq/L   CO2 27  22 - 29 mEq/L   Glucose 83  70 - 140 mg/dl   BUN 16.1  7.0 - 09.6 mg/dL   Creatinine 0.4 (*) 0.6 - 1.1 mg/dL   Total Bilirubin 0.45  0.20 - 1.20 mg/dL   Alkaline Phosphatase 240 (*) 40 - 150 U/L   AST 29  5 - 34 U/L   ALT 24  0 - 55 U/L   Total Protein 5.7 (*) 6.4 - 8.3 g/dL   Albumin 1.8 (*) 3.5 - 5.0  g/dL   Calcium 8.2 (*) 8.4 - 10.4 mg/dL   Anion Gap 9  3 - 11 mEq/L     Studies/Results:  No results found.  Medications: I have reviewed the patient's current medications. Continue methadone 30 mg q 8 hrs, and as below:  DISCUSSION: I have spoken directly with medical director at [facility] to discuss pain management. He agrees that methadone is at or at least very close to maximum dosing now and suggests increasing oxycodone instead for now. He mentions probable anxiety and other emotional/ psych factors contributing to the pain. He suggests could consider steroids (not evidence based), NSAID, low dose ativan (note QT prolongation).  He also agrees that Hospice will be appropriate as soon as chemo is discontinued.  Will increase oxycodone to 10-15 mg q 4 hrs via J tube and add naprosyn suspension 125 mg/5 ml bid with tube feedings.  Even with ongoing problems, patient requests that she receive chemotherapy as planned today. I cannot tell that she is actually worse today, so will proceed carefully as planned.  Assessment/Plan:  1. 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 She will have additional IVF when she comes for pump DC on 11-05-13.  If cannot tolerate chemo or progression, Hospice will be very helpful. Will increase oxycodone and add naprosyn suspension. . 2 chronic pancreatitis since cholecystectomy 1993, better in past 2 years post sphincterotomy and stents  3. On methadone maintenance for drug history, at 30 mg q 8 hrs prior to cancer diagnosis, continuing.  4. Erythema of distal feet: LE venous dopplers negative for DVT on 10-20-13. Has not resolved with keflex, not febrile. Using hypoallergenic lotion.  5. 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 Improving with Hydrocortisone cream  8.flu vaccine done  9.multiple sclerosis not more symptomatic presently  10.long QT syndrome  11.hypokalemia: resolved now on 20 mEq liquid daily, follow  12. DNR patient's request 13. long tobacco recently DCd  14.Severe protein calorie malnutrition: tolerance of tube feedigs improved  16.hx migraines but tolerates zofran  17.general weakness and debility related to above: PT/OT evaluation per [facility]    TIme spent 25 min including >50% counseling ad coordination of care    Brantleigh Mifflin P, MD   11/03/2013, 2:23 PM

## 2013-11-03 NOTE — Patient Instructions (Signed)
[facility] Discharge Instructions for Patients Receiving Chemotherapy  Today you received the following chemotherapy agents: Oxaliplatin, Leucovorin, 5FU, Zometa  To help prevent nausea and vomiting after your treatment, we encourage you to take your nausea medication as prescribed.    If you develop nausea and vomiting that is not controlled by your nausea medication, call the clinic.   BELOW ARE SYMPTOMS THAT SHOULD BE REPORTED IMMEDIATELY:  *FEVER GREATER THAN 100.5 F  *CHILLS WITH OR WITHOUT FEVER  NAUSEA AND VOMITING THAT IS NOT CONTROLLED WITH YOUR NAUSEA MEDICATION  *UNUSUAL SHORTNESS OF BREATH  *UNUSUAL BRUISING OR BLEEDING  TENDERNESS IN MOUTH AND THROAT WITH OR WITHOUT PRESENCE OF ULCERS  *URINARY PROBLEMS  *BOWEL PROBLEMS  UNUSUAL RASH Items with * indicate a potential emergency and should be followed up as soon as possible.  Feel free to call the clinic you have any questions or concerns. The clinic phone number is [phone].

## 2013-11-04 ENCOUNTER — Telehealth: Payer: Self-pay | Admitting: *Deleted

## 2013-11-04 NOTE — Telephone Encounter (Signed)
Notified RN, Dr Darrold Span is fine with continued [facility] visits once a week for 2 more weeks.

## 2013-11-05 ENCOUNTER — Other Ambulatory Visit: Payer: Self-pay | Admitting: Oncology

## 2013-11-05 ENCOUNTER — Other Ambulatory Visit: Payer: Self-pay | Admitting: *Deleted

## 2013-11-05 ENCOUNTER — Encounter (INDEPENDENT_AMBULATORY_CARE_PROVIDER_SITE_OTHER): Payer: Self-pay

## 2013-11-05 ENCOUNTER — Ambulatory Visit (HOSPITAL_BASED_OUTPATIENT_CLINIC_OR_DEPARTMENT_OTHER): Payer: Medicare Other

## 2013-11-05 ENCOUNTER — Encounter: Payer: Self-pay | Admitting: Oncology

## 2013-11-05 ENCOUNTER — Ambulatory Visit (HOSPITAL_BASED_OUTPATIENT_CLINIC_OR_DEPARTMENT_OTHER): Payer: Medicare Other | Admitting: Oncology

## 2013-11-05 VITALS — BP 112/78 | HR 111 | Temp 98.2°F

## 2013-11-05 DIAGNOSIS — E43 Unspecified severe protein-calorie malnutrition: Secondary | ICD-10-CM

## 2013-11-05 DIAGNOSIS — C155 Malignant neoplasm of lower third of esophagus: Secondary | ICD-10-CM

## 2013-11-05 DIAGNOSIS — K861 Other chronic pancreatitis: Secondary | ICD-10-CM

## 2013-11-05 DIAGNOSIS — R0989 Other specified symptoms and signs involving the circulatory and respiratory systems: Secondary | ICD-10-CM

## 2013-11-05 DIAGNOSIS — R110 Nausea: Secondary | ICD-10-CM

## 2013-11-05 DIAGNOSIS — C159 Malignant neoplasm of esophagus, unspecified: Secondary | ICD-10-CM

## 2013-11-05 DIAGNOSIS — R109 Unspecified abdominal pain: Secondary | ICD-10-CM

## 2013-11-05 MED ORDER — GUAIFENESIN 100 MG/5ML PO SOLN: 10.0000 mL | ORAL | Status: AC | PRN

## 2013-11-05 MED ORDER — PROMETHAZINE HCL 6.25 MG/5ML PO SYRP: 12.5000 mg | ORAL_SOLUTION | Freq: Four times a day (QID) | ORAL | Status: AC | PRN

## 2013-11-05 MED ORDER — SODIUM CHLORIDE 0.9 % IJ SOLN
10.0000 mL | INTRAMUSCULAR | Status: DC | PRN
  Administered 2013-11-05: 10 mL
  Filled 2013-11-05: qty 10

## 2013-11-05 MED ORDER — HEPARIN SOD (PORK) LOCK FLUSH 100 UNIT/ML IV SOLN
500.0000 [IU] | Freq: Once | INTRAVENOUS | Status: AC | PRN
  Administered 2013-11-05: 500 [IU]
  Filled 2013-11-05: qty 5

## 2013-11-05 MED ORDER — SODIUM CHLORIDE 0.9 % IV SOLN
Freq: Once | INTRAVENOUS | Status: AC
  Administered 2013-11-05: 14:00:00 via INTRAVENOUS

## 2013-11-05 MED ORDER — ONDANSETRON 16 MG/50ML IVPB (CHCC)
INTRAVENOUS | Status: AC
  Filled 2013-11-05: qty 16

## 2013-11-05 MED ORDER — GUAIFENESIN ER 600 MG PO TB12: 600.0000 mg | ORAL_TABLET | Freq: Two times a day (BID) | ORAL | Status: DC

## 2013-11-05 MED ORDER — ONDANSETRON 16 MG/50ML IVPB (CHCC)
16.0000 mg | Freq: Once | INTRAVENOUS | Status: AC | PRN
  Administered 2013-11-05: 16 mg via INTRAVENOUS

## 2013-11-05 MED ORDER — HYDROMORPHONE HCL PF 4 MG/ML IJ SOLN
INTRAMUSCULAR | Status: AC
  Filled 2013-11-05: qty 1

## 2013-11-05 MED ORDER — HYDROMORPHONE HCL PF 4 MG/ML IJ SOLN
0.5000 mg | Freq: Once | INTRAMUSCULAR | Status: AC
  Administered 2013-11-05: 0.5 mg via INTRAVENOUS

## 2013-11-05 NOTE — Progress Notes (Signed)
OFFICE PROGRESS NOTE   11/05/2013   Physicians: Jeani Hawking, Fleet Contras (PCP), Lonie Peak, Claud Kelp, Bindubal Balan, Trudie Buckler ([place] [place], neurology), Reddy/ Dartha Lodge NP ([facility]), K.Hilty   INTERVAL HISTORY:  Patient is seen in infusion area, for unscheduled visit, here for pump DC with additional IVF following cycle 2 FOLFOX this week. She is more nauseated today despite zofran at home tho has not vomited, has not tolerated tube feedings in last day, has had better pain relief with increased oxycodone but is complaining of more pain around upper abdomen now. She has some cough minimally productive of yellowish sputum, no fever, no aches, no increased SOB.    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 She has been treated to this point with RT to distal esophagus and T9-11 by Dr Basilio Cairo in Oct 2014 and began FOLFOX in palliative attempt in early Dec 2014 after jejunostomy placement and tube feedings tolerated. She has history of chronic pancreatitis and substance abuse, already on methadone 30 mg tid when the esophageal cancer was diagnosed. Code status is DNR however she is not yet under Hospice care due to treatment being attempted with chemotherapy in palliative attempt. She had possible pneumonia in Sept and has had sensitive, erythematous left foot without DVT. PMH also significant for MS. She has PAC   Review of systems as above, also: Puritic rash wrists bilaterally a little better. Skin extremely dry as previously. Bowels moving, not diarrhea. Feet no change. Remainder of 10 point Review of Systems negative.  Objective:  Vital signs in last 24 hours: 112/78, 111 regular, 98.2, O2 sat 100%.   Alert, oriented and appropriate.Appears chronically ill, cachectic but NAD. Able to reposition herself in recliner for exam. Respirations not labored RA,  no cough during exam.  HEENT:PERRL, sclerae not icteric. Oral mucosa somewhat dry without lesions, posterior pharynx clear.  Neck supple. No JVD.  Lymphatics:no cervical,suraclavicular adenopathy Resp: No wheezes, rales, crackles. Decreased BS lower fields L moreso than right. No use of accessory muscles. Cardio: regular rate and rhythm. No gallop. GI: soft, nontender, not distended, J tube dressing clean Musculoskeletal/ Extremities: no pedal edema. Little muscles mass in extremities. Neuro: nonfocal on exam just in recliner in infusion. Speech fluent.  Skin without ecchymosis, petechiae. Very dry thruout. Excoriated areas on wrists resolving tho still noticeable, no apparent suprainfection Portacath-without erythema or tenderness  Lab Results:  Results for orders placed in visit on 11/03/13  CBC WITH DIFFERENTIAL      Result Value Range   WBC 5.5  3.9 - 10.3 10e3/uL   NEUT# 4.0  1.5 - 6.5 10e3/uL   HGB 10.3 (*) 11.6 - 15.9 g/dL   HCT 16.1 (*) 09.6 - 04.5 %   Platelets 594 (*) 145 - 400 10e3/uL   MCV 95.0  79.5 - 101.0 fL   MCH 30.1  25.1 - 34.0 pg   MCHC 31.7  31.5 - 36.0 g/dL   RBC 4.09 (*) 8.11 - 9.14 10e6/uL   RDW 15.8 (*) 11.2 - 14.5 %   lymph# 0.6 (*) 0.9 - 3.3 10e3/uL   MONO# 0.8  0.1 - 0.9 10e3/uL   Eosinophils Absolute 0.0  0.0 - 0.5 10e3/uL   Basophils Absolute 0.1  0.0 - 0.1 10e3/uL   NEUT% 73.1  38.4 - 76.8 %   LYMPH% 11.3 (*) 14.0 - 49.7 %   MONO% 14.0  0.0 - 14.0 %  EOS% 0.5  0.0 - 7.0 %   BASO% 1.1  0.0 - 2.0 %   nRBC 0  0 - 0 %  COMPREHENSIVE METABOLIC PANEL (CC13)      Result Value Range   Sodium 134 (*) 136 - 145 mEq/L   Potassium 4.1  3.5 - 5.1 mEq/L   Chloride 99  98 - 109 mEq/L   CO2 27  22 - 29 mEq/L   Glucose 83  70 - 140 mg/dl   BUN 21.3  7.0 - 08.6 mg/dL   Creatinine 0.4 (*) 0.6 - 1.1 mg/dL   Total Bilirubin 5.78  0.20 - 1.20 mg/dL   Alkaline Phosphatase 240 (*) 40 - 150 U/L   AST 29  5 - 34 U/L   ALT 24  0 - 55 U/L   Total Protein 5.7 (*)  6.4 - 8.3 g/dL   Albumin 1.8 (*) 3.5 - 5.0 g/dL   Calcium 8.2 (*) 8.4 - 10.4 mg/dL   Anion Gap 9  3 - 11 mEq/L     Studies/Results:  No results found.  Medications: I have reviewed the patient's current medications. Will add guaifenesin liquid via J tube and prn phenergan syrup 12.5 mg every 6 hrs prn nausea (this chosen in preference to ativan due to prolonged QT concern with methadone). She is given zofran 16 mg IV with 1 liter IV NS now. If cough worsens would consider azithromycin if not allergic (note allergy listed to emycin, tho I do not know what type allergy). She was given dilaudid IV x 1 dose now.  DISCUSSION: patient in agreement with trying measures above to improve symptoms as outpatient. She understands that she can be seen in ED if problems worsen prior to office reopening on 11-07-12.   Assessment/Plan: 1. 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 FOLFOX in palliative attempt begun 10-13-13, doses reduced due to poor performance status. Cycle 2 FOLFOX 11-03-13 without dose increase. Counts maintained with doses used cycle 1. IVF with IV antiemetics now. Keep follow up appointments as scheduled. If cannot tolerate chemo or progression, Hospice will be very helpful  . 2 chronic pancreatitis since cholecystectomy 1993, better in past 2 years post sphincterotomy and stents  3. On methadone maintenance for drug history: prescriptions for the methadone from this office now rather than from Dr Betti Cruz. Liquid preferable for J tube administration if that is available.  4. Erythema of distal feet: LE venous dopplers negative for DVT on 10-20-13. Has not resolved with total 2 weeks keflex. Using hypoallergenic lotion now.  5. 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 Improving with  Hydrocortisone cream  8.flu vaccine done  9.multiple sclerosis not more symptomatic presently  10.long QT syndrome  11.hypokalemia: resolved now on 20 mEq liquid daily, follow  12. Interstitial pneumonia treated during hospitalization in Sept and radiographic pneumonia also treated during hospitalization in Nov. Slight cough now, follow as above + guaifenesin 13. long tobacco recently DCd  14.Code status DNR at patient's request  15. Severe protein calorie malnutrition related to inability to swallow: tube feedings not tolerated in past 24 hours, with increased nausea likely related to this cycle FOLFOX; had been at goal rate just prior to cycle 2 FOLFOX. Appreciate ongoing follow up by [facility] dietician. [facility] managing equipment.  16.hx  migraines but tolerates zofran  17.general weakness and debility related to above: PT/OT evaluation per [facility].         LIVESAY,LENNIS P, MD   11/05/2013, 8:25 PM

## 2013-11-05 NOTE — Patient Instructions (Signed)
Dehydration, Adult  Dehydration means your body does not have as much fluid as it needs. Your kidneys, brain, and heart will not work properly without the right amount of fluids and salt.   HOME CARE   Ask your doctor how to replace body fluid losses (rehydrate).   Drink enough fluids to keep your pee (urine) clear or pale yellow.   Drink small amounts of fluids often if you feel sick to your stomach (nauseous) or throw up (vomit).   Eat like you normally do.   Avoid:   Foods or drinks high in sugar.   Bubbly (carbonated) drinks.   Juice.   Very hot or cold fluids.   Drinks with caffeine.   Fatty, greasy foods.   Alcohol.   Tobacco.   Eating too much.   Gelatin desserts.   Wash your hands to avoid spreading germs (bacteria, viruses).   Only take medicine as told by your doctor.   Keep all doctor visits as told.  GET HELP RIGHT AWAY IF:    You cannot drink something without throwing up.   You get worse even with treatment.   Your vomit has blood in it or looks greenish.   Your poop (stool) has blood in it or looks black and tarry.   You have not peed in 6 to 8 hours.   You pee a small amount of very dark pee.   You have a fever.   You pass out (faint).   You have belly (abdominal) pain that gets worse or stays in one spot (localizes).   You have a rash, stiff neck, or bad headache.   You get easily annoyed, sleepy, or are hard to wake up.   You feel weak, dizzy, or very thirsty.  MAKE SURE YOU:    Understand these instructions.   Will watch your condition.   Will get help right away if you are not doing well or get worse.  Document Released: 08/19/2009 Document Revised: 01/15/2012 Document Reviewed: 06/12/2011  [employer] Patient Information 2014 [employer].

## 2013-11-05 NOTE — Progress Notes (Signed)
Patient states she wants to go home. Patient does not want to finish her fluids.

## 2013-11-06 ENCOUNTER — Inpatient Hospital Stay (HOSPITAL_COMMUNITY)
Admission: EM | Admit: 2013-11-06 | Discharge: 2013-12-07 | DRG: 377 | Disposition: E | Payer: Medicare Other | Attending: Internal Medicine | Admitting: Internal Medicine

## 2013-11-06 ENCOUNTER — Emergency Department (HOSPITAL_COMMUNITY): Payer: Medicare Other

## 2013-11-06 ENCOUNTER — Encounter (HOSPITAL_COMMUNITY): Payer: Self-pay | Admitting: Emergency Medicine

## 2013-11-06 DIAGNOSIS — R05 Cough: Secondary | ICD-10-CM | POA: Clinically undetermined

## 2013-11-06 DIAGNOSIS — Z87891 Personal history of nicotine dependence: Secondary | ICD-10-CM

## 2013-11-06 DIAGNOSIS — E876 Hypokalemia: Secondary | ICD-10-CM

## 2013-11-06 DIAGNOSIS — Z923 Personal history of irradiation: Secondary | ICD-10-CM

## 2013-11-06 DIAGNOSIS — F1120 Opioid dependence, uncomplicated: Secondary | ICD-10-CM

## 2013-11-06 DIAGNOSIS — Z79899 Other long term (current) drug therapy: Secondary | ICD-10-CM

## 2013-11-06 DIAGNOSIS — J90 Pleural effusion, not elsewhere classified: Secondary | ICD-10-CM

## 2013-11-06 DIAGNOSIS — Z515 Encounter for palliative care: Secondary | ICD-10-CM

## 2013-11-06 DIAGNOSIS — C155 Malignant neoplasm of lower third of esophagus: Secondary | ICD-10-CM

## 2013-11-06 DIAGNOSIS — C7889 Secondary malignant neoplasm of other digestive organs: Secondary | ICD-10-CM | POA: Diagnosis present

## 2013-11-06 DIAGNOSIS — C787 Secondary malignant neoplasm of liver and intrahepatic bile duct: Secondary | ICD-10-CM | POA: Diagnosis present

## 2013-11-06 DIAGNOSIS — IMO0002 Reserved for concepts with insufficient information to code with codable children: Secondary | ICD-10-CM

## 2013-11-06 DIAGNOSIS — E0500 Thyrotoxicosis with diffuse goiter without thyrotoxic crisis or storm: Secondary | ICD-10-CM

## 2013-11-06 DIAGNOSIS — R059 Cough, unspecified: Secondary | ICD-10-CM

## 2013-11-06 DIAGNOSIS — Z66 Do not resuscitate: Secondary | ICD-10-CM | POA: Diagnosis present

## 2013-11-06 DIAGNOSIS — K921 Melena: Principal | ICD-10-CM | POA: Diagnosis present

## 2013-11-06 DIAGNOSIS — E871 Hypo-osmolality and hyponatremia: Secondary | ICD-10-CM | POA: Diagnosis present

## 2013-11-06 DIAGNOSIS — F329 Major depressive disorder, single episode, unspecified: Secondary | ICD-10-CM | POA: Diagnosis present

## 2013-11-06 DIAGNOSIS — K861 Other chronic pancreatitis: Secondary | ICD-10-CM | POA: Diagnosis present

## 2013-11-06 DIAGNOSIS — C7952 Secondary malignant neoplasm of bone marrow: Secondary | ICD-10-CM

## 2013-11-06 DIAGNOSIS — C159 Malignant neoplasm of esophagus, unspecified: Secondary | ICD-10-CM

## 2013-11-06 DIAGNOSIS — F411 Generalized anxiety disorder: Secondary | ICD-10-CM | POA: Diagnosis present

## 2013-11-06 DIAGNOSIS — L03039 Cellulitis of unspecified toe: Secondary | ICD-10-CM

## 2013-11-06 DIAGNOSIS — L02619 Cutaneous abscess of unspecified foot: Secondary | ICD-10-CM | POA: Diagnosis present

## 2013-11-06 DIAGNOSIS — F17200 Nicotine dependence, unspecified, uncomplicated: Secondary | ICD-10-CM | POA: Diagnosis present

## 2013-11-06 DIAGNOSIS — F1110 Opioid abuse, uncomplicated: Secondary | ICD-10-CM | POA: Diagnosis present

## 2013-11-06 DIAGNOSIS — D62 Acute posthemorrhagic anemia: Secondary | ICD-10-CM

## 2013-11-06 DIAGNOSIS — E039 Hypothyroidism, unspecified: Secondary | ICD-10-CM | POA: Diagnosis present

## 2013-11-06 DIAGNOSIS — G35 Multiple sclerosis: Secondary | ICD-10-CM | POA: Diagnosis present

## 2013-11-06 DIAGNOSIS — E8809 Other disorders of plasma-protein metabolism, not elsewhere classified: Secondary | ICD-10-CM | POA: Diagnosis present

## 2013-11-06 DIAGNOSIS — D500 Iron deficiency anemia secondary to blood loss (chronic): Secondary | ICD-10-CM | POA: Diagnosis present

## 2013-11-06 DIAGNOSIS — E43 Unspecified severe protein-calorie malnutrition: Secondary | ICD-10-CM

## 2013-11-06 DIAGNOSIS — Z934 Other artificial openings of gastrointestinal tract status: Secondary | ICD-10-CM

## 2013-11-06 DIAGNOSIS — G8929 Other chronic pain: Secondary | ICD-10-CM | POA: Diagnosis present

## 2013-11-06 DIAGNOSIS — G35D Multiple sclerosis, unspecified: Secondary | ICD-10-CM | POA: Diagnosis present

## 2013-11-06 DIAGNOSIS — K922 Gastrointestinal hemorrhage, unspecified: Secondary | ICD-10-CM

## 2013-11-06 DIAGNOSIS — C7951 Secondary malignant neoplasm of bone: Secondary | ICD-10-CM | POA: Diagnosis present

## 2013-11-06 DIAGNOSIS — F3289 Other specified depressive episodes: Secondary | ICD-10-CM | POA: Diagnosis present

## 2013-11-06 LAB — CBC WITH DIFFERENTIAL/PLATELET
BASOS PCT: 0 % (ref 0–1)
Basophils Absolute: 0 10*3/uL (ref 0.0–0.1)
EOS PCT: 0 % (ref 0–5)
Eosinophils Absolute: 0 10*3/uL (ref 0.0–0.7)
HCT: 21.9 % — ABNORMAL LOW (ref 36.0–46.0)
HEMOGLOBIN: 7.2 g/dL — AB (ref 12.0–15.0)
LYMPHS ABS: 0.7 10*3/uL (ref 0.7–4.0)
Lymphocytes Relative: 13 % (ref 12–46)
MCH: 30.4 pg (ref 26.0–34.0)
MCHC: 32.9 g/dL (ref 30.0–36.0)
MCV: 92.4 fL (ref 78.0–100.0)
Monocytes Absolute: 0.1 10*3/uL (ref 0.1–1.0)
Monocytes Relative: 2 % — ABNORMAL LOW (ref 3–12)
Neutro Abs: 4.2 10*3/uL (ref 1.7–7.7)
Neutrophils Relative %: 85 % — ABNORMAL HIGH (ref 43–77)
PLATELETS: ADEQUATE 10*3/uL (ref 150–400)
RBC: 2.37 MIL/uL — AB (ref 3.87–5.11)
RDW: 15.8 % — ABNORMAL HIGH (ref 11.5–15.5)
WBC: 5 10*3/uL (ref 4.0–10.5)

## 2013-11-06 LAB — COMPREHENSIVE METABOLIC PANEL
ALT: 20 U/L (ref 0–35)
AST: 43 U/L — ABNORMAL HIGH (ref 0–37)
Albumin: 1.8 g/dL — ABNORMAL LOW (ref 3.5–5.2)
Alkaline Phosphatase: 169 U/L — ABNORMAL HIGH (ref 39–117)
BUN: 24 mg/dL — ABNORMAL HIGH (ref 6–23)
CO2: 26 meq/L (ref 19–32)
CREATININE: 0.35 mg/dL — AB (ref 0.50–1.10)
Calcium: 7.1 mg/dL — ABNORMAL LOW (ref 8.4–10.5)
Chloride: 97 mEq/L (ref 96–112)
GFR calc Af Amer: >90 mL/min (ref >90)
Glucose, Bld: 94 mg/dL (ref 70–99)
Potassium: 3.8 mEq/L (ref 3.7–5.3)
Sodium: 134 mEq/L — ABNORMAL LOW (ref 137–147)
TOTAL PROTEIN: 5.5 g/dL — AB (ref 6.0–8.3)
Total Bilirubin: <0.2 mg/dL — ABNORMAL LOW (ref 0.3–1.2)

## 2013-11-06 LAB — URINALYSIS, ROUTINE W REFLEX MICROSCOPIC
BILIRUBIN URINE: NEGATIVE
Glucose, UA: NEGATIVE mg/dL
Hgb urine dipstick: NEGATIVE
KETONES UR: 15 mg/dL — AB
Leukocytes, UA: NEGATIVE
NITRITE: NEGATIVE
Protein, ur: NEGATIVE mg/dL
Specific Gravity, Urine: 1.034 — ABNORMAL HIGH (ref 1.005–1.030)
UROBILINOGEN UA: 0.2 mg/dL (ref 0.0–1.0)
pH: 5.5 (ref 5.0–8.0)

## 2013-11-06 LAB — ABO/RH: ABO/RH(D): B POS

## 2013-11-06 LAB — LIPASE, BLOOD: Lipase: 39 U/L (ref 11–59)

## 2013-11-06 LAB — OCCULT BLOOD, POC DEVICE: Fecal Occult Bld: POSITIVE — AB

## 2013-11-06 LAB — PREPARE RBC (CROSSMATCH)

## 2013-11-06 MED ORDER — METHADONE HCL 10 MG PO TABS
30.0000 mg | ORAL_TABLET | Freq: Three times a day (TID) | ORAL | Status: DC
  Administered 2013-11-06 – 2013-11-09 (×9): 30 mg
  Filled 2013-11-06 (×10): qty 3

## 2013-11-06 MED ORDER — ACETAMINOPHEN 325 MG PO TABS: 650.0000 mg | ORAL_TABLET | Freq: Four times a day (QID) | ORAL | Status: DC | PRN

## 2013-11-06 MED ORDER — OXYCODONE HCL 5 MG/5ML PO SOLN
5.0000 mg | Freq: Four times a day (QID) | ORAL | Status: DC | PRN
  Administered 2013-11-06 – 2013-11-08 (×3): 5 mg
  Filled 2013-11-06 (×3): qty 5

## 2013-11-06 MED ORDER — SODIUM CHLORIDE 0.9 % IV BOLUS (SEPSIS): 1000.0000 mL | Freq: Once | INTRAVENOUS | Status: DC

## 2013-11-06 MED ORDER — PROMETHAZINE HCL 25 MG/ML IJ SOLN
12.5000 mg | Freq: Once | INTRAMUSCULAR | Status: DC
  Filled 2013-11-06: qty 1

## 2013-11-06 MED ORDER — HYDROMORPHONE HCL PF 1 MG/ML IJ SOLN
2.0000 mg | Freq: Once | INTRAMUSCULAR | Status: AC
  Administered 2013-11-06: 2 mg via INTRAVENOUS
  Filled 2013-11-06: qty 2

## 2013-11-06 MED ORDER — LEVOTHYROXINE SODIUM 100 MCG PO TABS
100.0000 ug | ORAL_TABLET | Freq: Every day | ORAL | Status: DC
  Administered 2013-11-07 – 2013-11-08 (×2): 100 ug
  Filled 2013-11-06 (×4): qty 1

## 2013-11-06 MED ORDER — PROMETHAZINE HCL 25 MG/ML IJ SOLN
12.5000 mg | Freq: Once | INTRAMUSCULAR | Status: AC
  Administered 2013-11-06: 12.5 mg via INTRAMUSCULAR
  Filled 2013-11-06: qty 1

## 2013-11-06 MED ORDER — SODIUM CHLORIDE 0.9 % IV SOLN
8.0000 mg/h | INTRAVENOUS | Status: DC
  Administered 2013-11-07 (×2): 8 mg/h via INTRAVENOUS
  Filled 2013-11-06 (×5): qty 80

## 2013-11-06 MED ORDER — HYDROMORPHONE HCL PF 1 MG/ML IJ SOLN
1.0000 mg | INTRAMUSCULAR | Status: DC | PRN
  Administered 2013-11-06 – 2013-11-07 (×2): 1 mg via INTRAVENOUS
  Filled 2013-11-06 (×2): qty 1

## 2013-11-06 MED ORDER — PANTOPRAZOLE SODIUM 40 MG IV SOLR
40.0000 mg | Freq: Once | INTRAVENOUS | Status: AC
  Administered 2013-11-06: 40 mg via INTRAVENOUS
  Filled 2013-11-06: qty 40

## 2013-11-06 MED ORDER — HYDROMORPHONE HCL PF 1 MG/ML IJ SOLN
1.0000 mg | Freq: Once | INTRAMUSCULAR | Status: AC
  Administered 2013-11-06: 1 mg via INTRAVENOUS
  Filled 2013-11-06: qty 1

## 2013-11-06 MED ORDER — SODIUM CHLORIDE 0.9 % IV BOLUS (SEPSIS)
1000.0000 mL | Freq: Once | INTRAVENOUS | Status: AC
  Administered 2013-11-06: 1000 mL via INTRAVENOUS

## 2013-11-06 MED ORDER — ONDANSETRON HCL 4 MG PO TABS
4.0000 mg | ORAL_TABLET | Freq: Four times a day (QID) | ORAL | Status: DC | PRN
Start: 2013-11-06 — End: 2013-11-07

## 2013-11-06 MED ORDER — ONDANSETRON HCL 4 MG/2ML IJ SOLN
4.0000 mg | Freq: Four times a day (QID) | INTRAMUSCULAR | Status: DC | PRN
  Administered 2013-11-06: 4 mg via INTRAVENOUS
  Filled 2013-11-06: qty 2

## 2013-11-06 MED ORDER — SODIUM CHLORIDE 0.9 % IV SOLN
80.0000 mg | Freq: Once | INTRAVENOUS | Status: DC
  Filled 2013-11-06: qty 80

## 2013-11-06 MED ORDER — METHADONE HCL 5 MG/5ML PO SOLN: 30.0000 mg | Freq: Three times a day (TID) | ORAL | Status: DC

## 2013-11-06 MED ORDER — PANTOPRAZOLE SODIUM 40 MG IV SOLR: 40.0000 mg | Freq: Two times a day (BID) | INTRAVENOUS | Status: DC

## 2013-11-06 MED ORDER — ACETAMINOPHEN 650 MG RE SUPP: 650.0000 mg | Freq: Four times a day (QID) | RECTAL | Status: DC | PRN

## 2013-11-06 MED ORDER — ONDANSETRON HCL 4 MG/2ML IJ SOLN
4.0000 mg | Freq: Once | INTRAMUSCULAR | Status: AC
  Administered 2013-11-06: 4 mg via INTRAVENOUS
  Filled 2013-11-06: qty 2

## 2013-11-06 NOTE — ED Notes (Signed)
Pt reports blood in stool x 1 today. Pt c/o abdominal pain with nausea and vomiting. Pt is a stage 4 cancer pt.

## 2013-11-06 NOTE — ED Provider Notes (Signed)
CSN: [id number]     Arrival date & time 11/10/2013  1351 History   First MD Initiated Contact with Patient 12/04/2013 1502     Chief Complaint  Patient presents with  . Rectal Bleeding   (Consider location/radiation/quality/duration/timing/severity/associated sxs/prior Treatment) HPI Comments: Patient with a history of Stage IV distal esophageal/ GE junction adenocarcinoma diagnosed in September presents today with a chief complaint of abdominal pain and rectal bleeding.  She reports that she began having abdominal pain two days ago.  Pain is generalized, but worse in the LUQ.  Pain does not radiate.  She is currently on Methadone and Oxycodone for pain, but does not feel that it is helping.   She states that she does have chronic abdominal pain, but feels that this pain is worse than her typical pain.  She reports that she had an episode of loose stool this morning and noticed some dark red blood mixed in with her stool.  No other rectal bleeding.  Denies melena.  She reports that she is feeling nauseous, but denies vomiting.  She denies fever or chills.  She reports associated weakness, but states that she always feels weak.  Denies chest pain or SOB.  She is currently not on any anticoagulation medication.  She is currently undergoing chemotherapy for her cancer.    The history is provided by the patient.    Past Medical History  Diagnosis Date  . Multiple sclerosis   . Irritable bowel syndrome   . Arthritis   . Pancreatitis   . Anxiety   . Depression   . Chronic pain   . Migraine   . PTSD (post-traumatic stress disorder)   . Methadone use   . PONV (postoperative nausea and vomiting)   . 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 Grave's disease   . hypothyroidism   . Neuromuscular disorder   . Substance abuse    Past Surgical History  Procedure Laterality Date  . Sphincterotomy    . Cholecystectomy    . Eus N/A 08/08/2013    Procedure: ESOPHAGEAL ENDOSCOPIC ULTRASOUND  (EUS) RADIAL;  Surgeon: Beryle Beams, MD;  Location: [facility];  Service: Endoscopy;  Laterality: N/A;  . 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 Hernia repair    . Gastrostomy N/A 09/17/2013    Procedure: FEEDING JEJUNOSTOMY TUBE PLACEMENT;  Surgeon: Adin Hector, MD;  Location: [place];  Service: General;  Laterality: N/A;   Family History  Problem Relation Age of Onset  . Cancer Mother     ovarian, colon, breast  . Hypertension Father   . Cancer Maternal Uncle     lung  . Cancer Maternal Grandmother     lung   History  Substance Use Topics  . Smoking status: Former Smoker -- 0.50 packs/day for 8 years    Quit date: 09/12/2013  . Smokeless tobacco: Never Used  . Alcohol Use: No   OB History   Grav Para Term Preterm Abortions TAB SAB Ect Mult Living                 Review of Systems  Constitutional: Negative for fever and chills.  Gastrointestinal: Positive for nausea, abdominal pain, diarrhea and blood in stool.  All other systems reviewed and are negative.    Allergies  Fentanyl; Erythromycin; and Morphine and related  Home Medications   Current Outpatient Rx  Name  Route  Sig  Dispense  Refill  . busPIRone (BUSPAR) 15 MG tablet   Tube   Give 15 mg by tube 2 (two) times daily.          . cephALEXin (KEFLEX) 250 MG/5ML suspension   Per Tube   Place 10 mLs (500 mg total) into feeding tube 3 (three) times daily. X 7 DAYS.   210 mL   0   . diphenhydrAMINE (BENADRYL) 12.5 MG/5ML elixir   Per Tube   Place 25 mg into feeding tube 4 (four) times daily as needed.         Marland Kitchen escitalopram (LEXAPRO) 10 MG tablet   Tube   Give 30 mg by tube daily after supper.          . esomeprazole (NEXIUM) 40 MG packet   Jejunal Tube   40 mg by Jejunal Tube route daily before breakfast.         . fluconazole (DIFLUCAN) 40 MG/ML suspension   Per Tube   Place 2.5 mLs (100 mg total) into feeding  tube daily.   18 mL   0   . guaiFENesin (ROBITUSSIN) 100 MG/5ML SOLN   Oral   Take 10 mLs (200 mg total) by mouth every 4 (four) hours as needed for cough or to loosen phlegm.   1200 mL   1   . hydrocortisone cream 1 %   Topical   Apply 1 application topically 2 (two) times daily. Apply to affected areas on arms for 5 to 7 days.   30 g   0   . imipramine (TOFRANIL) 50 MG tablet   Tube   Give 100 mg by tube at bedtime.          Marland Kitchen levothyroxine (SYNTHROID, LEVOTHROID) 100 MCG tablet   Tube   Give 100 mcg by tube daily before breakfast.          . lidocaine-prilocaine (EMLA) cream      Apply 1-2 hours prior to Porta-Cath accessed   30 g   2   . Melatonin 5 MG TABS   Tube   Give 5 mg by tube at bedtime.          . methadone (DOLOPHINE) 10 MG tablet   Per Tube   Place 3 tablets (30 mg total) into feeding tube every 8 (eight) hours.   9 tablet   0   . methadone (DOLOPHINE) 5 MG/5ML solution   Per Tube   Place 30 mLs (30 mg total) into feeding tube every 8 (eight) hours.   1000 mL   0   . naproxen (NAPROSYN) 125 MG/5ML suspension      125mg /5 mls twice a day with tube feedings   150 mL   0   . nicotine (NICODERM CQ - DOSED IN MG/24 HOURS) 14 mg/24hr patch   Transdermal   Place 1 patch (14 mg total) onto the skin daily.   30 patch   2   . nystatin (MYCOSTATIN) 100000 UNIT/ML suspension               . ondansetron (ZOFRAN ODT) 8 MG disintegrating tablet   Oral   Take 1 tablet (8 mg total) by mouth every 8 (eight) hours as needed for nausea or vomiting.   30 tablet   3   . oxyCODONE (ROXICODONE) 5 MG/5ML solution      10-15 mg every 4 hours into feeding tube as needed for pain   750 mL   0   .  potassium chloride 20 MEQ/15ML (10%) solution   Per Tube   Place 15 mLs (20 mEq total) into feeding tube 2 (two) times daily.   1000 mL   1   . promethazine (PHENERGAN) 6.25 MG/5ML syrup   Oral   Take 10 mLs (12.5 mg total) by mouth every 6 (six)  hours as needed for nausea or vomiting.   200 mL   0   . sucralfate (CARAFATE) 1 GM/10ML suspension   Oral   Take 10 mLs (1 g total) by mouth 4 (four) times daily -  with meals and at bedtime.   420 mL   2    BP 107/72  Pulse 101  Temp(Src) 98 F (36.7 C) (Oral)  Resp 18  Ht 5\' 3"  (1.6 m)  Wt 113 lb (51.256 kg)  BMI 20.02 kg/m2  SpO2 96%  LMP 04/28/2011 Physical Exam  Nursing note and vitals reviewed. Constitutional:  Patient emaciated appearing  HENT:  Head: Normocephalic and atraumatic.  Mouth/Throat: Uvula is midline. Mucous membranes are dry.  Neck: Normal range of motion. Neck supple.  Cardiovascular: Normal rate, regular rhythm and normal heart sounds.   Pulmonary/Chest: Effort normal and breath sounds normal.  Abdominal: Soft. Bowel sounds are normal. She exhibits no distension and no mass. There is generalized tenderness. There is no rebound and no guarding.  Genitourinary:  Dark red blood on the glove mixed with stool with rectal exam.  Musculoskeletal: Normal range of motion.  Neurological: She is alert.  Skin: Skin is warm and dry.  Psychiatric: She has a normal mood and affect.    ED Course  Procedures (including critical care time) Labs Review Labs Reviewed  URINALYSIS, ROUTINE W REFLEX MICROSCOPIC  CBC WITH DIFFERENTIAL  COMPREHENSIVE METABOLIC PANEL  LIPASE, BLOOD  OCCULT BLOOD X 1 CARD TO LAB, STOOL  TYPE AND SCREEN   Imaging Review No results found.  EKG Interpretation   None     5:30 PM Reassessed patient.  She reports that her pain has improved after pain medication.    5:49 PM Discussed with Dr. Hilarie Fredrickson with GI.  He recommends blood transfusion.  6:17 PM Discussed with Dr Allyson Sabal with [facility] who has agreed to admit the patient.    CRITICAL CARE Performed by: Hyman Bible   Total critical care time: 30 minutes  Critical care time was exclusive of separately billable procedures and treating other  patients.  Critical care was necessary to treat or prevent imminent or life-threatening deterioration.  Critical care was time spent personally by me on the following activities: development of treatment plan with patient and/or surrogate as well as nursing, discussions with consultants, evaluation of patient's response to treatment, examination of patient, obtaining history from patient or surrogate, ordering and performing treatments and interventions, ordering and review of laboratory studies, ordering and review of radiographic studies, pulse oximetry and re-evaluation of patient's condition.  MDM  No diagnosis found. Patient with a history of Stage IV Distal Esophageal/GE junction Adenocarcinoma presents today with a chief complaint of abdominal pain and rectal bleeding.  Abdominal pain is generalized.  No rebound or guarding.  CT abdomen/pelvis pending.  Patient also with GI bleed.  Dark red blood visualized in stool.  Patient is not vomiting.  Patient is hemodynamically stable.  Hemoglobin has dropped 3 grams in the past 3 days.  Hemoglobin in the ED is 7.2.  Patient given blood transfusion in the ED with two units PRBC's.  Patient started on IV Protonix.  GI  consulted and recommended that the patient be admitted to [facility] and they will consult.  [facility] also consulted and agreed to admit the patient.      Hyman Bible, PA-C 11/08/13 1807

## 2013-11-06 NOTE — ED Notes (Signed)
PHYSICIAN NOTIFIED OF LAB RESULTS FOR OCCULT STOOL CARD = POSITIVE ( + ) @ 17: 55 PM ,14-Nov-2013.

## 2013-11-06 NOTE — H&P (Addendum)
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 Status post is radiation therapy to distal esophagus. Currently undergoing chemotherapy. Started FOLFOX in palliative attempt in early Dec 2014 after jejunostomy placement and tube feedings tolerated. She has history of chronic pancreatitis and substance abuse, already on methadone 30 mg tid when the esophageal cancer was diagnosed. Code status is DNR , being admitted for rectal bleeding that started at 5 AM this morning. She complains of mild epigastric pain. Patient has porta cath and PEG tube. She is on Osmolite tube feeds. She complains of mild tenderness around the entrance of her PEG tube. No reported fever.    This is her first episode of rectal bleeding since her diagnosis. The patient has previously been seen by Dr. Benson Norway. Gastroenterology has been called and made aware. Baseline hemoglobin is around 10.3. Today her hemoglobin is 7.2. She is mildly tachycardic but hemodynamically stable.       Review of Systems: negative for the following  Constitutional: Denies fever, chills, diaphoresis, appetite change and fatigue.  HEENT: Denies photophobia, eye pain, redness, hearing loss, ear pain, congestion, sore throat, rhinorrhea, sneezing, mouth sores, trouble swallowing, neck pain, neck stiffness and tinnitus.  Respiratory: Denies SOB, DOE, cough, chest tightness, and wheezing.  Cardiovascular: Denies chest pain, palpitations and leg swelling.  Gastrointestinal: Denies nausea, vomiting, abdominal pain, diarrhea, constipation, positive for blood in stool and abdominal distention.  Genitourinary: Denies dysuria, urgency, frequency, hematuria, flank pain and difficulty urinating.  Musculoskeletal:  Denies myalgias, back pain, joint swelling, arthralgias and gait problem.  Skin: Denies pallor, rash and wound.  Neurological: Denies dizziness, seizures, syncope, weakness, light-headedness, numbness and headaches.  Hematological: Denies adenopathy. Easy bruising, personal or family bleeding history  Psychiatric/Behavioral: Denies suicidal ideation, mood changes, confusion, nervousness, sleep disturbance and agitation       Past Medical History  Diagnosis Date  . Multiple sclerosis   . Irritable bowel syndrome   . Arthritis   . Pancreatitis   . Anxiety   . Depression   . Chronic pain   . Migraine   . PTSD (post-traumatic stress disorder)   . Methadone use   . PONV (postoperative nausea and vomiting)   . 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 Grave's disease   . hypothyroidism   . Neuromuscular disorder   . Substance abuse      Past Surgical History  Procedure Laterality Date  . Sphincterotomy    . Cholecystectomy    . Eus N/A 08/08/2013    Procedure: ESOPHAGEAL ENDOSCOPIC ULTRASOUND (EUS) RADIAL;  Surgeon: Beryle Beams, MD;  Location: [facility];  Service: Endoscopy;  Laterality: N/A;  . 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 Hernia repair    . Gastrostomy N/A 09/17/2013    Procedure: FEEDING JEJUNOSTOMY TUBE PLACEMENT;  Surgeon: Adin Hector, MD;  Location: [place];  Service: General;  Laterality: N/A;      Social History:  reports that she quit smoking about 7 weeks ago. She has never used smokeless tobacco. She reports that she does not drink alcohol or use illicit drugs.    Allergies  Allergen Reactions  . Fentanyl   . Erythromycin Nausea And  Vomiting  . Morphine And Related Other (See Comments)    whelps on arms at iv site    Family History  Problem Relation Age of Onset  . Cancer Mother     ovarian, colon, breast  . Hypertension Father   . Cancer Maternal  Uncle     lung  . Cancer Maternal Grandmother     lung     Prior to Admission medications   Medication Sig Start Date End Date Taking? Authorizing Provider  cephALEXin (KEFLEX) 250 MG/5ML suspension Place 10 mLs (500 mg total) into feeding tube 3 (three) times daily. X 7 DAYS. 10/29/13  Yes Lennis Marion Downer, MD  diphenhydrAMINE (BENADRYL) 12.5 MG/5ML elixir Place 25 mg into feeding tube 4 (four) times daily as needed.   Yes Historical Provider, MD  esomeprazole (NEXIUM) 40 MG packet 40 mg by Jejunal Tube route daily before breakfast. 10/03/13  Yes Historical Provider, MD  guaiFENesin (ROBITUSSIN) 100 MG/5ML SOLN Take 10 mLs (200 mg total) by mouth every 4 (four) hours as needed for cough or to loosen phlegm. 11/05/13  Yes Lennis Marion Downer, MD  hydrocortisone cream 1 % Apply 1 application topically 2 (two) times daily. Apply to affected areas on arms for 5 to 7 days. 10/29/13  Yes Lennis Marion Downer, MD  levothyroxine (SYNTHROID, LEVOTHROID) 100 MCG tablet Give 100 mcg by tube daily before breakfast.    Yes Historical Provider, MD  lidocaine-prilocaine (EMLA) cream Apply 1-2 hours prior to Porta-Cath accessed 09/03/13  Yes Lennis P Livesay, MD  Melatonin 5 MG TABS Give 5 mg by tube at bedtime.    Yes Historical Provider, MD  methadone (DOLOPHINE) 5 MG/5ML solution Place 30 mLs (30 mg total) into feeding tube every 8 (eight) hours. 10/27/13  Yes Lennis Marion Downer, MD  naproxen (NAPROSYN) 125 MG/5ML suspension 125mg /5 mls twice a day with tube feedings 11/03/13  Yes Lennis P Livesay, MD  nicotine (NICODERM CQ - DOSED IN MG/24 HOURS) 14 mg/24hr patch Place 1 patch (14 mg total) onto the skin daily. 10/08/13  Yes Lennis Marion Downer, MD  nystatin (MYCOSTATIN) 100000 UNIT/ML suspension  10/08/13  Yes Historical Provider, MD  ondansetron (ZOFRAN ODT) 8 MG disintegrating tablet Take 1 tablet (8 mg total) by mouth every 8 (eight) hours as needed for nausea or vomiting. 10/29/13  Yes Lennis Marion Downer, MD   oxyCODONE (ROXICODONE) 5 MG/5ML solution Place 5-10 mg into feeding tube every 4 (four) hours as needed for moderate pain. 10-15 mg every 4 hours into feeding tube as needed for pain 11/03/13  Yes Lennis Marion Downer, MD  potassium chloride 20 MEQ/15ML (10%) solution Place 15 mLs (20 mEq total) into feeding tube 2 (two) times daily. 09/26/13  Yes Lennis Marion Downer, MD  promethazine (PHENERGAN) 6.25 MG/5ML syrup Take 10 mLs (12.5 mg total) by mouth every 6 (six) hours as needed for nausea or vomiting. 11/05/13  Yes Lennis P Livesay, MD  sucralfate (CARAFATE) 1 GM/10ML suspension Take 10 mLs (1 g total) by mouth 4 (four) times daily -  with meals and at bedtime. 10/20/13  Yes Gordy Levan, MD     Physical Exam: Filed Vitals:   11/10/2013 1745 11/28/2013 1800 11/27/2013 1815 11/11/2013 1830  BP: 114/65 120/64 113/66 124/69  Pulse: 84 87 88 87  Temp:  97.5 F (36.4 C)    TempSrc:  Oral    Resp:      Height:      Weight:      SpO2:  100% 100% 100% 99%     Constitutional: Vital signs reviewed. Patient is a well-developed and well-nourished in no acute distress and cooperative with exam. Alert and oriented x3.  Head: Normocephalic and atraumatic  Ear: TM normal bilaterally  Mouth: no erythema or exudates, MMM  Eyes: PERRL, EOMI, conjunctivae normal, No scleral icterus.  Neck: Supple, Trachea midline normal ROM, No JVD, mass, thyromegaly, or carotid bruit present.  Cardiovascular: RRR, S1 normal, S2 normal, no MRG, pulses symmetric and intact bilaterally  Pulmonary/Chest: CTAB, no wheezes, rales, or rhonchi  Abdominal: Soft. Non-tender, non-distended, bowel sounds are normal, no masses, organomegaly, or guarding present. PEG tube in place without surrounding erythema  GU: no CVA tenderness Musculoskeletal: No joint deformities, erythema, or stiffness, ROM full and no nontender Ext: no edema and no cyanosis, pulses palpable bilaterally (DP and PT)  Hematology: no cervical, inginal, or axillary  adenopathy.  Neurological: A&O x3, Strenght is normal and symmetric bilaterally, cranial nerve II-XII are grossly intact, no focal motor deficit, sensory intact to light touch bilaterally.  Skin: Warm, dry and intact. No rash, cyanosis, or clubbing.  Psychiatric: Normal mood and affect. speech and behavior is normal. Judgment and thought content normal. Cognition and memory are normal.       Labs on Admission:    Basic Metabolic Panel:  Recent Labs Lab 11/03/13 1119 11-15-2013 1604  NA 134* 134*  K 4.1 3.8  CL  --  97  CO2 27 26  GLUCOSE 83 94  BUN 13.7 24*  CREATININE 0.4* 0.35*  CALCIUM 8.2* 7.1*   Liver Function Tests:  Recent Labs Lab 11/03/13 1119 15-Nov-2013 1604  AST 29 43*  ALT 24 20  ALKPHOS 240* 169*  BILITOT 0.22 <0.2*  PROT 5.7* 5.5*  ALBUMIN 1.8* 1.8*    Recent Labs Lab 11/15/2013 1605  LIPASE 39   No results found for this basename: AMMONIA,  in the last 168 hours CBC:  Recent Labs Lab 11/03/13 1118 11/15/13 1604  WBC 5.5 5.0  NEUTROABS 4.0 4.2  HGB 10.3* 7.2*  HCT 32.5* 21.9*  MCV 95.0 92.4  PLT 594* PLATELET CLUMPS NOTED ON SMEAR, COUNT APPEARS ADEQUATE   Cardiac Enzymes: No results found for this basename: CKTOTAL, CKMB, CKMBINDEX, TROPONINI,  in the last 168 hours  BNP (last 3 results)  Recent Labs  07/29/13 2104  PROBNP 74.9      CBG: No results found for this basename: GLUCAP,  in the last 168 hours  Radiological Exams on Admission: No results found.  EKG: Independently reviewed.     Assessment/Plan Active Problems:   GI bleed   Rectal bleeding likely secondary to distal esophageal bleeding Gastroenterology consulted,dr hung Receiving 2 units of packed red blood cells Patient currently n.p.o. 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

## 2013-11-07 ENCOUNTER — Ambulatory Visit: Payer: Medicare Other

## 2013-11-07 ENCOUNTER — Inpatient Hospital Stay (HOSPITAL_COMMUNITY): Payer: Medicare Other

## 2013-11-07 DIAGNOSIS — I4581 Long QT syndrome: Secondary | ICD-10-CM

## 2013-11-07 DIAGNOSIS — J90 Pleural effusion, not elsewhere classified: Secondary | ICD-10-CM | POA: Diagnosis present

## 2013-11-07 DIAGNOSIS — E43 Unspecified severe protein-calorie malnutrition: Secondary | ICD-10-CM

## 2013-11-07 DIAGNOSIS — L03039 Cellulitis of unspecified toe: Secondary | ICD-10-CM | POA: Diagnosis present

## 2013-11-07 DIAGNOSIS — G35 Multiple sclerosis: Secondary | ICD-10-CM

## 2013-11-07 DIAGNOSIS — F1120 Opioid dependence, uncomplicated: Secondary | ICD-10-CM

## 2013-11-07 DIAGNOSIS — K861 Other chronic pancreatitis: Secondary | ICD-10-CM

## 2013-11-07 DIAGNOSIS — L02619 Cutaneous abscess of unspecified foot: Secondary | ICD-10-CM

## 2013-11-07 DIAGNOSIS — K869 Disease of pancreas, unspecified: Secondary | ICD-10-CM

## 2013-11-07 DIAGNOSIS — C159 Malignant neoplasm of esophagus, unspecified: Secondary | ICD-10-CM

## 2013-11-07 DIAGNOSIS — F17200 Nicotine dependence, unspecified, uncomplicated: Secondary | ICD-10-CM | POA: Diagnosis present

## 2013-11-07 LAB — BODY FLUID CELL COUNT WITH DIFFERENTIAL
Eos, Fluid: 2 %
Lymphs, Fluid: 55 %
Monocyte-Macrophage-Serous Fluid: 11 % — ABNORMAL LOW (ref 50–90)
Neutrophil Count, Fluid: 32 % — ABNORMAL HIGH (ref 0–25)
WBC FLUID: 202 uL (ref 0–1000)

## 2013-11-07 LAB — COMPREHENSIVE METABOLIC PANEL WITH GFR
ALT: 19 U/L (ref 0–35)
AST: 45 U/L — ABNORMAL HIGH (ref 0–37)
Albumin: 1.8 g/dL — ABNORMAL LOW (ref 3.5–5.2)
Alkaline Phosphatase: 154 U/L — ABNORMAL HIGH (ref 39–117)
BUN: 14 mg/dL (ref 6–23)
CO2: 24 meq/L (ref 19–32)
Calcium: 6.8 mg/dL — ABNORMAL LOW (ref 8.4–10.5)
Chloride: 98 meq/L (ref 96–112)
Creatinine, Ser: 0.29 mg/dL — ABNORMAL LOW (ref 0.50–1.10)
GFR calc Af Amer: >90 mL/min
GFR calc non Af Amer: >90 mL/min
Glucose, Bld: 65 mg/dL — ABNORMAL LOW (ref 70–99)
Potassium: 3.4 meq/L — ABNORMAL LOW (ref 3.7–5.3)
Sodium: 136 meq/L — ABNORMAL LOW (ref 137–147)
Total Bilirubin: 0.4 mg/dL (ref 0.3–1.2)
Total Protein: 5.5 g/dL — ABNORMAL LOW (ref 6.0–8.3)

## 2013-11-07 LAB — AMYLASE, BODY FLUID: Amylase, Fluid: 29 U/L

## 2013-11-07 LAB — TYPE AND SCREEN
ABO/RH(D): B POS
ANTIBODY SCREEN: NEGATIVE
UNIT DIVISION: 0
Unit division: 0

## 2013-11-07 LAB — CBC
HCT: 34.3 % — ABNORMAL LOW (ref 36.0–46.0)
HEMOGLOBIN: 11.7 g/dL — AB (ref 12.0–15.0)
MCH: 30.8 pg (ref 26.0–34.0)
MCHC: 34.1 g/dL (ref 30.0–36.0)
MCV: 90.3 fL (ref 78.0–100.0)
Platelets: 334 10*3/uL (ref 150–400)
RBC: 3.8 MIL/uL — ABNORMAL LOW (ref 3.87–5.11)
RDW: 16.4 % — ABNORMAL HIGH (ref 11.5–15.5)
WBC: 9 10*3/uL (ref 4.0–10.5)

## 2013-11-07 LAB — CBC WITH DIFFERENTIAL/PLATELET
Basophils Absolute: 0 10*3/uL (ref 0.0–0.1)
Basophils Relative: 0 % (ref 0–1)
Eosinophils Absolute: 0 10*3/uL (ref 0.0–0.7)
Eosinophils Relative: 0 % (ref 0–5)
HCT: 33.7 % — ABNORMAL LOW (ref 36.0–46.0)
Hemoglobin: 11.4 g/dL — ABNORMAL LOW (ref 12.0–15.0)
Lymphocytes Relative: 4 % — ABNORMAL LOW (ref 12–46)
Lymphs Abs: 0.4 10*3/uL — ABNORMAL LOW (ref 0.7–4.0)
MCH: 30.4 pg (ref 26.0–34.0)
MCHC: 33.8 g/dL (ref 30.0–36.0)
MCV: 89.9 fL (ref 78.0–100.0)
Monocytes Absolute: 0.2 10*3/uL (ref 0.1–1.0)
Monocytes Relative: 2 % — ABNORMAL LOW (ref 3–12)
Neutro Abs: 9.5 10*3/uL — ABNORMAL HIGH (ref 1.7–7.7)
Neutrophils Relative %: 94 % — ABNORMAL HIGH (ref 43–77)
Platelets: 377 10*3/uL (ref 150–400)
RBC: 3.75 MIL/uL — ABNORMAL LOW (ref 3.87–5.11)
RDW: 16.6 % — ABNORMAL HIGH (ref 11.5–15.5)
WBC: 10.1 10*3/uL (ref 4.0–10.5)

## 2013-11-07 LAB — TROPONIN I

## 2013-11-07 LAB — LACTATE DEHYDROGENASE, PLEURAL OR PERITONEAL FLUID: LD, Fluid: 194 U/L — ABNORMAL HIGH (ref 3–23)

## 2013-11-07 LAB — PROTEIN, BODY FLUID: Total protein, fluid: 2.7 g/dL

## 2013-11-07 LAB — MAGNESIUM: Magnesium: 2.1 mg/dL (ref 1.5–2.5)

## 2013-11-07 LAB — TSH: TSH: 22.058 u[IU]/mL — ABNORMAL HIGH (ref 0.350–4.500)

## 2013-11-07 MED ORDER — ALTEPLASE 100 MG IV SOLR
2.0000 mg | Freq: Once | INTRAVENOUS | Status: AC
  Administered 2013-11-07: 2 mg
  Filled 2013-11-07: qty 2

## 2013-11-07 MED ORDER — HYDROMORPHONE HCL PF 1 MG/ML IJ SOLN
1.0000 mg | INTRAMUSCULAR | Status: DC | PRN
  Administered 2013-11-07: 2 mg via INTRAVENOUS
  Administered 2013-11-07: 1 mg via INTRAVENOUS
  Administered 2013-11-07 (×2): 2 mg via INTRAVENOUS
  Administered 2013-11-07: 1 mg via INTRAVENOUS
  Administered 2013-11-07 – 2013-11-08 (×4): 2 mg via INTRAVENOUS
  Administered 2013-11-08: 1 mg via INTRAVENOUS
  Administered 2013-11-08: 2 mg via INTRAVENOUS
  Administered 2013-11-09 (×2): 1 mg via INTRAVENOUS
  Administered 2013-11-09: 2 mg via INTRAVENOUS
  Filled 2013-11-07 (×2): qty 2
  Filled 2013-11-07: qty 1
  Filled 2013-11-07 (×4): qty 2
  Filled 2013-11-07: qty 1
  Filled 2013-11-07 (×2): qty 2
  Filled 2013-11-07: qty 1
  Filled 2013-11-07 (×2): qty 2

## 2013-11-07 MED ORDER — KCL IN DEXTROSE-NACL 40-5-0.9 MEQ/L-%-% IV SOLN
INTRAVENOUS | Status: DC
  Administered 2013-11-07: 19:00:00 via INTRAVENOUS
  Filled 2013-11-07 (×3): qty 1000

## 2013-11-07 MED ORDER — NICOTINE 14 MG/24HR TD PT24
14.0000 mg | MEDICATED_PATCH | Freq: Every day | TRANSDERMAL | Status: DC
  Administered 2013-11-07 – 2013-11-09 (×3): 14 mg via TRANSDERMAL
  Filled 2013-11-07 (×4): qty 1

## 2013-11-07 MED ORDER — ONDANSETRON 8 MG/NS 50 ML IVPB
8.0000 mg | Freq: Three times a day (TID) | INTRAVENOUS | Status: DC | PRN
  Administered 2013-11-08: 8 mg via INTRAVENOUS
  Filled 2013-11-07 (×2): qty 8

## 2013-11-07 MED ORDER — IOHEXOL 300 MG/ML  SOLN
25.0000 mL | INTRAMUSCULAR | Status: AC
  Administered 2013-11-07 (×2): 25 mL via ORAL

## 2013-11-07 MED ORDER — IOHEXOL 300 MG/ML  SOLN
100.0000 mL | Freq: Once | INTRAMUSCULAR | Status: AC | PRN
  Administered 2013-11-07: 100 mL via INTRAVENOUS

## 2013-11-07 MED ORDER — VANCOMYCIN HCL 10 G IV SOLR
1500.0000 mg | INTRAVENOUS | Status: DC
  Administered 2013-11-07 – 2013-11-08 (×2): 1500 mg via INTRAVENOUS
  Filled 2013-11-07 (×3): qty 1500

## 2013-11-07 MED ORDER — LEVALBUTEROL HCL 0.63 MG/3ML IN NEBU
0.6300 mg | INHALATION_SOLUTION | Freq: Four times a day (QID) | RESPIRATORY_TRACT | Status: DC | PRN
  Filled 2013-11-07: qty 3

## 2013-11-07 MED ORDER — PANTOPRAZOLE SODIUM 40 MG IV SOLR
40.0000 mg | Freq: Two times a day (BID) | INTRAVENOUS | Status: DC
  Administered 2013-11-07: 40 mg via INTRAVENOUS
  Filled 2013-11-07 (×3): qty 40

## 2013-11-07 MED ORDER — PANTOPRAZOLE SODIUM 40 MG IV SOLR
40.0000 mg | Freq: Two times a day (BID) | INTRAVENOUS | Status: DC
  Filled 2013-11-07: qty 40

## 2013-11-07 MED ORDER — ALTEPLASE 100 MG IV SOLR
2.0000 mg | Freq: Once | INTRAVENOUS | Status: AC
Start: 2013-11-07 — End: 2013-11-07
  Administered 2013-11-07: 2 mg
  Filled 2013-11-07: qty 2

## 2013-11-07 MED ORDER — HYDROMORPHONE HCL PF 1 MG/ML IJ SOLN
INTRAMUSCULAR | Status: AC
  Filled 2013-11-07: qty 1

## 2013-11-07 NOTE — Progress Notes (Signed)
Medical Oncology  Appreciate notification by hospitalist service of admission 12/01/2013 for rectal bleeding. This MD spoke with unit RN 0730 today, to be sure patient would not be off unit if I came for visit now, and was told that she would be available. However, she is off unit for CT. EMR reviewed, including improvement in Hgb post 2 units PRBCs on admission, with WBC and plt good (#2 FOLFOX given outpatient this week in palliative attempt for her metastatic distal esophageal/ GE junction adenocarcinoma). DNR status noted in EMR as patient has requested. I will try to see her later today, possibly after office hours this evening. Please call prior if I can assist otherwise  Pager [phone] or office [phone].  Godfrey Pick, MD

## 2013-11-07 NOTE — Progress Notes (Signed)
Medical Oncology  CT report and images reviewed from PACs, new large left pleural effusion. Spoke with RN on unit, who tells me that patient was very SOB lying flat for CT but otherwise stable breathing on O2; IV via PAC protonix drip at ~ 20 cc/hr. ED notes seen that right sided  PAC had no blood return at admission, tho [place] notes all chart blood return present. Spoke with Dr Conley Canal, who will also be rounding on patient for hospitalist service. Recommended having IV therapy check PAC now for blood return and left thoracentesis by IR for counts, chemistries, cytology if stable for this from standpoint of GI bleed. Dr Conley Canal aware that I will also see patient later today. Godfrey Pick, MD

## 2013-11-07 NOTE — Progress Notes (Signed)
11/07/2013, [facility] day 2 Antibiotics: none Chemotherapy: cycle 2 palliative FOLFOX 12-29 thru 11-05-13   Appreciate notification of admission by hospitalist service 11/19/2013 for rectal bleeding, that problem clinically stabilized today and Hgb up well with 2 units PRBCs since admission. CT ordered on admission and done today showed new large left pleural effusion, tapped by IR for 1.4 liters yellow fluid, lab studies including cytology pending.   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 She has been treated with RT to area of primary and T9-11 by Dr Isidore Moos, completed 09-05-13. She has jejunostomy tube (Dr Dalbert Batman) with goal rate Osmolite 1.2 of 60 cc/hr continuous + 30 cc free water flushes q hour PTA; she had not tolerated tube feedings for at least 24 hrs prior to this admission, due to increase in nausea. Patient is well aware of poor prognosis, but wanted to try chemotherapy in palliative attempt and was improved enough to try FOLFOX in palliative attempt beginning 10-23-13 and cycle 2 FOLFOX 12-29 thru 11-05-13, with dose reductions both times. She also had zometa on 11-03-13.  [facility] has assisted with equipment at home. Code status is DNR. She has PAC in, which had blood return both 12-29 and 12-31 per [place] notes.   Situation is complicated by history of chronic pancreatitis, multiple sclerosis, some history of drug abuse such that she has been on methadone 30 mg q 8 hrs x3 years prior to diagnosis of the esophageal cancer. She is single parent of 4 children ages 20-20, 25 of whom live with patient including one who is autistic; patient has mostly been staying with her mother since this illness.   She is Hospice appropriate other than fact that we have been attempting chemotherapy, however I expect we will likely need to change to strict comfort  interventions from here, in which case Hospice will probably be of significant benefit.     Subjective: Patient seen, no family here presently. I spoke by phone with Dr Conley Canal after CT results available earlier today, and with RN on unit now.  Breathing has improved and less cough since thoracentesis. She has low grade continuous nausea "tolerable", has not vomited. Feet have still had burning discomfort (venous dopplers negative 10-20-13, minimal improvement with Keflex in past 2 weeks). She states pain better with present dilaudid but not completely controlled. She does not want to try back onto tube feedings before possibly tomorrow.   Objective: Vital signs in last 24 hours: Blood pressure 118/69, pulse 95, temperature 97.8 F (36.6 C), temperature source Oral, resp. rate 17, height 5\' 3"  (1.6 m), weight 117 lb 11.6 oz (53.4 kg), last menstrual period 04/28/2011, SpO2 98.00%.   Intake/Output from previous day: 01/01 0701 - 01/02 0700 In: 325 [Blood:325] Out: -  Intake/Output this shift: Total I/O In: 330.8 [I.V.:330.8] Out: 500 [Urine:500]  Physical exam: She rouses easily to voice, looks somewhat uncomfortable but not in acute distress, cachectic, chronically ill, pale,  lying supine in bed on RA. Alert, oriented, appropriate and cooperative. Pupils round and reactive. Oral mucosa dry without lesions. PAC site ok. Heart tachy, regular, clear heart sounds. Lungs with improved breath sounds lower left chest, few scattered crackles, no wheezes or rales. No JVD supine. Abdomen not distended, soft, quiet, J tube dressed. LE 1 + edema, minimal erythema distal feet, much improved. Dry skin. Moves all extremities in bed.  Lab Results:  Recent Labs  11/07/13 0553 11/07/13 0735  WBC 9.0 10.1  HGB 11.7* 11.4*  HCT 34.3* 33.7*  PLT 334 377   Hgb 7.2 on admission, down from ~ 10 previously. BMET  Recent Labs  11/16/2013 1604 11/07/13 0553  NA 134* 136*  K 3.8 3.4*  CL 97 98   CO2 26 24  GLUCOSE 94 65*  BUN 24* 14  CREATININE 0.35* 0.29*  CALCIUM 7.1* 6.8*    Studies/Results: Dg Chest 1 View  11/07/2013   CLINICAL DATA:  Status post left thoracentesis  EXAM: CHEST - 1 VIEW  COMPARISON:  CT from earlier in the same day.  FINDINGS: Cardiac shadow is within normal limits. There is been significant reduction in left-sided pleural effusion with with significant expansion of the left lung. No pneumothorax is noted. Infiltrative changes are again noted in the right upper lobe and left base. Right-sided chest wall port is again seen.  IMPRESSION: No evidence of pneumothorax following thoracentesis.   Electronically Signed   By: Inez Catalina M.D.   On: 11/07/2013 14:39   Ct Chest W Contrast  11/07/2013   CLINICAL DATA:  Esophageal cancer, question esophageal ulceration  EXAM: CT CHEST, ABDOMEN, AND PELVIS WITH CONTRAST  TECHNIQUE: Multidetector CT imaging of the chest, abdomen and pelvis was performed following the standard protocol during bolus administration of intravenous contrast.  CONTRAST:  169mL OMNIPAQUE IOHEXOL 300 MG/ML  SOLN  COMPARISON:  DG RIBS UNILATERAL W/CHEST*L* dated 10/02/2013; NM PET IMAGE INITIAL (PI) SKULL BASE TO THIGH dated 08/19/2013; CT CHEST W/CM dated 07/31/2013  FINDINGS: CT CHEST FINDINGS  There is a port in the right anterior chest wall. No axillary lymphadenopathy. Enlarged left supraclavicular lymph node is again noted measuring 14 x 27 mm.  There is a new large left pleural effusion occupying approximately 80% of the left hemi thorax volume. There is complete atelectasis of the left lower lobe and atelectasis of the lingula. Small right effusion is present. There is patchy airspace disease in the right upper lobe. This is new from prior. Esophageal mass is not demonstrated. Right hilar lymph node measuring 13 mm noted. No pericardial fluid.  CT ABDOMEN AND PELVIS FINDINGS  There is a percutaneous jejunostomy tube in place. Moderate volume of free fluid  in the abdomen and pelvis. Status postcholecystectomy. Liver has a fine nodular appearance. There is some narrowing of the portal veins. There is evidence of portal hypertension with a perirectal venous collaterals. There is a low-density lesion in the tail the pancreas measuring 2.8 x 1.7 cm which is not on comparison CT (image 58, series 2). Spleen, kidneys are normal. The adrenal glands appear enlarged compared to 07/11/2013.  There is a fluid collection  Stomach on the small bowel and colon are unremarkable there is no evidence bowel obstruction. There is a fluid collection adjacent the 2nd portion the duodenum medially measuring 3.3 x 2.3 cm (image 60 of series 2).  Bladder is distended. Uterus is grossly normal. No pelvic lymphadenopathy. Anasarca sarcoma soft tissues. Skeletal metastasis demonstrated on comparison PET-CT scan are not well demonstrated.  IMPRESSION: 1. Dominant finding is large left pleural effusion occupying 80% of the left hemi thorax volume with associated atelectasis. 2. Patchy airspace disease in the right upper lobe represents pulmonary pneumonitis or infection. 3. New low-density lesion the tail of the pancreas with differential including focal pancreatitis versus a pancreatic metastasis. 4. Enlarged adrenal glands, increased compared to prior could also represent metastasis. 5. Increase in  intraperitoneal free fluid and ansarca of the soft tissues. This in part could relate to portal hypertension as there are perirectal venous collaterals. 6. Jejunostomy tube in place without evidence of complication. 7. No evidence of bowel obstruction. 8. Fluid collection adjacent to 2nd portion of the duodenum could relate pancreatitis or duodenitis. Cannot exclude early abscess. 9. Skeletal metastasis not well demonstrated.   Electronically Signed   By: Suzy Bouchard M.D.   On: 11/07/2013 08:47   Ct Abdomen Pelvis W Contrast  11/07/2013   CLINICAL DATA:  Esophageal cancer, question esophageal  ulceration  EXAM: CT CHEST, ABDOMEN, AND PELVIS WITH CONTRAST  TECHNIQUE: Multidetector CT imaging of the chest, abdomen and pelvis was performed following the standard protocol during bolus administration of intravenous contrast.  CONTRAST:  163mL OMNIPAQUE IOHEXOL 300 MG/ML  SOLN  COMPARISON:  DG RIBS UNILATERAL W/CHEST*L* dated 10/02/2013; NM PET IMAGE INITIAL (PI) SKULL BASE TO THIGH dated 08/19/2013; CT CHEST W/CM dated 07/31/2013  FINDINGS: CT CHEST FINDINGS  There is a port in the right anterior chest wall. No axillary lymphadenopathy. Enlarged left supraclavicular lymph node is again noted measuring 14 x 27 mm.  There is a new large left pleural effusion occupying approximately 80% of the left hemi thorax volume. There is complete atelectasis of the left lower lobe and atelectasis of the lingula. Small right effusion is present. There is patchy airspace disease in the right upper lobe. This is new from prior. Esophageal mass is not demonstrated. Right hilar lymph node measuring 13 mm noted. No pericardial fluid.  CT ABDOMEN AND PELVIS FINDINGS  There is a percutaneous jejunostomy tube in place. Moderate volume of free fluid in the abdomen and pelvis. Status postcholecystectomy. Liver has a fine nodular appearance. There is some narrowing of the portal veins. There is evidence of portal hypertension with a perirectal venous collaterals. There is a low-density lesion in the tail the pancreas measuring 2.8 x 1.7 cm which is not on comparison CT (image 58, series 2). Spleen, kidneys are normal. The adrenal glands appear enlarged compared to 07/11/2013.  There is a fluid collection  Stomach on the small bowel and colon are unremarkable there is no evidence bowel obstruction. There is a fluid collection adjacent the 2nd portion the duodenum medially measuring 3.3 x 2.3 cm (image 60 of series 2).  Bladder is distended. Uterus is grossly normal. No pelvic lymphadenopathy. Anasarca sarcoma soft tissues. Skeletal  metastasis demonstrated on comparison PET-CT scan are not well demonstrated.  IMPRESSION: 1. Dominant finding is large left pleural effusion occupying 80% of the left hemi thorax volume with associated atelectasis. 2. Patchy airspace disease in the right upper lobe represents pulmonary pneumonitis or infection. 3. New low-density lesion the tail of the pancreas with differential including focal pancreatitis versus a pancreatic metastasis. 4. Enlarged adrenal glands, increased compared to prior could also represent metastasis. 5. Increase in intraperitoneal free fluid and ansarca of the soft tissues. This in part could relate to portal hypertension as there are perirectal venous collaterals. 6. Jejunostomy tube in place without evidence of complication. 7. No evidence of bowel obstruction. 8. Fluid collection adjacent to 2nd portion of the duodenum could relate pancreatitis or duodenitis. Cannot exclude early abscess. 9. Skeletal metastasis not well demonstrated.   Electronically Signed   By: Suzy Bouchard M.D.   On: 11/07/2013 08:47   US Thoracentesis Asp Pleural Space W/img Guide  11/07/2013   CLINICAL DATA:  Left pleural effusion; GI bleed  EXAM: ULTRASOUND GUIDED left THORACENTESIS  COMPARISON:  None  FINDINGS: A total of approximately 1.4 L of yellow fluid was removed. A fluid sample wassent for laboratory analysis.  IMPRESSION: Successful ultrasound guided left thoracentesis yielding 1.4 L of pleural fluid.  Read by: Jannifer Franklin PA-C  PROCEDURE: An ultrasound guided thoracentesis was thoroughly discussed with the patient and questions answered. The benefits, risks, alternatives and complications were also discussed. The patient understands and wishes to proceed with the procedure. Written consent was obtained.  Ultrasound was performed to localize and mark an adequate pocket of fluid in the left chest. The area was then prepped and draped in the normal sterile fashion. 1% Lidocaine was used for local  anesthesia. Under ultrasound guidance a 19 gauge Yueh catheter was introduced. Thoracentesis was performed. The catheter was removed and a dressing applied.  Complications:  None   Electronically Signed   By: Markus Daft M.D.   On: 11/07/2013 15:52     Assessment/Plan: 1. Metastatic adenocarcinoma of distal esophagus/ GE junction, locally advanced and involving bone: new large left pleural effusion in addition to very symptomatic disease otherwise. I doubt further chemotherapy will be appropriate or possible if cytology of the pleural fluid is positive or if situation does not improve over next few days, in which case I will talk with her about Hospice care from here. Agree with Dr Ulyses Amor recommendation not to do any invasive evaluation for the recent rectal bleeding. I would suggest continuing her usual methadone via J tube and continuing prn dilaudid now. Code status DNR established. 2.Severe protein calorie malnutrition: suggest trying to resume jejunostomy tube feedings on 11-08-13. Goal rate was 60 cc/ hr, tho she probably needs to resume at ~ 30 cc/ hr initially. 3.chronic pancreatitis since cholecystectomy 1993, better in past 2 years post sphincterotomy and stents 4.PAC in, not drawing blood now. I have discussed with IV therapy after seeing patient and they will try TPA 5.hypothyroid post treatment for Graves disease 6.multiple sclerosis history 7.long QT syndrome: already at maximum methadone 8.long tobacco recently DCd 9.difficult social situation with children 10. Flu vaccine done 11.recent burning discomfort and erythema of distal feet, better now at my exam. Etiology not clear, venous dopplers negative, never febrile, Keflex not really helpful. Follow   I will see her again this weekend. Please call our on call physician if needed between my rounds.  Laisha Rau P [phone]

## 2013-11-07 NOTE — Progress Notes (Signed)
ANTIBIOTIC CONSULT NOTE - INITIAL  Pharmacy Consult for Vancomycin Indication: cellulitis  Allergies  Allergen Reactions  . Fentanyl   . Erythromycin Nausea And Vomiting  . Morphine And Related Other (See Comments)    whelps on arms at iv site   Patient Measurements: Height: 5\' 3"  (160 cm) Weight: 117 lb 11.6 oz (53.4 kg) IBW/kg (Calculated) : 52.4 Vital Signs: Temp: 98.2 F (36.8 C) (01/02 1553) Temp src: Oral (01/02 1553) BP: 118/69 mmHg (01/02 1553) Pulse Rate: 95 (01/02 1553) Intake/Output from previous day: 01/01 0701 - 01/02 0700 In: 325 [Blood:325] Out: -  Intake/Output from this shift: Total I/O In: 330.8 [I.V.:330.8] Out: 500 [Urine:500] Labs:  Recent Labs  12/03/2013 1604 11/07/13 0553 11/07/13 0735  WBC 5.0 9.0 10.1  HGB 7.2* 11.7* 11.4*  PLT PLATELET CLUMPS NOTED ON SMEAR, COUNT APPEARS ADEQUATE 334 377  CREATININE 0.35* 0.29*  --    Estimated Creatinine Clearance: 72.7 ml/min (by C-G formula based on Cr of 0.29).  Microbiology: No results found for this or any previous visit (from the past 720 hour(s)).  Medical History: Past Medical History  Diagnosis Date  . Multiple sclerosis   . Irritable bowel syndrome   . Arthritis   . Pancreatitis   . Anxiety   . Depression   . Chronic pain   . Migraine   . PTSD (post-traumatic stress disorder)   . Methadone use   . PONV (postoperative nausea and vomiting)   . 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 Grave's disease   . hypothyroidism   . Neuromuscular disorder   . Substance abuse    Assessment: 42 YOF to start vancomycin for cellulitis. WBC is within normal limits at 10.2 however patient as adenocarcinoma of esophagus s/p recent chemotherapy so careful interpretation needed.  Patient remains afebrile. Estimated CrCl ~70-33mL/min. Body fluid culture pending.   Goal of Therapy:  Vancomycin trough level 10-15 mcg/ml  Plan:  1. Vancomycin 1500mg  IV q24h. 2. Vancomycin trough  level at Css.  3. Monitor renal function and adjust dosing as needed.  4. Monitor culture results.   Sloan Leiter, PharmD, BCPS Clinical Pharmacist [phone] 11/07/2013,5:43 PM

## 2013-11-07 NOTE — Progress Notes (Signed)
Pt transferred to 6N05, report called to Virgil, Therapist, sports. Pt belongings sent with pt to new room. [employer] notified of pt transfer, pt resting comfortably at time of transfer

## 2013-11-07 NOTE — Consult Note (Signed)
Reason for Consult: Hematochezia Referring Physician: [facility]  [name] HPI: This is a 48 year old female who is well-known to me.  She has metastatic esophageal cancer and she has been undergoing treatment with Dr. Marko Plume.  Acutely last evening she started to have some mid to lower abdominal pain.  She had a significant bout of hematochezia and she was noted to have a 3 gram drop in her HGB.  She has increased with blood transfusion to 11 g/dL.  She is feeling better and she does not have any abdominal pain.    Past Medical History  Diagnosis Date  . Multiple sclerosis   . Irritable bowel syndrome   . Arthritis   . Pancreatitis   . Anxiety   . Depression   . Chronic pain   . Migraine   . PTSD (post-traumatic stress disorder)   . Methadone use   . PONV (postoperative nausea and vomiting)   . 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 Grave's disease   . hypothyroidism   . Neuromuscular disorder   . Substance abuse     Past Surgical History  Procedure Laterality Date  . Sphincterotomy    . Cholecystectomy    . Eus N/A 08/08/2013    Procedure: ESOPHAGEAL ENDOSCOPIC ULTRASOUND (EUS) RADIAL;  Surgeon: Beryle Beams, MD;  Location: [facility];  Service: Endoscopy;  Laterality: N/A;  . 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 Hernia repair    . Gastrostomy N/A 09/17/2013    Procedure: FEEDING JEJUNOSTOMY TUBE PLACEMENT;  Surgeon: Adin Hector, MD;  Location: [facility];  Service: General;  Laterality: N/A;    Family History  Problem Relation Age of Onset  . Cancer Mother     ovarian, colon, breast  . Hypertension Father   . Cancer Maternal Uncle     lung  . Cancer Maternal Grandmother     lung    Social History:  reports that she quit smoking about 8 weeks ago. She has never used smokeless tobacco. She reports that she does not drink alcohol or use illicit drugs.  Allergies:   Allergies  Allergen Reactions  . Fentanyl   . Erythromycin Nausea And Vomiting  . Morphine And Related Other (See Comments)    whelps on arms at iv site    Medications:  Scheduled: . levothyroxine  100 mcg Per Tube QAC breakfast  . methadone  30 mg Per Tube Q8H  . nicotine  14 mg Transdermal Daily  . pantoprazole (PROTONIX) IV  80 mg Intravenous Once  . sodium chloride  1,000 mL Intravenous Once   Continuous: . pantoprozole (PROTONIX) infusion 8 mg/hr (11/07/13 0600)    Results for orders placed during the hospital encounter of 11/20/2013 (from the past 24 hour(s))  CBC WITH DIFFERENTIAL     Status: Abnormal   Collection Time    11/29/2013  4:04 PM      Result Value Range   WBC 5.0  4.0 - 10.5 K/uL   RBC 2.37 (*) 3.87 - 5.11 MIL/uL   Hemoglobin 7.2 (*) 12.0 - 15.0 g/dL   HCT 21.9 (*) 36.0 - 46.0 %   MCV 92.4  78.0 - 100.0 fL   MCH 30.4  26.0 - 34.0 pg   MCHC 32.9  30.0 - 36.0 g/dL   RDW 15.8 (*) 11.5 - 15.5 %   Platelets    150 -  400 K/uL   Value: PLATELET CLUMPS NOTED ON SMEAR, COUNT APPEARS ADEQUATE   Neutrophils Relative % 85 (*) 43 - 77 %   Lymphocytes Relative 13  12 - 46 %   Monocytes Relative 2 (*) 3 - 12 %   Eosinophils Relative 0  0 - 5 %   Basophils Relative 0  0 - 1 %   Neutro Abs 4.2  1.7 - 7.7 K/uL   Lymphs Abs 0.7  0.7 - 4.0 K/uL   Monocytes Absolute 0.1  0.1 - 1.0 K/uL   Eosinophils Absolute 0.0  0.0 - 0.7 K/uL   Basophils Absolute 0.0  0.0 - 0.1 K/uL   Smear Review FIBRIN STRANDS NOTED    COMPREHENSIVE METABOLIC PANEL     Status: Abnormal   Collection Time    11/16/2013  4:04 PM      Result Value Range   Sodium 134 (*) 137 - 147 mEq/L   Potassium 3.8  3.7 - 5.3 mEq/L   Chloride 97  96 - 112 mEq/L   CO2 26  19 - 32 mEq/L   Glucose, Bld 94  70 - 99 mg/dL   BUN 24 (*) 6 - 23 mg/dL   Creatinine, Ser 0.35 (*) 0.50 - 1.10 mg/dL   Calcium 7.1 (*) 8.4 - 10.5 mg/dL   Total Protein 5.5 (*) 6.0 - 8.3 g/dL   Albumin 1.8 (*) 3.5 - 5.2 g/dL   AST 43 (*) 0 -  37 U/L   ALT 20  0 - 35 U/L   Alkaline Phosphatase 169 (*) 39 - 117 U/L   Total Bilirubin <0.2 (*) 0.3 - 1.2 mg/dL   GFR calc non Af Amer >90  >90 mL/min   GFR calc Af Amer >90  >90 mL/min  LIPASE, BLOOD     Status: None   Collection Time    11/29/2013  4:05 PM      Result Value Range   Lipase 39  11 - 59 U/L  TYPE AND SCREEN     Status: None   Collection Time    11/21/2013  4:15 PM      Result Value Range   ABO/RH(D) B POS     Antibody Screen NEG     Sample Expiration 30-Nov-2013     Unit Number [id number]     Blood Component Type RED CELLS,LR     Unit division 00     Status of Unit ISSUED,FINAL     Transfusion Status OK TO TRANSFUSE     Crossmatch Result Compatible     Unit Number [id number]     Blood Component Type RED CELLS,LR     Unit division 00     Status of Unit ISSUED,FINAL     Transfusion Status OK TO TRANSFUSE     Crossmatch Result Compatible    ABO/RH     Status: None   Collection Time    11/18/2013  4:15 PM      Result Value Range   ABO/RH(D) B POS    URINALYSIS, ROUTINE W REFLEX MICROSCOPIC     Status: Abnormal   Collection Time    12/03/2013  5:08 PM      Result Value Range   Color, Urine YELLOW  YELLOW   APPearance CLOUDY (*) CLEAR   Specific Gravity, Urine 1.034 (*) 1.005 - 1.030   pH 5.5  5.0 - 8.0   Glucose, UA NEGATIVE  NEGATIVE mg/dL   Hgb urine dipstick NEGATIVE  NEGATIVE  Bilirubin Urine NEGATIVE  NEGATIVE   Ketones, ur 15 (*) NEGATIVE mg/dL   Protein, ur NEGATIVE  NEGATIVE mg/dL   Urobilinogen, UA 0.2  0.0 - 1.0 mg/dL   Nitrite NEGATIVE  NEGATIVE   Leukocytes, UA NEGATIVE  NEGATIVE  PREPARE RBC (CROSSMATCH)     Status: None   Collection Time    11/15/2013  5:31 PM      Result Value Range   Order Confirmation ORDER PROCESSED BY BLOOD BANK    OCCULT BLOOD, POC DEVICE     Status: Abnormal   Collection Time    11/17/2013  5:51 PM      Result Value Range   Fecal Occult Bld POSITIVE (*) NEGATIVE  TSH     Status: Abnormal   Collection Time     11/07/13  5:53 AM      Result Value Range   TSH 22.058 (*) 0.350 - 4.500 uIU/mL  TROPONIN I     Status: None   Collection Time    11/07/13  5:53 AM      Result Value Range   Troponin I <0.30  <0.30 ng/mL  CBC     Status: Abnormal   Collection Time    11/07/13  5:53 AM      Result Value Range   WBC 9.0  4.0 - 10.5 K/uL   RBC 3.80 (*) 3.87 - 5.11 MIL/uL   Hemoglobin 11.7 (*) 12.0 - 15.0 g/dL   HCT 34.3 (*) 36.0 - 46.0 %   MCV 90.3  78.0 - 100.0 fL   MCH 30.8  26.0 - 34.0 pg   MCHC 34.1  30.0 - 36.0 g/dL   RDW 16.4 (*) 11.5 - 15.5 %   Platelets 334  150 - 400 K/uL  COMPREHENSIVE METABOLIC PANEL     Status: Abnormal   Collection Time    11/07/13  5:53 AM      Result Value Range   Sodium 136 (*) 137 - 147 mEq/L   Potassium 3.4 (*) 3.7 - 5.3 mEq/L   Chloride 98  96 - 112 mEq/L   CO2 24  19 - 32 mEq/L   Glucose, Bld 65 (*) 70 - 99 mg/dL   BUN 14  6 - 23 mg/dL   Creatinine, Ser 0.29 (*) 0.50 - 1.10 mg/dL   Calcium 6.8 (*) 8.4 - 10.5 mg/dL   Total Protein 5.5 (*) 6.0 - 8.3 g/dL   Albumin 1.8 (*) 3.5 - 5.2 g/dL   AST 45 (*) 0 - 37 U/L   ALT 19  0 - 35 U/L   Alkaline Phosphatase 154 (*) 39 - 117 U/L   Total Bilirubin 0.4  0.3 - 1.2 mg/dL   GFR calc non Af Amer >90  >90 mL/min   GFR calc Af Amer >90  >90 mL/min  MAGNESIUM     Status: None   Collection Time    11/07/13  5:53 AM      Result Value Range   Magnesium 2.1  1.5 - 2.5 mg/dL  TROPONIN I     Status: None   Collection Time    11/07/13  7:35 AM      Result Value Range   Troponin I <0.30  <0.30 ng/mL  CBC WITH DIFFERENTIAL     Status: Abnormal   Collection Time    11/07/13  7:35 AM      Result Value Range   WBC 10.1  4.0 - 10.5 K/uL   RBC 3.75 (*) 3.87 -  5.11 MIL/uL   Hemoglobin 11.4 (*) 12.0 - 15.0 g/dL   HCT 33.7 (*) 36.0 - 46.0 %   MCV 89.9  78.0 - 100.0 fL   MCH 30.4  26.0 - 34.0 pg   MCHC 33.8  30.0 - 36.0 g/dL   RDW 16.6 (*) 11.5 - 15.5 %   Platelets 377  150 - 400 K/uL   Neutrophils Relative % 94 (*) 43  - 77 %   Neutro Abs 9.5 (*) 1.7 - 7.7 K/uL   Lymphocytes Relative 4 (*) 12 - 46 %   Lymphs Abs 0.4 (*) 0.7 - 4.0 K/uL   Monocytes Relative 2 (*) 3 - 12 %   Monocytes Absolute 0.2  0.1 - 1.0 K/uL   Eosinophils Relative 0  0 - 5 %   Eosinophils Absolute 0.0  0.0 - 0.7 K/uL   Basophils Relative 0  0 - 1 %   Basophils Absolute 0.0  0.0 - 0.1 K/uL     Ct Chest W Contrast  11/07/2013   CLINICAL DATA:  Esophageal cancer, question esophageal ulceration  EXAM: CT CHEST, ABDOMEN, AND PELVIS WITH CONTRAST  TECHNIQUE: Multidetector CT imaging of the chest, abdomen and pelvis was performed following the standard protocol during bolus administration of intravenous contrast.  CONTRAST:  150mL OMNIPAQUE IOHEXOL 300 MG/ML  SOLN  COMPARISON:  DG RIBS UNILATERAL W/CHEST*L* dated 10/02/2013; NM PET IMAGE INITIAL (PI) SKULL BASE TO THIGH dated 08/19/2013; CT CHEST W/CM dated 07/31/2013  FINDINGS: CT CHEST FINDINGS  There is a port in the right anterior chest wall. No axillary lymphadenopathy. Enlarged left supraclavicular lymph node is again noted measuring 14 x 27 mm.  There is a new large left pleural effusion occupying approximately 80% of the left hemi thorax volume. There is complete atelectasis of the left lower lobe and atelectasis of the lingula. Small right effusion is present. There is patchy airspace disease in the right upper lobe. This is new from prior. Esophageal mass is not demonstrated. Right hilar lymph node measuring 13 mm noted. No pericardial fluid.  CT ABDOMEN AND PELVIS FINDINGS  There is a percutaneous jejunostomy tube in place. Moderate volume of free fluid in the abdomen and pelvis. Status postcholecystectomy. Liver has a fine nodular appearance. There is some narrowing of the portal veins. There is evidence of portal hypertension with a perirectal venous collaterals. There is a low-density lesion in the tail the pancreas measuring 2.8 x 1.7 cm which is not on comparison CT (image 58, series 2).  Spleen, kidneys are normal. The adrenal glands appear enlarged compared to 07/11/2013.  There is a fluid collection  Stomach on the small bowel and colon are unremarkable there is no evidence bowel obstruction. There is a fluid collection adjacent the 2nd portion the duodenum medially measuring 3.3 x 2.3 cm (image 60 of series 2).  Bladder is distended. Uterus is grossly normal. No pelvic lymphadenopathy. Anasarca sarcoma soft tissues. Skeletal metastasis demonstrated on comparison PET-CT scan are not well demonstrated.  IMPRESSION: 1. Dominant finding is large left pleural effusion occupying 80% of the left hemi thorax volume with associated atelectasis. 2. Patchy airspace disease in the right upper lobe represents pulmonary pneumonitis or infection. 3. New low-density lesion the tail of the pancreas with differential including focal pancreatitis versus a pancreatic metastasis. 4. Enlarged adrenal glands, increased compared to prior could also represent metastasis. 5. Increase in intraperitoneal free fluid and ansarca of the soft tissues. This in part could relate to portal hypertension as  there are perirectal venous collaterals. 6. Jejunostomy tube in place without evidence of complication. 7. No evidence of bowel obstruction. 8. Fluid collection adjacent to 2nd portion of the duodenum could relate pancreatitis or duodenitis. Cannot exclude early abscess. 9. Skeletal metastasis not well demonstrated.   Electronically Signed   By: Suzy Bouchard M.D.   On: 11/07/2013 08:47   Ct Abdomen Pelvis W Contrast  11/07/2013   CLINICAL DATA:  Esophageal cancer, question esophageal ulceration  EXAM: CT CHEST, ABDOMEN, AND PELVIS WITH CONTRAST  TECHNIQUE: Multidetector CT imaging of the chest, abdomen and pelvis was performed following the standard protocol during bolus administration of intravenous contrast.  CONTRAST:  184mL OMNIPAQUE IOHEXOL 300 MG/ML  SOLN  COMPARISON:  DG RIBS UNILATERAL W/CHEST*L* dated 10/02/2013;  NM PET IMAGE INITIAL (PI) SKULL BASE TO THIGH dated 08/19/2013; CT CHEST W/CM dated 07/31/2013  FINDINGS: CT CHEST FINDINGS  There is a port in the right anterior chest wall. No axillary lymphadenopathy. Enlarged left supraclavicular lymph node is again noted measuring 14 x 27 mm.  There is a new large left pleural effusion occupying approximately 80% of the left hemi thorax volume. There is complete atelectasis of the left lower lobe and atelectasis of the lingula. Small right effusion is present. There is patchy airspace disease in the right upper lobe. This is new from prior. Esophageal mass is not demonstrated. Right hilar lymph node measuring 13 mm noted. No pericardial fluid.  CT ABDOMEN AND PELVIS FINDINGS  There is a percutaneous jejunostomy tube in place. Moderate volume of free fluid in the abdomen and pelvis. Status postcholecystectomy. Liver has a fine nodular appearance. There is some narrowing of the portal veins. There is evidence of portal hypertension with a perirectal venous collaterals. There is a low-density lesion in the tail the pancreas measuring 2.8 x 1.7 cm which is not on comparison CT (image 58, series 2). Spleen, kidneys are normal. The adrenal glands appear enlarged compared to 07/11/2013.  There is a fluid collection  Stomach on the small bowel and colon are unremarkable there is no evidence bowel obstruction. There is a fluid collection adjacent the 2nd portion the duodenum medially measuring 3.3 x 2.3 cm (image 60 of series 2).  Bladder is distended. Uterus is grossly normal. No pelvic lymphadenopathy. Anasarca sarcoma soft tissues. Skeletal metastasis demonstrated on comparison PET-CT scan are not well demonstrated.  IMPRESSION: 1. Dominant finding is large left pleural effusion occupying 80% of the left hemi thorax volume with associated atelectasis. 2. Patchy airspace disease in the right upper lobe represents pulmonary pneumonitis or infection. 3. New low-density lesion the tail of  the pancreas with differential including focal pancreatitis versus a pancreatic metastasis. 4. Enlarged adrenal glands, increased compared to prior could also represent metastasis. 5. Increase in intraperitoneal free fluid and ansarca of the soft tissues. This in part could relate to portal hypertension as there are perirectal venous collaterals. 6. Jejunostomy tube in place without evidence of complication. 7. No evidence of bowel obstruction. 8. Fluid collection adjacent to 2nd portion of the duodenum could relate pancreatitis or duodenitis. Cannot exclude early abscess. 9. Skeletal metastasis not well demonstrated.   Electronically Signed   By: Suzy Bouchard M.D.   On: 11/07/2013 08:47    ROS:  As stated above in the HPI otherwise negative.  Blood pressure 117/78, pulse 95, temperature 97.8 F (36.6 C), temperature source Oral, resp. rate 12, height 5\' 3"  (1.6 m), weight 117 lb 11.6 oz (53.4 kg), last menstrual period  04/28/2011, SpO2 99.00%.    PE: Gen: Cachectic and fatigued appearing HEENT:  [place]/AT, EOMI Neck: Supple, no LAD Lungs: CTA Bilaterally CV: RRR without M/G/R ABM: Soft, NTND, +BS Ext: No C/C/E  Assessment/Plan: 1) Metastatic esophageal cancer. 2) Hematochezia. 3) Anemia. 4) ABM pain.   I performed a colonoscopy in 2011 for an abnormal CT scan.  She was identified to have polyps, but no diverticula.  No evidence of any colitis to correlate with the CT scan.  Her clinical status has markedly declined since I last saw her at the time of the EUS.  Her prognosis is poor.  She wants to wait and see if she has any rebleeding as she is feeling better at this time.  The patient has a surgically placed jejunal feeding tube and she reports that she has some tenderness at that site.  If the bleeding is coming from that area I cannot reach the area with the standard endoscopes.  Plan: 1) Follow HGB. 2) Transfuse if necessary.  Jerie Basford D 11/07/2013, 1:57 PM

## 2013-11-07 NOTE — Progress Notes (Signed)
Discussed with Dr. Marko Plume.   [facility] PROGRESS NOTE  [name] NAT:[id number] DOB: 09-08-1966 DOA: 11/08/2013 PCP: [name], MD  Assessment/Plan:   GI bleed appears to have stopped. Transfer to floor. Resume TF tomorrow if stable. Change protonix to bid Active Problems: Pleural effusion s/p pleural effusion. Studies pending.  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 Will give IV vanc   Tobacco use disorder  Code Status:  full Family Communication:   Disposition Plan:  home   HPI/Subjective: No bleeding. C/o pain. No dyspnea.  Objective: Filed Vitals:   11/07/13 1553  BP: 118/69  Pulse: 95  Temp: 98.2 F (36.8 C)  Resp: 17    Intake/Output Summary (Last 24 hours) at 11/07/13 1715 Last data filed at 11/07/13 1600  Gross per 24 hour  Intake 655.83 ml  Output    500 ml  Net 155.83 ml   Filed Weights   11/23/2013 1406 11/10/2013 1954  Weight: 51.256 kg (113 lb) 53.4 kg (117 lb 11.6 oz)    Exam:   General:  Cachectic. Somnolent. Oriented and appropriate  Cardiovascular: RRR without mGR  Respiratory: diminished on left. no WRR  Abdomen: S, NT, ND  Ext: no CCE  Basic Metabolic Panel:  Recent Labs Lab 11/03/13 1119 12/05/2013 1604 11/07/13 0553  NA 134* 134* 136*  K 4.1 3.8 3.4*  CL  --  97 98  CO2 27 26 24   GLUCOSE 83 94 65*  BUN 13.7 24* 14  CREATININE 0.4* 0.35* 0.29*  CALCIUM 8.2* 7.1* 6.8*  MG  --   --  2.1   Liver Function Tests:  Recent Labs Lab 11/03/13 1119 12/06/2013 1604 11/07/13 0553  AST 29 43* 45*  ALT 24 20 19   ALKPHOS 240* 169* 154*  BILITOT 0.22 <0.2* 0.4  PROT 5.7* 5.5* 5.5*  ALBUMIN 1.8* 1.8* 1.8*    Recent Labs Lab 11/27/2013 1605  LIPASE 39   No results found for  this basename: AMMONIA,  in the last 168 hours CBC:  Recent Labs Lab 11/03/13 1118 11/16/2013 1604 11/07/13 0553 11/07/13 0735  WBC 5.5 5.0 9.0 10.1  NEUTROABS 4.0 4.2  --  9.5*  HGB 10.3* 7.2* 11.7* 11.4*  HCT 32.5* 21.9* 34.3* 33.7*  MCV 95.0 92.4 90.3 89.9  PLT 594* PLATELET CLUMPS NOTED ON SMEAR, COUNT APPEARS ADEQUATE 334 377   Cardiac Enzymes:  Recent Labs Lab 11/07/13 0553 11/07/13 0735  TROPONINI <0.30 <0.30   BNP (last 3 results)  Recent Labs  07/29/13 2104  PROBNP 74.9   CBG: No results found for this basename: GLUCAP,  in the last 168 hours  No results found for this or any previous visit (from the past 240 hour(s)).   Studies: Dg Chest 1 View  11/07/2013   CLINICAL DATA:  Status post left thoracentesis  EXAM: CHEST - 1 VIEW  COMPARISON:  CT from earlier in the same day.  FINDINGS: Cardiac shadow is within normal limits. There is been significant reduction in left-sided pleural effusion with with significant expansion of the left lung. No pneumothorax is noted. Infiltrative changes are again noted in the right upper lobe and left base. Right-sided chest wall port is again seen.  IMPRESSION: No evidence of pneumothorax following thoracentesis.  Electronically Signed   By: Inez Catalina M.D.   On: 11/07/2013 14:39   Ct Chest W Contrast  11/07/2013   CLINICAL DATA:  Esophageal cancer, question esophageal ulceration  EXAM: CT CHEST, ABDOMEN, AND PELVIS WITH CONTRAST  TECHNIQUE: Multidetector CT imaging of the chest, abdomen and pelvis was performed following the standard protocol during bolus administration of intravenous contrast.  CONTRAST:  142mL OMNIPAQUE IOHEXOL 300 MG/ML  SOLN  COMPARISON:  DG RIBS UNILATERAL W/CHEST*L* dated 10/02/2013; NM PET IMAGE INITIAL (PI) SKULL BASE TO THIGH dated 08/19/2013; CT CHEST W/CM dated 07/31/2013  FINDINGS: CT CHEST FINDINGS  There is a port in the right anterior chest wall. No axillary lymphadenopathy. Enlarged left supraclavicular  lymph node is again noted measuring 14 x 27 mm.  There is a new large left pleural effusion occupying approximately 80% of the left hemi thorax volume. There is complete atelectasis of the left lower lobe and atelectasis of the lingula. Small right effusion is present. There is patchy airspace disease in the right upper lobe. This is new from prior. Esophageal mass is not demonstrated. Right hilar lymph node measuring 13 mm noted. No pericardial fluid.  CT ABDOMEN AND PELVIS FINDINGS  There is a percutaneous jejunostomy tube in place. Moderate volume of free fluid in the abdomen and pelvis. Status postcholecystectomy. Liver has a fine nodular appearance. There is some narrowing of the portal veins. There is evidence of portal hypertension with a perirectal venous collaterals. There is a low-density lesion in the tail the pancreas measuring 2.8 x 1.7 cm which is not on comparison CT (image 58, series 2). Spleen, kidneys are normal. The adrenal glands appear enlarged compared to 07/11/2013.  There is a fluid collection  Stomach on the small bowel and colon are unremarkable there is no evidence bowel obstruction. There is a fluid collection adjacent the 2nd portion the duodenum medially measuring 3.3 x 2.3 cm (image 60 of series 2).  Bladder is distended. Uterus is grossly normal. No pelvic lymphadenopathy. Anasarca sarcoma soft tissues. Skeletal metastasis demonstrated on comparison PET-CT scan are not well demonstrated.  IMPRESSION: 1. Dominant finding is large left pleural effusion occupying 80% of the left hemi thorax volume with associated atelectasis. 2. Patchy airspace disease in the right upper lobe represents pulmonary pneumonitis or infection. 3. New low-density lesion the tail of the pancreas with differential including focal pancreatitis versus a pancreatic metastasis. 4. Enlarged adrenal glands, increased compared to prior could also represent metastasis. 5. Increase in intraperitoneal free fluid and  ansarca of the soft tissues. This in part could relate to portal hypertension as there are perirectal venous collaterals. 6. Jejunostomy tube in place without evidence of complication. 7. No evidence of bowel obstruction. 8. Fluid collection adjacent to 2nd portion of the duodenum could relate pancreatitis or duodenitis. Cannot exclude early abscess. 9. Skeletal metastasis not well demonstrated.   Electronically Signed   By: Suzy Bouchard M.D.   On: 11/07/2013 08:47   Ct Abdomen Pelvis W Contrast  11/07/2013   CLINICAL DATA:  Esophageal cancer, question esophageal ulceration  EXAM: CT CHEST, ABDOMEN, AND PELVIS WITH CONTRAST  TECHNIQUE: Multidetector CT imaging of the chest, abdomen and pelvis was performed following the standard protocol during bolus administration of intravenous contrast.  CONTRAST:  14mL OMNIPAQUE IOHEXOL 300 MG/ML  SOLN  COMPARISON:  DG RIBS UNILATERAL W/CHEST*L* dated 10/02/2013; NM PET IMAGE INITIAL (PI) SKULL BASE TO THIGH dated 08/19/2013; CT CHEST W/CM dated 07/31/2013  FINDINGS: CT CHEST FINDINGS  There  is a port in the right anterior chest wall. No axillary lymphadenopathy. Enlarged left supraclavicular lymph node is again noted measuring 14 x 27 mm.  There is a new large left pleural effusion occupying approximately 80% of the left hemi thorax volume. There is complete atelectasis of the left lower lobe and atelectasis of the lingula. Small right effusion is present. There is patchy airspace disease in the right upper lobe. This is new from prior. Esophageal mass is not demonstrated. Right hilar lymph node measuring 13 mm noted. No pericardial fluid.  CT ABDOMEN AND PELVIS FINDINGS  There is a percutaneous jejunostomy tube in place. Moderate volume of free fluid in the abdomen and pelvis. Status postcholecystectomy. Liver has a fine nodular appearance. There is some narrowing of the portal veins. There is evidence of portal hypertension with a perirectal venous collaterals. There is  a low-density lesion in the tail the pancreas measuring 2.8 x 1.7 cm which is not on comparison CT (image 58, series 2). Spleen, kidneys are normal. The adrenal glands appear enlarged compared to 07/11/2013.  There is a fluid collection  Stomach on the small bowel and colon are unremarkable there is no evidence bowel obstruction. There is a fluid collection adjacent the 2nd portion the duodenum medially measuring 3.3 x 2.3 cm (image 60 of series 2).  Bladder is distended. Uterus is grossly normal. No pelvic lymphadenopathy. Anasarca sarcoma soft tissues. Skeletal metastasis demonstrated on comparison PET-CT scan are not well demonstrated.  IMPRESSION: 1. Dominant finding is large left pleural effusion occupying 80% of the left hemi thorax volume with associated atelectasis. 2. Patchy airspace disease in the right upper lobe represents pulmonary pneumonitis or infection. 3. New low-density lesion the tail of the pancreas with differential including focal pancreatitis versus a pancreatic metastasis. 4. Enlarged adrenal glands, increased compared to prior could also represent metastasis. 5. Increase in intraperitoneal free fluid and ansarca of the soft tissues. This in part could relate to portal hypertension as there are perirectal venous collaterals. 6. Jejunostomy tube in place without evidence of complication. 7. No evidence of bowel obstruction. 8. Fluid collection adjacent to 2nd portion of the duodenum could relate pancreatitis or duodenitis. Cannot exclude early abscess. 9. Skeletal metastasis not well demonstrated.   Electronically Signed   By: Suzy Bouchard M.D.   On: 11/07/2013 08:47   US Thoracentesis Asp Pleural Space W/img Guide  11/07/2013   CLINICAL DATA:  Left pleural effusion; GI bleed  EXAM: ULTRASOUND GUIDED left THORACENTESIS  COMPARISON:  None  FINDINGS: A total of approximately 1.4 L of yellow fluid was removed. A fluid sample wassent for laboratory analysis.  IMPRESSION: Successful  ultrasound guided left thoracentesis yielding 1.4 L of pleural fluid.  Read by: Jannifer Franklin PA-C  PROCEDURE: An ultrasound guided thoracentesis was thoroughly discussed with the patient and questions answered. The benefits, risks, alternatives and complications were also discussed. The patient understands and wishes to proceed with the procedure. Written consent was obtained.  Ultrasound was performed to localize and mark an adequate pocket of fluid in the left chest. The area was then prepped and draped in the normal sterile fashion. 1% Lidocaine was used for local anesthesia. Under ultrasound guidance a 19 gauge Yueh catheter was introduced. Thoracentesis was performed. The catheter was removed and a dressing applied.  Complications:  None   Electronically Signed   By: Markus Daft M.D.   On: 11/07/2013 15:52    Scheduled Meds: . alteplase  2 mg Intracatheter Once  .  levothyroxine  100 mcg Per Tube QAC breakfast  . methadone  30 mg Per Tube Q8H  . nicotine  14 mg Transdermal Daily  . pantoprazole (PROTONIX) IV  80 mg Intravenous Once  . sodium chloride  1,000 mL Intravenous Once   Continuous Infusions: . pantoprozole (PROTONIX) infusion 8 mg/hr (11/07/13 1457)    Time spent: 35 minutes  [facility] Pager [phone]. If 7PM-7AM, please contact night-coverage at [email or web address], password [facility] 11/07/2013, 5:15 PM  LOS: 1 day

## 2013-11-07 NOTE — Progress Notes (Signed)
INITIAL NUTRITION ASSESSMENT  DOCUMENTATION CODES Per approved criteria  -Severe malnutrition in the context of chronic illness   INTERVENTION: - When medically appropriate, resume Osmolite at 20 ml/hr. Increase by 10 ml/hr every 4 hours to goal rate of 60 ml/hr to provide 1728 kcal, 80 g protein, 1181 ml free water.  - RD to follow nutrition care plan  NUTRITION DIAGNOSIS: Inadequate enteral nutrition infusion related to GI bleed as evidenced by no current TF orders.   Goal: Patient will meet >/=90% of estimated nutrition needs  Monitor:  TF initiation and tolerance, GI status, weight, labs  Reason for Assessment: Malnutrition screening tool  48 y.o. female  Admitting Dx: GI Bleed  ASSESSMENT: Patient with a history of metastatic distal esophageal adenocarcinoma diagnosed in 07/2013, s/p radiation therapy and currently undergoing chemotherapy. She had a jejunostomy placed 09/2013. She is Osmolite 1.2, most recently running at 60 ml/hr over 18 hours (1296 kcal, 60 g protein, 886 ml free water). She had some difficulty advancing to goal rate of 80 ml/hr due to nausea. She does take liquids by mouth.   She was diagnosed with severe malnutrition in the context of acute illness with weight loss of 9% in 3 weeks on 09/04/2013, but her weight has been steadily increasing. She is still down 25% of her UBW 2 years ago with severe muscle and fat tissue loss.   Patient is NPO with no current TF orders due to GI bleed.   Height: Ht Readings from Last 1 Encounters:  11/08/2013 5\' 3"  (1.6 m)    Weight: Wt Readings from Last 1 Encounters:  11/08/2013 117 lb 11.6 oz (53.4 kg)    Ideal Body Weight: 115 pounds  % Ideal Body Weight: 102%  Wt Readings from Last 10 Encounters:  11/22/2013 117 lb 11.6 oz (53.4 kg)  10/29/13 113 lb 1.5 oz (51.3 kg)  10/20/13 107 lb 9.6 oz (48.807 kg)  10/16/13 109 lb 3.2 oz (49.533 kg)  10/08/13 104 lb 11.2 oz (47.492 kg)  09/26/13 106 lb 8 oz (48.308 kg)   09/20/13 116 lb 10 oz (52.9 kg)  09/20/13 116 lb 10 oz (52.9 kg)  09/09/13 109 lb 9.6 oz (49.714 kg)  09/03/13 108 lb 12.8 oz (49.351 kg)    Usual Body Weight: 155 pounds, February 2013  % Usual Body Weight: 25%  BMI:  Body mass index is 20.86 kg/(m^2). Patient is normal weight.   Estimated Nutritional Needs: Kcal: 1600-1750 kcal Protein: 70-85 g Fluid: >1.8 L/day  Skin: Intact  Diet Order: NPO  EDUCATION NEEDS: -No education needs identified at this time   Intake/Output Summary (Last 24 hours) at 11/07/13 1545 Last data filed at 11/07/13 1500  Gross per 24 hour  Intake 630.83 ml  Output    500 ml  Net 130.83 ml    Last BM: PTA   Labs:   Recent Labs Lab 11/03/13 1119 11/24/2013 1604 11/07/13 0553  NA 134* 134* 136*  K 4.1 3.8 3.4*  CL  --  97 98  CO2 27 26 24   BUN 13.7 24* 14  CREATININE 0.4* 0.35* 0.29*  CALCIUM 8.2* 7.1* 6.8*  MG  --   --  2.1  GLUCOSE 83 94 65*    CBG (last 3)  No results found for this basename: GLUCAP,  in the last 72 hours  Scheduled Meds: . alteplase  2 mg Intracatheter Once  . levothyroxine  100 mcg Per Tube QAC breakfast  . methadone  30 mg Per Tube  Q8H  . nicotine  14 mg Transdermal Daily  . pantoprazole (PROTONIX) IV  80 mg Intravenous Once  . sodium chloride  1,000 mL Intravenous Once    Continuous Infusions: . pantoprozole (PROTONIX) infusion 8 mg/hr (11/07/13 1457)    Past Medical History  Diagnosis Date  . Multiple sclerosis   . Irritable bowel syndrome   . Arthritis   . Pancreatitis   . Anxiety   . Depression   . Chronic pain   . Migraine   . PTSD (post-traumatic stress disorder)   . Methadone use   . PONV (postoperative nausea and vomiting)   . 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 Grave's disease   . hypothyroidism   . Neuromuscular disorder   . Substance abuse     Past Surgical History  Procedure Laterality Date  . Sphincterotomy    . Cholecystectomy    . Eus N/A  08/08/2013    Procedure: ESOPHAGEAL ENDOSCOPIC ULTRASOUND (EUS) RADIAL;  Surgeon: Alicia Beams, MD;  Location: [facility];  Service: Endoscopy;  Laterality: N/A;  . 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 Hernia repair    . Gastrostomy N/A 09/17/2013    Procedure: FEEDING JEJUNOSTOMY TUBE PLACEMENT;  Surgeon: Alicia Hector, MD;  Location: [place];  Service: General;  Laterality: N/A;    [name], RD, LDN Pager #: [phone] After-Hours Pager #: [phone]

## 2013-11-08 DIAGNOSIS — D62 Acute posthemorrhagic anemia: Secondary | ICD-10-CM | POA: Diagnosis present

## 2013-11-08 DIAGNOSIS — J90 Pleural effusion, not elsewhere classified: Secondary | ICD-10-CM

## 2013-11-08 DIAGNOSIS — R059 Cough, unspecified: Secondary | ICD-10-CM

## 2013-11-08 DIAGNOSIS — K922 Gastrointestinal hemorrhage, unspecified: Secondary | ICD-10-CM

## 2013-11-08 DIAGNOSIS — E0500 Thyrotoxicosis with diffuse goiter without thyrotoxic crisis or storm: Secondary | ICD-10-CM

## 2013-11-08 DIAGNOSIS — R05 Cough: Secondary | ICD-10-CM | POA: Clinically undetermined

## 2013-11-08 DIAGNOSIS — C155 Malignant neoplasm of lower third of esophagus: Secondary | ICD-10-CM

## 2013-11-08 LAB — BASIC METABOLIC PANEL
BUN: 10 mg/dL (ref 6–23)
CO2: 23 mEq/L (ref 19–32)
Calcium: 5.9 mg/dL — CL (ref 8.4–10.5)
Chloride: 97 mEq/L (ref 96–112)
Creatinine, Ser: 0.38 mg/dL — ABNORMAL LOW (ref 0.50–1.10)
GLUCOSE: 86 mg/dL (ref 70–99)
POTASSIUM: 2.9 meq/L — AB (ref 3.7–5.3)
Sodium: 133 mEq/L — ABNORMAL LOW (ref 137–147)

## 2013-11-08 LAB — GLUCOSE, SEROUS FLUID: Glucose, Fluid: 73 mg/dL

## 2013-11-08 LAB — CBC
HCT: 29.4 % — ABNORMAL LOW (ref 36.0–46.0)
Hemoglobin: 9.9 g/dL — ABNORMAL LOW (ref 12.0–15.0)
MCH: 30.2 pg (ref 26.0–34.0)
MCHC: 33.7 g/dL (ref 30.0–36.0)
MCV: 89.6 fL (ref 78.0–100.0)
Platelets: 324 10*3/uL (ref 150–400)
RBC: 3.28 MIL/uL — AB (ref 3.87–5.11)
RDW: 16.9 % — ABNORMAL HIGH (ref 11.5–15.5)
WBC: 7.7 10*3/uL (ref 4.0–10.5)

## 2013-11-08 LAB — GLUCOSE, CAPILLARY
GLUCOSE-CAPILLARY: 125 mg/dL — AB (ref 70–99)
Glucose-Capillary: 114 mg/dL — ABNORMAL HIGH (ref 70–99)

## 2013-11-08 LAB — MAGNESIUM: MAGNESIUM: 2 mg/dL (ref 1.5–2.5)

## 2013-11-08 MED ORDER — LEVOTHYROXINE SODIUM 125 MCG PO TABS
125.0000 ug | ORAL_TABLET | Freq: Every day | ORAL | Status: DC
  Administered 2013-11-09: 125 ug
  Filled 2013-11-08 (×2): qty 1

## 2013-11-08 MED ORDER — SODIUM CHLORIDE 0.9 % IJ SOLN: 10.0000 mL | INTRAMUSCULAR | Status: DC | PRN

## 2013-11-08 MED ORDER — OSMOLITE 1.2 CAL PO LIQD
1000.0000 mL | ORAL | Status: DC
  Administered 2013-11-08 – 2013-11-09 (×2): 1000 mL
  Filled 2013-11-08 (×3): qty 1000

## 2013-11-08 MED ORDER — POTASSIUM CHLORIDE 10 MEQ/100ML IV SOLN
10.0000 meq | INTRAVENOUS | Status: AC
  Administered 2013-11-08 (×2): 10 meq via INTRAVENOUS
  Filled 2013-11-08 (×3): qty 100

## 2013-11-08 MED ORDER — PROMETHAZINE HCL 6.25 MG/5ML PO SYRP
12.5000 mg | ORAL_SOLUTION | Freq: Four times a day (QID) | ORAL | Status: DC | PRN
  Administered 2013-11-08: 12.5 mg
  Filled 2013-11-08: qty 10

## 2013-11-08 MED ORDER — SODIUM CHLORIDE 0.9 % IV SOLN
16.0000 mg | Freq: Two times a day (BID) | INTRAVENOUS | Status: DC | PRN
  Filled 2013-11-08: qty 8

## 2013-11-08 MED ORDER — POTASSIUM CHLORIDE 10 MEQ/50ML IV SOLN
10.0000 meq | INTRAVENOUS | Status: AC
  Administered 2013-11-08 (×2): 10 meq via INTRAVENOUS
  Filled 2013-11-08 (×2): qty 50

## 2013-11-08 MED ORDER — PANTOPRAZOLE SODIUM 40 MG PO PACK
40.0000 mg | PACK | Freq: Two times a day (BID) | ORAL | Status: DC
  Administered 2013-11-08 – 2013-11-09 (×2): 40 mg
  Filled 2013-11-08 (×6): qty 20

## 2013-11-08 MED ORDER — SUCRALFATE 1 GM/10ML PO SUSP
1.0000 g | Freq: Three times a day (TID) | ORAL | Status: DC
  Administered 2013-11-08 – 2013-11-09 (×3): 1 g via ORAL
  Filled 2013-11-08 (×12): qty 10

## 2013-11-08 MED ORDER — GUAIFENESIN-DM 100-10 MG/5ML PO SYRP: 5.0000 mL | ORAL_SOLUTION | ORAL | Status: DC | PRN

## 2013-11-08 NOTE — ED Provider Notes (Signed)
Medical screening examination/treatment/procedure(s) were performed by non-physician practitioner and as supervising physician I was immediately available for consultation/collaboration.  EKG Interpretation    Date/Time:    Ventricular Rate:    PR Interval:    QRS Duration:   QT Interval:    QTC Calculation:   R Axis:     Text Interpretation:                Wandra Arthurs, MD 11/08/13 2258

## 2013-11-08 NOTE — Progress Notes (Signed)
[facility] PROGRESS NOTE  [name] WGN:[id number] DOB: Jun 28, 1966 DOA: 11/23/2013 PCP: [name], MD  Assessment/Plan:   GI bleed resolved. Resume TF. Continue protonix. Monitor hgb Active Problems: Pleural effusion s/p thoracentesis. Studies pending.  Feels better Acute blood loss anemia improved after transfusion   Graves' disease   Hypokalemia: replete iv   Multiple sclerosis   Carcinoma of distal third of esophagus: DNR. See Dr. Mariana Kaufman note. She has consuted PCM    Protein-calorie malnutrition, severe Chronic pain   Cellulitis of toes, multiple, right: better on vancomycin   Tobacco use disorder  Code Status:  full Family Communication:   Disposition Plan:  home   HPI/Subjective: No bleeding. Feels better today  Objective: Filed Vitals:   11/08/13 0617  BP: 116/73  Pulse: 104  Temp: 98.7 F (37.1 C)  Resp: 20    Intake/Output Summary (Last 24 hours) at 11/08/13 1209 Last data filed at 11/08/13 0840  Gross per 24 hour  Intake 1161.67 ml  Output      0 ml  Net 1161.67 ml   Filed Weights   12/01/2013 1406 12/03/2013 1954  Weight: 51.256 kg (113 lb) 53.4 kg (117 lb 11.6 oz)    Exam:   General:  Cachectic. More alert  Cardiovascular: RRR without mGR  Respiratory: diminished on left. no WRR  Abdomen: S, NT, ND  Ext: no CCE  Basic Metabolic Panel:  Recent Labs Lab 11/03/13 1119 11/17/2013 1604 11/07/13 0553 11/08/13 0500  NA 134* 134* 136* 133*  K 4.1 3.8 3.4* 2.9*  CL  --  97 98 97  CO2 27 26 24 23   GLUCOSE 83 94 65* 86  BUN 13.7 24* 14 10  CREATININE 0.4* 0.35* 0.29* 0.38*  CALCIUM 8.2* 7.1* 6.8* 5.9*  MG  --   --  2.1 2.0   Liver Function Tests:  Recent Labs Lab 11/03/13 1119 11/26/2013 1604 11/07/13 0553  AST 29 43* 45*  ALT 24 20 19   ALKPHOS 240* 169* 154*  BILITOT 0.22 <0.2* 0.4  PROT 5.7* 5.5* 5.5*  ALBUMIN 1.8* 1.8* 1.8*    Recent Labs Lab 11/24/2013 1605  LIPASE 39   No results found for this basename:  AMMONIA,  in the last 168 hours CBC:  Recent Labs Lab 11/03/13 1118 11/25/2013 1604 11/07/13 0553 11/07/13 0735 11/08/13 0500  WBC 5.5 5.0 9.0 10.1 7.7  NEUTROABS 4.0 4.2  --  9.5*  --   HGB 10.3* 7.2* 11.7* 11.4* 9.9*  HCT 32.5* 21.9* 34.3* 33.7* 29.4*  MCV 95.0 92.4 90.3 89.9 89.6  PLT 594* PLATELET CLUMPS NOTED ON SMEAR, COUNT APPEARS ADEQUATE 334 377 324   Cardiac Enzymes:  Recent Labs Lab 11/07/13 0553 11/07/13 0735  TROPONINI <0.30 <0.30   BNP (last 3 results)  Recent Labs  07/29/13 2104  PROBNP 74.9   CBG: No results found for this basename: GLUCAP,  in the last 168 hours  Recent Results (from the past 240 hour(s))  BODY FLUID CULTURE     Status: None   Collection Time    11/07/13  2:18 PM      Result Value Range Status   Specimen Description PLEURAL LEFT FLUID   Final   Special Requests NONE   Final   Gram Stain     Final   Value: RARE WBC PRESENT, PREDOMINANTLY PMN     NO ORGANISMS SEEN     Performed at [facility]   Culture PENDING   Incomplete   Report  Status PENDING   Incomplete     Studies: Dg Chest 1 View  11/07/2013   CLINICAL DATA:  Status post left thoracentesis  EXAM: CHEST - 1 VIEW  COMPARISON:  CT from earlier in the same day.  FINDINGS: Cardiac shadow is within normal limits. There is been significant reduction in left-sided pleural effusion with with significant expansion of the left lung. No pneumothorax is noted. Infiltrative changes are again noted in the right upper lobe and left base. Right-sided chest wall port is again seen.  IMPRESSION: No evidence of pneumothorax following thoracentesis.   Electronically Signed   By: Inez Catalina M.D.   On: 11/07/2013 14:39   Ct Chest W Contrast  11/07/2013   CLINICAL DATA:  Esophageal cancer, question esophageal ulceration  EXAM: CT CHEST, ABDOMEN, AND PELVIS WITH CONTRAST  TECHNIQUE: Multidetector CT imaging of the chest, abdomen and pelvis was performed following the standard protocol  during bolus administration of intravenous contrast.  CONTRAST:  161mL OMNIPAQUE IOHEXOL 300 MG/ML  SOLN  COMPARISON:  DG RIBS UNILATERAL W/CHEST*L* dated 10/02/2013; NM PET IMAGE INITIAL (PI) SKULL BASE TO THIGH dated 08/19/2013; CT CHEST W/CM dated 07/31/2013  FINDINGS: CT CHEST FINDINGS  There is a port in the right anterior chest wall. No axillary lymphadenopathy. Enlarged left supraclavicular lymph node is again noted measuring 14 x 27 mm.  There is a new large left pleural effusion occupying approximately 80% of the left hemi thorax volume. There is complete atelectasis of the left lower lobe and atelectasis of the lingula. Small right effusion is present. There is patchy airspace disease in the right upper lobe. This is new from prior. Esophageal mass is not demonstrated. Right hilar lymph node measuring 13 mm noted. No pericardial fluid.  CT ABDOMEN AND PELVIS FINDINGS  There is a percutaneous jejunostomy tube in place. Moderate volume of free fluid in the abdomen and pelvis. Status postcholecystectomy. Liver has a fine nodular appearance. There is some narrowing of the portal veins. There is evidence of portal hypertension with a perirectal venous collaterals. There is a low-density lesion in the tail the pancreas measuring 2.8 x 1.7 cm which is not on comparison CT (image 58, series 2). Spleen, kidneys are normal. The adrenal glands appear enlarged compared to 07/11/2013.  There is a fluid collection  Stomach on the small bowel and colon are unremarkable there is no evidence bowel obstruction. There is a fluid collection adjacent the 2nd portion the duodenum medially measuring 3.3 x 2.3 cm (image 60 of series 2).  Bladder is distended. Uterus is grossly normal. No pelvic lymphadenopathy. Anasarca sarcoma soft tissues. Skeletal metastasis demonstrated on comparison PET-CT scan are not well demonstrated.  IMPRESSION: 1. Dominant finding is large left pleural effusion occupying 80% of the left hemi thorax  volume with associated atelectasis. 2. Patchy airspace disease in the right upper lobe represents pulmonary pneumonitis or infection. 3. New low-density lesion the tail of the pancreas with differential including focal pancreatitis versus a pancreatic metastasis. 4. Enlarged adrenal glands, increased compared to prior could also represent metastasis. 5. Increase in intraperitoneal free fluid and ansarca of the soft tissues. This in part could relate to portal hypertension as there are perirectal venous collaterals. 6. Jejunostomy tube in place without evidence of complication. 7. No evidence of bowel obstruction. 8. Fluid collection adjacent to 2nd portion of the duodenum could relate pancreatitis or duodenitis. Cannot exclude early abscess. 9. Skeletal metastasis not well demonstrated.   Electronically Signed   By: Nicole Kindred  Leonia Reeves M.D.   On: 11/07/2013 08:47   Ct Abdomen Pelvis W Contrast  11/07/2013   CLINICAL DATA:  Esophageal cancer, question esophageal ulceration  EXAM: CT CHEST, ABDOMEN, AND PELVIS WITH CONTRAST  TECHNIQUE: Multidetector CT imaging of the chest, abdomen and pelvis was performed following the standard protocol during bolus administration of intravenous contrast.  CONTRAST:  149mL OMNIPAQUE IOHEXOL 300 MG/ML  SOLN  COMPARISON:  DG RIBS UNILATERAL W/CHEST*L* dated 10/02/2013; NM PET IMAGE INITIAL (PI) SKULL BASE TO THIGH dated 08/19/2013; CT CHEST W/CM dated 07/31/2013  FINDINGS: CT CHEST FINDINGS  There is a port in the right anterior chest wall. No axillary lymphadenopathy. Enlarged left supraclavicular lymph node is again noted measuring 14 x 27 mm.  There is a new large left pleural effusion occupying approximately 80% of the left hemi thorax volume. There is complete atelectasis of the left lower lobe and atelectasis of the lingula. Small right effusion is present. There is patchy airspace disease in the right upper lobe. This is new from prior. Esophageal mass is not demonstrated. Right  hilar lymph node measuring 13 mm noted. No pericardial fluid.  CT ABDOMEN AND PELVIS FINDINGS  There is a percutaneous jejunostomy tube in place. Moderate volume of free fluid in the abdomen and pelvis. Status postcholecystectomy. Liver has a fine nodular appearance. There is some narrowing of the portal veins. There is evidence of portal hypertension with a perirectal venous collaterals. There is a low-density lesion in the tail the pancreas measuring 2.8 x 1.7 cm which is not on comparison CT (image 58, series 2). Spleen, kidneys are normal. The adrenal glands appear enlarged compared to 07/11/2013.  There is a fluid collection  Stomach on the small bowel and colon are unremarkable there is no evidence bowel obstruction. There is a fluid collection adjacent the 2nd portion the duodenum medially measuring 3.3 x 2.3 cm (image 60 of series 2).  Bladder is distended. Uterus is grossly normal. No pelvic lymphadenopathy. Anasarca sarcoma soft tissues. Skeletal metastasis demonstrated on comparison PET-CT scan are not well demonstrated.  IMPRESSION: 1. Dominant finding is large left pleural effusion occupying 80% of the left hemi thorax volume with associated atelectasis. 2. Patchy airspace disease in the right upper lobe represents pulmonary pneumonitis or infection. 3. New low-density lesion the tail of the pancreas with differential including focal pancreatitis versus a pancreatic metastasis. 4. Enlarged adrenal glands, increased compared to prior could also represent metastasis. 5. Increase in intraperitoneal free fluid and ansarca of the soft tissues. This in part could relate to portal hypertension as there are perirectal venous collaterals. 6. Jejunostomy tube in place without evidence of complication. 7. No evidence of bowel obstruction. 8. Fluid collection adjacent to 2nd portion of the duodenum could relate pancreatitis or duodenitis. Cannot exclude early abscess. 9. Skeletal metastasis not well demonstrated.    Electronically Signed   By: Suzy Bouchard M.D.   On: 11/07/2013 08:47   US Thoracentesis Asp Pleural Space W/img Guide  11/07/2013   CLINICAL DATA:  Left pleural effusion; GI bleed  EXAM: ULTRASOUND GUIDED left THORACENTESIS  COMPARISON:  None  FINDINGS: A total of approximately 1.4 L of yellow fluid was removed. A fluid sample wassent for laboratory analysis.  IMPRESSION: Successful ultrasound guided left thoracentesis yielding 1.4 L of pleural fluid.  Read by: Jannifer Franklin PA-C  PROCEDURE: An ultrasound guided thoracentesis was thoroughly discussed with the patient and questions answered. The benefits, risks, alternatives and complications were also discussed. The patient understands and wishes  to proceed with the procedure. Written consent was obtained.  Ultrasound was performed to localize and mark an adequate pocket of fluid in the left chest. The area was then prepped and draped in the normal sterile fashion. 1% Lidocaine was used for local anesthesia. Under ultrasound guidance a 19 gauge Yueh catheter was introduced. Thoracentesis was performed. The catheter was removed and a dressing applied.  Complications:  None   Electronically Signed   By: Markus Daft M.D.   On: 11/07/2013 15:52    Scheduled Meds: . feeding supplement (OSMOLITE 1.2 CAL)  1,000 mL Per Tube Q24H  . levothyroxine  100 mcg Per Tube QAC breakfast  . methadone  30 mg Per Tube Q8H  . nicotine  14 mg Transdermal Daily  . pantoprazole (PROTONIX) IV  40 mg Intravenous Q12H  . potassium chloride  10 mEq Intravenous Q1 Hr x 4  . potassium chloride  10 mEq Intravenous Q1 Hr x 2  . sodium chloride  1,000 mL Intravenous Once  . vancomycin  1,500 mg Intravenous Q24H   Continuous Infusions: . dextrose 5 % and 0.9 % NaCl with KCl 40 mEq/L 100 mL/hr at 11/07/13 1859    Time spent: 35 minutes  [facility] Pager [phone]. If 7PM-7AM, please contact night-coverage at [email or web address], password [email or web address] 11/08/2013,  12:09 PM  LOS: 2 days

## 2013-11-08 NOTE — Progress Notes (Signed)
11/08/2013, 10:39 AM  Hospital day 3 Antibiotics: IV vancomycin day 2 Chemotherapy: cycle 2 palliative FOLFOX 12-29 thru 11-05-13  Patient seen, alone for visit. Discussed with RN on unit.  Subjective: Feeling a little better this AM. Pain left lower chest/ left upper abdomen better controlled with prn IV dilaudid (using 2 mg ~ every 3 hours thru night; she remains on methadone 30 mg q 8 hrs additionally). Still nausea without vomiting this AM. PAC drew well this AM, peripheral IV left upper arm "feels tight", that placed when PAC not functioning well prior to TPA last pm. Passing gas, no BM. Tolerating sips liquids. Some cough, just clear secretions, breathing better since thoracentesis yesterday. Knees now erythematous and "burning", feet better.  We discussed CT finding of mass in tail of pancreas, new since 07-2013, likely met. Esophageal mass not obvious now, post RT, and no obvious bone lesions on that scan. Cytology of pleural fluid pending.  We have discussed difficulty in tolerating chemo due to overall poor status and multiple problems. Unless situation improves significantly, which is not expected, further chemo likely will be more detrimental than beneficial. Strict comfort care with assistance of Hospice will likely be most helpful option; patient is concerned especially about her 47 yo child as well as older children, and needs more family support, as with Hospice. She is in agreement with Hospice discussion, palliative/Hospice consult requested now tho patient aware that consult may not happen today.  Objective: Vital signs in last 24 hours: Blood pressure 116/73, pulse 104, temperature 98.7 F (37.1 C), temperature source Oral, resp. rate 20, height _0  (1.6 m), weight 117 lb 11.6 oz (53.4 kg), last menstrual period 04/28/2011, SpO2 90.00%.   Intake/Output from previous day: 01/02 0701 - 01/03 0700 In: 1272.5 [P.O.:240; I.V.:1032.5] Out: 500 [Urine:500] Intake/Output this  shift: Total I/O In: 120 [Other:120] Out: -   Physical exam: Awake, alert, oriented and appropriate, looks mildly uncomfortable but NAD lying in bed at 30 degrees on RA. Respirations not labored, no coughing. PERRL, not icteric. Mouth dry. PAC site ok, infusing D5NS + 40 KCl at 100 cc/hr. Locked IV left upper arm site ok however new swelling LUE arm and hand. No JVD. Lungs with no wheezes or rales, breath sounds heard into lower left fields with some slight crackles there. Abdomen slightly distended, soft, J tube dressing clean. LE 1+ pedal edema, with erythema almost gone from toes bilaterally. Knees have confluent erythema without swelling, looks same as on feet previously. No cords or other tenderness. Moves all extremities in bed. Speech fluent and appropriate.  Lab Results:  Recent Labs  11/07/13 0735 11/08/13 0500  WBC 10.1 7.7  HGB 11.4* 9.9*  HCT 33.7* 29.4*  PLT 377 324   BMET  Recent Labs  11/07/13 0553 11/08/13 0500  NA 136* 133*  K 3.4* 2.9*  CL 98 97  CO2 24 23  GLUCOSE 65* 86  BUN 14 10  CREATININE 0.29* 0.38*  CALCIUM 6.8* 5.9*   Potassium runs ordered after discussion with nursing  Studies/Results: Dg Chest 1 View  11/07/2013   CLINICAL DATA:  Status post left thoracentesis  EXAM: CHEST - 1 VIEW  COMPARISON:  CT from earlier in the same day.  FINDINGS: Cardiac shadow is within normal limits. There is been significant reduction in left-sided pleural effusion with with significant expansion of the left lung. No pneumothorax is noted. Infiltrative changes are again noted in the right upper lobe and left base. Right-sided chest wall port  is again seen.  IMPRESSION: No evidence of pneumothorax following thoracentesis.   Electronically Signed   By: Inez Catalina M.D.   On: 11/07/2013 14:39   Ct Chest W Contrast  11/07/2013   CLINICAL DATA:  Esophageal cancer, question esophageal ulceration  EXAM: CT CHEST, ABDOMEN, AND PELVIS WITH CONTRAST  TECHNIQUE: Multidetector CT  imaging of the chest, abdomen and pelvis was performed following the standard protocol during bolus administration of intravenous contrast.  CONTRAST:  19mL OMNIPAQUE IOHEXOL 300 MG/ML  SOLN  COMPARISON:  DG RIBS UNILATERAL W/CHEST*L* dated 10/02/2013; NM PET IMAGE INITIAL (PI) SKULL BASE TO THIGH dated 08/19/2013; CT CHEST W/CM dated 07/31/2013  FINDINGS: CT CHEST FINDINGS  There is a port in the right anterior chest wall. No axillary lymphadenopathy. Enlarged left supraclavicular lymph node is again noted measuring 14 x 27 mm.  There is a new large left pleural effusion occupying approximately 80% of the left hemi thorax volume. There is complete atelectasis of the left lower lobe and atelectasis of the lingula. Small right effusion is present. There is patchy airspace disease in the right upper lobe. This is new from prior. Esophageal mass is not demonstrated. Right hilar lymph node measuring 13 mm noted. No pericardial fluid.  CT ABDOMEN AND PELVIS FINDINGS  There is a percutaneous jejunostomy tube in place. Moderate volume of free fluid in the abdomen and pelvis. Status postcholecystectomy. Liver has a fine nodular appearance. There is some narrowing of the portal veins. There is evidence of portal hypertension with a perirectal venous collaterals. There is a low-density lesion in the tail the pancreas measuring 2.8 x 1.7 cm which is not on comparison CT (image 58, series 2). Spleen, kidneys are normal. The adrenal glands appear enlarged compared to 07/11/2013.  There is a fluid collection  Stomach on the small bowel and colon are unremarkable there is no evidence bowel obstruction. There is a fluid collection adjacent the 2nd portion the duodenum medially measuring 3.3 x 2.3 cm (image 60 of series 2).  Bladder is distended. Uterus is grossly normal. No pelvic lymphadenopathy. Anasarca sarcoma soft tissues. Skeletal metastasis demonstrated on comparison PET-CT scan are not well demonstrated.  IMPRESSION: 1.  Dominant finding is large left pleural effusion occupying 80% of the left hemi thorax volume with associated atelectasis. 2. Patchy airspace disease in the right upper lobe represents pulmonary pneumonitis or infection. 3. New low-density lesion the tail of the pancreas with differential including focal pancreatitis versus a pancreatic metastasis. 4. Enlarged adrenal glands, increased compared to prior could also represent metastasis. 5. Increase in intraperitoneal free fluid and ansarca of the soft tissues. This in part could relate to portal hypertension as there are perirectal venous collaterals. 6. Jejunostomy tube in place without evidence of complication. 7. No evidence of bowel obstruction. 8. Fluid collection adjacent to 2nd portion of the duodenum could relate pancreatitis or duodenitis. Cannot exclude early abscess. 9. Skeletal metastasis not well demonstrated.   Electronically Signed   By: Suzy Bouchard M.D.   On: 11/07/2013 08:47   Ct Abdomen Pelvis W Contrast  11/07/2013   CLINICAL DATA:  Esophageal cancer, question esophageal ulceration  EXAM: CT CHEST, ABDOMEN, AND PELVIS WITH CONTRAST  TECHNIQUE: Multidetector CT imaging of the chest, abdomen and pelvis was performed following the standard protocol during bolus administration of intravenous contrast.  CONTRAST:  158mL OMNIPAQUE IOHEXOL 300 MG/ML  SOLN  COMPARISON:  DG RIBS UNILATERAL W/CHEST*L* dated 10/02/2013; NM PET IMAGE INITIAL (PI) SKULL BASE TO THIGH dated  08/19/2013; CT CHEST W/CM dated 07/31/2013  FINDINGS: CT CHEST FINDINGS  There is a port in the right anterior chest wall. No axillary lymphadenopathy. Enlarged left supraclavicular lymph node is again noted measuring 14 x 27 mm.  There is a new large left pleural effusion occupying approximately 80% of the left hemi thorax volume. There is complete atelectasis of the left lower lobe and atelectasis of the lingula. Small right effusion is present. There is patchy airspace disease in the  right upper lobe. This is new from prior. Esophageal mass is not demonstrated. Right hilar lymph node measuring 13 mm noted. No pericardial fluid.  CT ABDOMEN AND PELVIS FINDINGS  There is a percutaneous jejunostomy tube in place. Moderate volume of free fluid in the abdomen and pelvis. Status postcholecystectomy. Liver has a fine nodular appearance. There is some narrowing of the portal veins. There is evidence of portal hypertension with a perirectal venous collaterals. There is a low-density lesion in the tail the pancreas measuring 2.8 x 1.7 cm which is not on comparison CT (image 58, series 2). Spleen, kidneys are normal. The adrenal glands appear enlarged compared to 07/11/2013.  There is a fluid collection  Stomach on the small bowel and colon are unremarkable there is no evidence bowel obstruction. There is a fluid collection adjacent the 2nd portion the duodenum medially measuring 3.3 x 2.3 cm (image 60 of series 2).  Bladder is distended. Uterus is grossly normal. No pelvic lymphadenopathy. Anasarca sarcoma soft tissues. Skeletal metastasis demonstrated on comparison PET-CT scan are not well demonstrated.  IMPRESSION: 1. Dominant finding is large left pleural effusion occupying 80% of the left hemi thorax volume with associated atelectasis. 2. Patchy airspace disease in the right upper lobe represents pulmonary pneumonitis or infection. 3. New low-density lesion the tail of the pancreas with differential including focal pancreatitis versus a pancreatic metastasis. 4. Enlarged adrenal glands, increased compared to prior could also represent metastasis. 5. Increase in intraperitoneal free fluid and ansarca of the soft tissues. This in part could relate to portal hypertension as there are perirectal venous collaterals. 6. Jejunostomy tube in place without evidence of complication. 7. No evidence of bowel obstruction. 8. Fluid collection adjacent to 2nd portion of the duodenum could relate pancreatitis or  duodenitis. Cannot exclude early abscess. 9. Skeletal metastasis not well demonstrated.   Electronically Signed   By: Suzy Bouchard M.D.   On: 11/07/2013 08:47   US Thoracentesis Asp Pleural Space W/img Guide  11/07/2013   CLINICAL DATA:  Left pleural effusion; GI bleed  EXAM: ULTRASOUND GUIDED left THORACENTESIS  COMPARISON:  None  FINDINGS: A total of approximately 1.4 L of yellow fluid was removed. A fluid sample wassent for laboratory analysis.  IMPRESSION: Successful ultrasound guided left thoracentesis yielding 1.4 L of pleural fluid.  Read by: Jannifer Franklin PA-C  PROCEDURE: An ultrasound guided thoracentesis was thoroughly discussed with the patient and questions answered. The benefits, risks, alternatives and complications were also discussed. The patient understands and wishes to proceed with the procedure. Written consent was obtained.  Ultrasound was performed to localize and mark an adequate pocket of fluid in the left chest. The area was then prepped and draped in the normal sterile fashion. 1% Lidocaine was used for local anesthesia. Under ultrasound guidance a 19 gauge Yueh catheter was introduced. Thoracentesis was performed. The catheter was removed and a dressing applied.  Complications:  None   Electronically Signed   By: Markus Daft M.D.   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 She has had 2 cycles of FOLFOX in palliative attempt, but overall situation probably best managed now with Hospice instead of further chemo. Pain and nausea not well controlled (note I spoke with Hospice MD Clifton James .Monguilod last week by phone re pain management and methadone dosing, such that he is aware of this  case). I have added prn phenergan due to QT prolongation history, instead of ativan, tho Hospice MD may recommend otherwise. Palliative care/ Hospice consult requested. 2.DNR, patient's request 3.Rectal bleeding on admission: none today. Hgb lower, which may be with some rehydration. Agree with Dr Benson Norway that observation best now. Would not use prophylactic anticoagulation yet; try SCDs if tolerates. 4.chronic pancreatitis since cholecystectomy 1993, some better with sphincterotomy and stents 2 years ago 5.hypokalemia recurrent: note off IVF most of yesterday. Runs + K in maintenance IVF now. This was problem preceding cancer treatment 6.PAC again functioning well. DC peripheral IV LUE with swelling there. Elevate and follow 7.Social concerns: single mother, 4 children ages 69-20, one autistic. Patient's mother assisting. 8.painful areas of erythema LE: feet much better now, similar new areas on knees. Not clearly superficial thrombophlebitis, no DVTs by recent outpatient venous dopplers. Begun on IV vanc last pm by Dr Conley Canal 9. On maintenance methadone at ~ maximal doses, this by [facility] ([facility] prior to diagnosis of esophageal cancer, however he is no longer managing this medication. Suggest continuing present methadone 30 mg per J tube q 8 hrs with additional prn dilaudid for now 10. Long QT syndrome per EMR, ? due to methadone 11.long tobacco recently DCd 12. History multiple sclerosis 13.severe protein calorie malnutrition: jejunostomy placed as unable to swallow adequately. Resume tube feedings slowly as tolerates. 14.flu vaccine done 15.hypothyroid since treatment of Graves, on replacemet   Please contact our MD on call if needed prior to my next visit  [phone]. thanks [place]

## 2013-11-08 NOTE — Progress Notes (Signed)
    Progress Note   Subjective  no complaints, drowsy. No further bleeding. J-tube feedings just initiated.    Objective   Vital signs in last 24 hours: Temp:  [97.8 F (36.6 C)-98.7 F (37.1 C)] 98.7 F (37.1 C) (01/03 0617) Pulse Rate:  [95-104] 104 (01/03 0617) Resp:  [12-20] 20 (01/03 0617) BP: (108-118)/(61-78) 116/73 mmHg (01/03 0617) SpO2:  [90 %-99 %] 90 % (01/03 0617) Last BM Date: 11/08/13 General:    Thin white female in NAD Abdomen:  Soft, nontender,normal bowel sounds. Extremities:  Edema/erythema of right food.  Neurologic:  Drowsy but oriented.  Psych:  Cooperative. pleasant.  Lab Results:  Recent Labs  11/07/13 0553 11/07/13 0735 11/08/13 0500  WBC 9.0 10.1 7.7  HGB 11.7* 11.4* 9.9*  HCT 34.3* 33.7* 29.4*  PLT 334 377 324   BMET  Recent Labs  2013/11/08 1604 11/07/13 0553 11/08/13 0500  NA 134* 136* 133*  K 3.8 3.4* 2.9*  CL 97 98 97  CO2 26 24 23   GLUCOSE 94 65* 86  BUN 24* 14 10  CREATININE 0.35* 0.29* 0.38*  CALCIUM 7.1* 6.8* 5.9*   LFT  Recent Labs  11/07/13 0553  PROT 5.5*  ALBUMIN 1.8*  AST 45*  ALT 19  ALKPHOS 154*  BILITOT 0.4     Assessment / Plan:   1. Metastatic esophageal cancer, sounds like she has tolerated chemo. Palliative Care / Hospice to see. Getting nutrition via J-tube   2. Hematochezia. Resolved. Patient well known to Dr. Benson Norway, had colonoscopy in 2011.    3. Anemia of acute blood loss, s/p 2 units of blood. Hgb 9.9 today (it went up from 7.2 to 11.7 gms after only 2 units of blood but now equilibrating). Today's hgb of 9.9 more appropriate reflection of 2 units of blood.     LOS: 2 days   Tye Savoy  11/08/2013, 10:55 AM  Agree with Ms. Vanita Ingles assessment and plan.Bleeding stopped at this point. Observation for now. Gatha Mayer, MD, [facility]

## 2013-11-08 NOTE — Progress Notes (Signed)
Nutrition Brief Note  Received consult for initiation of J-tube feeds.  Osmolite 1.2 was started per RD recommendations on 1/03. Increase by 10 ml/hr every 8 hours to goal rate of 60 ml/hr to provide 1728 kcal, 80 g protein, 1181 ml free water.   Adult tube feeding protocol ordered  No additional nutrition interventions at this time. RD to follow per protocols  Atlee Abide [place] LDN Clinical Dietitian [phone]

## 2013-11-09 DIAGNOSIS — D62 Acute posthemorrhagic anemia: Secondary | ICD-10-CM

## 2013-11-09 DIAGNOSIS — E876 Hypokalemia: Secondary | ICD-10-CM

## 2013-11-09 DIAGNOSIS — C801 Malignant (primary) neoplasm, unspecified: Secondary | ICD-10-CM

## 2013-11-09 DIAGNOSIS — C7952 Secondary malignant neoplasm of bone marrow: Secondary | ICD-10-CM

## 2013-11-09 DIAGNOSIS — C7951 Secondary malignant neoplasm of bone: Secondary | ICD-10-CM

## 2013-11-09 LAB — CBC
HCT: 30.2 % — ABNORMAL LOW (ref 36.0–46.0)
Hemoglobin: 10.1 g/dL — ABNORMAL LOW (ref 12.0–15.0)
MCH: 30.2 pg (ref 26.0–34.0)
MCHC: 33.4 g/dL (ref 30.0–36.0)
MCV: 90.4 fL (ref 78.0–100.0)
Platelets: 312 10*3/uL (ref 150–400)
RBC: 3.34 MIL/uL — ABNORMAL LOW (ref 3.87–5.11)
RDW: 16.5 % — AB (ref 11.5–15.5)
WBC: 10.2 10*3/uL (ref 4.0–10.5)

## 2013-11-09 LAB — GLUCOSE, CAPILLARY
GLUCOSE-CAPILLARY: 147 mg/dL — AB (ref 70–99)
GLUCOSE-CAPILLARY: 125 mg/dL — AB (ref 70–99)
Glucose-Capillary: 118 mg/dL — ABNORMAL HIGH (ref 70–99)
Glucose-Capillary: 128 mg/dL — ABNORMAL HIGH (ref 70–99)
Glucose-Capillary: 133 mg/dL — ABNORMAL HIGH (ref 70–99)

## 2013-11-09 LAB — BASIC METABOLIC PANEL
BUN: 12 mg/dL (ref 6–23)
CO2: 25 mEq/L (ref 19–32)
Calcium: 6.2 mg/dL — CL (ref 8.4–10.5)
Chloride: 99 mEq/L (ref 96–112)
Creatinine, Ser: 0.41 mg/dL — ABNORMAL LOW (ref 0.50–1.10)
Glucose, Bld: 110 mg/dL — ABNORMAL HIGH (ref 70–99)
POTASSIUM: 3.6 meq/L — AB (ref 3.7–5.3)
SODIUM: 133 meq/L — AB (ref 137–147)

## 2013-11-10 ENCOUNTER — Telehealth: Payer: Self-pay | Admitting: Oncology

## 2013-11-10 ENCOUNTER — Ambulatory Visit: Payer: Medicare Other | Admitting: Oncology

## 2013-11-10 ENCOUNTER — Encounter: Payer: Medicare Other | Admitting: Nutrition

## 2013-11-10 ENCOUNTER — Ambulatory Visit: Payer: Medicare Other

## 2013-11-10 ENCOUNTER — Other Ambulatory Visit: Payer: Medicare Other

## 2013-11-10 NOTE — Telephone Encounter (Signed)
Medical Oncology  Spoke with Dr Benson Norway directly to let him know that patient died last pm while hospitalized at [facility], where she had been doing very poorly from metastatic pancreatic cancer, but seems to have been sudden event. Expressed appreciation for all of his care. Message to other physicians involved also. Godfrey Pick, MD

## 2013-11-10 NOTE — Telephone Encounter (Signed)
Medical Oncology  Spoke with mother now, as patient died last pm at [facility]. Mother appreciative of care.  Godfrey Pick, MD

## 2013-11-10 NOTE — Progress Notes (Signed)
@  1940 11/09/2013 pt . Breathless/pulseless...pronounced deceased by Gonzella Lex and Pasty Arch. MD and family notified

## 2013-11-11 LAB — BODY FLUID CULTURE: Culture: NO GROWTH

## 2013-11-14 ENCOUNTER — Encounter: Payer: Self-pay | Admitting: Internal Medicine

## 2013-11-20 NOTE — Discharge Summary (Addendum)
Death Summary  [name] NWG:[id number] DOB: 1966/02/19 DOA: November 18, 2013  PCP: [name], MD PCP/Office notified:   Admit date: November 18, 2013 Date of Death: 2013/11/21  Final Diagnoses:  Active Problems:   Graves' disease   Hypokalemia   Multiple sclerosis   Carcinoma of distal third of esophagus   Protein-calorie malnutrition, severe   GI bleed   Cellulitis of toes, multiple, right   Tobacco use disorder   Pleural effusion   Cough   Acute blood loss anemia Hyponatremia likely secondary dehydration Hypocalcemia likely secondary to Hypoalbuminemia in setting of severe protein calorie deficiency   History of present illness:  48 year old female with a history of distal esophageal adenocarcinoma diagnosed in September 2014. Status post is radiation therapy to distal esophagus. Currently undergoing chemotherapy. Started FOLFOX in palliative attempt in early Dec 2014 after jejunostomy placement and tube feedings tolerated. She has history of chronic pancreatitis and substance abuse, already on methadone 30 mg tid when the esophageal cancer was diagnosed. Code status is DNR , being admitted for rectal bleeding that started at 5 AM this morning. She complains of mild epigastric pain. Patient has porta cath and PEG tube. She is on Osmolite tube feeds. She complains of mild tenderness around the entrance of her PEG tube. No reported fever.  Hospital Course:  Patient is an unfortunate 48 year old female with a past medical history of metastatic adenocarcinoma of distal esophagus/GE junction with metastasis to bone, having a metastatic lesions 2 pancreas. She underwent palliative chemotherapy with 2 cycles of FOLFOX. She presented on 18-Nov-2013 with complaints of bright red blood per rectum. She was transfused with 2 units of packed red blood cells admitted to the medicine service. Gastroenterology and medical oncology consulted. Lab work showed pleural effusion for which she underwent  ultrasound-guided thoracentesis of left pleural effusion on 11/07/2013, with removal of 1.4 L of yellow fluid. No organisms were seen on Gram stain. Dr.Livesay evaluated patient during this hospitalization. As if she was found to have new metastatic lesion involving pancreas is the September 2014 study, options were discussed with patient. It was felt that given her advanced stage, pursuing further chemotherapy would likely cause more harm than good. Comfort care was recommended. Palliative care was consulted to assist with care planning and a dressing goals of care. Patient's CODE STATUS had been a DO NOT RESUSCITATE. Patient expired at [place] on November 21, 2013.   Time: 15 minutes  Signed:  Kelvin Cellar  [facility] 11/20/2013, 3:01 PM

## 2013-12-07 NOTE — Progress Notes (Signed)
[facility] PROGRESS NOTE  [name] FGH:[id number] DOB: 01/18/1966 DOA: 11/08/2013 PCP: [name], MD  Assessment/Plan: 1. 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 Status post palliative chemotherapy with FOLFOX. Her oncologist Dr Marko Plume as evaluated patient during this hospitalization. He was found to have a new metastatic lesion to pancreas since September 2014 study. Conscious were discussed with patient as it was felt that pursuing further chemotherapy would likely cause more harm than good. Comfort care with hospice is felt to be appropriate at this point in time. Palliative care consultation placed. 2. Acute blood loss anemia secondary to lower GI bleed. Status post transfusion with 2 units of packed red blood cells, with a.m. lab work showing a hemoglobin of 10.1 with hematocrit of 30.2. 3. Hematochezia. Patient has not had further episodes of GI bleed. She was seen and evaluated by gastroenterology. Continue supportive care.  4. Pleural effusion. Patient undergoing ultrasound-guided thoracentesis of left pleural effusion on 11/07/2013 with removal of 1.4 L of yellow fluid. No organisms were seen on Gram stain, fluid cultures remaining negative. 5. Hypokalemia. Patient's potassium level at 2.9 on 11/08/2013, was administered IV potassium, with improved to potassium level on this mornings lab work at 3.6. 6. Goals of care. Patient having advanced metastatic adenocarcinoma, poor prognosis, not candidate for further chemotherapy. Consulted palliative care. At this point it would seem reasonable to focus care on comfort and quality of life rather than pursuing further invasive/burdensome procedures.   Code Status: DNR Disposition Plan: Palliative Care Consult   Consultants:  Gastroenterology  Medical oncology  Procedures:  Ultrasound-guided thoracentesis performed on 11/07/2013  with removal of 1.4 L of fluid   HPI/Subjective: Patient is an unfortunate 48 year old female with a past medical history of metastatic adenocarcinoma of distal esophagus/GE junction, with metastases to bone, and new metastatic lesion at pancreas, undergoing palliative chemotherapy with 2 cycles of FOLFOX. She has been seen and evaluated by gastroenterology and oncology during this hospitalization. The risks of continuing chemotherapy outweigh benefits as hematology/oncology recommending transitioning to palliative care. Patient states abdominal pain controlled with IV narcotics.  Objective: Filed Vitals:   11/21/2013 1338  BP: 104/60  Pulse: 113  Temp: 98.8 F (37.1 C)  Resp: 20    Intake/Output Summary (Last 24 hours) at 11/30/2013 1828 Last data filed at 11/08/13 2200  Gross per 24 hour  Intake      0 ml  Output    200 ml  Net   -200 ml   Filed Weights   11/13/2013 1406 11/27/2013 1954 11/08/13 1933  Weight: 51.256 kg (113 lb) 53.4 kg (117 lb 11.6 oz) 55.9 kg (123 lb 3.8 oz)    Exam:   General:  Cachectic, ill-appearing, no acute distress  Cardiovascular: Tachycardic regular rate rhythm  Respiratory: Coarse respiratory sounds with extensive rales and rhonchi  Abdomen: Status post J-tube placement, mild tenderness to palpation  Musculoskeletal: Muscle atrophy   Data Reviewed: Basic Metabolic Panel:  Recent Labs Lab 11/03/13 1119 11/27/2013 1604 11/07/13 0553 11/08/13 0500 12/05/2013 0520  NA 134* 134* 136* 133* 133*  K 4.1 3.8 3.4* 2.9* 3.6*  CL  --  97 98 97 99  CO2 27 26 24 23 25   GLUCOSE 83 94 65* 86 110*  BUN 13.7 24* 14 10 12   CREATININE 0.4* 0.35* 0.29* 0.38* 0.41*  CALCIUM 8.2* 7.1* 6.8* 5.9* 6.2*  MG  --   --  2.1 2.0  --  Liver Function Tests:  Recent Labs Lab 11/03/13 1119 11/30/2013 1604 11/07/13 0553  AST 29 43* 45*  ALT 24 20 19   ALKPHOS 240* 169* 154*  BILITOT 0.22 <0.2* 0.4  PROT 5.7* 5.5* 5.5*  ALBUMIN 1.8* 1.8* 1.8*    Recent  Labs Lab 11/21/2013 1605  LIPASE 39   No results found for this basename: AMMONIA,  in the last 168 hours CBC:  Recent Labs Lab 11/03/13 1118 12/02/2013 1604 11/07/13 0553 11/07/13 0735 11/08/13 0500 December 05, 2013 0520  WBC 5.5 5.0 9.0 10.1 7.7 10.2  NEUTROABS 4.0 4.2  --  9.5*  --   --   HGB 10.3* 7.2* 11.7* 11.4* 9.9* 10.1*  HCT 32.5* 21.9* 34.3* 33.7* 29.4* 30.2*  MCV 95.0 92.4 90.3 89.9 89.6 90.4  PLT 594* PLATELET CLUMPS NOTED ON SMEAR, COUNT APPEARS ADEQUATE 334 377 324 312   Cardiac Enzymes:  Recent Labs Lab 11/07/13 0553 11/07/13 0735  TROPONINI <0.30 <0.30   BNP (last 3 results)  Recent Labs  07/29/13 2104  PROBNP 74.9   CBG:  Recent Labs Lab 2013/12/05 0007 05-Dec-2013 0413 12-05-2013 0744 2013-12-05 1148 2013-12-05 1556  GLUCAP 133* 118* 128* 147* 125*    Recent Results (from the past 240 hour(s))  BODY FLUID CULTURE     Status: None   Collection Time    11/07/13  2:18 PM      Result Value Range Status   Specimen Description PLEURAL LEFT FLUID   Final   Special Requests NONE   Final   Gram Stain     Final   Value: RARE WBC PRESENT, PREDOMINANTLY PMN     NO ORGANISMS SEEN     Performed at [facility]   Culture     Final   Value: NO GROWTH 2 DAYS     Performed at [facility]   Report Status PENDING   Incomplete     Studies: No results found.  Scheduled Meds: . feeding supplement (OSMOLITE 1.2 CAL)  1,000 mL Per Tube Q24H  . levothyroxine  125 mcg Per Tube Q0600  . methadone  30 mg Per Tube Q8H  . nicotine  14 mg Transdermal Daily  . pantoprazole sodium  40 mg Per Tube BID  . sodium chloride  1,000 mL Intravenous Once  . sucralfate  1 g Oral TID WC & HS  . vancomycin  1,500 mg Intravenous Q24H   Continuous Infusions:   Active Problems:   Graves' disease   Hypokalemia   Multiple sclerosis   Carcinoma of distal third of esophagus   Protein-calorie malnutrition, severe   GI bleed   Cellulitis of toes, multiple, right    Tobacco use disorder   Pleural effusion   Cough   Acute blood loss anemia    Time spent: 35 minutes    Kelvin Cellar  [facility] Pager [phone]. If 7PM-7AM, please contact night-coverage at [email or web address], password [facility] 12/05/2013, 6:28 PM  LOS: 3 days

## 2013-12-07 DEATH — deceased

## 2014-07-03 ENCOUNTER — Other Ambulatory Visit: Payer: Self-pay | Admitting: Pharmacist

## 2016-12-07 DEATH — deceased

## 2023-01-18 ENCOUNTER — Other Ambulatory Visit: Payer: Self-pay | Admitting: Medical Specialty

## 2023-02-15 ENCOUNTER — Encounter: Payer: Self-pay | Admitting: Rehabilitative and Restorative Service Providers"

## 2023-03-23 ENCOUNTER — Encounter: Payer: Self-pay | Admitting: Rehabilitative and Restorative Service Providers"

## 2023-03-23 ENCOUNTER — Inpatient Hospital Stay
Payer: BC Managed Care – PPO | Attending: Orthopaedic Surgery | Admitting: Rehabilitative and Restorative Service Providers"

## 2023-03-23 VITALS — BP 129/90 | HR 77

## 2023-03-23 DIAGNOSIS — M25562 Pain in left knee: Secondary | ICD-10-CM | POA: Insufficient documentation

## 2023-03-23 NOTE — Progress Notes (Incomplete)
Name:Mary Richmond Age: 57 y.o.   Date of Service: 03/23/2023  Referring Physician:     Date of Injury: No data found No data found  PT Date Care Plan Established/Reviewed:No data found  PT Date Treatment Started:No data found  OT Date Care Plan Established/Reviewed: No data found  OT Date Treatment Started: No data found    (Historic) Date of Injury:No data was found  (Historic) Date Care Plan Established/Reviewed No data was found  No data was found  (Historic) Date Treatment Started No data was found No data was found    End of Certification Date: No data was found  Sessions in Plan of Care: No data was found  Surgery Date: No data was found  MD Follow-up: No data was found  Medbridge Code: No data was found    Visit Count: 1   Diagnosis:    Diagnosis ICD-10-CM Associated Order   1. Left knee pain, unspecified chronicity  M25.562           Subjective     History of Present Illness   History of Present Illness: L knee pain - started   Woke up in sever - ER- XR negative - gave her a brace - no recommendations  Again ER in April - XR - bone to bone  - referred to Dr Odie Sera   Referred to PT - gave pt an injection -has been pain free  since then.   Functional Limitations (PLOF):   Gym     Daily Subjective     No pain   Here because doctors recommendations    Social Support/Occupation                          Precautions: No data was found  Allergies: Patient has no allergy information on record.    No past medical history on file.    Objective     Posture     Ankle/Foot   Ankle/Foot (Left): Pronated.   Ankle/Foot (Right): Pronated.     Comments   Baker cysrt      Balance   Left   Eyes Open (Left)(sec)      Trial 1: 60 seconds  Right   Eyes Open (Right)(sec)      Trial 1: 60 seconds    Strength/Myotome Testing (/5)     Left Hip   Planes of Motion   Extension: 4+  Abduction: 4+    Right Hip   Planes of Motion   Extension: 4  Abduction: 3+                            Assessment   Functional Limitations (PLOF):   Gym          Drexel Iha, PT

## 2023-03-26 NOTE — Progress Notes (Addendum)
Name:Mary Richmond Age: 57 y.o.   Date of Service: 03/23/2023  Referring Physician: Stephens November*   Date of Injury: No data found 02/15/2023  PT Date Care Plan Established/Reviewed:03/23/2023  PT Date Treatment Started:03/23/2023  OT Date Care Plan Established/Reviewed: No data found  OT Date Treatment Started: No data found    (Historic) Date of Injury:No data was found  (Historic) Date Care Plan Established/Reviewed No data was found  No data was found  (Historic) Date Treatment Started No data was found No data was found    End of Certification Date: 06/20/2023  Sessions in Plan of Care: 8  Surgery Date: No data was found  MD Follow-up: No data was found  Medbridge Code: No data was found    Discontinuation of Therapy Services    Mary Richmond did not complete prescribed physical therapy visits.      Status is unknown at this time, physical therapy has been discontinued and patient has been discharged from care.  The last therapy note is below for review.    Please feel free to contact me with any questions regarding the care of Mary Richmond.    Sincerely,    Drexel Iha, PT        06/25/2023               Visit Count: 1   Diagnosis:    Diagnosis ICD-10-CM Associated Order   1. Left knee pain, unspecified chronicity  M25.562       Patient arrived 15 minutes late to her evaluation- Eval only     Subjective     History of Present Illness   History of Present Illness: Patient states she had a recent episode of left knee pain without clear cause.  Saw Dr. Dallas Breeding on 02/15/2023.  She got a cortisone injection in her left knee and reports 100% resolution of her pain.       Functional Limitations (PLOF):  Patient currently not using her gym at home due to recent episode of left knee pain.    Daily Subjective   Patient denies any pain in her left knee since she got the cortisone injection.  Complains of discomfort with palpation on the left patellofemoral joint.    Pain   Current pain rating: 0  Location: L  knee    Social Support/Occupation    Lives in: multiple level home         Occupation: sales manage stands 8 hours           Precautions: No data was found  Allergies: Patient has no known allergies.    History reviewed. No pertinent past medical history.    Objective     Posture     Ankle/Foot   Ankle/Foot (Left): Pronated.   Ankle/Foot (Right): Pronated.     Functional Strength   Sit to Stand: independent  Squat: full  Heel Raises      Unilateral (Left): 25     Unilateral (Right): 25    Balance   Left   Eyes Open (Left)(sec)      Trial 1: 60 seconds  Right   Eyes Open (Right)(sec)      Trial 1: 60 seconds    Gait   Within Functional Limits    Integumentary   no wound, lesion or rash noted    Active Range of Motion (degrees)     Left Knee   Flexion: WFL and with pain  Extension: [facility]  Right Knee   Flexion: WFL  Extension: [facility]    Strength/Myotome Testing (/5)     Left Hip   Planes of Motion   Extension: 4+  Abduction: 4+    Right Hip   Planes of Motion   Extension: 4  Abduction: 3+    Left Knee   Prone flexion: 4  Extension (L3): 4+    Right Knee   Prone flexion: 4+  Extension (L3): 4+    Palpation TTP on left patellofemoral joint  Patient very apprehensive of gentle but there are mobs    Flexibility   Hamstrings: -40 degrees bilaterally    Tests     Knee Tests Comments: L knee + Clarke test ( +) grinding test       Neurological Testing     Sensation     Knee   Left Knee   Intact: Light touch    Right Knee   Intact: light touch             Patient presents with area of swelling on the posterior lateral aspect of her left knee (popliteal cyst?)      BP: 129/90 Heart Rate: 77 (77)          ---      Flowsheet Row ---   Total Time    Untimed Minutes 30 minutes   Total Time 30 minutes          Assessment   Mary Richmond is a 57 y.o. female presenting with Hx of L knee pain  who requires skilled Physical Therapy services.    Clinical presentation: stable - local pain without causing other issues/symptoms  Barriers to therapy:      Functional Limitations (PLOF):  Patient currently not using her gym at home due to recent episode of left knee pain.  Prognosis: good  Patient is aware of diagnosis, prognosis and consents to plan of care: Yes  Plan   Visits per week: 1  Number of Sessions: 8  Direct One on One  11914: Therapeutic Exercise: To Develop Strength and Endurance, ROM and Flexibility  [id number]: Gait Training  78295: Neuromuscular Reeducation (Proprioceptive Neuromuscular Facilitation)  97140: Manual Therapy techniques (mobilization, manipulation, manual traction) (Grade I-V to patellofemoral joint, tibiofemoral joint, ankle, hip, and regionally interdependent joints, soft tissue mobilization, instrument assisted soft tissue mobilization.)  97530: Therapeutic Activities: Dynamic activities to improve functional performance  Dry Needling  Plan for Next Session -: collect FOTO      Goals      Goal 1: Patient will demonstrate independence in prescribed HEP with proper form, sets and reps for safe discharge to an independent program.   Sessions: 8      Goal 2: Increase L hip abduction  strength to improve LE alignment and decreased abnormal forces to L knee.    Sessions: 8      Goal 3: Increase hamstring flexibility by minimal 50% to decrease abnormal stress forces to patellofemoral joint.   Sessions: 8      Goal 4: Increase knee ext strength 5/5 to improve patellar control and decrease grinding of the knee   Sessions: 8                                  Drexel Iha, PT

## 2023-03-27 ENCOUNTER — Inpatient Hospital Stay: Payer: BC Managed Care – PPO | Admitting: Rehabilitative and Restorative Service Providers"

## 2023-03-30 ENCOUNTER — Inpatient Hospital Stay: Payer: BC Managed Care – PPO

## 2023-04-05 ENCOUNTER — Inpatient Hospital Stay: Payer: BC Managed Care – PPO | Admitting: Rehabilitative and Restorative Service Providers"

## 2023-04-10 ENCOUNTER — Inpatient Hospital Stay: Payer: BC Managed Care – PPO

## 2023-04-12 ENCOUNTER — Inpatient Hospital Stay: Payer: BC Managed Care – PPO

## 2023-04-17 ENCOUNTER — Inpatient Hospital Stay: Payer: BC Managed Care – PPO

## 2023-04-19 ENCOUNTER — Inpatient Hospital Stay: Payer: BC Managed Care – PPO | Admitting: Rehabilitative and Restorative Service Providers"

## 2024-04-09 ENCOUNTER — Other Ambulatory Visit: Payer: Self-pay | Admitting: Medical Specialty

## 2024-09-05 ENCOUNTER — Other Ambulatory Visit: Payer: Self-pay | Admitting: Urology
